# Patient Record
Sex: Female | Born: 1935
Health system: Southern US, Community
[De-identification: ages and names within clinical notes are randomized; demographics above are authoritative.]

## PROBLEM LIST (undated history)

## (undated) DIAGNOSIS — G4700 Insomnia, unspecified: Secondary | ICD-10-CM

## (undated) DIAGNOSIS — E049 Nontoxic goiter, unspecified: Secondary | ICD-10-CM

## (undated) DIAGNOSIS — J302 Other seasonal allergic rhinitis: Secondary | ICD-10-CM

## (undated) DIAGNOSIS — K8020 Calculus of gallbladder without cholecystitis without obstruction: Secondary | ICD-10-CM

## (undated) DIAGNOSIS — F329 Major depressive disorder, single episode, unspecified: Secondary | ICD-10-CM

## (undated) DIAGNOSIS — F32A Depression, unspecified: Secondary | ICD-10-CM

## (undated) DIAGNOSIS — M8580 Other specified disorders of bone density and structure, unspecified site: Secondary | ICD-10-CM

## (undated) DIAGNOSIS — E785 Hyperlipidemia, unspecified: Secondary | ICD-10-CM

## (undated) DIAGNOSIS — K7689 Other specified diseases of liver: Secondary | ICD-10-CM

## (undated) DIAGNOSIS — I10 Essential (primary) hypertension: Secondary | ICD-10-CM

## (undated) DIAGNOSIS — Z872 Personal history of diseases of the skin and subcutaneous tissue: Secondary | ICD-10-CM

## (undated) DIAGNOSIS — D649 Anemia, unspecified: Secondary | ICD-10-CM

## (undated) DIAGNOSIS — E538 Deficiency of other specified B group vitamins: Secondary | ICD-10-CM

## (undated) DIAGNOSIS — G3184 Mild cognitive impairment, so stated: Secondary | ICD-10-CM

## (undated) DIAGNOSIS — E46 Unspecified protein-calorie malnutrition: Secondary | ICD-10-CM

## (undated) DIAGNOSIS — J449 Chronic obstructive pulmonary disease, unspecified: Secondary | ICD-10-CM

## (undated) DIAGNOSIS — E039 Hypothyroidism, unspecified: Secondary | ICD-10-CM

## (undated) DIAGNOSIS — K56609 Unspecified intestinal obstruction, unspecified as to partial versus complete obstruction: Secondary | ICD-10-CM

## (undated) DIAGNOSIS — Z8719 Personal history of other diseases of the digestive system: Secondary | ICD-10-CM

## (undated) DIAGNOSIS — K8000 Calculus of gallbladder with acute cholecystitis without obstruction: Principal | ICD-10-CM

## (undated) HISTORY — DX: Chronic obstructive pulmonary disease, unspecified: J44.9

## (undated) HISTORY — DX: Deficiency of other specified B group vitamins: E53.8

## (undated) HISTORY — DX: Calculus of gallbladder without cholecystitis without obstruction: K80.20

## (undated) HISTORY — PX: DILATION AND CURETTAGE OF UTERUS: SHX78

## (undated) HISTORY — DX: Nontoxic goiter, unspecified: E04.9

## (undated) HISTORY — PX: CATARACT EXTRACTION W/ INTRAOCULAR LENS IMPLANT: SHX1309

## (undated) HISTORY — DX: Anemia, unspecified: D64.9

## (undated) HISTORY — DX: Unspecified protein-calorie malnutrition: E46

## (undated) HISTORY — DX: Unspecified intestinal obstruction, unspecified as to partial versus complete obstruction: K56.609

## (undated) HISTORY — DX: Mild cognitive impairment of uncertain or unknown etiology: G31.84

## (undated) HISTORY — DX: Personal history of diseases of the skin and subcutaneous tissue: Z87.2

## (undated) HISTORY — DX: Insomnia, unspecified: G47.00

## (undated) HISTORY — DX: Other specified disorders of bone density and structure, unspecified site: M85.80

## (undated) HISTORY — DX: Other specified diseases of liver: K76.89

---

## 2011-05-08 ENCOUNTER — Emergency Department (HOSPITAL_COMMUNITY)
Admission: EM | Admit: 2011-05-08 | Discharge: 2011-05-09 | Disposition: A | Discharge status: Home / Self Care | Payer: Self-pay | Attending: Emergency Medicine | Admitting: Emergency Medicine

## 2011-05-08 DIAGNOSIS — Y92009 Unspecified place in unspecified non-institutional (private) residence as the place of occurrence of the external cause: Secondary | ICD-10-CM | POA: Insufficient documentation

## 2011-05-08 DIAGNOSIS — W010XXA Fall on same level from slipping, tripping and stumbling without subsequent striking against object, initial encounter: Secondary | ICD-10-CM | POA: Insufficient documentation

## 2011-05-08 DIAGNOSIS — I10 Essential (primary) hypertension: Secondary | ICD-10-CM | POA: Insufficient documentation

## 2011-05-08 DIAGNOSIS — Z79899 Other long term (current) drug therapy: Secondary | ICD-10-CM | POA: Insufficient documentation

## 2011-05-08 DIAGNOSIS — S51809A Unspecified open wound of unspecified forearm, initial encounter: Secondary | ICD-10-CM | POA: Insufficient documentation

## 2012-12-16 HISTORY — PX: ABCESS DRAINAGE: SHX399

## 2012-12-16 HISTORY — PX: COLON RESECTION: SHX5231

## 2013-09-15 DIAGNOSIS — Z8719 Personal history of other diseases of the digestive system: Secondary | ICD-10-CM

## 2013-09-15 HISTORY — DX: Personal history of other diseases of the digestive system: Z87.19

## 2013-10-01 ENCOUNTER — Encounter: Payer: Self-pay | Admitting: Endocrinology

## 2013-10-01 ENCOUNTER — Inpatient Hospital Stay (HOSPITAL_COMMUNITY)
Admission: AD | Admit: 2013-10-01 | Discharge: 2013-10-12 | DRG: 391 | Disposition: A | Discharge status: Home Health | Payer: BC Managed Care – PPO | Source: Ambulatory Visit | Attending: Endocrinology | Admitting: Endocrinology

## 2013-10-01 ENCOUNTER — Other Ambulatory Visit: Payer: Self-pay | Admitting: Endocrinology

## 2013-10-01 ENCOUNTER — Ambulatory Visit
Admission: RE | Admit: 2013-10-01 | Discharge: 2013-10-01 | Disposition: A | Discharge status: Home / Self Care | Payer: Medicare Other | Source: Ambulatory Visit | Attending: Endocrinology | Admitting: Endocrinology

## 2013-10-01 DIAGNOSIS — I959 Hypotension, unspecified: Secondary | ICD-10-CM | POA: Diagnosis not present

## 2013-10-01 DIAGNOSIS — K8000 Calculus of gallbladder with acute cholecystitis without obstruction: Secondary | ICD-10-CM | POA: Insufficient documentation

## 2013-10-01 DIAGNOSIS — R197 Diarrhea, unspecified: Secondary | ICD-10-CM | POA: Diagnosis not present

## 2013-10-01 DIAGNOSIS — K630 Abscess of intestine: Secondary | ICD-10-CM | POA: Diagnosis present

## 2013-10-01 DIAGNOSIS — F411 Generalized anxiety disorder: Secondary | ICD-10-CM | POA: Diagnosis present

## 2013-10-01 DIAGNOSIS — R634 Abnormal weight loss: Secondary | ICD-10-CM | POA: Insufficient documentation

## 2013-10-01 DIAGNOSIS — R109 Unspecified abdominal pain: Secondary | ICD-10-CM

## 2013-10-01 DIAGNOSIS — K7689 Other specified diseases of liver: Secondary | ICD-10-CM

## 2013-10-01 DIAGNOSIS — E871 Hypo-osmolality and hyponatremia: Secondary | ICD-10-CM | POA: Diagnosis present

## 2013-10-01 DIAGNOSIS — F1010 Alcohol abuse, uncomplicated: Secondary | ICD-10-CM | POA: Diagnosis present

## 2013-10-01 DIAGNOSIS — K5732 Diverticulitis of large intestine without perforation or abscess without bleeding: Secondary | ICD-10-CM

## 2013-10-01 DIAGNOSIS — I10 Essential (primary) hypertension: Secondary | ICD-10-CM | POA: Diagnosis present

## 2013-10-01 DIAGNOSIS — K5740 Diverticulitis of both small and large intestine with perforation and abscess without bleeding: Secondary | ICD-10-CM | POA: Diagnosis present

## 2013-10-01 DIAGNOSIS — E876 Hypokalemia: Secondary | ICD-10-CM | POA: Diagnosis present

## 2013-10-01 DIAGNOSIS — R627 Adult failure to thrive: Secondary | ICD-10-CM | POA: Diagnosis present

## 2013-10-01 DIAGNOSIS — K5720 Diverticulitis of large intestine with perforation and abscess without bleeding: Secondary | ICD-10-CM | POA: Diagnosis present

## 2013-10-01 DIAGNOSIS — E43 Unspecified severe protein-calorie malnutrition: Secondary | ICD-10-CM | POA: Diagnosis present

## 2013-10-01 DIAGNOSIS — D649 Anemia, unspecified: Secondary | ICD-10-CM | POA: Diagnosis present

## 2013-10-01 DIAGNOSIS — E785 Hyperlipidemia, unspecified: Secondary | ICD-10-CM | POA: Diagnosis present

## 2013-10-01 DIAGNOSIS — G4700 Insomnia, unspecified: Secondary | ICD-10-CM | POA: Diagnosis present

## 2013-10-01 DIAGNOSIS — E039 Hypothyroidism, unspecified: Secondary | ICD-10-CM | POA: Diagnosis present

## 2013-10-01 DIAGNOSIS — A498 Other bacterial infections of unspecified site: Secondary | ICD-10-CM | POA: Diagnosis present

## 2013-10-01 DIAGNOSIS — N83209 Unspecified ovarian cyst, unspecified side: Secondary | ICD-10-CM | POA: Diagnosis present

## 2013-10-01 DIAGNOSIS — F17200 Nicotine dependence, unspecified, uncomplicated: Secondary | ICD-10-CM | POA: Diagnosis present

## 2013-10-01 DIAGNOSIS — N730 Acute parametritis and pelvic cellulitis: Secondary | ICD-10-CM

## 2013-10-01 DIAGNOSIS — K8010 Calculus of gallbladder with chronic cholecystitis without obstruction: Secondary | ICD-10-CM | POA: Diagnosis present

## 2013-10-01 DIAGNOSIS — Z681 Body mass index (BMI) 19 or less, adult: Secondary | ICD-10-CM

## 2013-10-01 DIAGNOSIS — E049 Nontoxic goiter, unspecified: Secondary | ICD-10-CM | POA: Insufficient documentation

## 2013-10-01 HISTORY — DX: Hyperlipidemia, unspecified: E78.5

## 2013-10-01 HISTORY — DX: Other seasonal allergic rhinitis: J30.2

## 2013-10-01 HISTORY — DX: Hypothyroidism, unspecified: E03.9

## 2013-10-01 HISTORY — DX: Other specified diseases of liver: K76.89

## 2013-10-01 HISTORY — DX: Calculus of gallbladder with acute cholecystitis without obstruction: K80.00

## 2013-10-01 LAB — COMPREHENSIVE METABOLIC PANEL
ALT: 23 U/L (ref 0–35)
AST: 32 U/L (ref 0–37)
Albumin: 2.1 g/dL — ABNORMAL LOW (ref 3.5–5.2)
Alkaline Phosphatase: 72 U/L (ref 39–117)
BUN: 10 mg/dL (ref 6–23)
CO2: 24 mEq/L (ref 19–32)
Calcium: 8 mg/dL — ABNORMAL LOW (ref 8.4–10.5)
Chloride: 91 mEq/L — ABNORMAL LOW (ref 96–112)
Creatinine, Ser: 0.53 mg/dL (ref 0.50–1.10)
GFR calc Af Amer: >90 mL/min (ref >90)
GFR calc non Af Amer: 90 mL/min — ABNORMAL LOW (ref >90)
Glucose, Bld: 78 mg/dL (ref 70–99)
Potassium: 2.6 mEq/L — CL (ref 3.5–5.1)
Sodium: 131 mEq/L — ABNORMAL LOW (ref 135–145)
Total Bilirubin: 0.6 mg/dL (ref 0.3–1.2)
Total Protein: 5.6 g/dL — ABNORMAL LOW (ref 6.0–8.3)

## 2013-10-01 LAB — CBC
HCT: 32.6 % — ABNORMAL LOW (ref 36.0–46.0)
Hemoglobin: 11.5 g/dL — ABNORMAL LOW (ref 12.0–15.0)
MCH: 34 pg (ref 26.0–34.0)
MCHC: 35.3 g/dL (ref 30.0–36.0)
MCV: 96.4 fL (ref 78.0–100.0)
Platelets: 328 10*3/uL (ref 150–400)
RBC: 3.38 MIL/uL — ABNORMAL LOW (ref 3.87–5.11)
RDW: 12.9 % (ref 11.5–15.5)
WBC: 9.3 10*3/uL (ref 4.0–10.5)

## 2013-10-01 MED ORDER — IOHEXOL 300 MG/ML  SOLN
100.0000 mL | Freq: Once | INTRAMUSCULAR | Status: AC | PRN
  Administered 2013-10-01: 100 mL via INTRAVENOUS

## 2013-10-01 MED ORDER — TEMAZEPAM 7.5 MG PO CAPS
7.5000 mg | ORAL_CAPSULE | Freq: Every evening | ORAL | Status: AC | PRN
  Administered 2013-10-01 – 2013-10-02 (×2): 7.5 mg via ORAL
  Filled 2013-10-01 (×2): qty 1

## 2013-10-01 MED ORDER — BUSPIRONE HCL 10 MG PO TABS
10.0000 mg | ORAL_TABLET | Freq: Two times a day (BID) | ORAL | Status: DC
  Administered 2013-10-01 – 2013-10-11 (×20): 10 mg via ORAL
  Filled 2013-10-01 (×28): qty 1

## 2013-10-01 MED ORDER — POTASSIUM CHLORIDE 10 MEQ/100ML IV SOLN
10.0000 meq | INTRAVENOUS | Status: AC
  Administered 2013-10-01 (×3): 10 meq via INTRAVENOUS
  Filled 2013-10-01 (×3): qty 100

## 2013-10-01 MED ORDER — PIPERACILLIN-TAZOBACTAM 3.375 G IVPB
3.3750 g | Freq: Three times a day (TID) | INTRAVENOUS | Status: DC
  Administered 2013-10-01 – 2013-10-07 (×18): 3.375 g via INTRAVENOUS
  Filled 2013-10-01 (×19): qty 50

## 2013-10-01 MED ORDER — IOHEXOL 300 MG/ML  SOLN
40.0000 mL | Freq: Once | INTRAMUSCULAR | Status: AC | PRN
  Administered 2013-10-01: 40 mL via ORAL

## 2013-10-01 MED ORDER — SODIUM CHLORIDE 0.9 % IV SOLN
INTRAVENOUS | Status: DC
  Administered 2013-10-01 – 2013-10-04 (×6): via INTRAVENOUS
  Administered 2013-10-05: 20 mL/h via INTRAVENOUS
  Administered 2013-10-08 – 2013-10-09 (×5): via INTRAVENOUS

## 2013-10-01 MED ORDER — LEVOTHYROXINE SODIUM 75 MCG PO TABS
75.0000 ug | ORAL_TABLET | Freq: Every day | ORAL | Status: DC
  Administered 2013-10-02 – 2013-10-12 (×11): 75 ug via ORAL
  Filled 2013-10-01 (×13): qty 1

## 2013-10-01 MED ORDER — LOSARTAN POTASSIUM 50 MG PO TABS
50.0000 mg | ORAL_TABLET | Freq: Every day | ORAL | Status: DC
  Administered 2013-10-02 – 2013-10-06 (×5): 50 mg via ORAL
  Filled 2013-10-01 (×7): qty 1

## 2013-10-01 MED ORDER — ZOLPIDEM TARTRATE 5 MG PO TABS: 5.0000 mg | ORAL_TABLET | Freq: Every evening | ORAL | Status: DC | PRN

## 2013-10-01 NOTE — Progress Notes (Signed)
Arrived to room 6n12 by wc with husband. A/Ox4, denies pain/nausea, oriented to room and surroundings.

## 2013-10-01 NOTE — Progress Notes (Signed)
CRITICAL VALUE ALERT  Critical value received:  K+ 2.6  Date of notification:  10/01/2013  Time of notification:  1925  Critical value read back: yes  Nurse who received alert:  Geannie Risen  MD notified (1st page):  Dr. Larina Earthly  Time of first page:  1930  MD notified (2nd page):  Time of second page:  Responding MD:  Dr. Larina Earthly  Time MD responded:  [phone]

## 2013-10-01 NOTE — Consult Note (Signed)
Reason for Consult:pelvic abscess, gallbladder disease Referring Physician: Dr. Rinaldo Cloud  [name] is an 78 y.o. female.  HPI: I have been asked to evaluate this patient after a CAT scan of the abdomen and pelvis demonstrated multiple pelvic abscesses as well as findings and the gallbladder worrisome for gallbladder cancer. The patient has felt poorly over the last 3 weeks. She's had constant mild sharp abdominal pain across her lower abdomen. This came on gradually. She has had some nausea but no emesis. She is passing flatus and moving her bowels. According to her and her husband, she has felt poorly for almost a year. She originally lost a large amount of weight but has been slowly over time able to regain the weight. She has no prior history of diverticulitis. She has never had a colonoscopy per her report. She denies fevers or chills.  No past medical history on file.  No past surgical history on file.No past surgical history  No family history on file.  Social History:  has no tobacco, alcohol, and drug history on file.  Allergies:  Allergies  Allergen Reactions  . Cymbalta [Duloxetine Hcl] Other (See Comments)    "kinda went nuts"  . Halcion [Triazolam] Other (See Comments)    "kinda went nuts"    Medications: I have reviewed the patient's current medications.  Results for orders placed during the hospital encounter of 10/01/13 (from the past 48 hour(s))  CBC     Status: Abnormal   Collection Time    10/01/13  6:28 PM      Result Value Range   WBC 9.3  4.0 - 10.5 K/uL   RBC 3.38 (*) 3.87 - 5.11 MIL/uL   Hemoglobin 11.5 (*) 12.0 - 15.0 g/dL   HCT 16.1 (*) 09.6 - 04.5 %   MCV 96.4  78.0 - 100.0 fL   MCH 34.0  26.0 - 34.0 pg   MCHC 35.3  30.0 - 36.0 g/dL   RDW 40.9  81.1 - 91.4 %   Platelets 328  150 - 400 K/uL  COMPREHENSIVE METABOLIC PANEL     Status: Abnormal   Collection Time    10/01/13  6:28 PM      Result Value Range   Sodium 131 (*) 135 - 145 mEq/L   Potassium 2.6 (*) 3.5 - 5.1 mEq/L   Comment: CRITICAL RESULT CALLED TO, READ BACK BY AND VERIFIED WITH:     N CAGUIOA,RN 1920 10/01/13 D BRADLEY   Chloride 91 (*) 96 - 112 mEq/L   CO2 24  19 - 32 mEq/L   Glucose, Bld 78  70 - 99 mg/dL   BUN 10  6 - 23 mg/dL   Creatinine, Ser 7.82  0.50 - 1.10 mg/dL   Calcium 8.0 (*) 8.4 - 10.5 mg/dL   Total Protein 5.6 (*) 6.0 - 8.3 g/dL   Albumin 2.1 (*) 3.5 - 5.2 g/dL   AST 32  0 - 37 U/L   ALT 23  0 - 35 U/L   Alkaline Phosphatase 72  39 - 117 U/L   Total Bilirubin 0.6  0.3 - 1.2 mg/dL   GFR calc non Af Amer 90 (*) >90 mL/min   GFR calc Af Amer >90  >90 mL/min   Comment: (NOTE)     The eGFR has been calculated using the CKD EPI equation.     This calculation has not been validated in all clinical situations.     eGFR's persistently <90 mL/min signify possible  Chronic Kidney     Disease.    Ct Abdomen Pelvis W Contrast  10/01/2013   CLINICAL DATA:  Mid abdominal pain and 10 lb weight loss.  BUN and creatinine were obtained on site at [facility] at [address]Results: BUN 8.0 mg/dL, Creatinine 0.6 mg/dL.  EXAM: CT ABDOMEN AND PELVIS WITH CONTRAST  TECHNIQUE: Multidetector CT imaging of the abdomen and pelvis was performed using the standard protocol following bolus administration of intravenous contrast.  CONTRAST:  OMNIPAQUE IOHEXOL 300 MG/ML SOLN, 40mL OMNIPAQUE IOHEXOL 300 MG/ML SOLN  COMPARISON:  03/14/2009.  FINDINGS: The lung bases are clear. No pleural effusion or pulmonary nodules. The heart is normal in size. No pericardial effusion.  The gallbladder is grossly abnormal. There are numerous small gallstones surrounded by a thickened gallbladder wall with cystic change. The gallbladder wall adjacent to the liver is somewhat irregular and on the liver windows there appears to be some infiltrative change in the liver. FINDINGS all are worrisome for gallbladder carcinoma with early liver INDICATION. There are multiple benign  appearing hepatic cysts which are stable when compared to a prior CT scan. The common bile duct is normal in caliber. The pancreas is normal. The spleen is normal. There is a small calcified splenic artery aneurysm noted. The adrenal glands are unremarkable. There is a stable left adrenal gland adenoma. The kidneys are normal.  The stomach, duodenum, small bowel and colon are grossly normal. Cannot definitely exclude involvement of the splenic flexure or 2nd portion of the duodenum on this examination. There are small scattered mesenteric and retroperitoneal lymph nodes but no overt adenopathy.  In the pelvis there are multiple pelvic abscesses. The largest abscesses in the deep right pelvis adjacent to the sigmoid colon and measures a maximum of 7 cm. Smaller abscesses are more anteriorly and just above the uterus. This is likely from perforated diverticulitis. The uterus and ovaries are grossly normal. The bladder is normal. No inguinal mass or adenopathy.  The bony structures are unremarkable. Moderate degenerative changes. There are smaller abscesses more superiorly. The uterus and ovaries are grossly normal.  IMPRESSION: 1. CT findings worrisome for gallbladder carcinoma likely E invading the adjacent liver. Could not exclude the possibility of involvement of the 2nd portion of the duodenum and the splenic flexure region of the colon MRI abdomen without and with contrast is recommended for further evaluation of this process. No common bile duct dilatation. 2. Multiple benign appearing and stable hepatic cysts. 3. Multiple pelvic abscesses likely due to perforated diverticulitis.  4. Critical Value/emergent results were called by telephone at the time of interpretation on 10/01/2013 at 3:02 PM to Dr.STEPHEN SOUTH .   Electronically Signed   By: Loralie Champagne M.D.   On: 10/01/2013 15:03    Review of Systems  Constitutional: Positive for weight loss and malaise/fatigue. Negative for fever and chills.  Eyes:  Negative.   Respiratory: Negative.   Cardiovascular: Negative.   Gastrointestinal: Positive for nausea and abdominal pain. Negative for vomiting, diarrhea and blood in stool.  Genitourinary: Negative.   Musculoskeletal: Negative.   Skin: Negative.   Neurological: Positive for weakness.  Endo/Heme/Allergies: Negative.   Psychiatric/Behavioral: Negative.    Blood pressure 119/65, pulse 88, temperature 98.2 F (36.8 C), temperature source Oral, resp. rate 16, height 5' 2.5" (1.588 m), weight 103 lb (46.72 kg), SpO2 98.00%. Physical Exam  Constitutional: She is oriented to person, place, and time.  thin female  HENT:  Head: Normocephalic and atraumatic.  Right Ear: External ear normal.  Left Ear: External ear normal.  Nose: Nose normal.  Mouth/Throat: Oropharynx is clear and moist. No oropharyngeal exudate.  Eyes: Conjunctivae are normal. Pupils are equal, round, and reactive to light. Right eye exhibits no discharge. Left eye exhibits no discharge. No scleral icterus.  Neck: Normal range of motion. Neck supple. No tracheal deviation present.  Cardiovascular: Normal rate, regular rhythm, normal heart sounds and intact distal pulses.   No murmur heard. Respiratory: Effort normal and breath sounds normal. No respiratory distress. She has no wheezes. She has no rales.  GI: Soft. Bowel sounds are normal. She exhibits no distension and no mass. There is no tenderness. There is no rebound and no guarding.  Mildly protuberant abdomen  Musculoskeletal: Normal range of motion. She exhibits no edema and no tenderness.  Lymphadenopathy:    She has no cervical adenopathy.  Neurological: She is alert and oriented to person, place, and time.  Skin: Skin is warm and dry. No rash noted. No erythema.  Psychiatric: Her behavior is normal. Judgment normal.    Assessment/Plan: Pelvic abscesses suspected to be from diverticulitis as well as the possibility of gallbladder carcinoma.  She has been  started on IV antibiotics. Her abdominal exam is fairly benign with no palpable masses and minimal discomfort. I agree with consult interventional radiology for possible percutaneous drainage of the abscesses. Further workup of the gallbladder will probably be necessary via MRI. I will discuss this with Dr. Donell Beers who is our surgical oncologist in the morning.  Zaiah Credeur A 10/01/2013, 7:29 PM

## 2013-10-01 NOTE — Progress Notes (Signed)
Pt now settled into a room. IR unavailabel (6:50PM). Will let [facility] know about arrival Labs pending. Pt non toxic and comfortable at the present  Temp 98.2 F (36.8 C) 98.2 F (36.8 C) Pulse Rate 88 88 Resp 16 16 BP: Systolic 119 mmHg 119 mmHg BP: Diastolic 65 mmHg 65 mmHg SpO2 98 % 98 %  Exam same as yesterday  Assunta Curtis MD [facility]

## 2013-10-01 NOTE — Progress Notes (Signed)
ANTIBIOTIC CONSULT NOTE - INITIAL  Pharmacy Consult for Zosyn Indication: Pelvic abscess  Allergies not on file  Patient Measurements:   Adjusted Body Weight:    Vital Signs: Temp: 98.2 F (36.8 C) (10/17 1738) Temp src: Oral (10/17 1738) BP: 119/65 mmHg (10/17 1738) Pulse Rate: 88 (10/17 1738) Intake/Output from previous day:   Intake/Output from this shift:    Labs: No results found for this basename: WBC, HGB, PLT, LABCREA, CREATININE,  in the last 72 hours CrCl is unknown because no creatinine reading has been taken and the patient has no height on file. No results found for this basename: VANCOTROUGH, VANCOPEAK, VANCORANDOM, GENTTROUGH, GENTPEAK, GENTRANDOM, TOBRATROUGH, TOBRAPEAK, TOBRARND, AMIKACINPEAK, AMIKACINTROU, AMIKACIN,  in the last 72 hours   Microbiology: No results found for this or any previous visit (from the past 720 hour(s)).  Medical History: No past medical history on file.  Medications:  1) Cozaar 50 Mg Tabs (Losartan potassium) .... Take one tablet daily  2) Loratadine 10 Mg Tabs (Loratadine) .... Po qd  3) Flonase 50 Mcg/act Susp (Fluticasone propionate) .... 2 sprays both nostril once per day  4) Nasonex 50 Mcg/act Susp (Mometasone furoate) .... Use once daily as needed  5) Vitamin D 1000 Unit Caps (Cholecalciferol) .Marland Kitchen.. 1 cap po qd  6) Buspirone Hcl 10 Mg Tabs (Buspirone hcl) .... Bid  7) Synthroid 75 Mcg Tabs (Levothyroxine sodium) .... Take one tablet by mouth every day  8) Atenolol-chlorthalidone 50-25 Mg Tabs (Atenolol-chlorthalidone) .... Take one tablet once daily as directed  9) Sonata 10 Mg Caps (Zaleplon) .... Take one capsule at bedtime  10) Vytorin 10-80 Mg Tabs (Ezetimibe-simvastatin) .Marland Kitchen.. 1 tab po qd  11) Klor-con 20 Meq Pack (Potassium chloride) .... Take three times daily then two times daily     Assessment: Pain across abdomen with anorexia. Patient found to have diverticular pelvic acscess, abnormal gallbladder with possible  liver involvement.  Labs: K=2.6. Scr 0.53 with estimated CrCl 44. AST/ALT WNL. WBC=9.3. Hgb slightly low at 11.5   PMH: Hypernatremia, HTN, goiter, HLD, hematuria, osteopenia, +tobacco   Goal of Therapy:  abx for divertuclar abscess  Plan:  Stat labs Zosyn 3.375g IV q8hrs.   [name], PharmD, BCPS Clinical Staff Pharmacist Pager [phone]  [name] 10/01/2013,5:47 PM

## 2013-10-01 NOTE — H&P (Signed)
PCP:   Assunta Curtis   Chief Complaint:  Weakness Pt seen late yesterday, now a call with the CT report listed below Still weak. Will admit with IR and Genl Surg seeing to try to resolve the absesses and look at GB issue MRI rec by radiology WIll hydrate  HPI:  Vital Signs  Entered weight:  103  lbs., Calculated Weight: 103 lbs., ( 46.72 kg) Height: 62.5 in., ( 158.75 cm) Pulse rate: 72 Pulse rhythm: regular Respirations: 14  Blood Pressure #1: 92 / 60 mm Hg    BMI: 18.54 BSA: 1.45 Wt Chg: -8.25 lbs since 08/24/2013  Vitals entered by: Carma Lair  on September 30, 2013 4:16 PM     PHQ-2 Depression Screening   1. During the past month, have you often been bothered by feeling down, depressed, or hopeless?  more than half the days 2. During the past month, have you often been bothered by little interest or pleasure in doing things? more than half the days SCORE:  4  Falls Assessment  During the past 12 months, have you fallen 2 or more times? Yes Have you been injured due to a fall? No   Risk Factors:   Smoked Tobacco Use:  Current every day smoker    Cigarettes:  Yes -- <1 pack(s) per day,      Year started:  college    Counseled :  yes Passive smoke exposure:  no Drug use:  no HIV high-risk behavior:  no Caffeine use:  <1 coffee drinks per day Exercise:  no Seatbelt use:  90 % Sun Exposure:  occasionally  Previous Tobacco Use: Signed On - 08/24/2013 Smoked Tobacco Use:  Current every day smoker    Cigarettes:  Yes -- <1 pack(s) per day,      Year started:  college    Counseled :  yes Passive smoke exposure:  no Drug use:  no HIV high-risk behavior:  no Caffeine use:  <1 coffee drinks per day  Previous Alcohol Use:  Exercise:  no Seatbelt use:  90 % Sun Exposure:  occasionally  Colonoscopy History:    Date of Last Colonoscopy:  08/12/2012  Mammogram History:    Date of Last Mammogram:  08/12/2012  PAP Smear History:    Date of Last PAP Smear:   08/12/2012  History of Present Illness  History from: patient Reason for visit: See chief complaint Chief Complaint: Patient complains of GI issues X 10 days. No fever. Doesn't feel well. No eating or drinking. Lying in bed all day.  History of Present Illness: Got almost back to normal with weight and everything else now 3 weeks.. Pain across mid abdomen. Not all the time in bed for 10 day no help with eating but no worse alternated diarrhea and constipation no blood seen.  Not eating or drinking much. mostly liquids some vomiting.   now hoarse since this AM   Review of Systems  General:       Complains of anorexia, fatigue, malaise.        Denies fevers, chills, headache, sweats, malaise.   Eyes:       Denies blurring, irritation, vision loss, photophobia.   Ears/Nose/Throat:       Complains of sore throat, hoarseness.        Denies dysphagia.   Cardiovascular:       Denies chest pains, palpitations, dyspnea on exertion, peripheral edema.   Respiratory:       Complains of cough.  Denies dyspnea, wheezing.   Gastrointestinal:       Complains of nausea, vomiting, diarrhea, constipation, abdominal pain.        Denies melena, hematochezia, jaundice.   Genitourinary:       Denies hematuria, nocturia, urinary frequency.   Musculoskeletal:       Denies back pain, stiffness, arthritis.   Skin:       Denies rash, itching, dryness.   Neurologic:       Complains of gait instability.        Denies tremors, vertigo, dizziness.   Psychiatric:       Complains of depression.        Denies anxiety.   Endocrine:       Complains of cold intolerance.        Denies heat intolerance.   Heme/Lymphatic:       Denies abnormal bruising, bleeding.    Past History Past Medical History (reviewed - no changes required): Gallstones Hypernutremia HTN Goiter Hyperlipidemia Hematuria Osteopenia  Surgical History (reviewed - no changes required): D + C Childbirth Family History  (reviewed - no changes required): Father - deceased prostate ca Mother deceased at 75 of old age was a single child  Social History (reviewed - no changes required): Married 1960 3 children, 10 GC, no GGC smokes 5-6 cigs a day alcohol, wine, 2 glasses a day  Family History Summary:     Reviewed history Last on 08/24/2013 and no changes required:10/01/2013 Father Baker Pierini.) - Has Family History of Other Cancer - Entered On: 09/30/2013  General Comments - FH: Father - deceased prostate ca Mother deceased at 64 of old age was a single child   Social History:    Reviewed history from 08/12/2012 and no changes required:       Married 1960       3 children, 10 GC, no GGC       smokes 5-6 cigs a day       alcohol, wine, 2 glasses a day   Physical Exam  General appearance: thin and uncomfortable  Eyes  External: aniciteric  Ears, Nose and Throat  External ears: normal, no lesions or deformities External nose: normal, no lesions or deformities Pharynx: sl dry  Neck  Neck: supple, no masses, trachea midline Thyroid: no nodules, masses, tenderness, or enlargement  Respiratory  Respiratory effort: no intercostal retractions or use of accessory muscles Auscultation: no rales, rhonchi, or wheezes  Cardiovascular  Auscultation: regular with sys murmur, a bit fast Carotid arteries: pulses 2+, symmetric, no bruits Pedal pulses: diminished Periph. circulation: no cyanosis, clubbing, edema  Gastrointestinal  Abdomen: scaphoid, sl tender in lower right quadrant, good BS;s. no rebound, cant feel a mass  Lymphatic  Neck: no cervical adenopathy Axillae: no axillary adenopathy  Musculoskeletal  Gait and station: slow and a bit unsteady Digits and nails: no clubbing, cyanosis, petechiae, or nodes  Skin  Inspection: no rashes, lesions, no pigmentary changes Speech: Normal  Mental Status Exam  Judgment, insight: intact Orientation: oriented to time, place, and person Memory:  intact Mood and affect: a bit reserved Mentation: intact   Impression & Recommendations:  Problem # 1:  Weight loss (ICD-783.21) (ICD10-R63.4) down an additional 8 lbs  Complete Medication List: 1)  Cozaar 50 Mg Tabs (Losartan potassium) .... Take one tablet daily 2)  Loratadine 10 Mg Tabs (Loratadine) .... Po qd 3)  Flonase 50 Mcg/act Susp (Fluticasone propionate) .... 2 sprays both nostril once per day 4)  Nasonex 50 Mcg/act  Susp (Mometasone furoate) .... Use once daily as needed 5)  Vitamin D 1000 Unit Caps (Cholecalciferol) .Marland Kitchen.. 1 cap po qd 6)  Buspirone Hcl 10 Mg Tabs (Buspirone hcl) .... Bid 7)  Synthroid 75 Mcg Tabs (Levothyroxine sodium) .... Take one tablet by mouth every day 8)  Atenolol-chlorthalidone 50-25 Mg Tabs (Atenolol-chlorthalidone) .... Take one tablet once daily as directed 9)  Sonata 10 Mg Caps (Zaleplon) .... Take one capsule at bedtime 10)  Vytorin 10-80 Mg Tabs (Ezetimibe-simvastatin) .Marland Kitchen.. 1 tab po qd 11)  Klor-con 20 Meq Pack (Potassium chloride) .... Take three times daily then two times daily  Other Orders: ANNUAL DEPRESSION SCREENING  (CPT-G0444) Screening for clinical depression is documented as being Positive (W4132) CMET (SMA 12) ([id number]) Venipuncture ([id number]) CBC/Platelets (KVQ-25956) LIPASE (lipase) CT Abdomen and Pelvis W/Contrast (CTAPW)  [Disposition-GMA] ] [MU CORE Checklist]      Medications: Prior to Admission medications   Complete Medication List: 1)  Cozaar 50 Mg Tabs (Losartan potassium) .... Take one tablet daily 2)  Loratadine 10 Mg Tabs (Loratadine) .... Po qd 3)  Flonase 50 Mcg/act Susp (Fluticasone propionate) .... 2 sprays both nostril once per day 4)  Nasonex 50 Mcg/act Susp (Mometasone furoate) .... Use once daily as needed 5)  Vitamin D 1000 Unit Caps (Cholecalciferol) .Marland Kitchen.. 1 cap po qd 6)  Buspirone Hcl 10 Mg Tabs (Buspirone hcl) .... Bid 7)  Synthroid 75 Mcg Tabs (Levothyroxine sodium) .... Take one tablet by  mouth every day 8)  Atenolol-chlorthalidone 50-25 Mg Tabs (Atenolol-chlorthalidone) .... Take one tablet once daily as directed 9)  Sonata 10 Mg Caps (Zaleplon) .... Take one capsule at bedtime 10)  Vytorin 10-80 Mg Tabs (Ezetimibe-simvastatin) .Marland Kitchen.. 1 tab po qd 11)  Klor-con 20 Meq Pack (Potassium chloride) .... Take three times daily then two times daily  Allergies:  halcion and cymbalta  Social History: Social History (reviewed - no changes required): Married 1960 3 children, 10 GC, no GGC smokes 5-6 cigs a day alcohol, wine, 2 glasses a day  Family History: Family History (reviewed - no changes required): Father - deceased prostate ca Mother deceased at 63 of old age was a single child      Labs on Admission:    Lipase, Serum             12 U/L                      0-59  Tests: (2) CBC (2000)   WBC                  [H]  14.60 K/uL                  4.10-10.90   LYM                       2.3 K/uL                    0.6-4.1 ! MID                       1.5 K/uL                    0.0-1.8   GRAN                 [H]  10.8 K/uL  2.0-7.8   LYM%                      16.0 %                      10.0-58.5 ! MID%                      10.3 %                      0.1-24.0   GRAN%                     73.7                        37.0-92.0   RBC                  [L]  3.9 M/uL                    4.2-6.3   HGB                       13.7 g/dL                   16.1-09.6   HCT                       41.3 %                      37.0-51.0   MCV                  [H]  105.9 fL                    80.0-97.0   MCH                  [H]  35.1 pg                     26.0-32.0   MCHC                      33.2 g/dL                   04.5-40.9   PLT                  [H]  453 K/uL                    140-440        Radiological Exams on Admission: Ct Abdomen Pelvis W Contrast  10/01/2013   CLINICAL DATA:  Mid abdominal pain and 10 lb weight loss.  BUN and creatinine were obtained  on site at [facility] at [address]Results: BUN 8.0 mg/dL, Creatinine 0.6 mg/dL.  EXAM: CT ABDOMEN AND PELVIS WITH CONTRAST  TECHNIQUE: Multidetector CT imaging of the abdomen and pelvis was performed using the standard protocol following bolus administration of intravenous contrast.  CONTRAST:  OMNIPAQUE IOHEXOL 300 MG/ML SOLN, 40mL OMNIPAQUE IOHEXOL 300 MG/ML SOLN  COMPARISON:  03/14/2009.  FINDINGS: The lung bases are clear. No pleural effusion or pulmonary nodules. The heart is normal in size. No pericardial effusion.  The gallbladder is grossly abnormal. There are numerous small gallstones surrounded by a  thickened gallbladder wall with cystic change. The gallbladder wall adjacent to the liver is somewhat irregular and on the liver windows there appears to be some infiltrative change in the liver. FINDINGS all are worrisome for gallbladder carcinoma with early liver INDICATION. There are multiple benign appearing hepatic cysts which are stable when compared to a prior CT scan. The common bile duct is normal in caliber. The pancreas is normal. The spleen is normal. There is a small calcified splenic artery aneurysm noted. The adrenal glands are unremarkable. There is a stable left adrenal gland adenoma. The kidneys are normal.  The stomach, duodenum, small bowel and colon are grossly normal. Cannot definitely exclude involvement of the splenic flexure or 2nd portion of the duodenum on this examination. There are small scattered mesenteric and retroperitoneal lymph nodes but no overt adenopathy.  In the pelvis there are multiple pelvic abscesses. The largest abscesses in the deep right pelvis adjacent to the sigmoid colon and measures a maximum of 7 cm. Smaller abscesses are more anteriorly and just above the uterus. This is likely from perforated diverticulitis. The uterus and ovaries are grossly normal. The bladder is normal. No inguinal mass or adenopathy.  The bony structures are  unremarkable. Moderate degenerative changes. There are smaller abscesses more superiorly. The uterus and ovaries are grossly normal.  IMPRESSION: 1. CT findings worrisome for gallbladder carcinoma likely E invading the adjacent liver. Could not exclude the possibility of involvement of the 2nd portion of the duodenum and the splenic flexure region of the colon MRI abdomen without and with contrast is recommended for further evaluation of this process. No common bile duct dilatation. 2. Multiple benign appearing and stable hepatic cysts. 3. Multiple pelvic abscesses likely due to perforated diverticulitis.  4. Critical Value/emergent results were called by telephone at the time of interpretation on 10/01/2013 at 3:02 PM to Dr.Tritia Endo .   Electronically Signed   By: Loralie Champagne M.D.   On: 10/01/2013 15:03     Assessment/Plan DIVERTICULAR PELVIC ABSCESSES; a difficult issue. Will get surgery and IR to see. Hope to drain. Rx. Zosyn initially  ABNORMAL GB WITH POSSIBLE LIVER INVOLVMENT: will need MRI of abd HYPERTENSION: go with cozaar alone HYPOTHYROID: continue Rx ANXIETY:continue Rx  [name] ALAN 10/01/2013, 3:52 PM

## 2013-10-02 ENCOUNTER — Encounter (HOSPITAL_COMMUNITY): Payer: Self-pay | Admitting: Radiology

## 2013-10-02 ENCOUNTER — Inpatient Hospital Stay (HOSPITAL_COMMUNITY): Payer: BC Managed Care – PPO

## 2013-10-02 LAB — BASIC METABOLIC PANEL
BUN: 8 mg/dL (ref 6–23)
BUN: 7 mg/dL (ref 6–23)
CO2: 21 mEq/L (ref 19–32)
CO2: 20 mEq/L (ref 19–32)
Calcium: 7.7 mg/dL — ABNORMAL LOW (ref 8.4–10.5)
Calcium: 7.9 mg/dL — ABNORMAL LOW (ref 8.4–10.5)
Chloride: 97 mEq/L (ref 96–112)
Chloride: 102 mEq/L (ref 96–112)
Creatinine, Ser: 0.5 mg/dL (ref 0.50–1.10)
Creatinine, Ser: 0.58 mg/dL (ref 0.50–1.10)
GFR calc Af Amer: >90 mL/min (ref >90)
GFR calc Af Amer: >90 mL/min (ref >90)
GFR calc non Af Amer: >90 mL/min (ref >90)
GFR calc non Af Amer: 87 mL/min — ABNORMAL LOW (ref >90)
Glucose, Bld: 70 mg/dL (ref 70–99)
Glucose, Bld: 65 mg/dL — ABNORMAL LOW (ref 70–99)
Potassium: 3 mEq/L — ABNORMAL LOW (ref 3.5–5.1)
Potassium: 3.1 mEq/L — ABNORMAL LOW (ref 3.5–5.1)
Sodium: 135 mEq/L (ref 135–145)
Sodium: 139 mEq/L (ref 135–145)

## 2013-10-02 LAB — CBC
HCT: 32.8 % — ABNORMAL LOW (ref 36.0–46.0)
Hemoglobin: 11.4 g/dL — ABNORMAL LOW (ref 12.0–15.0)
MCH: 33.8 pg (ref 26.0–34.0)
MCHC: 34.8 g/dL (ref 30.0–36.0)
MCV: 97.3 fL (ref 78.0–100.0)
Platelets: 320 10*3/uL (ref 150–400)
RBC: 3.37 MIL/uL — ABNORMAL LOW (ref 3.87–5.11)
RDW: 12.9 % (ref 11.5–15.5)
WBC: 8.2 10*3/uL (ref 4.0–10.5)

## 2013-10-02 LAB — CEA: CEA: 1.4 ng/mL (ref 0.0–5.0)

## 2013-10-02 LAB — CANCER ANTIGEN 19-9: CA 19-9: 28.8 U/mL — ABNORMAL LOW (ref <35.0)

## 2013-10-02 MED ORDER — MIDAZOLAM HCL 2 MG/2ML IJ SOLN
INTRAMUSCULAR | Status: AC | PRN
  Administered 2013-10-02: 1 mg via INTRAVENOUS
  Administered 2013-10-02: 0.5 mg via INTRAVENOUS

## 2013-10-02 MED ORDER — HYDROMORPHONE HCL PF 1 MG/ML IJ SOLN
1.0000 mg | INTRAMUSCULAR | Status: DC | PRN
  Administered 2013-10-02 – 2013-10-07 (×19): 1 mg via INTRAVENOUS
  Filled 2013-10-02 (×19): qty 1

## 2013-10-02 MED ORDER — INFLUENZA VAC SPLIT QUAD 0.5 ML IM SUSP
0.5000 mL | INTRAMUSCULAR | Status: AC
  Administered 2013-10-03: 0.5 mL via INTRAMUSCULAR
  Filled 2013-10-02: qty 0.5

## 2013-10-02 MED ORDER — PNEUMOCOCCAL VAC POLYVALENT 25 MCG/0.5ML IJ INJ
0.5000 mL | INJECTION | INTRAMUSCULAR | Status: AC
  Administered 2013-10-03: 0.5 mL via INTRAMUSCULAR
  Filled 2013-10-02: qty 0.5

## 2013-10-02 MED ORDER — FENTANYL CITRATE 0.05 MG/ML IJ SOLN
INTRAMUSCULAR | Status: AC | PRN
  Administered 2013-10-02 (×2): 25 ug via INTRAVENOUS

## 2013-10-02 MED ORDER — LIDOCAINE HCL (PF) 1 % IJ SOLN
10.0000 mL | Freq: Once | INTRAMUSCULAR | Status: DC
  Filled 2013-10-02: qty 10

## 2013-10-02 MED ORDER — POTASSIUM CHLORIDE 10 MEQ/100ML IV SOLN
10.0000 meq | INTRAVENOUS | Status: AC
  Administered 2013-10-02 (×3): 10 meq via INTRAVENOUS
  Filled 2013-10-02 (×3): qty 100

## 2013-10-02 NOTE — Progress Notes (Signed)
Subjective: Patient resting comfortably after administration of Dilaudid times one, denies fevers, chills, is trying to reposition herself secondary to percutaneous drainage of a pelvic abscess, from which 80 cc of purulent fluid was removed, sent off for cultures. She continues on Zosyn, no apparent side effects from the same, has requested sleeping agents. Denies nausea, vomiting, abdominal discomfort, shortness of breath, chest pain, palpitations. Acknowledges findings on CT scan including concern over the area around the gallbladder.  Objective: Vital signs in last 24 hours: Temp:  [97.9 F (36.6 C)-98.5 F (36.9 C)] 98.3 F (36.8 C) (10/18 1500) Pulse Rate:  [67-88] 79 (10/18 1500) Resp:  [9-18] 16 (10/18 1500) BP: (94-122)/(45-59) 116/59 mmHg (10/18 1500) SpO2:  [97 %-100 %] 98 % (10/18 1500) Weight change:  Last BM Date: 10/02/13  CBG (last 3)  No results found for this basename: GLUCAP,  in the last 72 hours  Intake/Output from previous day:   Intake/Output this shift: Total I/O In: 2593.3 [P.O.:360; I.V.:2083.3; IV Piggyback:150] Out: 106 [Drains:80]  Frail, elderly female, no apparent distress, answering all questions appropriately,  Sclera anicteric, tongue midline, no oropharyngeal lesions, neck supple Lungs clear to auscultation without any evidence of respiratory distress, accessory muscle usage, rhonchi or wheezing Regular rate and rhythm, with mild murmur Abdomen, soft, nontender, nondistended, no guarding pres Extremity exam reveals no edema, clubbing, cyanosis, pedal pulses intact but somewhat diminished  Lab Results:  Recent Labs  10/01/13 2316 10/02/13 0500  NA 135 139  K 3.0* 3.1*  CL 97 102  CO2 21 20  GLUCOSE 70 65*  BUN 8 7  CREATININE 0.50 0.58  CALCIUM 7.7* 7.9*    Recent Labs  10/01/13 1828  AST 32  ALT 23  ALKPHOS 72  BILITOT 0.6  PROT 5.6*  ALBUMIN 2.1*    Recent Labs  10/01/13 1828 10/02/13 0500  WBC 9.3 8.2  HGB 11.5*  11.4*  HCT 32.6* 32.8*  MCV 96.4 97.3  PLT 328 320   No results found for this basename: CKTOTAL, CKMB, CKMBINDEX, TROPONINI,  in the last 72 hours No results found for this basename: TSH, T4TOTAL, FREET3, T3FREE, THYROIDAB,  in the last 72 hours No results found for this basename: VITAMINB12, FOLATE, FERRITIN, TIBC, IRON, RETICCTPCT,  in the last 72 hours  Studies/Results: Ct Abdomen Pelvis W Contrast  10/01/2013   CLINICAL DATA:  Mid abdominal pain and 10 lb weight loss.  BUN and creatinine were obtained on site at [facility] at [address]Results: BUN 8.0 mg/dL, Creatinine 0.6 mg/dL.  EXAM: CT ABDOMEN AND PELVIS WITH CONTRAST  TECHNIQUE: Multidetector CT imaging of the abdomen and pelvis was performed using the standard protocol following bolus administration of intravenous contrast.  CONTRAST:  OMNIPAQUE IOHEXOL 300 MG/ML SOLN, 40mL OMNIPAQUE IOHEXOL 300 MG/ML SOLN  COMPARISON:  03/14/2009.  FINDINGS: The lung bases are clear. No pleural effusion or pulmonary nodules. The heart is normal in size. No pericardial effusion.  The gallbladder is grossly abnormal. There are numerous small gallstones surrounded by a thickened gallbladder wall with cystic change. The gallbladder wall adjacent to the liver is somewhat irregular and on the liver windows there appears to be some infiltrative change in the liver. FINDINGS all are worrisome for gallbladder carcinoma with early liver INDICATION. There are multiple benign appearing hepatic cysts which are stable when compared to a prior CT scan. The common bile duct is normal in caliber. The pancreas is normal. The spleen is normal. There is a small  calcified splenic artery aneurysm noted. The adrenal glands are unremarkable. There is a stable left adrenal gland adenoma. The kidneys are normal.  The stomach, duodenum, small bowel and colon are grossly normal. Cannot definitely exclude involvement of the splenic flexure or 2nd portion of the  duodenum on this examination. There are small scattered mesenteric and retroperitoneal lymph nodes but no overt adenopathy.  In the pelvis there are multiple pelvic abscesses. The largest abscesses in the deep right pelvis adjacent to the sigmoid colon and measures a maximum of 7 cm. Smaller abscesses are more anteriorly and just above the uterus. This is likely from perforated diverticulitis. The uterus and ovaries are grossly normal. The bladder is normal. No inguinal mass or adenopathy.  The bony structures are unremarkable. Moderate degenerative changes. There are smaller abscesses more superiorly. The uterus and ovaries are grossly normal.  IMPRESSION: 1. CT findings worrisome for gallbladder carcinoma likely E invading the adjacent liver. Could not exclude the possibility of involvement of the 2nd portion of the duodenum and the splenic flexure region of the colon MRI abdomen without and with contrast is recommended for further evaluation of this process. No common bile duct dilatation. 2. Multiple benign appearing and stable hepatic cysts. 3. Multiple pelvic abscesses likely due to perforated diverticulitis.  4. Critical Value/emergent results were called by telephone at the time of interpretation on 10/01/2013 at 3:02 PM to Dr.STEPHEN SOUTH .   Electronically Signed   By: Loralie Champagne M.D.   On: 10/01/2013 15:03   Ct Image Guided Fluid Drain By Catheter  10/02/2013   CLINICAL DATA:  78 year old female with abdominal pain and the pelvic abscess on recent CT scan. She presents for percutaneous drainage.  EXAM: RADIOLOGY EXAMINATION  Date: 10/02/2013  TECHNIQUE: Informed consent was obtained from the patient following explanation of the procedure, risks, benefits and alternatives. The patient understands, agrees and consents for the procedure. All questions were addressed. A time out was performed.  Maximal barrier sterile technique utilized including caps, mask, sterile gowns, sterile gloves,  large sterile drape, hand hygiene, and the Betadine skin prep.  The patient was placed in the prone position. A planning axial CT scan was performed. A suitable skin entry site was selected and marked. Local anesthesia was attained by infiltration with 1% lidocaine. Under CT fluoroscopic guidance, an 18 gauge trocar needle was carefully advanced through the sacral spinous ligament and into the deep pelvic fluid collection. Aspiration through the needle return thick, foul-smelling purulent fluid. Wire was then coiled within the fluid collection and the tract serially dilated to 14 [place]. A Cook 14 [place] all-purpose drain was then advanced over the wire and formed with the locking loop in the fluid collection. A total of 80 mL of thick, foul-smelling purulent material was aspirated. A sample was sent for culture. The tube was secured to the skin with 0 Prolene suture and an adhesive fixation device. A sterile bandage was applied.  The patient tolerated the procedure very well ; there was no immediate complication.  ANESTHESIA/SEDATION: Moderate (conscious) sedation was used. 1.5 mg Versed, 50 mcg Fentanyl were administered intravenously. The patient's vital signs were monitored continuously by radiology nursing throughout the procedure.  Sedation Time: 20 minutes  CONTRAST:  None  FLUOROSCOPY TIME:  None  PROCEDURE: 1. CT-guided drain placement Interventional Radiologist:  Sterling Big, MD  IMPRESSION: Successful placement of a 89 French percutaneous drain into the putative diverticular abscess via a right trans gluteal approach.  80 mL  of thick, foul smelling purulent fluid was aspirated. A sample was sent for culture.  Recommend maintaining tube to JP bulb suction for at least 24-48 hr. Convert to gravity bag drainage prior to discharge to minimize risk of fistula formation. Follow up CT scan of the pelvis and tube injection under fluoroscopy prior 2 drain removal.  Signed,  Sterling Big, MD   [facility]  [facility]   Electronically Signed   By: Malachy Moan M.D.   On: 10/02/2013 12:12     Medications: Scheduled: . busPIRone  10 mg Oral BID  . [START ON 10/03/2013] influenza vac split quadrivalent PF  0.5 mL Intramuscular Tomorrow-1000  . levothyroxine  75 mcg Oral QAC breakfast  . lidocaine (PF)  10 mL Other Once  . losartan  50 mg Oral Daily  . piperacillin-tazobactam (ZOSYN)  IV  3.375 g Intravenous Q8H  . [START ON 10/03/2013] pneumococcal 23 valent vaccine  0.5 mL Intramuscular Tomorrow-1000   Continuous: . sodium chloride 100 mL/hr at 10/02/13 1229    Assessment/Plan: Pelvic abscesses-presumably secondary to diverticulitis, status post successful percutaneous drainage earlier today, 80 cc a. purulent discharge sent off for culture, continues on Zosyn, patient afebrile, white blood cell count normal, question etiology, it is of note that she has never had colonoscopy  Gall bladder abnormality based on CT scan-concerning, appreciate general surgery evaluation and management to include ordering MRI, MRCP, CEA and CA 19-9. To be also discussed with surgical oncologist. We'll recheck liver function test, continues on clear liquid diet  Hypokalemia-will replete Pain management started allotted, no adverse side effects with first dose Hypothyroidism-on replacement Hypertension well controlled on ARB therapy   LOS: 1 day   Kiaraliz Rafuse R 10/02/2013, 6:32 PM

## 2013-10-02 NOTE — Procedures (Signed)
Interventional Radiology Procedure Note  Procedure: Placement of a 6F drain into the pelvic cul-de-sac fluid and gas collection via a right transgluteal approach.  80 mL thick, foul-smelling purulent fluid aspirated.  Cavity lavaged with NS and tube connected to JP bulb suction.  Complications: None Recommendations: - Maintain to bulb suction for at least 24 hours - Recommend change to gravity bag drainage prior to D/C - Flush tube BID - Recommend repeat CT scan and tube injection under fluoro prior to tube removal to exclude residual un drained abscess and fistula.   Signed,  Sterling Big, MD [facility]

## 2013-10-02 NOTE — Progress Notes (Signed)
Subjective: Underwent percutaneous drainage of pelvic abscess this morning. This was successful in removing 80 cc of purulent fluid. Suction drain was placed. Await cultures.  She is stable and alert. She does look chronically deconditioned. Apparently reports a 10 month history of malaise, anorexia and weight loss. 3 week history of abdominal pain and alternating diarrhea and constipation.  CT scan reviewed. Large pelvic abscess. The Reddick leak, diverticulitis is the most common cause for this. In the upper abdomen her gallbladder is enlarged, irregular, with solid and cystic components, and the adjacent liver looks abnormal. It is not clear whether or not the duodenal C-loop is invaded by this process.  Objective: Vital signs in last 24 hours: Temp:  [97.9 F (36.6 C)-98.5 F (36.9 C)] 98.1 F (36.7 C) (10/18 1150) Pulse Rate:  [70-88] 70 (10/18 1150) Resp:  [9-18] 16 (10/18 1150) BP: (94-120)/(45-65) 98/48 mmHg (10/18 1150) SpO2:  [97 %-100 %] 97 % (10/18 1150) Weight:  [103 lb (46.72 kg)] 103 lb (46.72 kg) (10/17 1738) Last BM Date: 09/29/13  Intake/Output from previous day:   Intake/Output this shift: Total I/O In: -  Out: 80 [Drains:80]  General appearance: pleasant. Alert. No acute distress. Mental status normal. Husband in room. GI: abdomen soft. A little bit tender in the suprapubic area and a little bit tender in the right subcostal area. I'm not sure that I can feel a mass in the subcostal area or not. The liver is not enlarged.  Lab Results:   Recent Labs  10/01/13 1828 10/02/13 0500  WBC 9.3 8.2  HGB 11.5* 11.4*  HCT 32.6* 32.8*  PLT 328 320   BMET  Recent Labs  10/01/13 2316 10/02/13 0500  NA 135 139  K 3.0* 3.1*  CL 97 102  CO2 21 20  GLUCOSE 70 65*  BUN 8 7  CREATININE 0.50 0.58  CALCIUM 7.7* 7.9*   PT/INR No results found for this basename: LABPROT, INR,  in the last 72 hours ABG No results found for this basename: PHART, PCO2, PO2,  HCO3,  in the last 72 hours  Studies/Results: Ct Abdomen Pelvis W Contrast  10/01/2013   CLINICAL DATA:  Mid abdominal pain and 10 lb weight loss.  BUN and creatinine were obtained on site at [facility] at [address]Results: BUN 8.0 mg/dL, Creatinine 0.6 mg/dL.  EXAM: CT ABDOMEN AND PELVIS WITH CONTRAST  TECHNIQUE: Multidetector CT imaging of the abdomen and pelvis was performed using the standard protocol following bolus administration of intravenous contrast.  CONTRAST:  OMNIPAQUE IOHEXOL 300 MG/ML SOLN, 40mL OMNIPAQUE IOHEXOL 300 MG/ML SOLN  COMPARISON:  03/14/2009.  FINDINGS: The lung bases are clear. No pleural effusion or pulmonary nodules. The heart is normal in size. No pericardial effusion.  The gallbladder is grossly abnormal. There are numerous small gallstones surrounded by a thickened gallbladder wall with cystic change. The gallbladder wall adjacent to the liver is somewhat irregular and on the liver windows there appears to be some infiltrative change in the liver. FINDINGS all are worrisome for gallbladder carcinoma with early liver INDICATION. There are multiple benign appearing hepatic cysts which are stable when compared to a prior CT scan. The common bile duct is normal in caliber. The pancreas is normal. The spleen is normal. There is a small calcified splenic artery aneurysm noted. The adrenal glands are unremarkable. There is a stable left adrenal gland adenoma. The kidneys are normal.  The stomach, duodenum, small bowel and colon are grossly normal. Cannot  definitely exclude involvement of the splenic flexure or 2nd portion of the duodenum on this examination. There are small scattered mesenteric and retroperitoneal lymph nodes but no overt adenopathy.  In the pelvis there are multiple pelvic abscesses. The largest abscesses in the deep right pelvis adjacent to the sigmoid colon and measures a maximum of 7 cm. Smaller abscesses are more anteriorly and just above  the uterus. This is likely from perforated diverticulitis. The uterus and ovaries are grossly normal. The bladder is normal. No inguinal mass or adenopathy.  The bony structures are unremarkable. Moderate degenerative changes. There are smaller abscesses more superiorly. The uterus and ovaries are grossly normal.  IMPRESSION: 1. CT findings worrisome for gallbladder carcinoma likely E invading the adjacent liver. Could not exclude the possibility of involvement of the 2nd portion of the duodenum and the splenic flexure region of the colon MRI abdomen without and with contrast is recommended for further evaluation of this process. No common bile duct dilatation. 2. Multiple benign appearing and stable hepatic cysts. 3. Multiple pelvic abscesses likely due to perforated diverticulitis.  4. Critical Value/emergent results were called by telephone at the time of interpretation on 10/01/2013 at 3:02 PM to Dr.STEPHEN SOUTH .   Electronically Signed   By: Loralie Champagne M.D.   On: 10/01/2013 15:03    Anti-infectives: Anti-infectives   Start     Dose/Rate Route Frequency Ordered Stop   10/01/13 1900  piperacillin-tazobactam (ZOSYN) IVPB 3.375 g     3.375 g 12.5 mL/hr over 240 Minutes Intravenous Every 8 hours 10/01/13 1755        Assessment/Plan:  Pelvic abscesses.   The largest dominant abscess was successfully drained this morning. Managing this acute infection is the first priority. Suspect diverticulitis.  Continue Zosyn. She has never had a colonoscopy  Possible gallbladder cancer. She is aware that this is a possible diagnosis is well. This may be unresectable, it CT findings are predictive We need better definition of her liver, gallbladder, duodenal and CBD anatomy. I have ordered MR I.     and MRCP CEA and CA 19-9 are requested. We will try to begin to address this problem in the hospital, but once her infection is under control to followup as outpatient. I have reviewed her imaging studies  with Dr. Maryruth Eve, our surgical oncologist.   LOS: 1 day    Angelia Mould. Derrell Lolling, M.D., [facility] General and Minimally invasive Surgery Breast and Colorectal Surgery Office:   [phone] Pager:   [phone]  10/02/2013

## 2013-10-02 NOTE — H&P (Signed)
Referring Physician: Dr. Evlyn Kanner  HPI: [name] is an 78 y.o. female who presented to [facility] as a direct admit from her PCP office. The patient had been c/o lower bilateral abdominal pain anterior region with some posterior pain x 3 weeks in which a CT was performed. CT 10/01/13 revealed findings for worrisome GB cancer and follow-up MRI was recommended and surgery is on the case. CT also revealed multiple pelvic abscesses. She also c/o weight loss over the past year. She admits to nausea associated with abdominal pain, she denies any vomiting, fever or chills. She denies any bleeding, blood in her stool or urine. She does admit to changes in her BM between constipation and diarrhea lately. Today upon interview she denies any significant abdominal pain, fever or chills.  Past Medical History:HTN, HLP, Hypothyroid, anxiety.   Past Surgical History: History reviewed. No pertinent past surgical history. She denies any recent surgery. She denies ever having a colonoscopy.  Family History: No family history on file. She denies any history of colon cancer. She denies any history of ovarian cancer.  Social History:  has no tobacco, alcohol, and drug history on file.  Allergies:  Allergies  Allergen Reactions  . Cymbalta [Duloxetine Hcl] Other (See Comments)    "kinda went nuts"  . Halcion [Triazolam] Other (See Comments)    "kinda went nuts"     Medication List    ASK your doctor about these medications       busPIRone 10 MG tablet  Commonly known as:  BUSPAR  Take 10 mg by mouth 2 (two) times daily.     ezetimibe-simvastatin 10-80 MG per tablet  Commonly known as:  VYTORIN  Take 1 tablet by mouth at bedtime.     folic acid 400 MCG tablet  Commonly known as:  FOLVITE  Take 400 mcg by mouth daily.     levothyroxine 75 MCG tablet  Commonly known as:  SYNTHROID, LEVOTHROID  Take 75 mcg by mouth daily before breakfast.     loratadine 10 MG tablet  Commonly known as:  CLARITIN  Take  10 mg by mouth daily.     losartan 50 MG tablet  Commonly known as:  COZAAR  Take 50 mg by mouth every morning.     potassium chloride 20 MEQ packet  Commonly known as:  KLOR-CON  Take 20 mEq by mouth daily.     zaleplon 10 MG capsule  Commonly known as:  SONATA  Take 10 mg by mouth at bedtime.       Please HPI for pertinent positives, otherwise complete 10 system ROS negative.  Physical Exam: BP 100/45  Pulse 80  Temp(Src) 98.5 F (36.9 C) (Oral)  Resp 18  Ht 5' 2.5" (1.588 m)  Wt 103 lb (46.72 kg)  BMI 18.53 kg/m2  SpO2 97% Body mass index is 18.53 kg/(m^2).   General Appearance:  Alert, cooperative, no distress, sitting in chair  Head:  Normocephalic, without obvious abnormality, atraumatic  ENT: Unremarkable  Neck: Supple, symmetrical, trachea midline  Lungs:   Clear to auscultation bilaterally, no w/r/r, respirations unlabored without use of accessory muscles.  Chest Wall:  No tenderness or deformity  Heart:  Regular rate and rhythm, S1, S2 normal, no murmur, rub or gallop.  Abdomen:   Soft, minimal tenderness to deep palpation in pelvic region bilaterally, slightly pelvic region distended.  Extremities: Extremities normal, atraumatic, no cyanosis or edema  Pulses: 2+ and symmetric  Neurologic: Normal affect, no gross deficits.  Results for orders placed during the hospital encounter of 10/01/13 (from the past 48 hour(s))  CBC     Status: Abnormal   Collection Time    10/01/13  6:28 PM      Result Value Range   WBC 9.3  4.0 - 10.5 K/uL   RBC 3.38 (*) 3.87 - 5.11 MIL/uL   Hemoglobin 11.5 (*) 12.0 - 15.0 g/dL   HCT 45.4 (*) 09.8 - 11.9 %   MCV 96.4  78.0 - 100.0 fL   MCH 34.0  26.0 - 34.0 pg   MCHC 35.3  30.0 - 36.0 g/dL   RDW 14.7  82.9 - 56.2 %   Platelets 328  150 - 400 K/uL  COMPREHENSIVE METABOLIC PANEL     Status: Abnormal   Collection Time    10/01/13  6:28 PM      Result Value Range   Sodium 131 (*) 135 - 145 mEq/L   Potassium 2.6 (*) 3.5 - 5.1  mEq/L   Comment: CRITICAL RESULT CALLED TO, READ BACK BY AND VERIFIED WITH:     N CAGUIOA,RN 1920 10/01/13 D BRADLEY   Chloride 91 (*) 96 - 112 mEq/L   CO2 24  19 - 32 mEq/L   Glucose, Bld 78  70 - 99 mg/dL   BUN 10  6 - 23 mg/dL   Creatinine, Ser 1.30  0.50 - 1.10 mg/dL   Calcium 8.0 (*) 8.4 - 10.5 mg/dL   Total Protein 5.6 (*) 6.0 - 8.3 g/dL   Albumin 2.1 (*) 3.5 - 5.2 g/dL   AST 32  0 - 37 U/L   ALT 23  0 - 35 U/L   Alkaline Phosphatase 72  39 - 117 U/L   Total Bilirubin 0.6  0.3 - 1.2 mg/dL   GFR calc non Af Amer 90 (*) >90 mL/min   GFR calc Af Amer >90  >90 mL/min   Comment: (NOTE)     The eGFR has been calculated using the CKD EPI equation.     This calculation has not been validated in all clinical situations.     eGFR's persistently <90 mL/min signify possible Chronic Kidney     Disease.  BASIC METABOLIC PANEL     Status: Abnormal   Collection Time    10/01/13 11:16 PM      Result Value Range   Sodium 135  135 - 145 mEq/L   Potassium 3.0 (*) 3.5 - 5.1 mEq/L   Chloride 97  96 - 112 mEq/L   CO2 21  19 - 32 mEq/L   Glucose, Bld 70  70 - 99 mg/dL   BUN 8  6 - 23 mg/dL   Creatinine, Ser 8.65  0.50 - 1.10 mg/dL   Calcium 7.7 (*) 8.4 - 10.5 mg/dL   GFR calc non Af Amer >90  >90 mL/min   GFR calc Af Amer >90  >90 mL/min   Comment: (NOTE)     The eGFR has been calculated using the CKD EPI equation.     This calculation has not been validated in all clinical situations.     eGFR's persistently <90 mL/min signify possible Chronic Kidney     Disease.  BASIC METABOLIC PANEL     Status: Abnormal   Collection Time    10/02/13  5:00 AM      Result Value Range   Sodium 139  135 - 145 mEq/L   Potassium 3.1 (*) 3.5 - 5.1 mEq/L   Chloride 102  96 - 112 mEq/L   CO2 20  19 - 32 mEq/L   Glucose, Bld 65 (*) 70 - 99 mg/dL   BUN 7  6 - 23 mg/dL   Creatinine, Ser 1.61  0.50 - 1.10 mg/dL   Calcium 7.9 (*) 8.4 - 10.5 mg/dL   GFR calc non Af Amer 87 (*) >90 mL/min   GFR calc Af Amer  >90  >90 mL/min   Comment: (NOTE)     The eGFR has been calculated using the CKD EPI equation.     This calculation has not been validated in all clinical situations.     eGFR's persistently <90 mL/min signify possible Chronic Kidney     Disease.  CBC     Status: Abnormal   Collection Time    10/02/13  5:00 AM      Result Value Range   WBC 8.2  4.0 - 10.5 K/uL   RBC 3.37 (*) 3.87 - 5.11 MIL/uL   Hemoglobin 11.4 (*) 12.0 - 15.0 g/dL   HCT 09.6 (*) 04.5 - 40.9 %   MCV 97.3  78.0 - 100.0 fL   MCH 33.8  26.0 - 34.0 pg   MCHC 34.8  30.0 - 36.0 g/dL   RDW 81.1  91.4 - 78.2 %   Platelets 320  150 - 400 K/uL   Ct Abdomen Pelvis W Contrast  10/01/2013   CLINICAL DATA:  Mid abdominal pain and 10 lb weight loss.  BUN and creatinine were obtained on site at [facility] at [address]Results: BUN 8.0 mg/dL, Creatinine 0.6 mg/dL.  EXAM: CT ABDOMEN AND PELVIS WITH CONTRAST  TECHNIQUE: Multidetector CT imaging of the abdomen and pelvis was performed using the standard protocol following bolus administration of intravenous contrast.  CONTRAST:  OMNIPAQUE IOHEXOL 300 MG/ML SOLN, 40mL OMNIPAQUE IOHEXOL 300 MG/ML SOLN  COMPARISON:  03/14/2009.  FINDINGS: The lung bases are clear. No pleural effusion or pulmonary nodules. The heart is normal in size. No pericardial effusion.  The gallbladder is grossly abnormal. There are numerous small gallstones surrounded by a thickened gallbladder wall with cystic change. The gallbladder wall adjacent to the liver is somewhat irregular and on the liver windows there appears to be some infiltrative change in the liver. FINDINGS all are worrisome for gallbladder carcinoma with early liver INDICATION. There are multiple benign appearing hepatic cysts which are stable when compared to a prior CT scan. The common bile duct is normal in caliber. The pancreas is normal. The spleen is normal. There is a small calcified splenic artery aneurysm noted. The adrenal  glands are unremarkable. There is a stable left adrenal gland adenoma. The kidneys are normal.  The stomach, duodenum, small bowel and colon are grossly normal. Cannot definitely exclude involvement of the splenic flexure or 2nd portion of the duodenum on this examination. There are small scattered mesenteric and retroperitoneal lymph nodes but no overt adenopathy.  In the pelvis there are multiple pelvic abscesses. The largest abscesses in the deep right pelvis adjacent to the sigmoid colon and measures a maximum of 7 cm. Smaller abscesses are more anteriorly and just above the uterus. This is likely from perforated diverticulitis. The uterus and ovaries are grossly normal. The bladder is normal. No inguinal mass or adenopathy.  The bony structures are unremarkable. Moderate degenerative changes. There are smaller abscesses more superiorly. The uterus and ovaries are grossly normal.  IMPRESSION: 1. CT findings worrisome for gallbladder carcinoma likely E invading the adjacent liver.  Could not exclude the possibility of involvement of the 2nd portion of the duodenum and the splenic flexure region of the colon MRI abdomen without and with contrast is recommended for further evaluation of this process. No common bile duct dilatation. 2. Multiple benign appearing and stable hepatic cysts. 3. Multiple pelvic abscesses likely due to perforated diverticulitis.  4. Critical Value/emergent results were called by telephone at the time of interpretation on 10/01/2013 at 3:02 PM to Dr.STEPHEN SOUTH .   Electronically Signed   By: Loralie Champagne M.D.   On: 10/01/2013 15:03    Assessment/Plan Multiple pelvic abscesses with abdominal pain, on IV antibiotics. Afebrile and WBC wnl. Request for image guided percutaneous abscess aspiration and drain placement. Etiology unknown, would suggest sending for labs (cytology and culture) as well as bilirubin level. Patient has been NPO after midnight and has not received any blood  thinners.  Risks and Benefits discussed with the patient and her family member. All of the patient's questions were answered, patient is agreeable to proceed. Consent signed and in chart.    Pattricia Boss D PA-C 10/02/2013, 8:56 AM

## 2013-10-03 ENCOUNTER — Inpatient Hospital Stay (HOSPITAL_COMMUNITY): Payer: BC Managed Care – PPO

## 2013-10-03 LAB — HEPATIC FUNCTION PANEL
ALT: 15 U/L (ref 0–35)
AST: 15 U/L (ref 0–37)
Albumin: 1.9 g/dL — ABNORMAL LOW (ref 3.5–5.2)
Alkaline Phosphatase: 60 U/L (ref 39–117)
Bilirubin, Direct: 0.1 mg/dL (ref 0.0–0.3)
Indirect Bilirubin: 0.2 mg/dL — ABNORMAL LOW (ref 0.3–0.9)
Total Bilirubin: 0.3 mg/dL (ref 0.3–1.2)
Total Protein: 5 g/dL — ABNORMAL LOW (ref 6.0–8.3)

## 2013-10-03 LAB — CBC
HCT: 31.1 % — ABNORMAL LOW (ref 36.0–46.0)
Hemoglobin: 10.6 g/dL — ABNORMAL LOW (ref 12.0–15.0)
MCH: 33.4 pg (ref 26.0–34.0)
MCHC: 34.1 g/dL (ref 30.0–36.0)
MCV: 98.1 fL (ref 78.0–100.0)
Platelets: 279 10*3/uL (ref 150–400)
RBC: 3.17 MIL/uL — ABNORMAL LOW (ref 3.87–5.11)
RDW: 13 % (ref 11.5–15.5)
WBC: 8 10*3/uL (ref 4.0–10.5)

## 2013-10-03 LAB — BASIC METABOLIC PANEL
BUN: 3 mg/dL — ABNORMAL LOW (ref 6–23)
CO2: 20 mEq/L (ref 19–32)
Calcium: 7.9 mg/dL — ABNORMAL LOW (ref 8.4–10.5)
Chloride: 104 mEq/L (ref 96–112)
Creatinine, Ser: 0.51 mg/dL (ref 0.50–1.10)
GFR calc Af Amer: >90 mL/min (ref >90)
GFR calc non Af Amer: >90 mL/min (ref >90)
Glucose, Bld: 78 mg/dL (ref 70–99)
Potassium: 3.5 mEq/L (ref 3.5–5.1)
Sodium: 138 mEq/L (ref 135–145)

## 2013-10-03 MED ORDER — GADOBENATE DIMEGLUMINE 529 MG/ML IV SOLN
10.0000 mL | Freq: Once | INTRAVENOUS | Status: AC | PRN
  Administered 2013-10-03: 10 mL via INTRAVENOUS

## 2013-10-03 MED ORDER — ADULT MULTIVITAMIN W/MINERALS CH
1.0000 | ORAL_TABLET | Freq: Every day | ORAL | Status: DC
  Administered 2013-10-03 – 2013-10-11 (×8): 1 via ORAL
  Filled 2013-10-03 (×10): qty 1

## 2013-10-03 MED ORDER — TEMAZEPAM 7.5 MG PO CAPS
7.5000 mg | ORAL_CAPSULE | Freq: Every evening | ORAL | Status: DC | PRN
  Administered 2013-10-03 – 2013-10-11 (×9): 7.5 mg via ORAL
  Filled 2013-10-03 (×9): qty 1

## 2013-10-03 NOTE — Evaluation (Signed)
Physical Therapy Evaluation Patient Details Name: [name] MRN: [id number] DOB: Oct 04, 1935 Today's Date: 10/03/2013 Time: 4098-1191 PT Time Calculation (min): 13 min  PT Assessment / Plan / Recommendation History of Present Illness  Patient is a 78 yo female admitted with abdominal pain.  Patient with pelvic abscess and ? gall bladder CA.  Clinical Impression  Patient presents with problems listed below.  Will benefit from acute PT to maximize independence prior to discharge.    PT Assessment  Patient needs continued PT services    Follow Up Recommendations  Home health PT;Supervision/Assistance - 24 hour    Does the patient have the potential to tolerate intense rehabilitation      Barriers to Discharge        Equipment Recommendations  None recommended by PT    Recommendations for Other Services     Frequency Min 3X/week    Precautions / Restrictions Precautions Precautions: Fall Restrictions Weight Bearing Restrictions: No   Pertinent Vitals/Pain       Mobility  Bed Mobility Bed Mobility: Supine to Sit;Sit to Supine Supine to Sit: 4: Min guard;HOB elevated Sit to Supine: 4: Min guard;HOB elevated Details for Bed Mobility Assistance: Assist for safety only. Transfers Transfers: Sit to Stand;Stand to Sit Sit to Stand: 4: Min assist;From bed Stand to Sit: 4: Min guard;To bed Details for Transfer Assistance: Verbal cues to move slowly and stand for several seconds before ambulation.  Assist for balance. Ambulation/Gait Ambulation/Gait Assistance: 4: Min guard Ambulation Distance (Feet): 100 Feet Assistive device: None Ambulation/Gait Assistance Details: Patient with slow, guarded gait.  Balance decreased during gait.  LE's shaky with occasional knee buckling. Gait Pattern: Step-through pattern;Decreased stride length Gait velocity: Slow gait speed    Exercises     PT Diagnosis: Difficulty walking;Abnormality of gait;Generalized weakness  PT Problem  List: Decreased strength;Decreased activity tolerance;Decreased balance;Decreased mobility;Cardiopulmonary status limiting activity;Pain PT Treatment Interventions: Gait training;Stair training;Functional mobility training;Therapeutic exercise;Patient/family education     PT Goals(Current goals can be found in the care plan section) Acute Rehab PT Goals Patient Stated Goal: To get stronger PT Goal Formulation: With patient Time For Goal Achievement: 10/10/13 Potential to Achieve Goals: Good  Visit Information  Last PT Received On: 10/03/13 Assistance Needed: +1 History of Present Illness: Patient is a 78 yo female admitted with abdominal pain.  Patient with pelvic abscess and ? gall bladder CA.       Prior Functioning  Home Living Family/patient expects to be discharged to:: Private residence Living Arrangements: Spouse/significant other Available Help at Discharge: Family;Available 24 hours/day Type of Home: House Home Access: Stairs to enter [employer] of Steps: 6 Entrance Stairs-Rails: Right;Left Home Layout: Two level;Able to live on main level with bedroom/bathroom Home Equipment: None Prior Function Level of Independence: Independent Communication Communication: No difficulties    Cognition  Cognition Arousal/Alertness: Awake/alert Behavior During Therapy: WFL for tasks assessed/performed Overall Cognitive Status: Within Functional Limits for tasks assessed    Extremity/Trunk Assessment Upper Extremity Assessment Upper Extremity Assessment: Generalized weakness Lower Extremity Assessment Lower Extremity Assessment: Generalized weakness   Balance    End of Session PT - End of Session Equipment Utilized During Treatment: Gait belt Activity Tolerance: Patient limited by fatigue Patient left: in bed;with call bell/phone within reach Nurse Communication: Mobility status  GP     Vena Austria 10/03/2013, 5:09 PM Durenda Hurt. Renaldo Fiddler, [facility] Pager [phone]

## 2013-10-03 NOTE — Progress Notes (Addendum)
Subjective: Alert. Spirits a little better. Tolerating clear liquids. No bowel movements just a day. Denies nausea. States pain is somewhat better.  No fever or chills.95 cc out drain in 24 hours.WBC 8000. Hemoglobin 10.6. Albumin 1.9.  CEA and CA 19-9 are normal.  MRI performed this morning reviewed with Dr. Loralie Champagne. His impression is that this is more likely to be severe chronic cholecystitis than gallbladder cancer. There is no liver mass. There is no evidence of invasion of liver or duodenum. CBD is normal. There is no adenopathy. I shared this with the patient and her daughter.  Objective: Vital signs in last 24 hours: Temp:  [97.8 F (36.6 C)-98.4 F (36.9 C)] 97.8 F (36.6 C) (10/19 0520) Pulse Rate:  [67-79] 78 (10/19 0520) Resp:  [9-18] 18 (10/19 0520) BP: (96-122)/(47-59) 114/54 mmHg (10/19 0520) SpO2:  [96 %-100 %] 96 % (10/19 0520) Last BM Date: 10/02/13  Intake/Output from previous day: 10/18 0701 - 10/19 0700 In: 2593.3 [P.O.:360; I.V.:2083.3; IV Piggyback:150] Out: 95 [Drains:95] Intake/Output this shift:    General appearance: alert and cooperative. No distress. Mildly deconditioned. GI: abdomen soft. Minimal tenderness suprapubic area without guarding. No mass.minimal, subjective tenderness right upper quadrant.  Lab Results:   Recent Labs  10/02/13 0500 10/03/13 0612  WBC 8.2 8.0  HGB 11.4* 10.6*  HCT 32.8* 31.1*  PLT 320 279   BMET  Recent Labs  10/02/13 0500 10/03/13 0612  NA 139 138  K 3.1* 3.5  CL 102 104  CO2 20 20  GLUCOSE 65* 78  BUN 7 3*  CREATININE 0.58 0.51  CALCIUM 7.9* 7.9*   PT/INR No results found for this basename: LABPROT, INR,  in the last 72 hours ABG No results found for this basename: PHART, PCO2, PO2, HCO3,  in the last 72 hours  Studies/Results: Ct Abdomen Pelvis W Contrast  10/01/2013   CLINICAL DATA:  Mid abdominal pain and 10 lb weight loss.  BUN and creatinine were obtained on site at [facility] at [address]Results: BUN 8.0 mg/dL, Creatinine 0.6 mg/dL.  EXAM: CT ABDOMEN AND PELVIS WITH CONTRAST  TECHNIQUE: Multidetector CT imaging of the abdomen and pelvis was performed using the standard protocol following bolus administration of intravenous contrast.  CONTRAST:  OMNIPAQUE IOHEXOL 300 MG/ML SOLN, 40mL OMNIPAQUE IOHEXOL 300 MG/ML SOLN  COMPARISON:  03/14/2009.  FINDINGS: The lung bases are clear. No pleural effusion or pulmonary nodules. The heart is normal in size. No pericardial effusion.  The gallbladder is grossly abnormal. There are numerous small gallstones surrounded by a thickened gallbladder wall with cystic change. The gallbladder wall adjacent to the liver is somewhat irregular and on the liver windows there appears to be some infiltrative change in the liver. FINDINGS all are worrisome for gallbladder carcinoma with early liver INDICATION. There are multiple benign appearing hepatic cysts which are stable when compared to a prior CT scan. The common bile duct is normal in caliber. The pancreas is normal. The spleen is normal. There is a small calcified splenic artery aneurysm noted. The adrenal glands are unremarkable. There is a stable left adrenal gland adenoma. The kidneys are normal.  The stomach, duodenum, small bowel and colon are grossly normal. Cannot definitely exclude involvement of the splenic flexure or 2nd portion of the duodenum on this examination. There are small scattered mesenteric and retroperitoneal lymph nodes but no overt adenopathy.  In the pelvis there are multiple pelvic abscesses. The largest abscesses in the deep right  pelvis adjacent to the sigmoid colon and measures a maximum of 7 cm. Smaller abscesses are more anteriorly and just above the uterus. This is likely from perforated diverticulitis. The uterus and ovaries are grossly normal. The bladder is normal. No inguinal mass or adenopathy.  The bony structures are unremarkable. Moderate  degenerative changes. There are smaller abscesses more superiorly. The uterus and ovaries are grossly normal.  IMPRESSION: 1. CT findings worrisome for gallbladder carcinoma likely E invading the adjacent liver. Could not exclude the possibility of involvement of the 2nd portion of the duodenum and the splenic flexure region of the colon MRI abdomen without and with contrast is recommended for further evaluation of this process. No common bile duct dilatation. 2. Multiple benign appearing and stable hepatic cysts. 3. Multiple pelvic abscesses likely due to perforated diverticulitis.  4. Critical Value/emergent results were called by telephone at the time of interpretation on 10/01/2013 at 3:02 PM to Dr.STEPHEN SOUTH .   Electronically Signed   By: Loralie Champagne M.D.   On: 10/01/2013 15:03   Ct Image Guided Fluid Drain By Catheter  10/02/2013   CLINICAL DATA:  78 year old female with abdominal pain and the pelvic abscess on recent CT scan. She presents for percutaneous drainage.  EXAM: RADIOLOGY EXAMINATION  Date: 10/02/2013  TECHNIQUE: Informed consent was obtained from the patient following explanation of the procedure, risks, benefits and alternatives. The patient understands, agrees and consents for the procedure. All questions were addressed. A time out was performed.  Maximal barrier sterile technique utilized including caps, mask, sterile gowns, sterile gloves, large sterile drape, hand hygiene, and the Betadine skin prep.  The patient was placed in the prone position. A planning axial CT scan was performed. A suitable skin entry site was selected and marked. Local anesthesia was attained by infiltration with 1% lidocaine. Under CT fluoroscopic guidance, an 18 gauge trocar needle was carefully advanced through the sacral spinous ligament and into the deep pelvic fluid collection. Aspiration through the needle return thick, foul-smelling purulent fluid. Wire was then coiled within the fluid  collection and the tract serially dilated to 14 [place]. A Cook 14 [place] all-purpose drain was then advanced over the wire and formed with the locking loop in the fluid collection. A total of 80 mL of thick, foul-smelling purulent material was aspirated. A sample was sent for culture. The tube was secured to the skin with 0 Prolene suture and an adhesive fixation device. A sterile bandage was applied.  The patient tolerated the procedure very well ; there was no immediate complication.  ANESTHESIA/SEDATION: Moderate (conscious) sedation was used. 1.5 mg Versed, 50 mcg Fentanyl were administered intravenously. The patient's vital signs were monitored continuously by radiology nursing throughout the procedure.  Sedation Time: 20 minutes  CONTRAST:  None  FLUOROSCOPY TIME:  None  PROCEDURE: 1. CT-guided drain placement Interventional Radiologist:  Sterling Big, MD  IMPRESSION: Successful placement of a 72 French percutaneous drain into the putative diverticular abscess via a right trans gluteal approach.  80 mL of thick, foul smelling purulent fluid was aspirated. A sample was sent for culture.  Recommend maintaining tube to JP bulb suction for at least 24-48 hr. Convert to gravity bag drainage prior to discharge to minimize risk of fistula formation. Follow up CT scan of the pelvis and tube injection under fluoroscopy prior 2 drain removal.  Signed,  Sterling Big, MD  [facility]  [facility]   Electronically Signed  By: Malachy Moan M.D.   On: 10/02/2013 12:12    Anti-infectives: Anti-infectives   Start     Dose/Rate Route Frequency Ordered Stop   10/01/13 1900  piperacillin-tazobactam (ZOSYN) IVPB 3.375 g     3.375 g 12.5 mL/hr over 240 Minutes Intravenous Every 8 hours 10/01/13 1755        Assessment/Plan:  Pelvic abscesses. The largest dominant abscess was successfully drained yesterday.   Managing this acute infection is the first  priority.  Culture shows gram-negative rods. Suspect diverticulitis.  Continue Zosyn.  Follow up CT this week She has never had a colonoscopy   Severe chronic cholecystitis with cholelithiasis versus gallbladder cancer. CEA and CA 19-9 are normal, but does not exclude  Long discussion with the patient and her daughter tonight. Told them that the MRI was reassuring, but could not exclude gallbladder cancer until she has an operation. There does not appear to be any liver mass or adenopathy that can be biopsied by image guided biopsy  I told them that the priority was to treat pelvic infection and addressing her nutrition and stopping smoking. Advance diet to full liquids Add multivitamins Her nutrition would need to be much better before she could tolerate an operation with reasonable complication rates. Consider outpatient colonoscopy to exclude colorectal neoplasia Consider elective cholecystectomy in a few months which are nutrition improved. Sooner if symptoms intervene.   PT/OT     LOS: 2 days    Ophie Burrowes M. Derrell Lolling, M.D., [facility] General and Minimally invasive Surgery Breast and Colorectal Surgery Office:   [phone] Pager:   [phone]  10/03/2013

## 2013-10-03 NOTE — Progress Notes (Signed)
Subjective: Patient present with daughter, as well as Dr. Derrell Lolling, who in an extensive discussion regarding current findings of culture results from percutaneous during with gram-negative rods, current MRI results consistent with chronic cholecystitis, patient's malnourished state, and the need for discontinuation of alcohol and tobacco products. Currently patient resting comfortably, still with some discomfort from the percutaneous drain, denies fevers, chills, tolerating clear liquids, denies nausea, vomiting, shortness of breath, chest pain. Multiple questions answered from the daughter regarding current findings, and need for optimizing current health status and plans to include possible open cholecystectomy with resection of significant amounts of inflammation at a later time after resolution of pelvic abscesses.  Objective: Vital signs in last 24 hours: Temp:  [97.8 F (36.6 C)-98.4 F (36.9 C)] 97.8 F (36.6 C) (10/19 0520) Pulse Rate:  [67-79] 78 (10/19 0520) Resp:  [16-18] 18 (10/19 0520) BP: (100-122)/(48-59) 114/54 mmHg (10/19 0520) SpO2:  [96 %-100 %] 96 % (10/19 0520) Weight change:  Last BM Date: 10/02/13  CBG (last 3)  No results found for this basename: GLUCAP,  in the last 72 hours  Intake/Output from previous day: 10/18 0701 - 10/19 0700 In: 2593.3 [P.O.:360; I.V.:2083.3; IV Piggyback:150] Out: 95 [Drains:95] Intake/Output this shift:    Frail, elderly female, no apparent distress, answering all questions appropriately,  Sclera anicteric, tongue midline, no oropharyngeal lesions, neck supple Lungs clear to auscultation without any evidence of respiratory distress, accessory muscle usage, rhonchi or wheezing Regular rate and rhythm, with mild murmur Abdomen, soft, nontender, nondistended, no guarding present Extremity exam reveals no edema, clubbing, cyanosis, pedal pulses intact but somewhat diminished  Lab Results:  Recent Labs  10/02/13 0500 10/03/13 0612  NA  139 138  K 3.1* 3.5  CL 102 104  CO2 20 20  GLUCOSE 65* 78  BUN 7 3*  CREATININE 0.58 0.51  CALCIUM 7.9* 7.9*    Recent Labs  10/01/13 1828 10/03/13 0612  AST 32 15  ALT 23 15  ALKPHOS 72 60  BILITOT 0.6 0.3  PROT 5.6* 5.0*  ALBUMIN 2.1* 1.9*    Recent Labs  10/02/13 0500 10/03/13 0612  WBC 8.2 8.0  HGB 11.4* 10.6*  HCT 32.8* 31.1*  MCV 97.3 98.1  PLT 320 279   No results found for this basename: CKTOTAL, CKMB, CKMBINDEX, TROPONINI,  in the last 72 hours No results found for this basename: TSH, T4TOTAL, FREET3, T3FREE, THYROIDAB,  in the last 72 hours No results found for this basename: VITAMINB12, FOLATE, FERRITIN, TIBC, IRON, RETICCTPCT,  in the last 72 hours  Studies/Results: Ct Abdomen Pelvis W Contrast  10/01/2013   CLINICAL DATA:  Mid abdominal pain and 10 lb weight loss.  BUN and creatinine were obtained on site at [facility] at [address]Results: BUN 8.0 mg/dL, Creatinine 0.6 mg/dL.  EXAM: CT ABDOMEN AND PELVIS WITH CONTRAST  TECHNIQUE: Multidetector CT imaging of the abdomen and pelvis was performed using the standard protocol following bolus administration of intravenous contrast.  CONTRAST:  OMNIPAQUE IOHEXOL 300 MG/ML SOLN, 40mL OMNIPAQUE IOHEXOL 300 MG/ML SOLN  COMPARISON:  03/14/2009.  FINDINGS: The lung bases are clear. No pleural effusion or pulmonary nodules. The heart is normal in size. No pericardial effusion.  The gallbladder is grossly abnormal. There are numerous small gallstones surrounded by a thickened gallbladder wall with cystic change. The gallbladder wall adjacent to the liver is somewhat irregular and on the liver windows there appears to be some infiltrative change in the liver. FINDINGS all are  worrisome for gallbladder carcinoma with early liver INDICATION. There are multiple benign appearing hepatic cysts which are stable when compared to a prior CT scan. The common bile duct is normal in caliber. The pancreas is normal.  The spleen is normal. There is a small calcified splenic artery aneurysm noted. The adrenal glands are unremarkable. There is a stable left adrenal gland adenoma. The kidneys are normal.  The stomach, duodenum, small bowel and colon are grossly normal. Cannot definitely exclude involvement of the splenic flexure or 2nd portion of the duodenum on this examination. There are small scattered mesenteric and retroperitoneal lymph nodes but no overt adenopathy.  In the pelvis there are multiple pelvic abscesses. The largest abscesses in the deep right pelvis adjacent to the sigmoid colon and measures a maximum of 7 cm. Smaller abscesses are more anteriorly and just above the uterus. This is likely from perforated diverticulitis. The uterus and ovaries are grossly normal. The bladder is normal. No inguinal mass or adenopathy.  The bony structures are unremarkable. Moderate degenerative changes. There are smaller abscesses more superiorly. The uterus and ovaries are grossly normal.  IMPRESSION: 1. CT findings worrisome for gallbladder carcinoma likely E invading the adjacent liver. Could not exclude the possibility of involvement of the 2nd portion of the duodenum and the splenic flexure region of the colon MRI abdomen without and with contrast is recommended for further evaluation of this process. No common bile duct dilatation. 2. Multiple benign appearing and stable hepatic cysts. 3. Multiple pelvic abscesses likely due to perforated diverticulitis.  4. Critical Value/emergent results were called by telephone at the time of interpretation on 10/01/2013 at 3:02 PM to Dr.STEPHEN SOUTH .   Electronically Signed   By: Loralie Champagne M.D.   On: 10/01/2013 15:03   Mr 3d Recon At Scanner  10/03/2013   CLINICAL DATA:  Evaluate abnormal gallbladder demonstrated on recent CT scan.  EXAM: MRI ABDOMEN WITHOUT AND WITH CONTRAST (INCLUDING MRCP)  TECHNIQUE: Multiplanar multisequence MR imaging of the abdomen was performed both  before and after the administration of intravenous contrast. Heavily T2-weighted images of the biliary and pancreatic ducts were obtained, and three-dimensional MRCP images were rendered by post processing.  CONTRAST:  10mL MULTIHANCE GADOBENATE DIMEGLUMINE 529 MG/ML IV SOLN  COMPARISON:  CT scan 03/14/2009 and 10/01/2013  FINDINGS: The gallbladder demonstrates marked wall thickening and enhancement postcontrast. There are multiple areas of cystic change in the gallbladder wall and numerous gallstones. I do not see a discrete enhancing mass to suggest gallbladder carcinoma. I think the findings are much more likely due to chronic cholecystitis. No findings for involvement/ invasion of the liver or duodenum. There are multiple benign appearing stable hepatic cysts. The common bile duct is normal in caliber and has a normal course. The pancreatic duct is normal. The pancreas is unremarkable. The spleen is normal in size. No focal lesions. The adrenal glands are slightly enlarged and nodular suggesting nodular hyperplasia. The kidneys are unremarkable.  Mildly dilated small bowel loops and colon may be to a diffuse ileus.  No significant bony findings.  IMPRESSION: MR findings most consistent with chronic calculus cholecystitis. No definite gallbladder mass or involvement of the liver or duodenum.  Normal pancreaticobiliary tree.   Electronically Signed   By: Loralie Champagne M.D.   On: 10/03/2013 11:12   Mr Abd W/wo Cm/mrcp  10/03/2013   CLINICAL DATA:  Evaluate abnormal gallbladder demonstrated on recent CT scan.  EXAM: MRI ABDOMEN WITHOUT AND WITH CONTRAST (INCLUDING MRCP)  TECHNIQUE: Multiplanar multisequence MR imaging of the abdomen was performed both before and after the administration of intravenous contrast. Heavily T2-weighted images of the biliary and pancreatic ducts were obtained, and three-dimensional MRCP images were rendered by post processing.  CONTRAST:  10mL MULTIHANCE GADOBENATE DIMEGLUMINE 529  MG/ML IV SOLN  COMPARISON:  CT scan 03/14/2009 and 10/01/2013  FINDINGS: The gallbladder demonstrates marked wall thickening and enhancement postcontrast. There are multiple areas of cystic change in the gallbladder wall and numerous gallstones. I do not see a discrete enhancing mass to suggest gallbladder carcinoma. I think the findings are much more likely due to chronic cholecystitis. No findings for involvement/ invasion of the liver or duodenum. There are multiple benign appearing stable hepatic cysts. The common bile duct is normal in caliber and has a normal course. The pancreatic duct is normal. The pancreas is unremarkable. The spleen is normal in size. No focal lesions. The adrenal glands are slightly enlarged and nodular suggesting nodular hyperplasia. The kidneys are unremarkable.  Mildly dilated small bowel loops and colon may be to a diffuse ileus.  No significant bony findings.  IMPRESSION: MR findings most consistent with chronic calculus cholecystitis. No definite gallbladder mass or involvement of the liver or duodenum.  Normal pancreaticobiliary tree.   Electronically Signed   By: Loralie Champagne M.D.   On: 10/03/2013 11:12   Ct Image Guided Fluid Drain By Catheter  10/02/2013   CLINICAL DATA:  78 year old female with abdominal pain and the pelvic abscess on recent CT scan. She presents for percutaneous drainage.  EXAM: RADIOLOGY EXAMINATION  Date: 10/02/2013  TECHNIQUE: Informed consent was obtained from the patient following explanation of the procedure, risks, benefits and alternatives. The patient understands, agrees and consents for the procedure. All questions were addressed. A time out was performed.  Maximal barrier sterile technique utilized including caps, mask, sterile gowns, sterile gloves, large sterile drape, hand hygiene, and the Betadine skin prep.  The patient was placed in the prone position. A planning axial CT scan was performed. A suitable skin entry site was  selected and marked. Local anesthesia was attained by infiltration with 1% lidocaine. Under CT fluoroscopic guidance, an 18 gauge trocar needle was carefully advanced through the sacral spinous ligament and into the deep pelvic fluid collection. Aspiration through the needle return thick, foul-smelling purulent fluid. Wire was then coiled within the fluid collection and the tract serially dilated to 14 [place]. A Cook 14 [place] all-purpose drain was then advanced over the wire and formed with the locking loop in the fluid collection. A total of 80 mL of thick, foul-smelling purulent material was aspirated. A sample was sent for culture. The tube was secured to the skin with 0 Prolene suture and an adhesive fixation device. A sterile bandage was applied.  The patient tolerated the procedure very well ; there was no immediate complication.  ANESTHESIA/SEDATION: Moderate (conscious) sedation was used. 1.5 mg Versed, 50 mcg Fentanyl were administered intravenously. The patient's vital signs were monitored continuously by radiology nursing throughout the procedure.  Sedation Time: 20 minutes  CONTRAST:  None  FLUOROSCOPY TIME:  None  PROCEDURE: 1. CT-guided drain placement Interventional Radiologist:  Sterling Big, MD  IMPRESSION: Successful placement of a 73 French percutaneous drain into the putative diverticular abscess via a right trans gluteal approach.  80 mL of thick, foul smelling purulent fluid was aspirated. A sample was sent for culture.  Recommend maintaining tube to JP bulb suction for at least 24-48 hr.  Convert to gravity bag drainage prior to discharge to minimize risk of fistula formation. Follow up CT scan of the pelvis and tube injection under fluoroscopy prior 2 drain removal.  Signed,  Sterling Big, MD  [facility]  [facility]   Electronically Signed   By: Malachy Moan M.D.   On: 10/02/2013 12:12     Medications: Scheduled: .  busPIRone  10 mg Oral BID  . influenza vac split quadrivalent PF  0.5 mL Intramuscular Tomorrow-1000  . levothyroxine  75 mcg Oral QAC breakfast  . lidocaine (PF)  10 mL Other Once  . losartan  50 mg Oral Daily  . multivitamin with minerals  1 tablet Oral Daily  . piperacillin-tazobactam (ZOSYN)  IV  3.375 g Intravenous Q8H  . pneumococcal 23 valent vaccine  0.5 mL Intramuscular Tomorrow-1000   Continuous: . sodium chloride 100 mL/hr at 10/03/13 4098    Assessment/Plan: Pelvic abscesses-presumably secondary to diverticulitis, status post successful percutaneous drainage yesterday with  purulent discharge sent off for culture, initial results revealed gram-negative rods, consistent with diverticulitis, continues on Zosyn, patient afebrile, white blood cell count normal, need for future colonoscopy discussed. Antibiotics to be modified based on identification and sensitivity, as well as a ration of treatment based on future scanning Gall bladder abnormality based on CT scan with MRI consistent with extensive chronic cholecystitis, CEA and CA 19-9 are normal. Assessment plan from Dr. Derrell Lolling noted and appreciated and certainly appreciate his management. I did discuss and reiterate to the patient and daughter that smoking cessation, cessation of all alcohol, and optimization of nutritional status will be a prime concern and plan for future open cholecystectomy in order to minimize potential complications. PT and OT to be ordered by Dr. Derrell Lolling Tobacco abuse-discussed at length with patient and daughter including the need for complete cessation, and now given that patient will be off of smoking products for several days, now would be the best time, discussed that we can certainly try to facilitate this on an outpatient basis using other products such as nicotine replacement and/or Chantix if needed Alcohol use-considered to be minimal but discussed the need for disposing of any empty calories and  optimizing intake of nutritional food. Hypokalemia-will replete-stable we'll recheck Pain management started allotted, no adverse side effects with first dose Hypothyroidism-on replacement Hypertension well controlled on ARB therapy   LOS: 2 days   Catalia Massett R 10/03/2013, 11:57 AM

## 2013-10-04 LAB — HEPATIC FUNCTION PANEL
ALT: 106 U/L — ABNORMAL HIGH (ref 0–35)
AST: 213 U/L — ABNORMAL HIGH (ref 0–37)
Albumin: 1.9 g/dL — ABNORMAL LOW (ref 3.5–5.2)
Alkaline Phosphatase: 448 U/L — ABNORMAL HIGH (ref 39–117)
Bilirubin, Direct: 0.3 mg/dL (ref 0.0–0.3)
Indirect Bilirubin: 0.3 mg/dL (ref 0.3–0.9)
Total Bilirubin: 0.6 mg/dL (ref 0.3–1.2)
Total Protein: 5.2 g/dL — ABNORMAL LOW (ref 6.0–8.3)

## 2013-10-04 LAB — CULTURE, ROUTINE-ABSCESS

## 2013-10-04 LAB — CBC
HCT: 33.3 % — ABNORMAL LOW (ref 36.0–46.0)
Hemoglobin: 11.3 g/dL — ABNORMAL LOW (ref 12.0–15.0)
MCH: 33.1 pg (ref 26.0–34.0)
MCHC: 33.9 g/dL (ref 30.0–36.0)
MCV: 97.7 fL (ref 78.0–100.0)
Platelets: 262 10*3/uL (ref 150–400)
RBC: 3.41 MIL/uL — ABNORMAL LOW (ref 3.87–5.11)
RDW: 13 % (ref 11.5–15.5)
WBC: 6.6 10*3/uL (ref 4.0–10.5)

## 2013-10-04 LAB — BASIC METABOLIC PANEL
BUN: <3 mg/dL — ABNORMAL LOW (ref 6–23)
CO2: 23 mEq/L (ref 19–32)
Calcium: 8 mg/dL — ABNORMAL LOW (ref 8.4–10.5)
Chloride: 102 mEq/L (ref 96–112)
Creatinine, Ser: 0.58 mg/dL (ref 0.50–1.10)
GFR calc Af Amer: >90 mL/min (ref >90)
GFR calc non Af Amer: 87 mL/min — ABNORMAL LOW (ref >90)
Glucose, Bld: 91 mg/dL (ref 70–99)
Potassium: 3.4 mEq/L — ABNORMAL LOW (ref 3.5–5.1)
Sodium: 137 mEq/L (ref 135–145)

## 2013-10-04 MED ORDER — TRAMADOL HCL 50 MG PO TABS
50.0000 mg | ORAL_TABLET | Freq: Four times a day (QID) | ORAL | Status: DC | PRN
  Administered 2013-10-04 – 2013-10-06 (×5): 50 mg via ORAL
  Filled 2013-10-04 (×7): qty 1

## 2013-10-04 MED ORDER — ENSURE COMPLETE PO LIQD
237.0000 mL | Freq: Two times a day (BID) | ORAL | Status: DC
  Administered 2013-10-04 – 2013-10-11 (×12): 237 mL via ORAL

## 2013-10-04 MED ORDER — POTASSIUM CHLORIDE CRYS ER 20 MEQ PO TBCR
20.0000 meq | EXTENDED_RELEASE_TABLET | Freq: Two times a day (BID) | ORAL | Status: DC
  Administered 2013-10-04 – 2013-10-11 (×14): 20 meq via ORAL
  Filled 2013-10-04 (×19): qty 1

## 2013-10-04 NOTE — Progress Notes (Signed)
ANTIBIOTIC CONSULT NOTE - follow up Pharmacy Consult for Zosyn Indication: Pelvic abscess  Allergies  Allergen Reactions  . Cymbalta [Duloxetine Hcl] Other (See Comments)    "kinda went nuts"  . Halcion [Triazolam] Other (See Comments)    "kinda went nuts"    Patient Measurements: Height: 5' 2.5" (158.8 cm) Weight: 103 lb (46.72 kg) IBW/kg (Calculated) : 51.25  Vital Signs: Temp: 99.1 F (37.3 C) (10/20 0551) Temp src: Oral (10/20 0551) BP: 142/71 mmHg (10/20 0551) Pulse Rate: 80 (10/20 0551) Intake/Output from previous day: 10/19 0701 - 10/20 0700 In: 480 [P.O.:480] Out: 10 [Drains:10] Intake/Output from this shift:    Labs:  Recent Labs  10/02/13 0500 10/03/13 0612 10/04/13 0630  WBC 8.2 8.0 6.6  HGB 11.4* 10.6* 11.3*  PLT 320 279 262  CREATININE 0.58 0.51 0.58   Estimated Creatinine Clearance: 44.1 ml/min (by C-G formula based on Cr of 0.58). No results found for this basename: VANCOTROUGH, Leodis Binet, VANCORANDOM, GENTTROUGH, GENTPEAK, GENTRANDOM, TOBRATROUGH, TOBRAPEAK, TOBRARND, AMIKACINPEAK, AMIKACINTROU, AMIKACIN,  in the last 72 hours   Microbiology: Recent Results (from the past 720 hour(s))  CULTURE, ROUTINE-ABSCESS     Status: None   Collection Time    10/02/13 11:43 AM      Result Value Range Status   Specimen Description ABSCESS   Final   Special Requests PELVIC CUL DE SAC   Final   Gram Stain     Final   Value: FEW WBC PRESENT, PREDOMINANTLY PMN     NO SQUAMOUS EPITHELIAL CELLS SEEN     FEW GRAM NEGATIVE RODS     Performed at [facility]   Culture     Final   Value: MODERATE ESCHERICHIA COLI     Performed at [facility]   Report Status 10/04/2013 FINAL   Final   Organism ID, Bacteria ESCHERICHIA COLI   Final  ANAEROBIC CULTURE     Status: None   Collection Time    10/02/13 11:43 AM      Result Value Range Status   Specimen Description ABSCESS   Final   Special Requests PELVIC CUL DE SAC   Final   Gram Stain PENDING    Incomplete   Culture     Final   Value: NO ANAEROBES ISOLATED; CULTURE IN PROGRESS FOR 5 DAYS     Performed at [facility]   Report Status PENDING   Incomplete    Assessment:  78 yo F on zosyn per pharmacy day # 4 for ? diverticulitis with pelvic abscesses, s/p perc drain. She will eventually need a repeat CT scan to determine when drain can be removed  WBC down to 6.6, creat stable at 0.58, Tmax 99.1,   10/18 pelvic cul de sac abscess cx: E coli: R to amp/unasyn, sensitive to all other abx tested -   Goal of Therapy:  abx for divertuclar abscess  Plan:  Continue Zosyn 3.375g IV q8hrs. Herby Abraham, Pharm.D. [phone] 10/04/2013 10:32 AM

## 2013-10-04 NOTE — Progress Notes (Signed)
Patient interviewed and examined, agree with PA note above. Her abdomen is soft and nontender. She is comfortable. All questions answered. Mariella Saa MD, FACS  10/04/2013 9:54 AM

## 2013-10-04 NOTE — Progress Notes (Signed)
Physical Therapy Treatment Patient Details Name: [name] MRN: [id number] DOB: 04/04/1935 Today's Date: 10/04/2013 Time: 7829-5621 PT Time Calculation (min): 24 min  PT Assessment / Plan / Recommendation  History of Present Illness Pt admitted with abdominal pain due to pelvic abscess due to diverticulitis, chronic cholecystitis.   PT Comments   Patient tired but agreeable to walk. Patient requires increased time and limited by fatigue. Continue with current POC and if able will attempt steps next session  Follow Up Recommendations  Home health PT;Supervision/Assistance - 24 hour     Does the patient have the potential to tolerate intense rehabilitation     Barriers to Discharge        Equipment Recommendations  None recommended by PT    Recommendations for Other Services    Frequency Min 3X/week   Progress towards PT Goals Progress towards PT goals: Progressing toward goals  Plan Current plan remains appropriate    Precautions / Restrictions Precautions Precautions: Fall   Pertinent Vitals/Pain Denied pain    Mobility  Bed Mobility Bed Mobility: Supine to Sit;Sit to Supine Supine to Sit: HOB elevated;5: Supervision Sit to Supine: HOB elevated;5: Supervision Details for Bed Mobility Assistance: increased effort Transfers Sit to Stand: 4: Min assist;From bed Stand to Sit: 4: Min guard;To bed Details for Transfer Assistance: A to steady patient Ambulation/Gait Ambulation/Gait Assistance: 4: Min assist Ambulation Distance (Feet): 120 Feet Assistive device: None Ambulation/Gait Assistance Details: Pushing IV pole. Min A for balance. One seated rest break Gait Pattern: Step-through pattern;Decreased stride length Gait velocity: Slow gait speed    Exercises     PT Diagnosis:    PT Problem List:   PT Treatment Interventions:     PT Goals (current goals can now be found in the care plan section) Acute Rehab PT Goals Patient Stated Goal: To get  stronger  Visit Information  Last PT Received On: 10/04/13 Assistance Needed: +1 History of Present Illness: Pt admitted with abdominal pain due to pelvic abscess due to diverticulitis, chronic cholecystitis.    Subjective Data  Patient Stated Goal: To get stronger   Cognition  Cognition Arousal/Alertness: Awake/alert Behavior During Therapy: WFL for tasks assessed/performed Overall Cognitive Status: Within Functional Limits for tasks assessed    Balance  Balance Balance Assessed: Yes Static Sitting Balance Static Sitting - Balance Support: Feet supported Static Sitting - Level of Assistance: 7: Independent Static Standing Balance Static Standing - Balance Support: No upper extremity supported Static Standing - Level of Assistance: 5: Stand by assistance Static Standing - Comment/# of Minutes: 1  End of Session PT - End of Session Equipment Utilized During Treatment: Gait belt Activity Tolerance: Patient limited by fatigue Patient left: in bed;with call bell/phone within reach Nurse Communication: Mobility status   GP     Fredrich Birks 10/04/2013, 2:15 PM  10/04/2013 Fredrich Birks PTA [phone] pager [phone] office

## 2013-10-04 NOTE — Progress Notes (Signed)
Subjective: Patient is doing well today s/p pelvic abscess drain placed 10/18. She states some tenderness at right TG drain site and would like pain medication. She denies any abdominal pain, N/V. She does c/o loose stool.   Objective: Physical Exam: BP 142/71  Pulse 80  Temp(Src) 99.1 F (37.3 C) (Oral)  Resp 18  Ht 5' 2.5" (1.588 m)  Wt 103 lb (46.72 kg)  BMI 18.53 kg/m2  SpO2 96%  General: A&ox3, NAD, lying in bed Abd: Soft, NT, (+) BS Drain dressing C/D/I, Right TG 26F output serosang 10cc in bulb, tenderness at drain site.  Labs: CBC  Recent Labs  10/03/13 0612 10/04/13 0630  WBC 8.0 6.6  HGB 10.6* 11.3*  HCT 31.1* 33.3*  PLT 279 262   BMET  Recent Labs  10/03/13 0612 10/04/13 0630  NA 138 137  K 3.5 3.4*  CL 104 102  CO2 20 23  GLUCOSE 78 91  BUN 3* <3*  CREATININE 0.51 0.58  CALCIUM 7.9* 8.0*   LFT  Recent Labs  10/04/13 0630  PROT 5.2*  ALBUMIN 1.9*  AST 213*  ALT 106*  ALKPHOS 448*  BILITOT 0.6  BILIDIR 0.3  IBILI 0.3   PT/INR No results found for this basename: LABPROT, INR,  in the last 72 hours   Studies/Results: Mr 3d Recon At Scanner  10/03/2013   CLINICAL DATA:  Evaluate abnormal gallbladder demonstrated on recent CT scan.  EXAM: MRI ABDOMEN WITHOUT AND WITH CONTRAST (INCLUDING MRCP)  TECHNIQUE: Multiplanar multisequence MR imaging of the abdomen was performed both before and after the administration of intravenous contrast. Heavily T2-weighted images of the biliary and pancreatic ducts were obtained, and three-dimensional MRCP images were rendered by post processing.  CONTRAST:  10mL MULTIHANCE GADOBENATE DIMEGLUMINE 529 MG/ML IV SOLN  COMPARISON:  CT scan 03/14/2009 and 10/01/2013  FINDINGS: The gallbladder demonstrates marked wall thickening and enhancement postcontrast. There are multiple areas of cystic change in the gallbladder wall and numerous gallstones. I do not see a discrete enhancing mass to suggest gallbladder carcinoma.  I think the findings are much more likely due to chronic cholecystitis. No findings for involvement/ invasion of the liver or duodenum. There are multiple benign appearing stable hepatic cysts. The common bile duct is normal in caliber and has a normal course. The pancreatic duct is normal. The pancreas is unremarkable. The spleen is normal in size. No focal lesions. The adrenal glands are slightly enlarged and nodular suggesting nodular hyperplasia. The kidneys are unremarkable.  Mildly dilated small bowel loops and colon may be to a diffuse ileus.  No significant bony findings.  IMPRESSION: MR findings most consistent with chronic calculus cholecystitis. No definite gallbladder mass or involvement of the liver or duodenum.  Normal pancreaticobiliary tree.   Electronically Signed   By: Loralie Champagne M.D.   On: 10/03/2013 11:12   Mr Abd W/wo Cm/mrcp  10/03/2013   CLINICAL DATA:  Evaluate abnormal gallbladder demonstrated on recent CT scan.  EXAM: MRI ABDOMEN WITHOUT AND WITH CONTRAST (INCLUDING MRCP)  TECHNIQUE: Multiplanar multisequence MR imaging of the abdomen was performed both before and after the administration of intravenous contrast. Heavily T2-weighted images of the biliary and pancreatic ducts were obtained, and three-dimensional MRCP images were rendered by post processing.  CONTRAST:  10mL MULTIHANCE GADOBENATE DIMEGLUMINE 529 MG/ML IV SOLN  COMPARISON:  CT scan 03/14/2009 and 10/01/2013  FINDINGS: The gallbladder demonstrates marked wall thickening and enhancement postcontrast. There are multiple areas of cystic change in the gallbladder  wall and numerous gallstones. I do not see a discrete enhancing mass to suggest gallbladder carcinoma. I think the findings are much more likely due to chronic cholecystitis. No findings for involvement/ invasion of the liver or duodenum. There are multiple benign appearing stable hepatic cysts. The common bile duct is normal in caliber and has a normal course.  The pancreatic duct is normal. The pancreas is unremarkable. The spleen is normal in size. No focal lesions. The adrenal glands are slightly enlarged and nodular suggesting nodular hyperplasia. The kidneys are unremarkable.  Mildly dilated small bowel loops and colon may be to a diffuse ileus.  No significant bony findings.  IMPRESSION: MR findings most consistent with chronic calculus cholecystitis. No definite gallbladder mass or involvement of the liver or duodenum.  Normal pancreaticobiliary tree.   Electronically Signed   By: Loralie Champagne M.D.   On: 10/03/2013 11:12   Assessment/Plan: Multiple pelvic abscesses s/p drain of largest collection with 48F percutaneous drain placed 10/02/13, wbc wnl 6.6, drain output serosang now, Cx E. Coli on antibiotics. Will repeat CT once output minimal, continue flushing and monitoring output. IR will follow. All other plans per [facility].   LOS: 3 days    Berneta Levins PA-C 10/04/2013 1:22 PM

## 2013-10-04 NOTE — Progress Notes (Signed)
Patient ID: [name], female   DOB: [birth year], 78 y.o.   MRN: [id number]    Subjective: Pt feels ok today.  No pain at all.  Minimal appetite.  Objective: Vital signs in last 24 hours: Temp:  [97.7 F (36.5 C)-99.1 F (37.3 C)] 99.1 F (37.3 C) (10/20 0551) Pulse Rate:  [68-88] 80 (10/20 0551) Resp:  [16-18] 18 (10/20 0551) BP: (104-142)/(53-71) 142/71 mmHg (10/20 0551) SpO2:  [95 %-96 %] 96 % (10/20 0551) Last BM Date: 10/02/13  Intake/Output from previous day: 10/19 0701 - 10/20 0700 In: 480 [P.O.:480] Out: 10 [Drains:10] Intake/Output this shift:    PE: Abd: soft, NT, ND, +BS, JP drain in place in right gluteus with cloudy thin serosang output Heart: regular Lungs: CTAB  Lab Results:   Recent Labs  10/03/13 0612 10/04/13 0630  WBC 8.0 6.6  HGB 10.6* 11.3*  HCT 31.1* 33.3*  PLT 279 262   BMET  Recent Labs  10/03/13 0612 10/04/13 0630  NA 138 137  K 3.5 3.4*  CL 104 102  CO2 20 23  GLUCOSE 78 91  BUN 3* <3*  CREATININE 0.51 0.58  CALCIUM 7.9* 8.0*   PT/INR No results found for this basename: LABPROT, INR,  in the last 72 hours CMP     Component Value Date/Time   NA 137 10/04/2013 0630   K 3.4* 10/04/2013 0630   CL 102 10/04/2013 0630   CO2 23 10/04/2013 0630   GLUCOSE 91 10/04/2013 0630   BUN <3* 10/04/2013 0630   CREATININE 0.58 10/04/2013 0630   CALCIUM 8.0* 10/04/2013 0630   PROT 5.2* 10/04/2013 0630   ALBUMIN 1.9* 10/04/2013 0630   AST 213* 10/04/2013 0630   ALT 106* 10/04/2013 0630   ALKPHOS 448* 10/04/2013 0630   BILITOT 0.6 10/04/2013 0630   GFRNONAA 87* 10/04/2013 0630   GFRAA >90 10/04/2013 0630   Lipase  No results found for this basename: lipase       Studies/Results: Mr 3d Recon At Scanner  10/03/2013   CLINICAL DATA:  Evaluate abnormal gallbladder demonstrated on recent CT scan.  EXAM: MRI ABDOMEN WITHOUT AND WITH CONTRAST (INCLUDING MRCP)  TECHNIQUE: Multiplanar multisequence MR imaging of the abdomen was  performed both before and after the administration of intravenous contrast. Heavily T2-weighted images of the biliary and pancreatic ducts were obtained, and three-dimensional MRCP images were rendered by post processing.  CONTRAST:  10mL MULTIHANCE GADOBENATE DIMEGLUMINE 529 MG/ML IV SOLN  COMPARISON:  CT scan 03/14/2009 and 10/01/2013  FINDINGS: The gallbladder demonstrates marked wall thickening and enhancement postcontrast. There are multiple areas of cystic change in the gallbladder wall and numerous gallstones. I do not see a discrete enhancing mass to suggest gallbladder carcinoma. I think the findings are much more likely due to chronic cholecystitis. No findings for involvement/ invasion of the liver or duodenum. There are multiple benign appearing stable hepatic cysts. The common bile duct is normal in caliber and has a normal course. The pancreatic duct is normal. The pancreas is unremarkable. The spleen is normal in size. No focal lesions. The adrenal glands are slightly enlarged and nodular suggesting nodular hyperplasia. The kidneys are unremarkable.  Mildly dilated small bowel loops and colon may be to a diffuse ileus.  No significant bony findings.  IMPRESSION: MR findings most consistent with chronic calculus cholecystitis. No definite gallbladder mass or involvement of the liver or duodenum.  Normal pancreaticobiliary tree.   Electronically Signed   By: Luan Pulling.D.  On: 10/03/2013 11:12   Mr Abd W/wo Cm/mrcp  10/03/2013   CLINICAL DATA:  Evaluate abnormal gallbladder demonstrated on recent CT scan.  EXAM: MRI ABDOMEN WITHOUT AND WITH CONTRAST (INCLUDING MRCP)  TECHNIQUE: Multiplanar multisequence MR imaging of the abdomen was performed both before and after the administration of intravenous contrast. Heavily T2-weighted images of the biliary and pancreatic ducts were obtained, and three-dimensional MRCP images were rendered by post processing.  CONTRAST:  10mL MULTIHANCE GADOBENATE  DIMEGLUMINE 529 MG/ML IV SOLN  COMPARISON:  CT scan 03/14/2009 and 10/01/2013  FINDINGS: The gallbladder demonstrates marked wall thickening and enhancement postcontrast. There are multiple areas of cystic change in the gallbladder wall and numerous gallstones. I do not see a discrete enhancing mass to suggest gallbladder carcinoma. I think the findings are much more likely due to chronic cholecystitis. No findings for involvement/ invasion of the liver or duodenum. There are multiple benign appearing stable hepatic cysts. The common bile duct is normal in caliber and has a normal course. The pancreatic duct is normal. The pancreas is unremarkable. The spleen is normal in size. No focal lesions. The adrenal glands are slightly enlarged and nodular suggesting nodular hyperplasia. The kidneys are unremarkable.  Mildly dilated small bowel loops and colon may be to a diffuse ileus.  No significant bony findings.  IMPRESSION: MR findings most consistent with chronic calculus cholecystitis. No definite gallbladder mass or involvement of the liver or duodenum.  Normal pancreaticobiliary tree.   Electronically Signed   By: Loralie Champagne M.D.   On: 10/03/2013 11:12   Ct Image Guided Fluid Drain By Catheter  10/02/2013   CLINICAL DATA:  78 year old female with abdominal pain and the pelvic abscess on recent CT scan. She presents for percutaneous drainage.  EXAM: RADIOLOGY EXAMINATION  Date: 10/02/2013  TECHNIQUE: Informed consent was obtained from the patient following explanation of the procedure, risks, benefits and alternatives. The patient understands, agrees and consents for the procedure. All questions were addressed. A time out was performed.  Maximal barrier sterile technique utilized including caps, mask, sterile gowns, sterile gloves, large sterile drape, hand hygiene, and the Betadine skin prep.  The patient was placed in the prone position. A planning axial CT scan was performed. A suitable skin  entry site was selected and marked. Local anesthesia was attained by infiltration with 1% lidocaine. Under CT fluoroscopic guidance, an 18 gauge trocar needle was carefully advanced through the sacral spinous ligament and into the deep pelvic fluid collection. Aspiration through the needle return thick, foul-smelling purulent fluid. Wire was then coiled within the fluid collection and the tract serially dilated to 14 [place]. A Cook 14 [place] all-purpose drain was then advanced over the wire and formed with the locking loop in the fluid collection. A total of 80 mL of thick, foul-smelling purulent material was aspirated. A sample was sent for culture. The tube was secured to the skin with 0 Prolene suture and an adhesive fixation device. A sterile bandage was applied.  The patient tolerated the procedure very well ; there was no immediate complication.  ANESTHESIA/SEDATION: Moderate (conscious) sedation was used. 1.5 mg Versed, 50 mcg Fentanyl were administered intravenously. The patient's vital signs were monitored continuously by radiology nursing throughout the procedure.  Sedation Time: 20 minutes  CONTRAST:  None  FLUOROSCOPY TIME:  None  PROCEDURE: 1. CT-guided drain placement Interventional Radiologist:  Sterling Big, MD  IMPRESSION: Successful placement of a 25 French percutaneous drain into the putative diverticular abscess via  a right trans gluteal approach.  80 mL of thick, foul smelling purulent fluid was aspirated. A sample was sent for culture.  Recommend maintaining tube to JP bulb suction for at least 24-48 hr. Convert to gravity bag drainage prior to discharge to minimize risk of fistula formation. Follow up CT scan of the pelvis and tube injection under fluoroscopy prior 2 drain removal.  Signed,  Sterling Big, MD  [facility]  [facility]   Electronically Signed   By: Malachy Moan M.D.   On: 10/02/2013 12:12     Anti-infectives: Anti-infectives   Start     Dose/Rate Route Frequency Ordered Stop   10/01/13 1900  piperacillin-tazobactam (ZOSYN) IVPB 3.375 g     3.375 g 12.5 mL/hr over 240 Minutes Intravenous Every 8 hours 10/01/13 1755         Assessment/Plan 1. ? Diverticulitis with pelvic abscesses, s/p perc drain 2. Chronic cholecystitis vs gallbladder cancer   Plan: 1. Cont with abx and drain.  She will eventually need a repeat CT scan to determine when drain can be removed. 2. She will need outpatient colonoscopy 3. She will need outpatient follow up as well for her gallbladder 4. Advance to low fiber diet today.  LOS: 3 days    Coolidge Gossard E 10/04/2013, 8:57 AM Pager: [phone]

## 2013-10-04 NOTE — Progress Notes (Signed)
Subjective: No abd pain. Taking liquid diet OK. No breathing issues No craving for nicotine   Objective: Vital signs in last 24 hours: Temp:  [97.7 F (36.5 C)-99.1 F (37.3 C)] 99.1 F (37.3 C) (10/20 0551) Pulse Rate:  [68-88] 80 (10/20 0551) Resp:  [16-18] 18 (10/20 0551) BP: (104-142)/(53-71) 142/71 mmHg (10/20 0551) SpO2:  [95 %-96 %] 96 % (10/20 0551)  Intake/Output from previous day: 10/19 0701 - 10/20 0700 In: 480 [P.O.:480] Out: 10 [Drains:10] Intake/Output this shift:    Non toxic, lying flat in no distress. Oral membranes moist. Lungs clear no wheeze. Ht regular,a bit hyperdynamic. abd non tender, diminished BS's. Extrems, no edema. Awake. Alert in good spirits  Lab Results   Recent Labs  10/03/13 0612 10/04/13 0630  WBC 8.0 6.6  RBC 3.17* 3.41*  HGB 10.6* 11.3*  HCT 31.1* 33.3*  MCV 98.1 97.7  MCH 33.4 33.1  RDW 13.0 13.0  PLT 279 262    Recent Labs  10/02/13 0500 10/03/13 0612  NA 139 138  K 3.1* 3.5  CL 102 104  CO2 20 20  GLUCOSE 65* 78  BUN 7 3*  CREATININE 0.58 0.51  CALCIUM 7.9* 7.9*    Studies/Results: Mr 3d Recon At Scanner  10/03/2013   CLINICAL DATA:  Evaluate abnormal gallbladder demonstrated on recent CT scan.  EXAM: MRI ABDOMEN WITHOUT AND WITH CONTRAST (INCLUDING MRCP)  TECHNIQUE: Multiplanar multisequence MR imaging of the abdomen was performed both before and after the administration of intravenous contrast. Heavily T2-weighted images of the biliary and pancreatic ducts were obtained, and three-dimensional MRCP images were rendered by post processing.  CONTRAST:  10mL MULTIHANCE GADOBENATE DIMEGLUMINE 529 MG/ML IV SOLN  COMPARISON:  CT scan 03/14/2009 and 10/01/2013  FINDINGS: The gallbladder demonstrates marked wall thickening and enhancement postcontrast. There are multiple areas of cystic change in the gallbladder wall and numerous gallstones. I do not see a discrete enhancing mass to suggest gallbladder carcinoma. I think the  findings are much more likely due to chronic cholecystitis. No findings for involvement/ invasion of the liver or duodenum. There are multiple benign appearing stable hepatic cysts. The common bile duct is normal in caliber and has a normal course. The pancreatic duct is normal. The pancreas is unremarkable. The spleen is normal in size. No focal lesions. The adrenal glands are slightly enlarged and nodular suggesting nodular hyperplasia. The kidneys are unremarkable.  Mildly dilated small bowel loops and colon may be to a diffuse ileus.  No significant bony findings.  IMPRESSION: MR findings most consistent with chronic calculus cholecystitis. No definite gallbladder mass or involvement of the liver or duodenum.  Normal pancreaticobiliary tree.   Electronically Signed   By: Loralie Champagne M.D.   On: 10/03/2013 11:12   Mr Abd W/wo Cm/mrcp  10/03/2013   CLINICAL DATA:  Evaluate abnormal gallbladder demonstrated on recent CT scan.  EXAM: MRI ABDOMEN WITHOUT AND WITH CONTRAST (INCLUDING MRCP)  TECHNIQUE: Multiplanar multisequence MR imaging of the abdomen was performed both before and after the administration of intravenous contrast. Heavily T2-weighted images of the biliary and pancreatic ducts were obtained, and three-dimensional MRCP images were rendered by post processing.  CONTRAST:  10mL MULTIHANCE GADOBENATE DIMEGLUMINE 529 MG/ML IV SOLN  COMPARISON:  CT scan 03/14/2009 and 10/01/2013  FINDINGS: The gallbladder demonstrates marked wall thickening and enhancement postcontrast. There are multiple areas of cystic change in the gallbladder wall and numerous gallstones. I do not see a discrete enhancing mass to suggest gallbladder carcinoma.  I think the findings are much more likely due to chronic cholecystitis. No findings for involvement/ invasion of the liver or duodenum. There are multiple benign appearing stable hepatic cysts. The common bile duct is normal in caliber and has a normal course. The pancreatic  duct is normal. The pancreas is unremarkable. The spleen is normal in size. No focal lesions. The adrenal glands are slightly enlarged and nodular suggesting nodular hyperplasia. The kidneys are unremarkable.  Mildly dilated small bowel loops and colon may be to a diffuse ileus.  No significant bony findings.  IMPRESSION: MR findings most consistent with chronic calculus cholecystitis. No definite gallbladder mass or involvement of the liver or duodenum.  Normal pancreaticobiliary tree.   Electronically Signed   By: Loralie Champagne M.D.   On: 10/03/2013 11:12   Ct Image Guided Fluid Drain By Catheter  10/02/2013   CLINICAL DATA:  78 year old female with abdominal pain and the pelvic abscess on recent CT scan. She presents for percutaneous drainage.  EXAM: RADIOLOGY EXAMINATION  Date: 10/02/2013  TECHNIQUE: Informed consent was obtained from the patient following explanation of the procedure, risks, benefits and alternatives. The patient understands, agrees and consents for the procedure. All questions were addressed. A time out was performed.  Maximal barrier sterile technique utilized including caps, mask, sterile gowns, sterile gloves, large sterile drape, hand hygiene, and the Betadine skin prep.  The patient was placed in the prone position. A planning axial CT scan was performed. A suitable skin entry site was selected and marked. Local anesthesia was attained by infiltration with 1% lidocaine. Under CT fluoroscopic guidance, an 18 gauge trocar needle was carefully advanced through the sacral spinous ligament and into the deep pelvic fluid collection. Aspiration through the needle return thick, foul-smelling purulent fluid. Wire was then coiled within the fluid collection and the tract serially dilated to 14 [place]. A Cook 14 [place] all-purpose drain was then advanced over the wire and formed with the locking loop in the fluid collection. A total of 80 mL of thick, foul-smelling purulent material  was aspirated. A sample was sent for culture. The tube was secured to the skin with 0 Prolene suture and an adhesive fixation device. A sterile bandage was applied.  The patient tolerated the procedure very well ; there was no immediate complication.  ANESTHESIA/SEDATION: Moderate (conscious) sedation was used. 1.5 mg Versed, 50 mcg Fentanyl were administered intravenously. The patient's vital signs were monitored continuously by radiology nursing throughout the procedure.  Sedation Time: 20 minutes  CONTRAST:  None  FLUOROSCOPY TIME:  None  PROCEDURE: 1. CT-guided drain placement Interventional Radiologist:  Sterling Big, MD  IMPRESSION: Successful placement of a 73 French percutaneous drain into the putative diverticular abscess via a right trans gluteal approach.  80 mL of thick, foul smelling purulent fluid was aspirated. A sample was sent for culture.  Recommend maintaining tube to JP bulb suction for at least 24-48 hr. Convert to gravity bag drainage prior to discharge to minimize risk of fistula formation. Follow up CT scan of the pelvis and tube injection under fluoroscopy prior 2 drain removal.  Signed,  Sterling Big, MD  [facility]  [facility]   Electronically Signed   By: Malachy Moan M.D.   On: 10/02/2013 12:12    Scheduled Meds: . busPIRone  10 mg Oral BID  . levothyroxine  75 mcg Oral QAC breakfast  . lidocaine (PF)  10 mL Other Once  . losartan  50 mg Oral Daily  . multivitamin with minerals  1 tablet Oral Daily  . piperacillin-tazobactam (ZOSYN)  IV  3.375 g Intravenous Q8H   Continuous Infusions: . sodium chloride 100 mL/hr at 10/04/13 0549   PRN Meds:HYDROmorphone (DILAUDID) injection, temazepam  Assessment/Plan:  Pelvic abscesses-presumably secondary to diverticulitis, status post successful percutaneous drainage yesterday with purulent discharge sent off for culture, initial results revealed gram-negative rods,  consistent with diverticulitis, continues on Zosyn, patient afebrile, white blood cell count normal,  Tmax 99 WBC fine  Gall bladder abnormality based on CT scan with MRI consistent with extensive chronic cholecystitis,: timing of intervention uncertain: "MRI: MR findings most consistent with chronic calculus cholecystitis. No definite gallbladder mass or involvement of the liver or duodenum.  Normal pancreaticobiliary tree.   Tobacco abuse- no withdrawal \\Alcohol  use- No withdrawal  Hypokalemia-will replete-fine at 3.5 Pain management started allotted, no adverse side effects with first dose  Hypothyroidism-on replacement  Hypertension well controlled on ARB therapy Protein Calorie Malnutrition: Alb only 1.9, quite a ways to go    LOS: 3 days   Brylan Dec ALAN 10/04/2013, 8:21 AM

## 2013-10-04 NOTE — Evaluation (Signed)
Occupational Therapy Evaluation Patient Details Name: [name] MRN: [id number] DOB: 1935/06/27 Today's Date: 10/04/2013 Time: 4098-1191 OT Time Calculation (min): 29 min  OT Assessment / Plan / Recommendation History of present illness Pt admitted with abdominal pain due to pelvic abscess due to diverticulitis, chronic cholecystitis.   Clinical Impression   Pt presents with generalized weakness interfering with ability to perform OOB mobility and self care.  Instructed pt in the importance of mobility and OOB activity.  Should progress to be able to return home with her husband.    OT Assessment  Patient needs continued OT Services    Follow Up Recommendations  No OT follow up;Supervision/Assistance - 24 hour    Barriers to Discharge      Equipment Recommendations       Recommendations for Other Services    Frequency  Min 2X/week    Precautions / Restrictions Precautions Precautions: Fall   Pertinent Vitals/Pain VSS, no pain    ADL  Eating/Feeding: Independent Where Assessed - Eating/Feeding: Edge of bed Grooming: Wash/dry hands;Min guard Where Assessed - Grooming: Unsupported standing Upper Body Bathing: Set up Where Assessed - Upper Body Bathing: Unsupported sitting Lower Body Bathing: Min guard Where Assessed - Lower Body Bathing: Unsupported sitting;Supported sit to stand Upper Body Dressing: Set up Where Assessed - Upper Body Dressing: Unsupported sitting Lower Body Dressing: Min guard Where Assessed - Lower Body Dressing: Unsupported sitting;Supported sit to stand Toilet Transfer: Minimal assistance Toilet Transfer Method: Sit to stand Toilet Transfer Equipment: Comfort height toilet;Grab bars Toileting - Clothing Manipulation and Hygiene: Supervision/safety Where Assessed - Engineer, mining and Hygiene: Sit to stand from 3-in-1 or toilet Equipment Used: Gait belt Transfers/Ambulation Related to ADLs: min guard assist holding IV  pole ADL Comments: Pt instructed to bring her foot to her to donn socks.  Educated in importance of OOB activity.    OT Diagnosis: Generalized weakness  OT Problem List: Decreased strength;Decreased activity tolerance;Impaired balance (sitting and/or standing);Decreased knowledge of use of DME or AE OT Treatment Interventions: Self-care/ADL training;DME and/or AE instruction;Patient/family education;Balance training;Energy conservation   OT Goals(Current goals can be found in the care plan section) Acute Rehab OT Goals Patient Stated Goal: To get stronger OT Goal Formulation: With patient Time For Goal Achievement: 10/18/13 Potential to Achieve Goals: Good ADL Goals Pt Will Perform Grooming: with modified independence;standing Pt Will Perform Lower Body Bathing: with modified independence;sit to/from stand Pt Will Perform Lower Body Dressing: with modified independence;sit to/from stand Pt Will Transfer to Toilet: with modified independence;ambulating Pt Will Perform Toileting - Clothing Manipulation and hygiene: with modified independence;sit to/from stand Pt Will Perform Tub/Shower Transfer: Shower transfer;with supervision;ambulating  Visit Information  Last OT Received On: 10/04/13 Assistance Needed: +1 History of Present Illness: Pt admitted with abdominal pain due to pelvic abscess due to diverticulitis, chronic cholecystitis.       Prior Functioning     Home Living Family/patient expects to be discharged to:: Private residence Living Arrangements: Spouse/significant other Available Help at Discharge: Family;Available 24 hours/day Type of Home: House Home Access: Stairs to enter [employer] of Steps: 6 Entrance Stairs-Rails: Right;Left Home Layout: Two level;Able to live on main level with bedroom/bathroom Home Equipment: None Prior Function Level of Independence: Independent Communication Communication: No difficulties Dominant Hand: Left          Vision/Perception Vision - History Baseline Vision: Wears glasses all the time Patient Visual Report: No change from baseline   Cognition  Cognition Arousal/Alertness: Awake/alert Behavior During Therapy:  WFL for tasks assessed/performed Overall Cognitive Status: Within Functional Limits for tasks assessed    Extremity/Trunk Assessment Upper Extremity Assessment Upper Extremity Assessment: Generalized weakness Lower Extremity Assessment Lower Extremity Assessment: Generalized weakness     Mobility Bed Mobility Bed Mobility: Supine to Sit;Sit to Supine Supine to Sit: HOB elevated;5: Supervision Sit to Supine: HOB elevated;5: Supervision Transfers Transfers: Sit to Stand;Stand to Sit Sit to Stand: 4: Min assist;From bed;4: Min guard;From toilet Stand to Sit: 4: Min guard;To bed;To toilet Details for Transfer Assistance: assist for balance     Exercise     Balance Balance Balance Assessed: Yes Static Sitting Balance Static Sitting - Balance Support: Feet supported Static Sitting - Level of Assistance: 7: Independent Static Standing Balance Static Standing - Balance Support: No upper extremity supported Static Standing - Level of Assistance: 5: Stand by assistance Static Standing - Comment/# of Minutes: 1   End of Session OT - End of Session Activity Tolerance: Patient limited by fatigue Patient left: in bed;with call bell/phone within reach  GO     Evern Bio 10/04/2013, 12:11 PM [phone]

## 2013-10-05 LAB — HEPATIC FUNCTION PANEL
ALT: 103 U/L — ABNORMAL HIGH (ref 0–35)
AST: 201 U/L — ABNORMAL HIGH (ref 0–37)
Albumin: 1.9 g/dL — ABNORMAL LOW (ref 3.5–5.2)
Alkaline Phosphatase: 389 U/L — ABNORMAL HIGH (ref 39–117)
Bilirubin, Direct: 0.4 mg/dL — ABNORMAL HIGH (ref 0.0–0.3)
Indirect Bilirubin: 0.4 mg/dL (ref 0.3–0.9)
Total Bilirubin: 0.8 mg/dL (ref 0.3–1.2)
Total Protein: 4.8 g/dL — ABNORMAL LOW (ref 6.0–8.3)

## 2013-10-05 LAB — BASIC METABOLIC PANEL
BUN: <3 mg/dL — ABNORMAL LOW (ref 6–23)
CO2: 24 mEq/L (ref 19–32)
Calcium: 7.9 mg/dL — ABNORMAL LOW (ref 8.4–10.5)
Chloride: 101 mEq/L (ref 96–112)
Creatinine, Ser: 0.54 mg/dL (ref 0.50–1.10)
GFR calc Af Amer: >90 mL/min (ref >90)
GFR calc non Af Amer: 89 mL/min — ABNORMAL LOW (ref >90)
Glucose, Bld: 105 mg/dL — ABNORMAL HIGH (ref 70–99)
Potassium: 3.6 mEq/L (ref 3.5–5.1)
Sodium: 135 mEq/L (ref 135–145)

## 2013-10-05 LAB — CBC
HCT: 33.4 % — ABNORMAL LOW (ref 36.0–46.0)
Hemoglobin: 11.3 g/dL — ABNORMAL LOW (ref 12.0–15.0)
MCH: 33 pg (ref 26.0–34.0)
MCHC: 33.8 g/dL (ref 30.0–36.0)
MCV: 97.7 fL (ref 78.0–100.0)
Platelets: 260 10*3/uL (ref 150–400)
RBC: 3.42 MIL/uL — ABNORMAL LOW (ref 3.87–5.11)
RDW: 13.1 % (ref 11.5–15.5)
WBC: 11.9 10*3/uL — ABNORMAL HIGH (ref 4.0–10.5)

## 2013-10-05 MED ORDER — ONDANSETRON HCL 4 MG/2ML IJ SOLN
4.0000 mg | Freq: Four times a day (QID) | INTRAMUSCULAR | Status: DC | PRN
  Administered 2013-10-05 – 2013-10-08 (×2): 4 mg via INTRAMUSCULAR
  Filled 2013-10-05 (×2): qty 2

## 2013-10-05 MED ORDER — DIPHENHYDRAMINE HCL 25 MG PO CAPS
25.0000 mg | ORAL_CAPSULE | Freq: Three times a day (TID) | ORAL | Status: DC | PRN
  Administered 2013-10-05 – 2013-10-07 (×5): 25 mg via ORAL
  Filled 2013-10-05 (×5): qty 1

## 2013-10-05 NOTE — Progress Notes (Signed)
Patient ID: [name], female   DOB: [birth year], 77 y.o.   MRN: [id number] Patient ID: [name], female   DOB: April 13, 1935, 78 y.o.   MRN: [id number]    Subjective: Pt feels ok today.  No pain at all.  Not much appetite yesterday but looking forward to solid food today. He had a bowel movement yesterday.  Objective: Vital signs in last 24 hours: Temp:  [98.3 F (36.8 C)-98.6 F (37 C)] 98.3 F (36.8 C) (10/21 0515) Pulse Rate:  [76-82] 76 (10/21 0515) Resp:  [18] 18 (10/21 0515) BP: (108-123)/(58-72) 108/72 mmHg (10/21 0515) SpO2:  [95 %-96 %] 95 % (10/21 0515) Last BM Date: 10/05/13  Intake/Output from previous day: 10/20 0701 - 10/21 0700 In: 360 [P.O.:350] Out: 251 [Urine:201; Drains:50] Intake/Output this shift:    PE: Abd: soft, NT, ND, +BS, JP drain in place in right gluteus with cloudy thin serosang output Heart: regular Lungs: CTAB  Lab Results:   Recent Labs  10/04/13 0630 10/05/13 0519  WBC 6.6 11.9*  HGB 11.3* 11.3*  HCT 33.3* 33.4*  PLT 262 260   BMET  Recent Labs  10/04/13 0630 10/05/13 0519  NA 137 135  K 3.4* 3.6  CL 102 101  CO2 23 24  GLUCOSE 91 105*  BUN <3* <3*  CREATININE 0.58 0.54  CALCIUM 8.0* 7.9*   PT/INR No results found for this basename: LABPROT, INR,  in the last 72 hours CMP     Component Value Date/Time   NA 135 10/05/2013 0519   K 3.6 10/05/2013 0519   CL 101 10/05/2013 0519   CO2 24 10/05/2013 0519   GLUCOSE 105* 10/05/2013 0519   BUN <3* 10/05/2013 0519   CREATININE 0.54 10/05/2013 0519   CALCIUM 7.9* 10/05/2013 0519   PROT 4.8* 10/05/2013 0519   ALBUMIN 1.9* 10/05/2013 0519   AST 201* 10/05/2013 0519   ALT 103* 10/05/2013 0519   ALKPHOS 389* 10/05/2013 0519   BILITOT 0.8 10/05/2013 0519   GFRNONAA 89* 10/05/2013 0519   GFRAA >90 10/05/2013 0519   Lipase  No results found for this basename: lipase       Studies/Results: Mr 3d Recon At Scanner  10/03/2013   CLINICAL DATA:  Evaluate  abnormal gallbladder demonstrated on recent CT scan.  EXAM: MRI ABDOMEN WITHOUT AND WITH CONTRAST (INCLUDING MRCP)  TECHNIQUE: Multiplanar multisequence MR imaging of the abdomen was performed both before and after the administration of intravenous contrast. Heavily T2-weighted images of the biliary and pancreatic ducts were obtained, and three-dimensional MRCP images were rendered by post processing.  CONTRAST:  10mL MULTIHANCE GADOBENATE DIMEGLUMINE 529 MG/ML IV SOLN  COMPARISON:  CT scan 03/14/2009 and 10/01/2013  FINDINGS: The gallbladder demonstrates marked wall thickening and enhancement postcontrast. There are multiple areas of cystic change in the gallbladder wall and numerous gallstones. I do not see a discrete enhancing mass to suggest gallbladder carcinoma. I think the findings are much more likely due to chronic cholecystitis. No findings for involvement/ invasion of the liver or duodenum. There are multiple benign appearing stable hepatic cysts. The common bile duct is normal in caliber and has a normal course. The pancreatic duct is normal. The pancreas is unremarkable. The spleen is normal in size. No focal lesions. The adrenal glands are slightly enlarged and nodular suggesting nodular hyperplasia. The kidneys are unremarkable.  Mildly dilated small bowel loops and colon may be to a diffuse ileus.  No significant bony findings.  IMPRESSION: MR findings most  consistent with chronic calculus cholecystitis. No definite gallbladder mass or involvement of the liver or duodenum.  Normal pancreaticobiliary tree.   Electronically Signed   By: Loralie Champagne M.D.   On: 10/03/2013 11:12   Mr Abd W/wo Cm/mrcp  10/03/2013   CLINICAL DATA:  Evaluate abnormal gallbladder demonstrated on recent CT scan.  EXAM: MRI ABDOMEN WITHOUT AND WITH CONTRAST (INCLUDING MRCP)  TECHNIQUE: Multiplanar multisequence MR imaging of the abdomen was performed both before and after the administration of intravenous contrast.  Heavily T2-weighted images of the biliary and pancreatic ducts were obtained, and three-dimensional MRCP images were rendered by post processing.  CONTRAST:  10mL MULTIHANCE GADOBENATE DIMEGLUMINE 529 MG/ML IV SOLN  COMPARISON:  CT scan 03/14/2009 and 10/01/2013  FINDINGS: The gallbladder demonstrates marked wall thickening and enhancement postcontrast. There are multiple areas of cystic change in the gallbladder wall and numerous gallstones. I do not see a discrete enhancing mass to suggest gallbladder carcinoma. I think the findings are much more likely due to chronic cholecystitis. No findings for involvement/ invasion of the liver or duodenum. There are multiple benign appearing stable hepatic cysts. The common bile duct is normal in caliber and has a normal course. The pancreatic duct is normal. The pancreas is unremarkable. The spleen is normal in size. No focal lesions. The adrenal glands are slightly enlarged and nodular suggesting nodular hyperplasia. The kidneys are unremarkable.  Mildly dilated small bowel loops and colon may be to a diffuse ileus.  No significant bony findings.  IMPRESSION: MR findings most consistent with chronic calculus cholecystitis. No definite gallbladder mass or involvement of the liver or duodenum.  Normal pancreaticobiliary tree.   Electronically Signed   By: Loralie Champagne M.D.   On: 10/03/2013 11:12    Anti-infectives: Anti-infectives   Start     Dose/Rate Route Frequency Ordered Stop   10/01/13 1900  piperacillin-tazobactam (ZOSYN) IVPB 3.375 g     3.375 g 12.5 mL/hr over 240 Minutes Intravenous Every 8 hours 10/01/13 1755         Assessment/Plan 1. ? Diverticulitis with pelvic abscesses, s/p perc drain 2. Chronic cholecystitis vs gallbladder cancer   Overall improving but slight elevation in white count today.  Plan: 1. Cont with abx and drain.  She will  need a repeat CT scan to determine when drain can be removed. Plan toward the end of this week 2.  She will need outpatient colonoscopy 3. She will need outpatient follow up as well for her gallbladder 4. Advance to low fiber diet today.  LOS: 4 days    Rayquon Uselman T 10/05/2013, 7:59 AM

## 2013-10-05 NOTE — Progress Notes (Signed)
Subjective: Feeling pretty well. Some abd pain at times. No N/V. No chills or sweats. No fever but WBC now 11.9   Objective: Vital signs in last 24 hours: Temp:  [98.3 F (36.8 C)-98.6 F (37 C)] 98.3 F (36.8 C) (10/21 0515) Pulse Rate:  [76-82] 76 (10/21 0515) Resp:  [18] 18 (10/21 0515) BP: (108-123)/(58-72) 108/72 mmHg (10/21 0515) SpO2:  [95 %-96 %] 95 % (10/21 0515)  Intake/Output from previous day: 10/20 0701 - 10/21 0700 In: 360 [P.O.:350] Out: 251 [Urine:201; Drains:50] Intake/Output this shift:    Non toxic, anicteric. Lungs clear. Ht regular abd min distention, minor tenderness. Better BS's extrem no edema. Awake. Alert, no tremor  Lab Results   Recent Labs  10/04/13 0630 10/05/13 0519  WBC 6.6 11.9*  RBC 3.41* 3.42*  HGB 11.3* 11.3*  HCT 33.3* 33.4*  MCV 97.7 97.7  MCH 33.1 33.0  RDW 13.0 13.1  PLT 262 260    Recent Labs  10/04/13 0630 10/05/13 0519  NA 137 135  K 3.4* 3.6  CL 102 101  CO2 23 24  GLUCOSE 91 105*  BUN <3* <3*  CREATININE 0.58 0.54  CALCIUM 8.0* 7.9*    Studies/Results: Mr 3d Recon At Scanner  10/03/2013   CLINICAL DATA:  Evaluate abnormal gallbladder demonstrated on recent CT scan.  EXAM: MRI ABDOMEN WITHOUT AND WITH CONTRAST (INCLUDING MRCP)  TECHNIQUE: Multiplanar multisequence MR imaging of the abdomen was performed both before and after the administration of intravenous contrast. Heavily T2-weighted images of the biliary and pancreatic ducts were obtained, and three-dimensional MRCP images were rendered by post processing.  CONTRAST:  10mL MULTIHANCE GADOBENATE DIMEGLUMINE 529 MG/ML IV SOLN  COMPARISON:  CT scan 03/14/2009 and 10/01/2013  FINDINGS: The gallbladder demonstrates marked wall thickening and enhancement postcontrast. There are multiple areas of cystic change in the gallbladder wall and numerous gallstones. I do not see a discrete enhancing mass to suggest gallbladder carcinoma. I think the findings are much more  likely due to chronic cholecystitis. No findings for involvement/ invasion of the liver or duodenum. There are multiple benign appearing stable hepatic cysts. The common bile duct is normal in caliber and has a normal course. The pancreatic duct is normal. The pancreas is unremarkable. The spleen is normal in size. No focal lesions. The adrenal glands are slightly enlarged and nodular suggesting nodular hyperplasia. The kidneys are unremarkable.  Mildly dilated small bowel loops and colon may be to a diffuse ileus.  No significant bony findings.  IMPRESSION: MR findings most consistent with chronic calculus cholecystitis. No definite gallbladder mass or involvement of the liver or duodenum.  Normal pancreaticobiliary tree.   Electronically Signed   By: Loralie Champagne M.D.   On: 10/03/2013 11:12   Mr Abd W/wo Cm/mrcp  10/03/2013   CLINICAL DATA:  Evaluate abnormal gallbladder demonstrated on recent CT scan.  EXAM: MRI ABDOMEN WITHOUT AND WITH CONTRAST (INCLUDING MRCP)  TECHNIQUE: Multiplanar multisequence MR imaging of the abdomen was performed both before and after the administration of intravenous contrast. Heavily T2-weighted images of the biliary and pancreatic ducts were obtained, and three-dimensional MRCP images were rendered by post processing.  CONTRAST:  10mL MULTIHANCE GADOBENATE DIMEGLUMINE 529 MG/ML IV SOLN  COMPARISON:  CT scan 03/14/2009 and 10/01/2013  FINDINGS: The gallbladder demonstrates marked wall thickening and enhancement postcontrast. There are multiple areas of cystic change in the gallbladder wall and numerous gallstones. I do not see a discrete enhancing mass to suggest gallbladder carcinoma. I think the  findings are much more likely due to chronic cholecystitis. No findings for involvement/ invasion of the liver or duodenum. There are multiple benign appearing stable hepatic cysts. The common bile duct is normal in caliber and has a normal course. The pancreatic duct is normal. The  pancreas is unremarkable. The spleen is normal in size. No focal lesions. The adrenal glands are slightly enlarged and nodular suggesting nodular hyperplasia. The kidneys are unremarkable.  Mildly dilated small bowel loops and colon may be to a diffuse ileus.  No significant bony findings.  IMPRESSION: MR findings most consistent with chronic calculus cholecystitis. No definite gallbladder mass or involvement of the liver or duodenum.  Normal pancreaticobiliary tree.   Electronically Signed   By: Loralie Champagne M.D.   On: 10/03/2013 11:12    Scheduled Meds: . busPIRone  10 mg Oral BID  . feeding supplement (ENSURE COMPLETE)  237 mL Oral BID BM  . levothyroxine  75 mcg Oral QAC breakfast  . lidocaine (PF)  10 mL Other Once  . losartan  50 mg Oral Daily  . multivitamin with minerals  1 tablet Oral Daily  . piperacillin-tazobactam (ZOSYN)  IV  3.375 g Intravenous Q8H  . potassium chloride  20 mEq Oral BID   Continuous Infusions: . sodium chloride 20 mL/hr (10/05/13 0216)   PRN Meds:HYDROmorphone (DILAUDID) injection, temazepam, traMADol  Assessment/Plan:  Pelvic abscesses-presumably secondary to diverticulitis, status post successful percutaneous drainage  WBC up some. No fever. Repest scan at end of the week Gall bladder abnormality based on CT scan with MRI consistent with extensive chronic cholecystitis,: timing of intervention uncertain: "MRI: MR findings most consistent with chronic calculus cholecystitis. No definite gallbladder mass or involvement of the liver or duodenum. Normal pancreaticobiliary tree.  Tobacco abuse- no withdrawal  \Alcohol use- No withdrawal  Hypokalemia-will replete-fine at 3.5  Pain management started allotted, no adverse side effects with first dose  Hypothyroidism-on replacement  Hypertension well controlled on ARB therapy  Protein Calorie Malnutrition: Alb only 1.9, quite a ways to go    LOS: 4 days   [name] ALAN 10/05/2013, 8:30 AM

## 2013-10-05 NOTE — Progress Notes (Signed)
Subjective: Pt feeling ok. Moving bowels. Somewhat formed. Tol diet advanced diet. Reports mild soreness at drain site.  Objective: Physical Exam: BP 108/72  Pulse 76  Temp(Src) 98.3 F (36.8 C) (Oral)  Resp 18  Ht 5' 2.5" (1.588 m)  Wt 103 lb (46.72 kg)  BMI 18.53 kg/m2  SpO2 95% Rt transgluteal drain intact, site clean. Serosanguinous output, 50cc recorded yesterday.   Labs: CBC  Recent Labs  10/04/13 0630 10/05/13 0519  WBC 6.6 11.9*  HGB 11.3* 11.3*  HCT 33.3* 33.4*  PLT 262 260   BMET  Recent Labs  10/04/13 0630 10/05/13 0519  NA 137 135  K 3.4* 3.6  CL 102 101  CO2 23 24  GLUCOSE 91 105*  BUN <3* <3*  CREATININE 0.58 0.54  CALCIUM 8.0* 7.9*   LFT  Recent Labs  10/05/13 0519  PROT 4.8*  ALBUMIN 1.9*  AST 201*  ALT 103*  ALKPHOS 389*  BILITOT 0.8  BILIDIR 0.4*  IBILI 0.4   PT/INR No results found for this basename: LABPROT, INR,  in the last 72 hours   Studies/Results: No results found.  Assessment/Plan: S/p perc pelvic abscess drain from suspected diverticulitis Cx= E. Coli Recommend repeat CT abdomen/pelvis +/- drain injection in next few days to reassess.     LOS: 4 days    Brayton El PA-C 10/05/2013 9:28 AM

## 2013-10-05 NOTE — Progress Notes (Signed)
Physical Therapy Treatment Patient Details Name: ELONDA GIULIANO MRN: [id number] DOB: [birth year] Today's Date: 10/05/2013 Time: 9147-8295 PT Time Calculation (min): 16 min  PT Assessment / Plan / Recommendation  History of Present Illness Pt admitted with abdominal pain due to pelvic abscess due to diverticulitis, chronic cholecystitis.   PT Comments   Patient feeling more energetic today with ambulation. Needed no assistance. Increased balance. Continue with current POC and attempt steps next session  Follow Up Recommendations  Home health PT;Supervision/Assistance - 24 hour     Does the patient have the potential to tolerate intense rehabilitation     Barriers to Discharge        Equipment Recommendations  None recommended by PT    Recommendations for Other Services    Frequency Min 3X/week   Progress towards PT Goals Progress towards PT goals: Progressing toward goals  Plan Current plan remains appropriate    Precautions / Restrictions Precautions Precautions: Fall   Pertinent Vitals/Pain Requested pain meds. 3/10 abdominal pain    Mobility  Bed Mobility Supine to Sit: 6: Modified independent (Device/Increase time) Sit to Supine: 6: Modified independent (Device/Increase time) Transfers Sit to Stand: 4: Min guard;From bed;From toilet Stand to Sit: 4: Min guard;To bed;To toilet Details for Transfer Assistance: MinGuard for safety Ambulation/Gait Ambulation/Gait Assistance: 4: Min guard Ambulation Distance (Feet): 300 Feet Assistive device: None Ambulation/Gait Assistance Details: Patient walked without IV pole today. Increased balance this session.  Gait Pattern: Step-through pattern;Decreased stride length Gait velocity: Slow gait speed    Exercises     PT Diagnosis:    PT Problem List:   PT Treatment Interventions:     PT Goals (current goals can now be found in the care plan section)    Visit Information  Last PT Received On: 10/05/13 Assistance  Needed: +1 History of Present Illness: Pt admitted with abdominal pain due to pelvic abscess due to diverticulitis, chronic cholecystitis.    Subjective Data      Cognition  Cognition Arousal/Alertness: Awake/alert Behavior During Therapy: WFL for tasks assessed/performed Overall Cognitive Status: Within Functional Limits for tasks assessed    Balance  Static Standing Balance Static Standing - Balance Support: No upper extremity supported Static Standing - Level of Assistance: 5: Stand by assistance  End of Session PT - End of Session Equipment Utilized During Treatment: Gait belt Activity Tolerance: Patient tolerated treatment well Patient left: in bed;with call bell/phone within reach Nurse Communication: Mobility status   GP     Fredrich Birks 10/05/2013, 2:44 PM 10/05/2013 Fredrich Birks PTA [phone] pager [phone] office

## 2013-10-06 DIAGNOSIS — K5720 Diverticulitis of large intestine with perforation and abscess without bleeding: Secondary | ICD-10-CM | POA: Diagnosis present

## 2013-10-06 LAB — CBC
HCT: 34 % — ABNORMAL LOW (ref 36.0–46.0)
Hemoglobin: 11.5 g/dL — ABNORMAL LOW (ref 12.0–15.0)
MCH: 33.2 pg (ref 26.0–34.0)
MCHC: 33.8 g/dL (ref 30.0–36.0)
MCV: 98.3 fL (ref 78.0–100.0)
Platelets: 281 10*3/uL (ref 150–400)
RBC: 3.46 MIL/uL — ABNORMAL LOW (ref 3.87–5.11)
RDW: 13.3 % (ref 11.5–15.5)
WBC: 9.5 10*3/uL (ref 4.0–10.5)

## 2013-10-06 LAB — HEPATIC FUNCTION PANEL
ALT: 155 U/L — ABNORMAL HIGH (ref 0–35)
AST: 121 U/L — ABNORMAL HIGH (ref 0–37)
Albumin: 1.9 g/dL — ABNORMAL LOW (ref 3.5–5.2)
Alkaline Phosphatase: 424 U/L — ABNORMAL HIGH (ref 39–117)
Bilirubin, Direct: 0.2 mg/dL (ref 0.0–0.3)
Indirect Bilirubin: 0.3 mg/dL (ref 0.3–0.9)
Total Bilirubin: 0.5 mg/dL (ref 0.3–1.2)
Total Protein: 5 g/dL — ABNORMAL LOW (ref 6.0–8.3)

## 2013-10-06 LAB — BASIC METABOLIC PANEL
BUN: <3 mg/dL — ABNORMAL LOW (ref 6–23)
CO2: 27 mEq/L (ref 19–32)
Calcium: 8.1 mg/dL — ABNORMAL LOW (ref 8.4–10.5)
Chloride: 99 mEq/L (ref 96–112)
Creatinine, Ser: 0.52 mg/dL (ref 0.50–1.10)
GFR calc Af Amer: >90 mL/min (ref >90)
GFR calc non Af Amer: >90 mL/min (ref >90)
Glucose, Bld: 95 mg/dL (ref 70–99)
Potassium: 3.8 mEq/L (ref 3.5–5.1)
Sodium: 136 mEq/L (ref 135–145)

## 2013-10-06 NOTE — Progress Notes (Signed)
Subjective: Patient looks good sitting up in bed having about 90% of her breakfast. She denies any discomfort. She's feeling starving has been walking.  Objective: Vital signs in last 24 hours: Temp:  [97.8 F (36.6 C)-97.9 F (36.6 C)] 97.9 F (36.6 C) (10/22 0520) Pulse Rate:  [73-77] 73 (10/22 0520) Resp:  [20] 20 (10/22 0520) BP: (110-122)/(56-74) 110/56 mmHg (10/22 0520) SpO2:  [96 %-97 %] 96 % (10/22 0520) Weight change:   CBG (last 3)  No results found for this basename: GLUCAP,  in the last 72 hours  Intake/Output from previous day: 10/21 0701 - 10/22 0700 In: 5916.3 [P.O.:360; I.V.:5101.3; IV Piggyback:450] Out: 33 [Drains:33]  Physical Exam: Bright awake alert no distress smiling. Good facial symmetry. No JVD or bruits. Lungs are clear. Cardiovascular exam is regular no murmur. Abdomen is soft nontender good bowel sounds. Extremities no peripheral edema intact pulses. Neurologically she is nonlateralizing   Lab Results:  Recent Labs  10/05/13 0519 10/06/13 0428  NA 135 136  K 3.6 3.8  CL 101 99  CO2 24 27  GLUCOSE 105* 95  BUN <3* <3*  CREATININE 0.54 0.52  CALCIUM 7.9* 8.1*    Recent Labs  10/05/13 0519 10/06/13 0428  AST 201* 121*  ALT 103* 155*  ALKPHOS 389* 424*  BILITOT 0.8 0.5  PROT 4.8* 5.0*  ALBUMIN 1.9* 1.9*    Recent Labs  10/05/13 0519 10/06/13 0428  WBC 11.9* 9.5  HGB 11.3* 11.5*  HCT 33.4* 34.0*  MCV 97.7 98.3  PLT 260 281   No results found for this basename: INR, PROTIME   No results found for this basename: CKTOTAL, CKMB, CKMBINDEX, TROPONINI,  in the last 72 hours No results found for this basename: TSH, T4TOTAL, FREET3, T3FREE, THYROIDAB,  in the last 72 hours No results found for this basename: VITAMINB12, FOLATE, FERRITIN, TIBC, IRON, RETICCTPCT,  in the last 72 hours  Studies/Results: No results found.   Assessment/Plan: #1 pelvic abscess secondary to diverticulitis status post percutaneous drainage of white  blood cells decreasing IV antibiotics continue #2 gallbladder abnormality etiology unclear possible chronic cholecystitis to be resulted later date #3 essential hypertension excellent control #4 primary hypothyroidism replaced  Plan is to continue IV antibiotics, nutritional support and ambulation   LOS: 5 days   Dominico Rod A 10/06/2013, 8:19 AM

## 2013-10-06 NOTE — Progress Notes (Signed)
Patient ID: [name], female   DOB: [birth year], 78 y.o.   MRN: [id number]  Subjective: Pt denies abdominal pain, n/v.  Tolerating low fat diet.  Ambulating in hallways.  Had a bm yesterday.  Voiding without any difficulties.    Objective:  Vital signs:  Filed Vitals:   10/04/13 2156 10/05/13 0515 10/05/13 2111 10/06/13 0520  BP: 123/62 108/72 122/74 110/56  Pulse: 82 76 77 73  Temp: 98.6 F (37 C) 98.3 F (36.8 C) 97.8 F (36.6 C) 97.9 F (36.6 C)  TempSrc: Oral Oral Oral Oral  Resp: 18 18 20 20   Height:      Weight:      SpO2: 96% 95% 97% 96%    Last BM Date: 10/05/13  Intake/Output   Yesterday:  10/21 0701 - 10/22 0700 In: 5916.3 [P.O.:360; I.V.:5101.3; IV Piggyback:450] Out: 33 [Drains:33] This shift:     Physical Exam:  General: Pt awake/alert/oriented x3 in no acute distress Chest: CTA No chest wall pain w good excursion CV:  Pulses intact.  Regular rhythm MS: Normal AROM mjr joints.  No obvious deformity Abdomen: Soft.  Nondistended.  Nontender.  Drain with serosanguinous output. Ext:  SCDs BLE.  No mjr edema.  No cyanosis Skin: No petechiae / purpura   Problem List:   Active Problems:   Pericolonic abscess due to diverticulitis   Results:   Labs: Results for orders placed during the hospital encounter of 10/01/13 (from the past 48 hour(s))  BASIC METABOLIC PANEL     Status: Abnormal   Collection Time    10/05/13  5:19 AM      Result Value Range   Sodium 135  135 - 145 mEq/L   Potassium 3.6  3.5 - 5.1 mEq/L   Chloride 101  96 - 112 mEq/L   CO2 24  19 - 32 mEq/L   Glucose, Bld 105 (*) 70 - 99 mg/dL   BUN <3 (*) 6 - 23 mg/dL   Creatinine, Ser 7.82  0.50 - 1.10 mg/dL   Calcium 7.9 (*) 8.4 - 10.5 mg/dL   GFR calc non Af Amer 89 (*) >90 mL/min   GFR calc Af Amer >90  >90 mL/min   Comment: (NOTE)     The eGFR has been calculated using the CKD EPI equation.     This calculation has not been validated in all clinical situations.     eGFR's  persistently <90 mL/min signify possible Chronic Kidney     Disease.  CBC     Status: Abnormal   Collection Time    10/05/13  5:19 AM      Result Value Range   WBC 11.9 (*) 4.0 - 10.5 K/uL   RBC 3.42 (*) 3.87 - 5.11 MIL/uL   Hemoglobin 11.3 (*) 12.0 - 15.0 g/dL   HCT 95.6 (*) 21.3 - 08.6 %   MCV 97.7  78.0 - 100.0 fL   MCH 33.0  26.0 - 34.0 pg   MCHC 33.8  30.0 - 36.0 g/dL   RDW 57.8  46.9 - 62.9 %   Platelets 260  150 - 400 K/uL  HEPATIC FUNCTION PANEL     Status: Abnormal   Collection Time    10/05/13  5:19 AM      Result Value Range   Total Protein 4.8 (*) 6.0 - 8.3 g/dL   Albumin 1.9 (*) 3.5 - 5.2 g/dL   AST 528 (*) 0 - 37 U/L   ALT 103 (*) 0 -  35 U/L   Alkaline Phosphatase 389 (*) 39 - 117 U/L   Total Bilirubin 0.8  0.3 - 1.2 mg/dL   Bilirubin, Direct 0.4 (*) 0.0 - 0.3 mg/dL   Indirect Bilirubin 0.4  0.3 - 0.9 mg/dL  BASIC METABOLIC PANEL     Status: Abnormal   Collection Time    10/06/13  4:28 AM      Result Value Range   Sodium 136  135 - 145 mEq/L   Potassium 3.8  3.5 - 5.1 mEq/L   Chloride 99  96 - 112 mEq/L   CO2 27  19 - 32 mEq/L   Glucose, Bld 95  70 - 99 mg/dL   BUN <3 (*) 6 - 23 mg/dL   Creatinine, Ser 0.27  0.50 - 1.10 mg/dL   Calcium 8.1 (*) 8.4 - 10.5 mg/dL   GFR calc non Af Amer >90  >90 mL/min   GFR calc Af Amer >90  >90 mL/min   Comment: (NOTE)     The eGFR has been calculated using the CKD EPI equation.     This calculation has not been validated in all clinical situations.     eGFR's persistently <90 mL/min signify possible Chronic Kidney     Disease.  CBC     Status: Abnormal   Collection Time    10/06/13  4:28 AM      Result Value Range   WBC 9.5  4.0 - 10.5 K/uL   RBC 3.46 (*) 3.87 - 5.11 MIL/uL   Hemoglobin 11.5 (*) 12.0 - 15.0 g/dL   HCT 25.3 (*) 66.4 - 40.3 %   MCV 98.3  78.0 - 100.0 fL   MCH 33.2  26.0 - 34.0 pg   MCHC 33.8  30.0 - 36.0 g/dL   RDW 47.4  25.9 - 56.3 %   Platelets 281  150 - 400 K/uL  HEPATIC FUNCTION PANEL      Status: Abnormal   Collection Time    10/06/13  4:28 AM      Result Value Range   Total Protein 5.0 (*) 6.0 - 8.3 g/dL   Albumin 1.9 (*) 3.5 - 5.2 g/dL   AST 875 (*) 0 - 37 U/L   ALT 155 (*) 0 - 35 U/L   Alkaline Phosphatase 424 (*) 39 - 117 U/L   Total Bilirubin 0.5  0.3 - 1.2 mg/dL   Bilirubin, Direct 0.2  0.0 - 0.3 mg/dL   Indirect Bilirubin 0.3  0.3 - 0.9 mg/dL    Medications / Allergies: per chart  Antibiotics: Anti-infectives   Start     Dose/Rate Route Frequency Ordered Stop   10/01/13 1900  piperacillin-tazobactam (ZOSYN) IVPB 3.375 g     3.375 g 12.5 mL/hr over 240 Minutes Intravenous Every 8 hours 10/01/13 1755        Assessment/Plan Diverticulitis with pelvic abscess s/p perc drain -abdominal pain has resolved, WBC normal, tolerating a low fiber diet.   -74ml/24h output from drain -repeat CT in AM to determine if the drain can be removed -continue with zosyn -will need outpatient colonoscopy in 6 weeks  Chronic cholelithiasis versus gallbladder cancer -outpatient follow up   Ashok Norris, [facility] Pager [phone] Office [phone]  10/06/2013 8:47 AM

## 2013-10-06 NOTE — Progress Notes (Signed)
Patient interviewed and examined, agree with PA note above.  [name] T Latiesha Harada MD, FACS  10/06/2013 5:46 PM  

## 2013-10-06 NOTE — Progress Notes (Signed)
Occupational Therapy Treatment Patient Details Name: [name] MRN: [id number] DOB: 10-Oct-1935 Today's Date: 10/06/2013 Time: 9562-1308 OT Time Calculation (min): 32 min  OT Assessment / Plan / Recommendation  History of present illness Pt admitted with abdominal pain due to pelvic abscess due to diverticulitis, chronic cholecystitis.   OT comments  Pt progressing towards goals. Performed toileting, grooming at sink, and simulated shower transfer as well as LB dressing.    Follow Up Recommendations  No OT follow up;Supervision/Assistance - 24 hour    Barriers to Discharge       Equipment Recommendations  3 in 1 bedside comode    Recommendations for Other Services    Frequency Min 2X/week   Progress towards OT Goals Progress towards OT goals: Progressing toward goals  Plan Discharge plan remains appropriate    Precautions / Restrictions Precautions Precautions: Fall Restrictions Weight Bearing Restrictions: No   Pertinent Vitals/Pain Pain in buttocks area. Nurse notified.     ADL  Grooming: Performed;Wash/dry hands;Wash/dry face;Teeth care;Applying makeup;Brushing hair;Supervision/safety;Set up Where Assessed - Grooming: Supported standing;Unsupported standing Lower Body Dressing: Min guard Where Assessed - Lower Body Dressing: Unsupported sit to stand Toilet Transfer: Min Agricultural engineer Method: Sit to Barista: Comfort height toilet;Grab bars Toileting - Architect and Hygiene: Supervision/safety Where Assessed - Engineer, mining and Hygiene: Sit on 3-in-1 or toilet;Standing (hygiene-sitting and clothing-standing) Tub/Shower Transfer: Simulated;Min guard Tub/Shower Transfer Method: Science writer: Other (comment);Shower seat with back (practiced stepping over) Equipment Used: Gait belt Transfers/Ambulation Related to ADLs: Min guard ADL Comments: Pt practiced  simulated tub transfer at Min guard level and cues for technique. Educated pt to have spouse pick up rugs in house and/or to get non-skid rugs (could use non skid shelf liner and see if that works under rugs, if not, buy non skid rugs). Recommended sitting on shower chair to bathe. OT also spoke with pt about bringing foot up for LB ADLs. Recommended husband be with her for tub transfer.     OT Diagnosis:    OT Problem List:   OT Treatment Interventions:     OT Goals(current goals can now be found in the care plan section) Acute Rehab OT Goals Patient Stated Goal: not stated OT Goal Formulation: With patient Time For Goal Achievement: 10/18/13 Potential to Achieve Goals: Good ADL Goals Pt Will Perform Grooming: with modified independence;standing Pt Will Perform Lower Body Bathing: with modified independence;sit to/from stand Pt Will Perform Lower Body Dressing: with modified independence;sit to/from stand Pt Will Transfer to Toilet: with modified independence;ambulating Pt Will Perform Toileting - Clothing Manipulation and hygiene: with modified independence;sit to/from stand Pt Will Perform Tub/Shower Transfer: Shower transfer;with supervision;ambulating  Visit Information  Last OT Received On: 10/06/13 Assistance Needed: +1 History of Present Illness: Pt admitted with abdominal pain due to pelvic abscess due to diverticulitis, chronic cholecystitis.    Subjective Data      Prior Functioning       Cognition  Cognition Arousal/Alertness: Awake/alert Behavior During Therapy: WFL for tasks assessed/performed Overall Cognitive Status: Within Functional Limits for tasks assessed    Mobility  Bed Mobility Bed Mobility: Supine to Sit;Sit to Supine Supine to Sit: 5: Supervision Sit to Supine: 5: Supervision Details for Bed Mobility Assistance: supervision for safety.  Transfers Transfers: Sit to Stand;Stand to Sit Sit to Stand: 4: Min guard;5: Supervision;From chair/3-in-1;From  toilet;From bed Stand to Sit: 4: Min guard;5: Supervision;To bed;To chair/3-in-1;To toilet Details for Transfer Assistance: Min guard/supervision level  for transfers. Cues for technique.    Exercises      Balance     End of Session OT - End of Session Equipment Utilized During Treatment: Gait belt Activity Tolerance: Patient tolerated treatment well Patient left: in bed;with call bell/phone within reach Nurse Communication: Other (comment) (pt in bed and pain in buttocks area)  GO     Earlie Raveling OTR/L [phone] 10/06/2013, 5:14 PM

## 2013-10-06 NOTE — Progress Notes (Signed)
Subjective: Pt feeling ok. Moving bowels. Tol diet advanced diet. Reports mild soreness at drain site.  Objective: Physical Exam: BP 110/56  Pulse 73  Temp(Src) 97.9 F (36.6 C) (Oral)  Resp 20  Ht 5' 2.5" (1.588 m)  Wt 103 lb (46.72 kg)  BMI 18.53 kg/m2  SpO2 96% Rt transgluteal drain intact, site clean. Serosanguinous output, 30cc recorded yesterday.   Labs: CBC  Recent Labs  10/05/13 0519 10/06/13 0428  WBC 11.9* 9.5  HGB 11.3* 11.5*  HCT 33.4* 34.0*  PLT 260 281   BMET  Recent Labs  10/05/13 0519 10/06/13 0428  NA 135 136  K 3.6 3.8  CL 101 99  CO2 24 27  GLUCOSE 105* 95  BUN <3* <3*  CREATININE 0.54 0.52  CALCIUM 7.9* 8.1*   LFT  Recent Labs  10/06/13 0428  PROT 5.0*  ALBUMIN 1.9*  AST 121*  ALT 155*  ALKPHOS 424*  BILITOT 0.5  BILIDIR 0.2  IBILI 0.3   PT/INR No results found for this basename: LABPROT, INR,  in the last 72 hours   Studies/Results: No results found.  Assessment/Plan: S/p perc pelvic abscess drain from suspected diverticulitis Cx= E. Coli CT ordered for tomorrow, poss drain pull after, d/w CCS     LOS: 5 days    Brayton El PA-C 10/06/2013 9:09 AM

## 2013-10-07 ENCOUNTER — Inpatient Hospital Stay (HOSPITAL_COMMUNITY): Payer: BC Managed Care – PPO

## 2013-10-07 ENCOUNTER — Encounter (HOSPITAL_COMMUNITY): Payer: Self-pay | Admitting: *Deleted

## 2013-10-07 LAB — BASIC METABOLIC PANEL
BUN: <3 mg/dL — ABNORMAL LOW (ref 6–23)
CO2: 25 mEq/L (ref 19–32)
Calcium: 8.2 mg/dL — ABNORMAL LOW (ref 8.4–10.5)
Chloride: 96 mEq/L (ref 96–112)
Creatinine, Ser: 0.45 mg/dL — ABNORMAL LOW (ref 0.50–1.10)
GFR calc Af Amer: >90 mL/min (ref >90)
GFR calc non Af Amer: >90 mL/min (ref >90)
Glucose, Bld: 121 mg/dL — ABNORMAL HIGH (ref 70–99)
Potassium: 4.1 mEq/L (ref 3.5–5.1)
Sodium: 130 mEq/L — ABNORMAL LOW (ref 135–145)

## 2013-10-07 LAB — ANAEROBIC CULTURE

## 2013-10-07 LAB — CBC
HCT: 32.6 % — ABNORMAL LOW (ref 36.0–46.0)
Hemoglobin: 10.9 g/dL — ABNORMAL LOW (ref 12.0–15.0)
MCH: 33.1 pg (ref 26.0–34.0)
MCHC: 33.4 g/dL (ref 30.0–36.0)
MCV: 99.1 fL (ref 78.0–100.0)
Platelets: 323 10*3/uL (ref 150–400)
RBC: 3.29 MIL/uL — ABNORMAL LOW (ref 3.87–5.11)
RDW: 13.6 % (ref 11.5–15.5)
WBC: 12.9 10*3/uL — ABNORMAL HIGH (ref 4.0–10.5)

## 2013-10-07 LAB — HEPATIC FUNCTION PANEL
ALT: 90 U/L — ABNORMAL HIGH (ref 0–35)
AST: 33 U/L (ref 0–37)
Albumin: 1.8 g/dL — ABNORMAL LOW (ref 3.5–5.2)
Alkaline Phosphatase: 301 U/L — ABNORMAL HIGH (ref 39–117)
Bilirubin, Direct: 0.1 mg/dL (ref 0.0–0.3)
Indirect Bilirubin: 0.2 mg/dL — ABNORMAL LOW (ref 0.3–0.9)
Total Bilirubin: 0.3 mg/dL (ref 0.3–1.2)
Total Protein: 5.1 g/dL — ABNORMAL LOW (ref 6.0–8.3)

## 2013-10-07 MED ORDER — IOHEXOL 300 MG/ML  SOLN
25.0000 mL | INTRAMUSCULAR | Status: AC
  Administered 2013-10-07 (×2): 25 mL via ORAL

## 2013-10-07 MED ORDER — IOHEXOL 300 MG/ML  SOLN
50.0000 mL | Freq: Once | INTRAMUSCULAR | Status: AC | PRN
  Administered 2013-10-07: 1 mL

## 2013-10-07 MED ORDER — AMOXICILLIN-POT CLAVULANATE 875-125 MG PO TABS
1.0000 | ORAL_TABLET | Freq: Two times a day (BID) | ORAL | Status: DC
  Administered 2013-10-07 (×2): 1 via ORAL
  Filled 2013-10-07 (×4): qty 1

## 2013-10-07 MED ORDER — OXYCODONE HCL 5 MG PO TABS
5.0000 mg | ORAL_TABLET | ORAL | Status: DC | PRN
  Administered 2013-10-07 – 2013-10-09 (×5): 10 mg via ORAL
  Administered 2013-10-09: 5 mg via ORAL
  Administered 2013-10-10 – 2013-10-11 (×4): 10 mg via ORAL
  Filled 2013-10-07 (×4): qty 2
  Filled 2013-10-07: qty 1
  Filled 2013-10-07 (×5): qty 2

## 2013-10-07 MED ORDER — IOHEXOL 300 MG/ML  SOLN
80.0000 mL | Freq: Once | INTRAMUSCULAR | Status: AC | PRN
  Administered 2013-10-07: 80 mL via INTRAVENOUS

## 2013-10-07 NOTE — Progress Notes (Signed)
Physical Therapy Treatment Patient Details Name: [name] MRN: [id number] DOB: 08-02-1935 Today's Date: 10/07/2013 Time: 4098-1191 PT Time Calculation (min): 12 min  PT Assessment / Plan / Recommendation  History of Present Illness Pt admitted with abdominal pain due to pelvic abscess due to diverticulitis, chronic cholecystitis.   PT Comments   Pt indicates increased fatigue today, but agreeable to mobility.  Mildly unsteady today.  Will continue to follow.    Follow Up Recommendations  Home health PT;Supervision/Assistance - 24 hour     Does the patient have the potential to tolerate intense rehabilitation     Barriers to Discharge        Equipment Recommendations  None recommended by PT    Recommendations for Other Services    Frequency Min 3X/week   Progress towards PT Goals Progress towards PT goals: Progressing toward goals  Plan Current plan remains appropriate    Precautions / Restrictions Precautions Precautions: Fall Restrictions Weight Bearing Restrictions: No   Pertinent Vitals/Pain Denied pain.      Mobility  Bed Mobility Bed Mobility: Supine to Sit;Sitting - Scoot to Edge of Bed Supine to Sit: 5: Supervision Sitting - Scoot to Edge of Bed: 5: Supervision Transfers Transfers: Sit to Stand;Stand to Sit Sit to Stand: 4: Min guard;With upper extremity assist;From bed Stand to Sit: 4: Min guard;With upper extremity assist;To bed Details for Transfer Assistance: pt had mild LOB initally after coming to standing and had to sit back on bed.   Ambulation/Gait Ambulation/Gait Assistance: 4: Min guard Ambulation Distance (Feet): 120 Feet Assistive device: None Ambulation/Gait Assistance Details: pt moves slowly and indicates not feeling as well today.   Gait Pattern: Step-through pattern;Decreased stride length Stairs: Yes Stairs Assistance: 4: Min assist Stairs Assistance Details (indicate cue type and reason): pt moves slowly and mildly unsteady.   cues for safety.   Stair Management Technique: One rail Right;Forwards Number of Stairs: 3 Wheelchair Mobility Wheelchair Mobility: No    Exercises     PT Diagnosis:    PT Problem List:   PT Treatment Interventions:     PT Goals (current goals can now be found in the care plan section) Acute Rehab PT Goals Patient Stated Goal: not stated PT Goal Formulation: With patient Time For Goal Achievement: 10/10/13 Potential to Achieve Goals: Good  Visit Information  Last PT Received On: 10/07/13 Assistance Needed: +1 History of Present Illness: Pt admitted with abdominal pain due to pelvic abscess due to diverticulitis, chronic cholecystitis.    Subjective Data  Patient Stated Goal: not stated   Cognition  Cognition Arousal/Alertness: Awake/alert Behavior During Therapy: WFL for tasks assessed/performed Overall Cognitive Status: Within Functional Limits for tasks assessed    Balance  Balance Balance Assessed: No  End of Session PT - End of Session Equipment Utilized During Treatment: Gait belt Activity Tolerance: Patient limited by fatigue Patient left: in bed;with call bell/phone within reach;with nursing/sitter in room Nurse Communication: Mobility status   GP     [name],  [phone] 10/07/2013, 12:50 PM

## 2013-10-07 NOTE — Progress Notes (Signed)
Patient ID: [name], female   DOB: 1935/02/03, 78 y.o.   MRN: [id number]    Subjective: Pt feels well today.  Doesn't complain of any abdominal pain.  Tolerating a diet, just doesn't like it so she isn't eating much  Objective: Vital signs in last 24 hours: Temp:  [98 F (36.7 C)-98.8 F (37.1 C)] 98.1 F (36.7 C) (10/23 0823) Pulse Rate:  [80-86] 86 (10/23 0823) Resp:  [20] 20 (10/23 0823) BP: (76-113)/(50-60) 76/51 mmHg (10/23 0823) SpO2:  [94 %-96 %] 96 % (10/23 0823) Last BM Date: 10/07/13  Intake/Output from previous day: 10/22 0701 - 10/23 0700 In: 1145 [P.O.:480; I.V.:500; IV Piggyback:150] Out: 725 [Urine:700; Drains:25] Intake/Output this shift:    PE: Abd: soft, NT, ND, +BS, JP with serosang drainage Heart: regular Lungs: CTAB  Lab Results:   Recent Labs  10/06/13 0428 10/07/13 0437  WBC 9.5 12.9*  HGB 11.5* 10.9*  HCT 34.0* 32.6*  PLT 281 323   BMET  Recent Labs  10/06/13 0428 10/07/13 0437  NA 136 130*  K 3.8 4.1  CL 99 96  CO2 27 25  GLUCOSE 95 121*  BUN <3* <3*  CREATININE 0.52 0.45*  CALCIUM 8.1* 8.2*   PT/INR No results found for this basename: LABPROT, INR,  in the last 72 hours CMP     Component Value Date/Time   NA 130* 10/07/2013 0437   K 4.1 10/07/2013 0437   CL 96 10/07/2013 0437   CO2 25 10/07/2013 0437   GLUCOSE 121* 10/07/2013 0437   BUN <3* 10/07/2013 0437   CREATININE 0.45* 10/07/2013 0437   CALCIUM 8.2* 10/07/2013 0437   PROT 5.1* 10/07/2013 0437   ALBUMIN 1.8* 10/07/2013 0437   AST 33 10/07/2013 0437   ALT 90* 10/07/2013 0437   ALKPHOS 301* 10/07/2013 0437   BILITOT 0.3 10/07/2013 0437   GFRNONAA >90 10/07/2013 0437   GFRAA >90 10/07/2013 0437   Lipase  No results found for this basename: lipase       Studies/Results: No results found.  Anti-infectives: Anti-infectives   Start     Dose/Rate Route Frequency Ordered Stop   10/01/13 1900  piperacillin-tazobactam (ZOSYN) IVPB 3.375 g     3.375  g 12.5 mL/hr over 240 Minutes Intravenous Every 8 hours 10/01/13 1755         Assessment/Plan  1.  Presumed diverticulitis with fluid collection, s/p perc drain 2. Chronic cholecystitis  Plan: 1. Repeat CT scan today to evaluate fluid collection 2. BP a little low and not taking in a lot orally, will add NS at 50cc for now 3. Convert to oral pain meds. 4. Encourage po intake 5. Can switch to oral abx therapy if scan is improved  LOS: 6 days    Jeoffrey Eleazer E 10/07/2013, 8:44 AM Pager: [phone]

## 2013-10-07 NOTE — Progress Notes (Signed)
Patient interviewed and examined, agree with PA note above.  Mariella Saa MD, FACS  10/07/2013 10:11 AM

## 2013-10-07 NOTE — Progress Notes (Signed)
ANTIBIOTIC CONSULT NOTE - FOLLOW UP  Pharmacy Consult for Zosyn Indication: Pelvic abscess  Allergies  Allergen Reactions  . Cymbalta [Duloxetine Hcl] Other (See Comments)    "kinda went nuts"  . Halcion [Triazolam] Other (See Comments)    "kinda went nuts"    Patient Measurements: Height: 5' 2.5" (158.8 cm) Weight: 103 lb (46.72 kg) IBW/kg (Calculated) : 51.25 Adjusted Body Weight:    Vital Signs: Temp: 98.8 F (37.1 C) (10/23 0500) Temp src: Oral (10/23 0500) BP: 99/50 mmHg (10/23 0500) Pulse Rate: 80 (10/23 0500) Intake/Output from previous day: 10/22 0701 - 10/23 0700 In: 1145 [P.O.:480; I.V.:500; IV Piggyback:150] Out: 725 [Urine:700; Drains:25] Intake/Output from this shift:    Labs:  Recent Labs  10/05/13 0519 10/06/13 0428 10/07/13 0437  WBC 11.9* 9.5 12.9*  HGB 11.3* 11.5* 10.9*  PLT 260 281 323  CREATININE 0.54 0.52 0.45*   Estimated Creatinine Clearance: 44.1 ml/min (by C-G formula based on Cr of 0.45). No results found for this basename: VANCOTROUGH, Leodis Binet, VANCORANDOM, GENTTROUGH, GENTPEAK, GENTRANDOM, TOBRATROUGH, TOBRAPEAK, TOBRARND, AMIKACINPEAK, AMIKACINTROU, AMIKACIN,  in the last 72 hours   Microbiology: Recent Results (from the past 720 hour(s))  CULTURE, ROUTINE-ABSCESS     Status: None   Collection Time    10/02/13 11:43 AM      Result Value Range Status   Specimen Description ABSCESS   Final   Special Requests PELVIC CUL DE SAC   Final   Gram Stain     Final   Value: FEW WBC PRESENT, PREDOMINANTLY PMN     NO SQUAMOUS EPITHELIAL CELLS SEEN     FEW GRAM NEGATIVE RODS     Performed at [facility]   Culture     Final   Value: MODERATE ESCHERICHIA COLI     Performed at [facility]   Report Status 10/04/2013 FINAL   Final   Organism ID, Bacteria ESCHERICHIA COLI   Final  ANAEROBIC CULTURE     Status: None   Collection Time    10/02/13 11:43 AM      Result Value Range Status   Specimen Description ABSCESS    Final   Special Requests PELVIC CUL DE SAC   Final   Gram Stain PENDING   Incomplete   Culture     Final   Value: NO ANAEROBES ISOLATED; CULTURE IN PROGRESS FOR 5 DAYS     Performed at [facility]   Report Status PENDING   Incomplete    Anti-infectives   Start     Dose/Rate Route Frequency Ordered Stop   10/01/13 1900  piperacillin-tazobactam (ZOSYN) IVPB 3.375 g     3.375 g 12.5 mL/hr over 240 Minutes Intravenous Every 8 hours 10/01/13 1755        Assessment: Pain across abdomen with anorexia. Patient found to have diverticular pelvic acscess, abnormal gallbladder with possible liver involvement.  Anticoagulation - SCD's  Infectious Disease - IV Zosyn s/p perc pelvic abscess drain from suspected diverticulitis, Cx= E. Coli. Afebrile. WBC up to 12.9  10/17 Zosyn >> 10/18: Pelvic abscess: Ecoli S Zosyn   Goal of Therapy:  Treatment of Ecoli in pelvic abscess  Plan:  Zosyn 3.375g IV q8hr. Dose ok down to CrCl 20. Pharmacy will sign off. Please reconsult for further dosing assistance.   Nehemiah Montee S. Merilynn Finland, PharmD, [facility] Clinical Staff Pharmacist Pager [phone]  Misty Stanley Stillinger 10/07/2013,7:43 AM

## 2013-10-07 NOTE — Progress Notes (Signed)
Subjective: Awake aleert comfortable drinking contrast for later. Husband at bedside.  Objective: Vital signs in last 24 hours: Temp:  [98 F (36.7 C)-98.8 F (37.1 C)] 98.1 F (36.7 C) (10/23 0823) Pulse Rate:  [80-86] 86 (10/23 0823) Resp:  [20] 20 (10/23 0823) BP: (76-113)/(50-60) 112/57 mmHg (10/23 0845) SpO2:  [94 %-96 %] 96 % (10/23 0823) Weight change:   CBG (last 3)  No results found for this basename: GLUCAP,  in the last 72 hours  Intake/Output from previous day: 10/22 0701 - 10/23 0700 In: 1145 [P.O.:480; I.V.:500; IV Piggyback:150] Out: 725 [Urine:700; Drains:25]  Physical Exam:  Bright awake alert no distress smiling. Good facial symmetry. No JVD or bruits. Lungs are clear. Cardiovascular exam is regular no murmur. Abdomen is soft nontender good bowel sounds. Extremities no peripheral edema intact pulses. Neurologically she is nonlateralizing      Lab Results:  Recent Labs  10/06/13 0428 10/07/13 0437  NA 136 130*  K 3.8 4.1  CL 99 96  CO2 27 25  GLUCOSE 95 121*  BUN <3* <3*  CREATININE 0.52 0.45*  CALCIUM 8.1* 8.2*    Recent Labs  10/06/13 0428 10/07/13 0437  AST 121* 33  ALT 155* 90*  ALKPHOS 424* 301*  BILITOT 0.5 0.3  PROT 5.0* 5.1*  ALBUMIN 1.9* 1.8*    Recent Labs  10/06/13 0428 10/07/13 0437  WBC 9.5 12.9*  HGB 11.5* 10.9*  HCT 34.0* 32.6*  MCV 98.3 99.1  PLT 281 323   No results found for this basename: INR, PROTIME   No results found for this basename: CKTOTAL, CKMB, CKMBINDEX, TROPONINI,  in the last 72 hours No results found for this basename: TSH, T4TOTAL, FREET3, T3FREE, THYROIDAB,  in the last 72 hours No results found for this basename: VITAMINB12, FOLATE, FERRITIN, TIBC, IRON, RETICCTPCT,  in the last 72 hours  Studies/Results: No results found.   Assessment/Plan: #1 pelvic abscess secondary to diverticulitis status post percutaneous drainage of white blood cells decreasing IV antibiotics continue . Await ct  today--bump in wbc concerning. Surgery guiding #2 gallbladder abnormality etiology unclear possible chronic cholecystitis to be resulted later date  #3 essential hypertension excellent control  #4 primary hypothyroidism replaced    LOS: 6 days   Prestin Munch A 10/07/2013, 10:22 AM

## 2013-10-08 LAB — BASIC METABOLIC PANEL
BUN: <3 mg/dL — ABNORMAL LOW (ref 6–23)
CO2: 24 mEq/L (ref 19–32)
Calcium: 9 mg/dL (ref 8.4–10.5)
Chloride: 99 mEq/L (ref 96–112)
Creatinine, Ser: 0.55 mg/dL (ref 0.50–1.10)
GFR calc Af Amer: >90 mL/min (ref >90)
GFR calc non Af Amer: 89 mL/min — ABNORMAL LOW (ref >90)
Glucose, Bld: 102 mg/dL — ABNORMAL HIGH (ref 70–99)
Potassium: 3.9 mEq/L (ref 3.5–5.1)
Sodium: 136 mEq/L (ref 135–145)

## 2013-10-08 LAB — HEPATIC FUNCTION PANEL
ALT: 70 U/L — ABNORMAL HIGH (ref 0–35)
AST: 21 U/L (ref 0–37)
Albumin: 2.2 g/dL — ABNORMAL LOW (ref 3.5–5.2)
Alkaline Phosphatase: 290 U/L — ABNORMAL HIGH (ref 39–117)
Bilirubin, Direct: 0.2 mg/dL (ref 0.0–0.3)
Indirect Bilirubin: 0.3 mg/dL (ref 0.3–0.9)
Total Bilirubin: 0.5 mg/dL (ref 0.3–1.2)
Total Protein: 6.1 g/dL (ref 6.0–8.3)

## 2013-10-08 LAB — CBC
HCT: 37.2 % (ref 36.0–46.0)
Hemoglobin: 12.3 g/dL (ref 12.0–15.0)
MCH: 32.8 pg (ref 26.0–34.0)
MCHC: 33.1 g/dL (ref 30.0–36.0)
MCV: 99.2 fL (ref 78.0–100.0)
Platelets: 392 10*3/uL (ref 150–400)
RBC: 3.75 MIL/uL — ABNORMAL LOW (ref 3.87–5.11)
RDW: 13.7 % (ref 11.5–15.5)
WBC: 14.2 10*3/uL — ABNORMAL HIGH (ref 4.0–10.5)

## 2013-10-08 LAB — CLOSTRIDIUM DIFFICILE BY PCR: Toxigenic C. Difficile by PCR: NEGATIVE

## 2013-10-08 MED ORDER — PROMETHAZINE HCL 25 MG/ML IJ SOLN: 12.5000 mg | Freq: Four times a day (QID) | INTRAMUSCULAR | Status: DC | PRN

## 2013-10-08 MED ORDER — CIPROFLOXACIN HCL 500 MG PO TABS
500.0000 mg | ORAL_TABLET | Freq: Two times a day (BID) | ORAL | Status: DC
  Administered 2013-10-08 – 2013-10-12 (×9): 500 mg via ORAL
  Filled 2013-10-08 (×13): qty 1

## 2013-10-08 MED ORDER — PROMETHAZINE HCL 25 MG/ML IJ SOLN
12.5000 mg | Freq: Once | INTRAMUSCULAR | Status: AC
  Administered 2013-10-08: 12.5 mg via INTRAVENOUS
  Filled 2013-10-08: qty 1

## 2013-10-08 NOTE — Progress Notes (Signed)
Subjective: nausea vomiting and diarrhea this AM.  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 4 stools recorded yesterday Afebrile, VSS,  WBC is 14.2 CMP ok albumin 2.2 Sinus fistulagram yesterday shows  No fistulous connection between drain and sigmoid colon.  Small residual collapsed cavity, drain removed. CT:1. Near complete resolution of pelvic abscess following percutaneous drainage catheter placement. 2. Ileus  3.chronic calculus cholecystitis , 4. Increased interval bilat pleural effusions.   Intake/Output from previous day: 10/23 0701 - 10/24 0700 In: 1798 [P.O.:282; I.V.:1366; IV Piggyback:150] Out: 720 [Urine:720] Intake/Output this shift:    General appearance: alert, cooperative and no distress GI: soft not really tender now.  Pain she had this AM is better. No distension.  Lab Results:   Recent Labs  10/07/13 0437 10/08/13 0500  WBC 12.9* 14.2*  HGB 10.9* 12.3  HCT 32.6* 37.2  PLT 323 392    BMET  Recent Labs  10/07/13 0437 10/08/13 0500  NA 130* 136  K 4.1 3.9  CL 96 99  CO2 25 24  GLUCOSE 121* 102*  BUN <3* <3*  CREATININE 0.45* 0.55  CALCIUM 8.2* 9.0   PT/INR No results found for this basename: LABPROT, INR,  in the last 72 hours   Recent Labs Lab 10/04/13 0630 10/05/13 0519 10/06/13 0428 10/07/13 0437 10/08/13 0500  AST 213* 201* 121* 33 21  ALT 106* 103* 155* 90* 70*  ALKPHOS 448* 389* 424* 301* 290*  BILITOT 0.6 0.8 0.5 0.3 0.5  PROT 5.2* 4.8* 5.0* 5.1* 6.1  ALBUMIN 1.9* 1.9* 1.9* 1.8* 2.2*     Lipase  No results found for this basename: lipase     Studies/Results: Ct Abdomen Pelvis W  Contrast  10/07/2013   CLINICAL DATA:  Diverticular abscess, followup  EXAM: CT ABDOMEN AND PELVIS WITH CONTRAST  TECHNIQUE: Multidetector CT imaging of the abdomen and pelvis was performed using the standard protocol following bolus administration of intravenous contrast.  CONTRAST:  80mL OMNIPAQUE IOHEXOL 300 MG/ML  SOLN  COMPARISON:  MRI 810 07/17/2013, CT 10/01/2013  FINDINGS: Marked reduction of the pelvic abscess following percutaneous drain placement. A pigtail catheter is in the deep right pelvis with no measurable fluid collection where previously there was a 6.7 by 5.1 cm fluid collection. Small collection more superiorly centrally within the pelvis is also resolved and barely measurable at 5 mm compared to 30 mm on prior. Rounded lesion measuring 14 mm (image 60) is unchanged may represent ovarian cyst.  There is interval increase in bilateral pleural effusions and left basilar atelectasis compared to prior.  Multiple low-density lesions within the liver likely represent benign cysts. Gallbladder is again demonstrated to have extensive bowel wall inflammation with fatty infiltration measuring up to 15 mm. There is a large stone occupying the entire lumen of the gallbladder. This chronic cholecystitis pattern was evaluated on recent MRI. There is mild intrahepatic biliary duct dilatation which is similar prior. The common bile duct is normal caliber. The pancreas, spleen, kidneys are normal. Nodule enlargement of the adrenal glands is stable.  The stomach, small bowel and colon are unchanged. There is mild dilatation of the small bowel similar 2  prior suggesting ileus. Bladder and uterus are normal. No aggressive osseous lesion  IMPRESSION: 1. Near complete resolution of pelvic abscess following percutaneous drainage catheter placement.  2. Mild small bowel ileus.  3.  Chronic calculus cholecystitis again demonstrated.  4. Interval increase in bilateral pleural effusions and basilar atelectasis.    Electronically Signed   By: Genevive Bi M.D.   On: 10/07/2013 12:38   Ir Sinus/fist Tube Chk-non Gi  10/07/2013   CLINICAL DATA:  78 year old female with a history of diverticular abscess status post placement of a CT-guided trans gluteal drain on 10/02/2013. She has subsequently recovered is now feeling well. A CT of the abdomen and pelvis were obtained earlier today demonstrates no residual fluid collection or abscess. She presents to Interventional Radiology for contrast injection under fluoroscopy to exclude a fistulous connection with the adjacent sigmoid colon.  EXAM: SINUS TRACT INJECTION/FISTULOGRAM  TECHNIQUE: Hand injection of contrast through the existing right trans gluteal pigtail drainage catheter with fluoroscopic imaging.  COMPARISON:  CT abdomen/pelvis obtained earlier today; prior CT abdomen/ pelvis 10/01/2013  FLUOROSCOPY TIME:  36 seconds  FINDINGS: A contrast injection demonstrates filling of a minimal collapsed residual abscess collection. There is no evidence of fistulous connection with the colon. The injected material was aspirated. The drain was severed and removed. A sterile bandage was placed. The patient tolerated the procedure well; there was no immediate complication.  IMPRESSION: No evidence of fistulous connection between the tube and of the sigmoid colon. There is a small residual collapsed cavity.  The drain was removed.  Signed,  Sterling Big, MD  [facility]  [facility]   Electronically Signed   By: Malachy Moan M.D.   On: 10/07/2013 15:25    Medications: . busPIRone  10 mg Oral BID  . ciprofloxacin  500 mg Oral BID  . feeding supplement (ENSURE COMPLETE)  237 mL Oral BID BM  . levothyroxine  75 mcg Oral QAC breakfast  . multivitamin with minerals  1 tablet Oral Daily  . potassium chloride  20 mEq Oral BID    Assessment/Plan 1. Presumed diverticulitis with fluid collection, s/p perc drain 10/02/13 in  IR 2. Chronic cholecystitis 3.  Diarrhea C diff pending 4. Hypotension   5.  Anxiety  6.  Anemia 7.  Malnutrition   Dr. 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  C diff, and labs ordered for tomorrow.        LOS: 7 days    [name] 10/08/2013

## 2013-10-08 NOTE — Progress Notes (Signed)
Patient interviewed and examined, agree with PA note above.  Mariella Saa MD, FACS  10/08/2013 6:24 PM

## 2013-10-08 NOTE — Progress Notes (Signed)
Subjective: More nausea this AM. Needed zofran then phenergan. Now feeling a bit better. No abd pain. 2 loose stools yesterday and one today. No fever or chills. Still not eating very well   Objective: Vital signs in last 24 hours: Temp:  [98.1 F (36.7 C)-99.2 F (37.3 C)] 99.2 F (37.3 C) (10/24 0522) Pulse Rate:  [76-86] 86 (10/24 0522) Resp:  [20] 20 (10/24 0522) BP: (76-118)/(51-67) 106/56 mmHg (10/24 0522) SpO2:  [93 %-97 %] 97 % (10/24 0522)  Intake/Output from previous day: 10/23 0701 - 10/24 0700 In: 1798 [P.O.:282; I.V.:1366; IV Piggyback:150] Out: 720 [Urine:720] Intake/Output this shift:    In no distress. Lying flat. Oral membranes moist. Lungs clear. abd no RUQ tenderness. BS's good. mentating well  Lab Results   Recent Labs  10/07/13 0437 10/08/13 0500  WBC 12.9* 14.2*  RBC 3.29* 3.75*  HGB 10.9* 12.3  HCT 32.6* 37.2  MCV 99.1 99.2  MCH 33.1 32.8  RDW 13.6 13.7  PLT 323 392    Recent Labs  10/07/13 0437 10/08/13 0500  NA 130* 136  K 4.1 3.9  CL 96 99  CO2 25 24  GLUCOSE 121* 102*  BUN <3* <3*  CREATININE 0.45* 0.55  CALCIUM 8.2* 9.0    Studies/Results: Ct Abdomen Pelvis W Contrast  10/07/2013   CLINICAL DATA:  Diverticular abscess, followup  EXAM: CT ABDOMEN AND PELVIS WITH CONTRAST  TECHNIQUE: Multidetector CT imaging of the abdomen and pelvis was performed using the standard protocol following bolus administration of intravenous contrast.  CONTRAST:  80mL OMNIPAQUE IOHEXOL 300 MG/ML  SOLN  COMPARISON:  MRI 810 07/17/2013, CT 10/01/2013  FINDINGS: Marked reduction of the pelvic abscess following percutaneous drain placement. A pigtail catheter is in the deep right pelvis with no measurable fluid collection where previously there was a 6.7 by 5.1 cm fluid collection. Small collection more superiorly centrally within the pelvis is also resolved and barely measurable at 5 mm compared to 30 mm on prior. Rounded lesion measuring 14 mm (image 60) is  unchanged may represent ovarian cyst.  There is interval increase in bilateral pleural effusions and left basilar atelectasis compared to prior.  Multiple low-density lesions within the liver likely represent benign cysts. Gallbladder is again demonstrated to have extensive bowel wall inflammation with fatty infiltration measuring up to 15 mm. There is a large stone occupying the entire lumen of the gallbladder. This chronic cholecystitis pattern was evaluated on recent MRI. There is mild intrahepatic biliary duct dilatation which is similar prior. The common bile duct is normal caliber. The pancreas, spleen, kidneys are normal. Nodule enlargement of the adrenal glands is stable.  The stomach, small bowel and colon are unchanged. There is mild dilatation of the small bowel similar 2 prior suggesting ileus. Bladder and uterus are normal. No aggressive osseous lesion  IMPRESSION: 1. Near complete resolution of pelvic abscess following percutaneous drainage catheter placement.  2. Mild small bowel ileus.  3.  Chronic calculus cholecystitis again demonstrated.  4. Interval increase in bilateral pleural effusions and basilar atelectasis.   Electronically Signed   By: Genevive Bi M.D.   On: 10/07/2013 12:38   Ir Sinus/fist Tube Chk-non Gi  10/07/2013   CLINICAL DATA:  78 year old female with a history of diverticular abscess status post placement of a CT-guided trans gluteal drain on 10/02/2013. She has subsequently recovered is now feeling well. A CT of the abdomen and pelvis were obtained earlier today demonstrates no residual fluid collection or abscess. She presents  to Interventional Radiology for contrast injection under fluoroscopy to exclude a fistulous connection with the adjacent sigmoid colon.  EXAM: SINUS TRACT INJECTION/FISTULOGRAM  TECHNIQUE: Hand injection of contrast through the existing right trans gluteal pigtail drainage catheter with fluoroscopic imaging.  COMPARISON:  CT abdomen/pelvis  obtained earlier today; prior CT abdomen/ pelvis 10/01/2013  FLUOROSCOPY TIME:  36 seconds  FINDINGS: A contrast injection demonstrates filling of a minimal collapsed residual abscess collection. There is no evidence of fistulous connection with the colon. The injected material was aspirated. The drain was severed and removed. A sterile bandage was placed. The patient tolerated the procedure well; there was no immediate complication.  IMPRESSION: No evidence of fistulous connection between the tube and of the sigmoid colon. There is a small residual collapsed cavity.  The drain was removed.  Signed,  Sterling Big, MD  [facility]  [facility]   Electronically Signed   By: Malachy Moan M.D.   On: 10/07/2013 15:25    Scheduled Meds: . amoxicillin-clavulanate  1 tablet Oral Q12H  . busPIRone  10 mg Oral BID  . feeding supplement (ENSURE COMPLETE)  237 mL Oral BID BM  . levothyroxine  75 mcg Oral QAC breakfast  . lidocaine (PF)  10 mL Other Once  . multivitamin with minerals  1 tablet Oral Daily  . potassium chloride  20 mEq Oral BID   Continuous Infusions: . sodium chloride 50 mL/hr at 10/08/13 0737   PRN Meds:diphenhydrAMINE, ondansetron, oxyCODONE, promethazine, temazepam, traMADol  Assessment/Plan: PELVIC ABSCESS/DIVERTICULITIS: CT much improved. On augmentin but not tolerating it. Change to Cipro PO. WBC up DIARRHEA: doubt Cdiff but it is possible. Send test. WBC is trending up CHOLECYSTITIS: Still present. ? Timing of surgery HYPOTENSION: hold cozaar, increase fluids HYPOTHYROID: on Rx ANXIETY:On Rx MALNUTRITION: We havent made much progress ANEMIA: improved  LOS: 7 days   [name] ALAN 10/08/2013, 8:12 AM

## 2013-10-09 DIAGNOSIS — E43 Unspecified severe protein-calorie malnutrition: Secondary | ICD-10-CM | POA: Diagnosis present

## 2013-10-09 DIAGNOSIS — F17200 Nicotine dependence, unspecified, uncomplicated: Secondary | ICD-10-CM | POA: Diagnosis present

## 2013-10-09 DIAGNOSIS — R197 Diarrhea, unspecified: Secondary | ICD-10-CM | POA: Diagnosis not present

## 2013-10-09 LAB — HEPATIC FUNCTION PANEL
ALT: 39 U/L — ABNORMAL HIGH (ref 0–35)
AST: 13 U/L (ref 0–37)
Albumin: 1.8 g/dL — ABNORMAL LOW (ref 3.5–5.2)
Alkaline Phosphatase: 197 U/L — ABNORMAL HIGH (ref 39–117)
Bilirubin, Direct: 0.1 mg/dL (ref 0.0–0.3)
Indirect Bilirubin: 0.3 mg/dL (ref 0.3–0.9)
Total Bilirubin: 0.4 mg/dL (ref 0.3–1.2)
Total Protein: 5.1 g/dL — ABNORMAL LOW (ref 6.0–8.3)

## 2013-10-09 LAB — CBC
HCT: 32.4 % — ABNORMAL LOW (ref 36.0–46.0)
Hemoglobin: 10.5 g/dL — ABNORMAL LOW (ref 12.0–15.0)
MCH: 32.6 pg (ref 26.0–34.0)
MCHC: 32.4 g/dL (ref 30.0–36.0)
MCV: 100.6 fL — ABNORMAL HIGH (ref 78.0–100.0)
Platelets: 352 10*3/uL (ref 150–400)
RBC: 3.22 MIL/uL — ABNORMAL LOW (ref 3.87–5.11)
RDW: 13.9 % (ref 11.5–15.5)
WBC: 12.5 10*3/uL — ABNORMAL HIGH (ref 4.0–10.5)

## 2013-10-09 LAB — BASIC METABOLIC PANEL
BUN: <3 mg/dL — ABNORMAL LOW (ref 6–23)
CO2: 20 mEq/L (ref 19–32)
Calcium: 8.2 mg/dL — ABNORMAL LOW (ref 8.4–10.5)
Chloride: 104 mEq/L (ref 96–112)
Creatinine, Ser: 0.52 mg/dL (ref 0.50–1.10)
GFR calc Af Amer: >90 mL/min (ref >90)
GFR calc non Af Amer: >90 mL/min (ref >90)
Glucose, Bld: 96 mg/dL (ref 70–99)
Potassium: 4 mEq/L (ref 3.5–5.1)
Sodium: 136 mEq/L (ref 135–145)

## 2013-10-09 MED ORDER — POTASSIUM CHLORIDE IN NACL 20-0.9 MEQ/L-% IV SOLN
INTRAVENOUS | Status: DC
  Administered 2013-10-09 – 2013-10-12 (×4): via INTRAVENOUS
  Filled 2013-10-09 (×10): qty 1000

## 2013-10-09 MED ORDER — ADULT MULTIVITAMIN W/MINERALS CH: 1.0000 | ORAL_TABLET | Freq: Every day | ORAL | Status: DC

## 2013-10-09 MED ORDER — ENSURE COMPLETE PO LIQD: 237.0000 mL | Freq: Two times a day (BID) | ORAL | Status: DC

## 2013-10-09 MED ORDER — CIPROFLOXACIN HCL 500 MG PO TABS: 500.0000 mg | ORAL_TABLET | Freq: Two times a day (BID) | ORAL | Status: DC

## 2013-10-09 NOTE — Progress Notes (Signed)
General Surgery Note  LOS: 8 days  POD -     Assessment/Plan: 1.  Diverticulitis with fluid collection  Perc drain - 11/02/2013 - removed 11/223/2014  Cultures -  E. Coli, On Cipro  She looks good from our standpoint.  Will need follow up with Dr. Johna Sheriff in 3 ot 4 weeks.  2. Chronic cholecystitis  3. Possible Diarrhea C diff - neg 10/08/2013 4. Hypotension  5. Anxiety  6. Anemia  7. Malnutrition  Subjective:  Doing well.  Tolerating reg diet.  No abdominal pain (though she said that she did not have much pain when this began). Objective:   Filed Vitals:   10/09/13 0545  BP: 114/52  Pulse: 78  Temp: 98 F (36.7 C)  Resp: 18     Intake/Output from previous day:  10/24 0701 - 10/25 0700 In: 3080 [P.O.:360; I.V.:2720] Out: 450 [Urine:450]  Intake/Output this shift:      Physical Exam:   General: Thin older WF who is alert and oriented.    HEENT: Normal. Pupils equal. .   Lungs: Clear   Abdomen: Soft.  BS present.   Lab Results:    Recent Labs  10/08/13 0500 10/09/13 0409  WBC 14.2* 12.5*  HGB 12.3 10.5*  HCT 37.2 32.4*  PLT 392 352    BMET   Recent Labs  10/08/13 0500 10/09/13 0409  NA 136 136  K 3.9 4.0  CL 99 104  CO2 24 20  GLUCOSE 102* 96  BUN <3* <3*  CREATININE 0.55 0.52  CALCIUM 9.0 8.2*    PT/INR  No results found for this basename: LABPROT, INR,  in the last 72 hours  ABG  No results found for this basename: PHART, PCO2, PO2, HCO3,  in the last 72 hours   Studies/Results:  Ct Abdomen Pelvis W Contrast  10/07/2013   CLINICAL DATA:  Diverticular abscess, followup  EXAM: CT ABDOMEN AND PELVIS WITH CONTRAST  TECHNIQUE: Multidetector CT imaging of the abdomen and pelvis was performed using the standard protocol following bolus administration of intravenous contrast.  CONTRAST:  80mL OMNIPAQUE IOHEXOL 300 MG/ML  SOLN  COMPARISON:  MRI 810 07/17/2013, CT 10/01/2013  FINDINGS: Marked reduction of the pelvic abscess following percutaneous  drain placement. A pigtail catheter is in the deep right pelvis with no measurable fluid collection where previously there was a 6.7 by 5.1 cm fluid collection. Small collection more superiorly centrally within the pelvis is also resolved and barely measurable at 5 mm compared to 30 mm on prior. Rounded lesion measuring 14 mm (image 60) is unchanged may represent ovarian cyst.  There is interval increase in bilateral pleural effusions and left basilar atelectasis compared to prior.  Multiple low-density lesions within the liver likely represent benign cysts. Gallbladder is again demonstrated to have extensive bowel wall inflammation with fatty infiltration measuring up to 15 mm. There is a large stone occupying the entire lumen of the gallbladder. This chronic cholecystitis pattern was evaluated on recent MRI. There is mild intrahepatic biliary duct dilatation which is similar prior. The common bile duct is normal caliber. The pancreas, spleen, kidneys are normal. Nodule enlargement of the adrenal glands is stable.  The stomach, small bowel and colon are unchanged. There is mild dilatation of the small bowel similar 2 prior suggesting ileus. Bladder and uterus are normal. No aggressive osseous lesion  IMPRESSION: 1. Near complete resolution of pelvic abscess following percutaneous drainage catheter placement.  2. Mild small bowel ileus.  3.  Chronic  calculus cholecystitis again demonstrated.  4. Interval increase in bilateral pleural effusions and basilar atelectasis.   Electronically Signed   By: Genevive Bi M.D.   On: 10/07/2013 12:38   Ir Sinus/fist Tube Chk-non Gi  10/07/2013   CLINICAL DATA:  78 year old female with a history of diverticular abscess status post placement of a CT-guided trans gluteal drain on 10/02/2013. She has subsequently recovered is now feeling well. A CT of the abdomen and pelvis were obtained earlier today demonstrates no residual fluid collection or abscess. She presents to  Interventional Radiology for contrast injection under fluoroscopy to exclude a fistulous connection with the adjacent sigmoid colon.  EXAM: SINUS TRACT INJECTION/FISTULOGRAM  TECHNIQUE: Hand injection of contrast through the existing right trans gluteal pigtail drainage catheter with fluoroscopic imaging.  COMPARISON:  CT abdomen/pelvis obtained earlier today; prior CT abdomen/ pelvis 10/01/2013  FLUOROSCOPY TIME:  36 seconds  FINDINGS: A contrast injection demonstrates filling of a minimal collapsed residual abscess collection. There is no evidence of fistulous connection with the colon. The injected material was aspirated. The drain was severed and removed. A sterile bandage was placed. The patient tolerated the procedure well; there was no immediate complication.  IMPRESSION: No evidence of fistulous connection between the tube and of the sigmoid colon. There is a small residual collapsed cavity.  The drain was removed.  Signed,  Sterling Big, MD  [facility]  [facility]   Electronically Signed   By: Malachy Moan M.D.   On: 10/07/2013 15:25     Anti-infectives:   Anti-infectives   Start     Dose/Rate Route Frequency Ordered Stop   10/08/13 0830  ciprofloxacin (CIPRO) tablet 500 mg     500 mg Oral 2 times daily 10/08/13 0818     10/07/13 1500  amoxicillin-clavulanate (AUGMENTIN) 875-125 MG per tablet 1 tablet  Status:  Discontinued     1 tablet Oral Every 12 hours 10/07/13 1403 10/08/13 0818   10/01/13 1900  piperacillin-tazobactam (ZOSYN) IVPB 3.375 g  Status:  Discontinued     3.375 g 12.5 mL/hr over 240 Minutes Intravenous Every 8 hours 10/01/13 1755 10/07/13 1403      Ovidio Kin, MD, FACS Pager: [phone] [facility] Office: [phone] 10/09/2013

## 2013-10-09 NOTE — Progress Notes (Signed)
Husband came out of room and stated that his wife was smoking.  We went into the room and could smell smoke and told the pt she could not smoke in the room.  She stated she had one puff and that was it.  Offered to call the MD and get an order for a nicotene patch but pt declined.

## 2013-10-09 NOTE — Progress Notes (Signed)
Discharge instructions explained to pt. And pt. Expressed understanding.  Changed pt.'s right buttocks dressing-no signs of infection or drainage.  Prescriptions given to pt. Discharged to home with husband. Vanice Sarah

## 2013-10-09 NOTE — Progress Notes (Signed)
Pt. Was given discharge instructions, however, new orders were placed to remain on [address] overnight for IV fluids.  I called Dr. Jarold Motto to confirm the change in discharge orders and notified the patient and her husband.  The husband also called Dr. Jarold Motto and agreed to keep his wife here one more night.  Will continue to monitor. Vanice Sarah

## 2013-10-09 NOTE — Discharge Summary (Signed)
Physician Discharge Summary  Patient ID: [name] MRN: [id number] DOB/AGE: 76-Mar-1936 76 y.o.  Admit date: 10/01/2013 Discharge date: 10/09/2013   Discharge Diagnoses:  Principal Problem:   Pericolonic abscess due to diverticulitis Active Problems:   Diverticular disease small and large intestine, perforation, abscess   Calculus of gallbladder with acute cholecystitis, without mention of obstruction   Severe protein-calorie malnutrition   Loss of weight   Diarrhea   Tobacco use disorder   Discharged Condition: fair  Hospital Course:   The patient is a 78 year old Caucasian woman with several medical problems who had had problems with nausea, anorexia, and significant weight loss leading to a 10/01/2013 CT scan of the abdomen and pelvis. The CT scan showed findings or worrisome for possible gallbladder carcinoma as well as multiple pelvic abscesses. She was admitted to the hospital for further evaluation and IV antibiotics. She was seen by consultants from general surgery and from interventional radiology throughout her hospital course. She had an MRCP of the abdomen for evaluation of the gallbladder with findings most suggestive of chronic cholecystitis, rather than cancer. The interventional radiologist placed a CT-guided drainage catheter into the pelvic abscesses and removed about 80 mL of purulent fluid with cultures showing Escherichia coli. Gradually, her condition improved with decreasing abdominal discomfort and nausea. She had a followup CT scan done that showed resolution of the abscess, so the drainage catheter was removed. Subsequently she has done well other than having some loose bowel movements with C. difficile testing negative. Her appetite remains fair and she is running a albumin level less than 2.0. The plan is for her to continue ciprofloxacin by mouth and work hard at improving her nutrition status as an outpatient, with eventual gallbladder surgery when her  nutrition status is improved. On the day of discharge she ate about 50% of breakfast and was not having significant nausea or diarrhea or shortness of breath or fever or chills. She is able to and relate in the room independently. There were no complications from her hospitalization.  Consults: general surgery and Interventional radiology  Procedures during her hospitalization included a 10/03/2013 MRCP, and 10/02/2013 CT-guided percutaneous abscess drainage, a 10/07/2013 CT scan of the abdomen and pelvis, and a 10/07/2013 removal of percutaneous drainage catheter.  Significant Diagnostic Studies:  Ir Sinus/fist Tube Chk-non Gi  10/07/2013   CLINICAL DATA:  78 year old female with a history of diverticular abscess status post placement of a CT-guided trans gluteal drain on 10/02/2013. She has subsequently recovered is now feeling well. A CT of the abdomen and pelvis were obtained earlier today demonstrates no residual fluid collection or abscess. She presents to Interventional Radiology for contrast injection under fluoroscopy to exclude a fistulous connection with the adjacent sigmoid colon.  EXAM: SINUS TRACT INJECTION/FISTULOGRAM  TECHNIQUE: Hand injection of contrast through the existing right trans gluteal pigtail drainage catheter with fluoroscopic imaging.  COMPARISON:  CT abdomen/pelvis obtained earlier today; prior CT abdomen/ pelvis 10/01/2013  FLUOROSCOPY TIME:  36 seconds  FINDINGS: A contrast injection demonstrates filling of a minimal collapsed residual abscess collection. There is no evidence of fistulous connection with the colon. The injected material was aspirated. The drain was severed and removed. A sterile bandage was placed. The patient tolerated the procedure well; there was no immediate complication.  IMPRESSION: No evidence of fistulous connection between the tube and of the sigmoid colon. There is a small residual collapsed cavity.  The drain was removed.  Signed,  Sterling Big, MD  [facility]  [facility]   Electronically Signed   By: Malachy Moan M.D.   On: 10/07/2013 15:25    Labs: Lab Results  Component Value Date   WBC 12.5* 10/09/2013   HGB 10.5* 10/09/2013   HCT 32.4* 10/09/2013   MCV 100.6* 10/09/2013   PLT 352 10/09/2013     Recent Labs Lab 10/09/13 0409  NA 136  K 4.0  CL 104  CO2 20  BUN <3*  CREATININE 0.52  CALCIUM 8.2*  PROT 5.1*  BILITOT 0.4  ALKPHOS 197*  ALT 39*  AST 13  GLUCOSE 96       No results found for this basename: INR, PROTIME     Recent Results (from the past 240 hour(s))  CULTURE, ROUTINE-ABSCESS     Status: None   Collection Time    10/02/13 11:43 AM      Result Value Range Status   Specimen Description ABSCESS   Final   Special Requests PELVIC CUL DE SAC   Final   Gram Stain     Final   Value: FEW WBC PRESENT, PREDOMINANTLY PMN     NO SQUAMOUS EPITHELIAL CELLS SEEN     FEW GRAM NEGATIVE RODS     Performed at [facility]   Culture     Final   Value: MODERATE ESCHERICHIA COLI     Performed at [facility]   Report Status 10/04/2013 FINAL   Final   Organism ID, Bacteria ESCHERICHIA COLI   Final  ANAEROBIC CULTURE     Status: None   Collection Time    10/02/13 11:43 AM      Result Value Range Status   Specimen Description ABSCESS   Final   Special Requests PELVIC CUL DE SAC   Final   Gram Stain     Final   Value: FEW WBC PRESENT, PREDOMINANTLY PMN     NO SQUAMOUS EPITHELIAL CELLS SEEN     FEW GRAM NEGATIVE RODS     Performed at [facility]   Culture     Final   Value: NO ANAEROBES ISOLATED     Performed at [facility]   Report Status 10/07/2013 FINAL   Final  CLOSTRIDIUM DIFFICILE BY PCR     Status: None   Collection Time    10/08/13  1:36 PM      Result Value Range Status   C difficile by pcr NEGATIVE  NEGATIVE Final      Discharge Exam: Blood pressure 114/52, pulse 78, temperature 98 F (36.7  C), temperature source Oral, resp. rate 18, height 5' 2.5" (1.588 m), weight 46.72 kg (103 lb), SpO2 96.00%.  Physical Exam: In general, the patient is a frail elderly white woman who was in no apparent distress while lying partially upright in bed. HEENT exam was within normal limits, neck was supple without jugular venous distention, chest was clear to auscultation, heart had a regular rate and rhythm and was without significant murmur or gallop, abdomen had mild distention, no tenderness, and mild diffuse tympany with normal bowel sounds. Extremities were without cyanosis, clubbing, or edema. She was alert and well oriented showing no focal neurologic deficits.  Disposition: She'll be discharged from the hospital to return home in the company of her husband. She'll be encouraged to take nutrition supplements and eat high quality food to improve her nutrition status. If she has diarrhea she should take over-the-counter Kaopectate for this. If she has severe diarrhea  with fever she should call her primary care physician.  Discharge Orders   Future Orders Complete By Expires   Call MD for:  As directed    Comments:     Call physician for fever, chills, worsened abdominal pain, nausea, vomiting, severe diarrhea or other concerning symptoms   Diet - low sodium heart healthy  As directed    Discharge instructions  As directed    Comments:     Do your best to eat well upon discharge to home. Also do not restart smoking as this delays recovery.   Increase activity slowly  As directed        Medication List         busPIRone 10 MG tablet  Commonly known as:  BUSPAR  Take 10 mg by mouth 2 (two) times daily.     ciprofloxacin 500 MG tablet  Commonly known as:  CIPRO  Take 1 tablet (500 mg total) by mouth 2 (two) times daily.     ezetimibe-simvastatin 10-80 MG per tablet  Commonly known as:  VYTORIN  Take 1 tablet by mouth at bedtime.     feeding supplement (ENSURE COMPLETE) Liqd  Take  237 mLs by mouth 2 (two) times daily between meals.     folic acid 400 MCG tablet  Commonly known as:  FOLVITE  Take 400 mcg by mouth daily.     levothyroxine 75 MCG tablet  Commonly known as:  SYNTHROID, LEVOTHROID  Take 75 mcg by mouth daily before breakfast.     loratadine 10 MG tablet  Commonly known as:  CLARITIN  Take 10 mg by mouth daily.     losartan 50 MG tablet  Commonly known as:  COZAAR  Take 50 mg by mouth every morning.     multivitamin with minerals Tabs tablet  Take 1 tablet by mouth daily.     potassium chloride 20 MEQ packet  Commonly known as:  KLOR-CON  Take 20 mEq by mouth daily.     zaleplon 10 MG capsule  Commonly known as:  SONATA  Take 10 mg by mouth at bedtime.           Follow-up Information   Follow up with Julian Hy, MD. Schedule an appointment as soon as possible for a visit in 1 week.   Specialty:  Endocrinology   Contact information:   [address] [place] [address] [phone]       Follow up with Mariella Saa, MD. Schedule an appointment as soon as possible for a visit in 3 weeks.   Specialty:  General Surgery   Contact information:   [address] [place] [address] [phone]       Signed: Garlan Fillers 10/09/2013, 12:40 PM

## 2013-10-10 LAB — BASIC METABOLIC PANEL
BUN: <3 mg/dL — ABNORMAL LOW (ref 6–23)
CO2: 20 mEq/L (ref 19–32)
Calcium: 8.6 mg/dL (ref 8.4–10.5)
Chloride: 101 mEq/L (ref 96–112)
Creatinine, Ser: 0.55 mg/dL (ref 0.50–1.10)
GFR calc Af Amer: >90 mL/min (ref >90)
GFR calc non Af Amer: 89 mL/min — ABNORMAL LOW (ref >90)
Glucose, Bld: 104 mg/dL — ABNORMAL HIGH (ref 70–99)
Potassium: 4.2 mEq/L (ref 3.5–5.1)
Sodium: 134 mEq/L — ABNORMAL LOW (ref 135–145)

## 2013-10-10 LAB — CBC
HCT: 32.7 % — ABNORMAL LOW (ref 36.0–46.0)
Hemoglobin: 10.7 g/dL — ABNORMAL LOW (ref 12.0–15.0)
MCH: 32.5 pg (ref 26.0–34.0)
MCHC: 32.7 g/dL (ref 30.0–36.0)
MCV: 99.4 fL (ref 78.0–100.0)
Platelets: 385 10*3/uL (ref 150–400)
RBC: 3.29 MIL/uL — ABNORMAL LOW (ref 3.87–5.11)
RDW: 13.9 % (ref 11.5–15.5)
WBC: 13.9 10*3/uL — ABNORMAL HIGH (ref 4.0–10.5)

## 2013-10-10 MED ORDER — VANCOMYCIN 50 MG/ML ORAL SOLUTION
250.0000 mg | Freq: Four times a day (QID) | ORAL | Status: DC
  Administered 2013-10-10 – 2013-10-12 (×8): 250 mg via ORAL
  Filled 2013-10-10 (×12): qty 5

## 2013-10-10 NOTE — Progress Notes (Signed)
Subjective: She continues to eat poorly and is having more diarrhea than she indicates.  Objective: Vital signs in last 24 hours: Temp:  [98.7 F (37.1 C)-99.5 F (37.5 C)] 99.3 F (37.4 C) (10/26 0438) Pulse Rate:  [82-93] 93 (10/26 0438) Resp:  [16-18] 18 (10/26 0438) BP: (108-111)/(47-52) 108/49 mmHg (10/26 0438) SpO2:  [96 %-98 %] 98 % (10/26 0438) Weight change:    Intake/Output from previous day: 10/25 0701 - 10/26 0700 In: 590 [P.O.:360; I.V.:230] Out: -    General appearance: alert, cooperative and no distress Resp: clear to auscultation bilaterally Cardio: regular rate and rhythm GI: soft, non-tender; bowel sounds normal; no masses,  no organomegaly Extremities: extremities normal, atraumatic, no cyanosis or edema  Lab Results:  Recent Labs  10/09/13 0409 10/10/13 0533  WBC 12.5* 13.9*  HGB 10.5* 10.7*  HCT 32.4* 32.7*  PLT 352 385   BMET  Recent Labs  10/09/13 0409 10/10/13 0533  NA 136 134*  K 4.0 4.2  CL 104 101  CO2 20 20  GLUCOSE 96 104*  BUN <3* <3*  CREATININE 0.52 0.55  CALCIUM 8.2* 8.6   CMET CMP     Component Value Date/Time   NA 134* 10/10/2013 0533   K 4.2 10/10/2013 0533   CL 101 10/10/2013 0533   CO2 20 10/10/2013 0533   GLUCOSE 104* 10/10/2013 0533   BUN <3* 10/10/2013 0533   CREATININE 0.55 10/10/2013 0533   CALCIUM 8.6 10/10/2013 0533   PROT 5.1* 10/09/2013 0409   ALBUMIN 1.8* 10/09/2013 0409   AST 13 10/09/2013 0409   ALT 39* 10/09/2013 0409   ALKPHOS 197* 10/09/2013 0409   BILITOT 0.4 10/09/2013 0409   GFRNONAA 89* 10/10/2013 0533   GFRAA >90 10/10/2013 0533    CBG (last 3)  No results found for this basename: GLUCAP,  in the last 72 hours  INR RESULTS:   No results found for this basename: INR, PROTIME     Studies/Results: No results found.  Medications: I have reviewed the patient's current medications.  Assessment/Plan: #1 Fever and Leukocytosis: interval return of fever and increase in WBC count  are worrisome. Most likely causes are development of clostridium difficile colitis versus recurrent pelvic abscess. Will check stool for c. Diff, start empiric vancomycin po, continue IVF, and recheck CBC in the am. Hold on discharge for now. #2 Protein calorie malnutrition: severe and it was stressed to her that she needs to eat even if the food is not ideal or she will essentially die from malnutrition.   LOS: 9 days   Gunnard Dorrance G 10/10/2013, 8:56 AM

## 2013-10-11 LAB — CBC
HCT: 30.2 % — ABNORMAL LOW (ref 36.0–46.0)
Hemoglobin: 10 g/dL — ABNORMAL LOW (ref 12.0–15.0)
MCH: 32.9 pg (ref 26.0–34.0)
MCHC: 33.1 g/dL (ref 30.0–36.0)
MCV: 99.3 fL (ref 78.0–100.0)
Platelets: 363 10*3/uL (ref 150–400)
RBC: 3.04 MIL/uL — ABNORMAL LOW (ref 3.87–5.11)
RDW: 14 % (ref 11.5–15.5)
WBC: 11.7 10*3/uL — ABNORMAL HIGH (ref 4.0–10.5)

## 2013-10-11 LAB — BASIC METABOLIC PANEL
BUN: <3 mg/dL — ABNORMAL LOW (ref 6–23)
CO2: 21 mEq/L (ref 19–32)
Calcium: 8.3 mg/dL — ABNORMAL LOW (ref 8.4–10.5)
Chloride: 101 mEq/L (ref 96–112)
Creatinine, Ser: 0.51 mg/dL (ref 0.50–1.10)
GFR calc Af Amer: >90 mL/min (ref >90)
GFR calc non Af Amer: >90 mL/min (ref >90)
Glucose, Bld: 112 mg/dL — ABNORMAL HIGH (ref 70–99)
Potassium: 4.1 mEq/L (ref 3.5–5.1)
Sodium: 132 mEq/L — ABNORMAL LOW (ref 135–145)

## 2013-10-11 NOTE — Progress Notes (Signed)
Chaplain offered pastoral care and support to the patient. Patient said she has no pastoral needs. Patient said she is just looking forward to getting released today. Chaplain will follow up if needed or requested.   10/11/13 1500  Clinical Encounter Type  Visited With Patient  Visit Type Initial;Social support

## 2013-10-11 NOTE — Progress Notes (Signed)
Subjective: Did well overnight. No loose stools at all yesterday. One this AM. No abd pain. No f/c/s Breathing OK. Eating is fair at best  Objective: Vital signs in last 24 hours: Temp:  [98.8 F (37.1 C)-99 F (37.2 C)] 99 F (37.2 C) (10/27 0523) Pulse Rate:  [82-93] 85 (10/27 0523) Resp:  [18] 18 (10/27 0523) BP: (103-128)/(48-81) 103/48 mmHg (10/27 0523) SpO2:  [95 %-97 %] 95 % (10/27 0523)  Intake/Output from previous day: 10/26 0701 - 10/27 0700 In: 240 [P.O.:240] Out: -  Intake/Output this shift:    Sitting up looking better. Sclera anicteric, oral membranes moist. Neck supple lungs clear no wheeze. Ht regular abd non distended. RUQ is non tender. Good BS's awake, a bit stronger, in good spirits  Lab Results   Recent Labs  10/10/13 0533 10/11/13 0500  WBC 13.9* 11.7*  RBC 3.29* 3.04*  HGB 10.7* 10.0*  HCT 32.7* 30.2*  MCV 99.4 99.3  MCH 32.5 32.9  RDW 13.9 14.0  PLT 385 363    Recent Labs  10/10/13 0533 10/11/13 0500  NA 134* 132*  K 4.2 4.1  CL 101 101  CO2 20 21  GLUCOSE 104* 112*  BUN <3* <3*  CREATININE 0.55 0.51  CALCIUM 8.6 8.3*    Studies/Results: No results found.  Scheduled Meds: . busPIRone  10 mg Oral BID  . ciprofloxacin  500 mg Oral BID  . feeding supplement (ENSURE COMPLETE)  237 mL Oral BID BM  . levothyroxine  75 mcg Oral QAC breakfast  . multivitamin with minerals  1 tablet Oral Daily  . potassium chloride  20 mEq Oral BID  . vancomycin  250 mg Oral Q6H   Continuous Infusions: . 0.9 % NaCl with KCl 20 mEq / L 100 mL/hr at 10/10/13 1956   PRN Meds:diphenhydrAMINE, ondansetron, oxyCODONE, promethazine, temazepam, traMADol  Assessment/Plan:  PELVIC ABSCESS/DIVERTICULITIS: CT much improved. WBC a bit better. No abd pain no fever DIARRHEA: first Cdiff neg, another sent. Not getting worse, could rx as outpt  CHOLECYSTITIS: Still present. ? Timing of surgery  HYPOTENSION: improve HYPOTHYROID: on Rx  ANXIETY:On Rx   MALNUTRITION: We havent made much progress  ANEMIA: improved HYPONATREMIA: mild    LOS: 10 days   [name] ALAN 10/11/2013, 8:05 AM

## 2013-10-11 NOTE — Progress Notes (Signed)
Patient ID: [name], female   DOB: 02-Mar-1935, 78 y.o.   MRN: [id number]    Subjective: Patient doing well today. She wants to go home. Tolerating minimal amounts of her meal, she states her appetite is present but does not care for the food here. She is pain free. One episode of diarrhea this morning. Ambulating well.   Objective: Vital signs in last 24 hours: Temp:  [98.8 F (37.1 C)-99 F (37.2 C)] 99 F (37.2 C) (10/27 0523) Pulse Rate:  [82-93] 85 (10/27 0523) Resp:  [18] 18 (10/27 0523) BP: (103-128)/(48-81) 103/48 mmHg (10/27 0523) SpO2:  [95 %-97 %] 95 % (10/27 0523) Last BM Date: 10/09/13  Intake/Output from previous day: 10/26 0701 - 10/27 0700 In: 240 [P.O.:240] Out: -  Exam: Gen: NAD. Pleasant, working crossword puzzle in bed.  CV: RRR  Chest: CTAB, no wheeze or crackles Abd: Soft. flat. NTND. BS present. No Masses palpated.  Ext: No erythema, edema or tenderness.    Lab Results:   Recent Labs  10/10/13 0533 10/11/13 0500  WBC 13.9* 11.7*  HGB 10.7* 10.0*  HCT 32.7* 30.2*  PLT 385 363    BMET  Recent Labs  10/10/13 0533 10/11/13 0500  NA 134* 132*  K 4.2 4.1  CL 101 101  CO2 20 21  GLUCOSE 104* 112*  BUN <3* <3*  CREATININE 0.55 0.51  CALCIUM 8.6 8.3*   PT/INR No results found for this basename: LABPROT, INR,  in the last 72 hours   Recent Labs Lab 10/05/13 0519 10/06/13 0428 10/07/13 0437 10/08/13 0500 10/09/13 0409  AST 201* 121* 33 21 13  ALT 103* 155* 90* 70* 39*  ALKPHOS 389* 424* 301* 290* 197*  BILITOT 0.8 0.5 0.3 0.5 0.4  PROT 4.8* 5.0* 5.1* 6.1 5.1*  ALBUMIN 1.9* 1.9* 1.8* 2.2* 1.8*     Lipase  No results found for this basename: lipase     Studies/Results: No results found.  Medications: . busPIRone  10 mg Oral BID  . ciprofloxacin  500 mg Oral BID  . feeding supplement (ENSURE COMPLETE)  237 mL Oral BID BM  . levothyroxine  75 mcg Oral QAC breakfast  . multivitamin with minerals  1 tablet Oral Daily   . potassium chloride  20 mEq Oral BID  . vancomycin  250 mg Oral Q6H    Assessment:  1. Presumed diverticulitis with fluid collection, s/p perc drain 10/02/13 in IR, removed 10/07/2013 2. Chronic cholecystitis 3.  Diarrhea: C diff Negative 4.  Hypotension   5.  Anxiety  6.  Anemia 7.  Malnutrition 8. Hyponatremia 9. Leukocytosis  Plan:  Diverticulitis with pelvic abscess s/p drain and removal of drain: CT much improved. Cultures E.coli. Patient without pain.  Leukocytosis: Resolving s/p drain and abx coverage. Patient currently on Cipro Diarrhea: Cdiff neg. 3 stools yesterday. Cholecystitis: chronic calculus cholecystitis, will f/u as outpatient, patient is in understanding of plan.  She looks good from surgical standpoint for discharge. Will need follow up with Dr. Johna Sheriff in 3 weeks.    LOS: 10 days     Felix Pacini, DO 10/11/2013

## 2013-10-11 NOTE — Progress Notes (Signed)
Physical Therapy Treatment Patient Details Name: [name] MRN: [id number] DOB: [birth year] Today's Date: 10/11/2013 Time: 7846-9629 PT Time Calculation (min): 14 min  PT Assessment / Plan / Recommendation  History of Present Illness Pt admitted with abdominal pain due to pelvic abscess due to diverticulitis, chronic cholecystitis.   PT Comments   Pt did well with gait, however was self-limiting and did not want to go further.  Pt declined stairs. Pt planning on d/c'ing today; no further questions at this time.  Follow Up Recommendations  Home health PT;Supervision/Assistance - 24 hour     Does the patient have the potential to tolerate intense rehabilitation     Barriers to Discharge        Equipment Recommendations  None recommended by PT    Recommendations for Other Services    Frequency Min 3X/week   Progress towards PT Goals Progress towards PT goals: Progressing toward goals  Plan Current plan remains appropriate    Precautions / Restrictions Precautions Precautions: Fall Restrictions Weight Bearing Restrictions: No   Pertinent Vitals/Pain No complaints of pain.    Mobility  Bed Mobility Bed Mobility: Supine to Sit;Sitting - Scoot to Edge of Bed Supine to Sit: 6: Modified independent (Device/Increase time) Sitting - Scoot to Edge of Bed: 6: Modified independent (Device/Increase time) Sit to Supine: 6: Modified independent (Device/Increase time) Transfers Transfers: Sit to Stand;Stand to Sit Sit to Stand: 6: Modified independent (Device/Increase time) Stand to Sit: 6: Modified independent (Device/Increase time) Ambulation/Gait Ambulation/Gait Assistance: 5: Supervision Ambulation Distance (Feet): 100 Feet Assistive device: Other (Comment) (IV Pole) Ambulation/Gait Assistance Details: Pt self-limiting; she did not want to go any further. Gait Pattern: Step-through pattern;Decreased stride length Gait velocity: Slow gait speed Stairs: No Wheelchair  Mobility Wheelchair Mobility: No    Exercises General Exercises - Lower Extremity Long Arc Quad: AROM;Both;10 reps Heel Slides: AROM;Both;10 reps Hip ABduction/ADduction: AROM;Both;10 reps;Supine Hip Flexion/Marching: AROM;Both;10 reps   PT Diagnosis:    PT Problem List:   PT Treatment Interventions:     PT Goals (current goals can now be found in the care plan section)    Visit Information  Last PT Received On: 10/11/13 Assistance Needed: +1 History of Present Illness: Pt admitted with abdominal pain due to pelvic abscess due to diverticulitis, chronic cholecystitis.    Subjective Data      Cognition  Cognition Arousal/Alertness: Awake/alert Behavior During Therapy: WFL for tasks assessed/performed Overall Cognitive Status: Within Functional Limits for tasks assessed    Balance     End of Session PT - End of Session Equipment Utilized During Treatment: Gait belt Activity Tolerance: Patient tolerated treatment well;Other (comment) (self-limiting) Patient left: in bed;with call bell/phone within reach   GP     MinnickIrving Burton , SPTA  10/11/2013, 11:14 AM

## 2013-10-11 NOTE — Progress Notes (Signed)
Seen and agreed 10/11/2013 [name] PTA [phone] pager [phone] office    

## 2013-10-11 NOTE — Progress Notes (Signed)
Occupational Therapy Treatment Patient Details Name: [name] MRN: [id number] DOB: [birth year] Today's Date: 10/11/2013 Time: 4098-1191 OT Time Calculation (min): 18 min  OT Assessment / Plan / Recommendation  History of present illness Pt admitted with abdominal pain due to pelvic abscess due to diverticulitis, chronic cholecystitis.   OT comments  Educated on energy conservation and also had pt perform UE exercises. Pt planning to d/c soon.   Follow Up Recommendations  No OT follow up;Supervision/Assistance - 24 hour    Barriers to Discharge       Equipment Recommendations  3 in 1 bedside comode    Recommendations for Other Services    Frequency Min 2X/week   Progress towards OT Goals Progress towards OT goals: Progressing toward goals  Plan Discharge plan remains appropriate    Precautions / Restrictions Precautions Precautions: Fall Restrictions Weight Bearing Restrictions: No   Pertinent Vitals/Pain No pain reported.     ADL  Grooming: Performed;Teeth care;Supervision/safety;Set up Where Assessed - Grooming: Supported standing Toilet Transfer: Modified independent Statistician Method: Sit to Barista: Bedside commode;Comfort height toilet;Grab bars Toileting - Architect and Hygiene: Supervision/safety Where Assessed - Engineer, mining and Hygiene: Standing;Sit on 3-in-1 or toilet Transfers/Ambulation Related to ADLs: Supervision/Min guard for ambulation. Mod I for transfers. ADL Comments: Educated on energy conservation and pt also performed theraband UE exercises. Pt ambulated to bathroom for teeth care and performed toileting tasks. Educated to have husband pick up rugs in house and to sit to bathe and have him with her for tub transfer. Spoke with her about DME for shower.     OT Diagnosis:    OT Problem List:   OT Treatment Interventions:     OT Goals(current goals can now be found in the care plan  section) Acute Rehab OT Goals Patient Stated Goal: go home OT Goal Formulation: With patient Time For Goal Achievement: 10/18/13 Potential to Achieve Goals: Good ADL Goals Pt Will Perform Grooming: with modified independence;standing Pt Will Perform Lower Body Bathing: with modified independence;sit to/from stand Pt Will Perform Lower Body Dressing: with modified independence;sit to/from stand Pt Will Transfer to Toilet: with modified independence;ambulating Pt Will Perform Toileting - Clothing Manipulation and hygiene: with modified independence;sit to/from stand Pt Will Perform Tub/Shower Transfer: Shower transfer;with supervision;ambulating  Visit Information  Last OT Received On: 10/11/13 Assistance Needed: +1 History of Present Illness: Pt admitted with abdominal pain due to pelvic abscess due to diverticulitis, chronic cholecystitis.    Subjective Data      Prior Functioning       Cognition  Cognition Arousal/Alertness: Awake/alert Behavior During Therapy: WFL for tasks assessed/performed Overall Cognitive Status: Within Functional Limits for tasks assessed    Mobility  Bed Mobility Bed Mobility: Supine to Sit;Sit to Supine Supine to Sit: 6: Modified independent (Device/Increase time) Sit to Supine: 6: Modified independent (Device/Increase time) Transfers Transfers: Sit to Stand;Stand to Sit Sit to Stand: 6: Modified independent (Device/Increase time);From bed;From chair/3-in-1;From toilet Stand to Sit: 6: Modified independent (Device/Increase time);To bed;To chair/3-in-1;To toilet    Exercises  Other Exercises Other Exercises: UE theraband exercises   Balance     End of Session OT - End of Session Activity Tolerance: Patient tolerated treatment well Patient left: in bed;with call bell/phone within reach Nurse Communication: Other (comment) (told tech IV beeping)  GO     Earlie Raveling OTR/L [phone] 10/11/2013, 5:29 PM

## 2013-10-11 NOTE — Progress Notes (Signed)
OK for discharge per primary team. F/U in next 2-3 weeks with Dr. Johna Sheriff or Orie Rout. Corliss Skains, MD, [facility]  General/ Trauma Surgery  10/11/2013 1:16 PM

## 2013-10-12 LAB — CBC
HCT: 29.8 % — ABNORMAL LOW (ref 36.0–46.0)
Hemoglobin: 10.1 g/dL — ABNORMAL LOW (ref 12.0–15.0)
MCH: 33 pg (ref 26.0–34.0)
MCHC: 33.9 g/dL (ref 30.0–36.0)
MCV: 97.4 fL (ref 78.0–100.0)
Platelets: 393 10*3/uL (ref 150–400)
RBC: 3.06 MIL/uL — ABNORMAL LOW (ref 3.87–5.11)
RDW: 14.1 % (ref 11.5–15.5)
WBC: 10.8 10*3/uL — ABNORMAL HIGH (ref 4.0–10.5)

## 2013-10-12 LAB — BASIC METABOLIC PANEL
BUN: <3 mg/dL — ABNORMAL LOW (ref 6–23)
CO2: 20 mEq/L (ref 19–32)
Calcium: 8.3 mg/dL — ABNORMAL LOW (ref 8.4–10.5)
Chloride: 100 mEq/L (ref 96–112)
Creatinine, Ser: 0.5 mg/dL (ref 0.50–1.10)
GFR calc Af Amer: >90 mL/min (ref >90)
GFR calc non Af Amer: >90 mL/min (ref >90)
Glucose, Bld: 107 mg/dL — ABNORMAL HIGH (ref 70–99)
Potassium: 3.9 mEq/L (ref 3.5–5.1)
Sodium: 130 mEq/L — ABNORMAL LOW (ref 135–145)

## 2013-10-12 NOTE — Progress Notes (Signed)
Seen and agreed 10/12/2013 Vianey Caniglia Elizabeth PTA [phone] pager [phone] office    

## 2013-10-12 NOTE — Progress Notes (Signed)
Physical Therapy Treatment Patient Details Name: [name] MRN: [id number] DOB: [birth year] Today's Date: 10/12/2013 Time: 5284-1324 PT Time Calculation (min): 10 min  PT Assessment / Plan / Recommendation  History of Present Illness Pt admitted with abdominal pain due to pelvic abscess due to diverticulitis, chronic cholecystitis.   PT Comments   Pt required motivation today from SPTA and husband.  Pt was fatigued after stairs, requiring standing rest break.  Follow Up Recommendations  Home health PT;Supervision/Assistance - 24 hour     Does the patient have the potential to tolerate intense rehabilitation     Barriers to Discharge        Equipment Recommendations  None recommended by PT    Recommendations for Other Services    Frequency Min 3X/week   Progress towards PT Goals Progress towards PT goals: Progressing toward goals  Plan Current plan remains appropriate    Precautions / Restrictions Precautions Precautions: Fall Restrictions Weight Bearing Restrictions: No   Pertinent Vitals/Pain Denied pain    Mobility  Bed Mobility Bed Mobility: Not assessed Transfers Transfers: Sit to Stand;Stand to Sit Sit to Stand: 6: Modified independent (Device/Increase time);With upper extremity assist;Other (comment) (From couch) Stand to Sit: 6: Modified independent (Device/Increase time);With upper extremity assist;Other (comment) (from couch) Ambulation/Gait - 267ft Ambulation/Gait Assistance: 5: Supervision Assistive device: None Ambulation/Gait Assistance Details: Supervision required with gait for safety Gait Pattern: Shuffle;Step-through pattern;Decreased stride length Gait velocity: decreased General Gait Details: Pt had no AD but was reaching for rail and objects for reassurance Stairs: Yes Stairs Assistance: 5: Supervision Stairs Assistance Details (indicate cue type and reason): Cues to slow down for safety Stair Management Technique: One rail Left Number  of Stairs: 7 Wheelchair Mobility Wheelchair Mobility: No    Exercises     PT Diagnosis:    PT Problem List:   PT Treatment Interventions:     PT Goals (current goals can now be found in the care plan section)    Visit Information  Last PT Received On: 10/12/13 Assistance Needed: +1 History of Present Illness: Pt admitted with abdominal pain due to pelvic abscess due to diverticulitis, chronic cholecystitis.    Subjective Data      Cognition  Cognition Arousal/Alertness: Awake/alert Behavior During Therapy: WFL for tasks assessed/performed Overall Cognitive Status: Within Functional Limits for tasks assessed    Balance     End of Session PT - End of Session Activity Tolerance: Patient tolerated treatment well;Other (comment) (Required motivation from Clinical research associate and husband) Patient left: Other (comment);with family/visitor present (On couch) Nurse Communication: Mobility status   GP     Ernestina Columbia, SPTA 10/12/2013, 8:58 AM

## 2013-10-12 NOTE — Discharge Summary (Signed)
DISCHARGE SUMMARY  [name]  MR#: [id number]  DOB:09-20-1935  Date of Admission: 10/01/2013 Date of Discharge: 10/12/2013  Attending Physician:Maziah Smola [name]  Patient's WUJ:[id number] Hessie Diener, MD  Consults:  general surgery, interventional radiology  Discharge Diagnoses: Principal Problem:   Pericolonic abscess due to diverticulitis Active Problems:   Diverticular disease small and large intestine, perforation, abscess   Calculus of gallbladder with acute cholecystitis, without mention of obstruction   Loss of weight   Severe protein-calorie malnutrition   Tobacco use disorder   Diarrhea, clinically improved Hypothyroidism, on therapy Hyperlipidemia, on therapy Anxiety, on therapy Weakness and failure to thrive Hypertension, improved Chronic insomnia Mild hyponatremia Anemia, mild Adrenal nodular hyperplasia Hepatic cysts Ovarian cyst Ileus, resolved   Discharge Medications:   Medication List         busPIRone 10 MG tablet  Commonly known as:  BUSPAR  Take 10 mg by mouth 2 (two) times daily.     ciprofloxacin 500 MG tablet  Commonly known as:  CIPRO  Take 1 tablet (500 mg total) by mouth 2 (two) times daily.     ezetimibe-simvastatin 10-80 MG per tablet  Commonly known as:  VYTORIN  Take 1 tablet by mouth at bedtime.     feeding supplement (ENSURE COMPLETE) Liqd  Take 237 mLs by mouth 2 (two) times daily between meals.     folic acid 400 MCG tablet  Commonly known as:  FOLVITE  Take 400 mcg by mouth daily.     levothyroxine 75 MCG tablet  Commonly known as:  SYNTHROID, LEVOTHROID  Take 75 mcg by mouth daily before breakfast.     loratadine 10 MG tablet  Commonly known as:  CLARITIN  Take 10 mg by mouth daily.     losartan 50 MG tablet  Commonly known as:  COZAAR  Take 50 mg by mouth every morning.     multivitamin with minerals Tabs tablet  Take 1 tablet by mouth daily.     potassium chloride 20 MEQ packet  Commonly known as:   KLOR-CON  Take 20 mEq by mouth daily.     zaleplon 10 MG capsule  Commonly known as:  SONATA  Take 10 mg by mouth at bedtime.        Hospital Procedures: Ct Abdomen Pelvis W Contrast  10/07/2013   CLINICAL DATA:  Diverticular abscess, followup  EXAM: CT ABDOMEN AND PELVIS WITH CONTRAST  TECHNIQUE: Multidetector CT imaging of the abdomen and pelvis was performed using the standard protocol following bolus administration of intravenous contrast.  CONTRAST:  80mL OMNIPAQUE IOHEXOL 300 MG/ML  SOLN  COMPARISON:  MRI 810 07/17/2013, CT 10/01/2013  FINDINGS: Marked reduction of the pelvic abscess following percutaneous drain placement. A pigtail catheter is in the deep right pelvis with no measurable fluid collection where previously there was a 6.7 by 5.1 cm fluid collection. Small collection more superiorly centrally within the pelvis is also resolved and barely measurable at 5 mm compared to 30 mm on prior. Rounded lesion measuring 14 mm (image 60) is unchanged may represent ovarian cyst.  There is interval increase in bilateral pleural effusions and left basilar atelectasis compared to prior.  Multiple low-density lesions within the liver likely represent benign cysts. Gallbladder is again demonstrated to have extensive bowel wall inflammation with fatty infiltration measuring up to 15 mm. There is a large stone occupying the entire lumen of the gallbladder. This chronic cholecystitis pattern was evaluated on recent MRI. There is mild intrahepatic biliary duct dilatation which is  similar prior. The common bile duct is normal caliber. The pancreas, spleen, kidneys are normal. Nodule enlargement of the adrenal glands is stable.  The stomach, small bowel and colon are unchanged. There is mild dilatation of the small bowel similar 2 prior suggesting ileus. Bladder and uterus are normal. No aggressive osseous lesion  IMPRESSION: 1. Near complete resolution of pelvic abscess following percutaneous drainage  catheter placement.  2. Mild small bowel ileus.  3.  Chronic calculus cholecystitis again demonstrated.  4. Interval increase in bilateral pleural effusions and basilar atelectasis.   Electronically Signed   By: Genevive Bi M.D.   On: 10/07/2013 12:38   Ct Abdomen Pelvis W Contrast  10/01/2013   CLINICAL DATA:  Mid abdominal pain and 10 lb weight loss.  BUN and creatinine were obtained on site at [facility] at [address]Results: BUN 8.0 mg/dL, Creatinine 0.6 mg/dL.  EXAM: CT ABDOMEN AND PELVIS WITH CONTRAST  TECHNIQUE: Multidetector CT imaging of the abdomen and pelvis was performed using the standard protocol following bolus administration of intravenous contrast.  CONTRAST:  OMNIPAQUE IOHEXOL 300 MG/ML SOLN, 40mL OMNIPAQUE IOHEXOL 300 MG/ML SOLN  COMPARISON:  03/14/2009.  FINDINGS: The lung bases are clear. No pleural effusion or pulmonary nodules. The heart is normal in size. No pericardial effusion.  The gallbladder is grossly abnormal. There are numerous small gallstones surrounded by a thickened gallbladder wall with cystic change. The gallbladder wall adjacent to the liver is somewhat irregular and on the liver windows there appears to be some infiltrative change in the liver. FINDINGS all are worrisome for gallbladder carcinoma with early liver INDICATION. There are multiple benign appearing hepatic cysts which are stable when compared to a prior CT scan. The common bile duct is normal in caliber. The pancreas is normal. The spleen is normal. There is a small calcified splenic artery aneurysm noted. The adrenal glands are unremarkable. There is a stable left adrenal gland adenoma. The kidneys are normal.  The stomach, duodenum, small bowel and colon are grossly normal. Cannot definitely exclude involvement of the splenic flexure or 2nd portion of the duodenum on this examination. There are small scattered mesenteric and retroperitoneal lymph nodes but no overt adenopathy.  In  the pelvis there are multiple pelvic abscesses. The largest abscesses in the deep right pelvis adjacent to the sigmoid colon and measures a maximum of 7 cm. Smaller abscesses are more anteriorly and just above the uterus. This is likely from perforated diverticulitis. The uterus and ovaries are grossly normal. The bladder is normal. No inguinal mass or adenopathy.  The bony structures are unremarkable. Moderate degenerative changes. There are smaller abscesses more superiorly. The uterus and ovaries are grossly normal.  IMPRESSION: 1. CT findings worrisome for gallbladder carcinoma likely E invading the adjacent liver. Could not exclude the possibility of involvement of the 2nd portion of the duodenum and the splenic flexure region of the colon MRI abdomen without and with contrast is recommended for further evaluation of this process. No common bile duct dilatation. 2. Multiple benign appearing and stable hepatic cysts. 3. Multiple pelvic abscesses likely due to perforated diverticulitis.  4. Critical Value/emergent results were called by telephone at the time of interpretation on 10/01/2013 at 3:02 PM to Dr.Yanni Quiroa .   Electronically Signed   By: Loralie Champagne M.D.   On: 10/01/2013 15:03   Ir Sinus/fist Tube Chk-non Gi  10/07/2013   CLINICAL DATA:  78 year old female with a history of diverticular abscess status  post placement of a CT-guided trans gluteal drain on 10/02/2013. She has subsequently recovered is now feeling well. A CT of the abdomen and pelvis were obtained earlier today demonstrates no residual fluid collection or abscess. She presents to Interventional Radiology for contrast injection under fluoroscopy to exclude a fistulous connection with the adjacent sigmoid colon.  EXAM: SINUS TRACT INJECTION/FISTULOGRAM  TECHNIQUE: Hand injection of contrast through the existing right trans gluteal pigtail drainage catheter with fluoroscopic imaging.  COMPARISON:  CT abdomen/pelvis obtained earlier  today; prior CT abdomen/ pelvis 10/01/2013  FLUOROSCOPY TIME:  36 seconds  FINDINGS: A contrast injection demonstrates filling of a minimal collapsed residual abscess collection. There is no evidence of fistulous connection with the colon. The injected material was aspirated. The drain was severed and removed. A sterile bandage was placed. The patient tolerated the procedure well; there was no immediate complication.  IMPRESSION: No evidence of fistulous connection between the tube and of the sigmoid colon. There is a small residual collapsed cavity.  The drain was removed.  Signed,  Sterling Big, MD  [facility]  [facility]   Electronically Signed   By: Malachy Moan M.D.   On: 10/07/2013 15:25   Mr 3d Recon At Scanner  10/03/2013   CLINICAL DATA:  Evaluate abnormal gallbladder demonstrated on recent CT scan.  EXAM: MRI ABDOMEN WITHOUT AND WITH CONTRAST (INCLUDING MRCP)  TECHNIQUE: Multiplanar multisequence MR imaging of the abdomen was performed both before and after the administration of intravenous contrast. Heavily T2-weighted images of the biliary and pancreatic ducts were obtained, and three-dimensional MRCP images were rendered by post processing.  CONTRAST:  10mL MULTIHANCE GADOBENATE DIMEGLUMINE 529 MG/ML IV SOLN  COMPARISON:  CT scan 03/14/2009 and 10/01/2013  FINDINGS: The gallbladder demonstrates marked wall thickening and enhancement postcontrast. There are multiple areas of cystic change in the gallbladder wall and numerous gallstones. I do not see a discrete enhancing mass to suggest gallbladder carcinoma. I think the findings are much more likely due to chronic cholecystitis. No findings for involvement/ invasion of the liver or duodenum. There are multiple benign appearing stable hepatic cysts. The common bile duct is normal in caliber and has a normal course. The pancreatic duct is normal. The pancreas is unremarkable. The spleen is  normal in size. No focal lesions. The adrenal glands are slightly enlarged and nodular suggesting nodular hyperplasia. The kidneys are unremarkable.  Mildly dilated small bowel loops and colon may be to a diffuse ileus.  No significant bony findings.  IMPRESSION: MR findings most consistent with chronic calculus cholecystitis. No definite gallbladder mass or involvement of the liver or duodenum.  Normal pancreaticobiliary tree.   Electronically Signed   By: Loralie Champagne M.D.   On: 10/03/2013 11:12   Mr Abd W/wo Cm/mrcp  10/03/2013   CLINICAL DATA:  Evaluate abnormal gallbladder demonstrated on recent CT scan.  EXAM: MRI ABDOMEN WITHOUT AND WITH CONTRAST (INCLUDING MRCP)  TECHNIQUE: Multiplanar multisequence MR imaging of the abdomen was performed both before and after the administration of intravenous contrast. Heavily T2-weighted images of the biliary and pancreatic ducts were obtained, and three-dimensional MRCP images were rendered by post processing.  CONTRAST:  10mL MULTIHANCE GADOBENATE DIMEGLUMINE 529 MG/ML IV SOLN  COMPARISON:  CT scan 03/14/2009 and 10/01/2013  FINDINGS: The gallbladder demonstrates marked wall thickening and enhancement postcontrast. There are multiple areas of cystic change in the gallbladder wall and numerous gallstones. I do not see a discrete enhancing mass to suggest gallbladder  carcinoma. I think the findings are much more likely due to chronic cholecystitis. No findings for involvement/ invasion of the liver or duodenum. There are multiple benign appearing stable hepatic cysts. The common bile duct is normal in caliber and has a normal course. The pancreatic duct is normal. The pancreas is unremarkable. The spleen is normal in size. No focal lesions. The adrenal glands are slightly enlarged and nodular suggesting nodular hyperplasia. The kidneys are unremarkable.  Mildly dilated small bowel loops and colon may be to a diffuse ileus.  No significant bony findings.  IMPRESSION:  MR findings most consistent with chronic calculus cholecystitis. No definite gallbladder mass or involvement of the liver or duodenum.  Normal pancreaticobiliary tree.   Electronically Signed   By: Loralie Champagne M.D.   On: 10/03/2013 11:12   Ct Image Guided Fluid Drain By Catheter  10/02/2013   CLINICAL DATA:  78 year old female with abdominal pain and the pelvic abscess on recent CT scan. She presents for percutaneous drainage.  EXAM: RADIOLOGY EXAMINATION  Date: 10/02/2013  TECHNIQUE: Informed consent was obtained from the patient following explanation of the procedure, risks, benefits and alternatives. The patient understands, agrees and consents for the procedure. All questions were addressed. A time out was performed.  Maximal barrier sterile technique utilized including caps, mask, sterile gowns, sterile gloves, large sterile drape, hand hygiene, and the Betadine skin prep.  The patient was placed in the prone position. A planning axial CT scan was performed. A suitable skin entry site was selected and marked. Local anesthesia was attained by infiltration with 1% lidocaine. Under CT fluoroscopic guidance, an 18 gauge trocar needle was carefully advanced through the sacral spinous ligament and into the deep pelvic fluid collection. Aspiration through the needle return thick, foul-smelling purulent fluid. Wire was then coiled within the fluid collection and the tract serially dilated to 14 [place]. A Cook 14 [place] all-purpose drain was then advanced over the wire and formed with the locking loop in the fluid collection. A total of 80 mL of thick, foul-smelling purulent material was aspirated. A sample was sent for culture. The tube was secured to the skin with 0 Prolene suture and an adhesive fixation device. A sterile bandage was applied.  The patient tolerated the procedure very well ; there was no immediate complication.  ANESTHESIA/SEDATION: Moderate (conscious) sedation was used. 1.5 mg  Versed, 50 mcg Fentanyl were administered intravenously. The patient's vital signs were monitored continuously by radiology nursing throughout the procedure.  Sedation Time: 20 minutes  CONTRAST:  None  FLUOROSCOPY TIME:  None  PROCEDURE: 1. CT-guided drain placement Interventional Radiologist:  Sterling Big, MD  IMPRESSION: Successful placement of a 75 French percutaneous drain into the putative diverticular abscess via a right trans gluteal approach.  80 mL of thick, foul smelling purulent fluid was aspirated. A sample was sent for culture.  Recommend maintaining tube to JP bulb suction for at least 24-48 hr. Convert to gravity bag drainage prior to discharge to minimize risk of fistula formation. Follow up CT scan of the pelvis and tube injection under fluoroscopy prior 2 drain removal.  Signed,  Sterling Big, MD  [facility]  [facility]   Electronically Signed   By: Malachy Moan M.D.   On: 10/02/2013 12:12    History of Present Illness: This is a 78 year old white female is on the office on the day prior to admission. A CT was completed on the day of admission  and she was found to have pelvic abscesses an abnormal gallbladder.  Hospital Course: The patient was admitted and treated with broad-spectrum antibiotics. Surgery consult was obtained. Interventional radiology saw her and placed drains in the pelvic abscesses on the second hospital day. The patient tolerated this well and is completely resolved the pelvic abscess problem after several days. Patient still has a very inflamed gallbladder with what appears to be a complicated chronic cholecystitis. Plans are for cholecystectomy probably as an open procedure which is clinically improved. Her clinical status is been complicated by protein calorie malnutrition. Her discharge is been delayed due to some diarrhea. Fortunately C. difficile is negative and her white count is now trended back  down. She did receive vancomycin orally for couple days and is now stopped. She also had poor tolerance to Augmentin and this is been changed to ciprofloxacin. Her by mouth intake is still marginal. She is strong enough to get around with some help now. Her pulmonary status is fragile. She is pain-free at the present time. She has no fevers chills or sweats. Her bowel movements have improved and she had no diarrhea overnight. She's had no cardiac issues during this hospitalization. It is hopeful that we get improved nutrition and stability for a few weeks before needing to do the gallbladder procedure. Her electrolytes are now doing well. Her leukocytosis is trended down. Her blood pressure is been low and now is improved. Although the initial scan suggests a possible malignant process in the gallbladder area, the imaging and tumor markers suggest differently.  Day of Discharge Exam BP 112/48  Pulse 90  Temp(Src) 99.5 F (37.5 C) (Oral)  Resp 18  Ht 5' 2.5" (1.588 m)  Wt 46.72 kg (103 lb)  BMI 18.53 kg/m2  SpO2 94%  Physical Exam: General appearance: Lying quietly in no distress. Eyes: no scleral icterus. Gaze is conjugate no nystagmus is present. Throat: oropharynx moist without erythema Resp: Slightly distant but no wheezes rales or rhonchi. No accessory muscles are in use. Cardio: Regular with systolic murmur. GI: soft, nondistended non-tender; bowel sounds are good ; no masses,  no organomegaly Extremities: Pulses are slightly diminished no edema is present. Neuro: She is awake alert and mentating well. Speech is clear. No tremor is present. She is globally weak.  Discharge Labs:  Recent Labs  10/10/13 0533 10/11/13 0500  NA 134* 132*  K 4.2 4.1  CL 101 101  CO2 20 21  GLUCOSE 104* 112*  BUN <3* <3*  CREATININE 0.55 0.51  CALCIUM 8.6 8.3*     Recent Labs  10/11/13 0500 10/12/13 0605  WBC 11.7* 10.8*  HGB 10.0* 10.1*  HCT 30.2* 29.8*  MCV 99.3 97.4  PLT 363 393    Results for VANNIE, HILGERT (MRN [id number]) as of 10/12/2013 07:39  Ref. Range 10/09/2013 04:09  Albumin Latest Range: 3.5-5.2 g/dL 1.8 (L)  AST Latest Range: 0-37 U/L 13  ALT Latest Range: 0-35 U/L 39 (H)  Total Protein Latest Range: 6.0-8.3 g/dL 5.1 (L)  Bilirubin, Direct Latest Range: 0.0-0.3 mg/dL 0.1  Indirect Bilirubin Latest Range: 0.3-0.9 mg/dL 0.3  Total Bilirubin Latest Range: 0.3-1.2 mg/dL 0.4   Results for AWILDA, COVIN (MRN [id number]) as of 10/12/2013 07:39  Ref. Range 10/08/2013 13:36  C difficile by pcr Latest Range: NEGATIVE  NEGATIVE    Results for ZAYLYNN, RICKETT (MRN [id number]) as of 10/12/2013 07:39  Ref. Range 10/02/2013 11:41  CA 19-9 Latest Range: <35.0 U/mL 28.8 (L)  CEA  Latest Range: 0.0-5.0 ng/mL 1.4    Discharge instructions:     Discharge Orders   Future Orders Complete By Expires   Call MD for:  As directed    Comments:     Call physician for fever, chills, worsened abdominal pain, nausea, vomiting, severe diarrhea or other concerning symptoms   Diet - low sodium heart healthy  As directed    Discharge instructions  As directed    Comments:     Do your best to eat well upon discharge to home. Also do not restart smoking as this delays recovery.   Increase activity slowly  As directed       Disposition: To home  Follow-up Appts: Follow-up with Dr. Evlyn Kanner at [facility] in 1 week  Call for appointment.  Condition on Discharge: Improved  Tests Needing Follow-up: None  Signed: Astria Jordahl [name] 10/12/2013, 7:38 AM

## 2013-10-12 NOTE — Care Management Note (Signed)
  Page 1 of 1   10/12/2013     8:20:05 AM   CARE MANAGEMENT NOTE 10/12/2013  Patient:  [name], [name]   Account Number:  [id number]  Date Initiated:  10/12/2013  Documentation initiated by:  Ronny Flurry  Subjective/Objective Assessment:     Action/Plan:   Anticipated DC Date:  10/12/2013   Anticipated DC Plan:  HOME W HOME HEALTH SERVICES         Choice offered to / List presented to:  C-1 Patient   DME arranged  3-N-1      DME agency  [facility]     HH arranged  HH-2 PT      [facility] agency  [facility]   Status of service:  Completed, signed off Medicare Important Message given?   (If response is "NO", the following Medicare IM given date fields will be blank) Date Medicare IM given:   Date Additional Medicare IM given:    Discharge Disposition:    Per UR Regulation:    If discussed at Long Length of Stay Meetings, dates discussed:    Comments:  10-12-13 Facesheet information confirmed with patient . Ronny Flurry RN BSN [phone]

## 2013-10-24 ENCOUNTER — Encounter (HOSPITAL_COMMUNITY): Payer: Self-pay | Admitting: Radiology

## 2013-10-24 ENCOUNTER — Emergency Department (HOSPITAL_COMMUNITY): Payer: BC Managed Care – PPO

## 2013-10-24 ENCOUNTER — Inpatient Hospital Stay (HOSPITAL_COMMUNITY)
Admission: EM | Admit: 2013-10-24 | Discharge: 2013-11-08 | DRG: 408 | Disposition: A | Discharge status: Skilled Nursing Facility | Payer: BC Managed Care – PPO | Attending: General Surgery | Admitting: General Surgery

## 2013-10-24 DIAGNOSIS — E43 Unspecified severe protein-calorie malnutrition: Secondary | ICD-10-CM | POA: Diagnosis present

## 2013-10-24 DIAGNOSIS — Z79899 Other long term (current) drug therapy: Secondary | ICD-10-CM

## 2013-10-24 DIAGNOSIS — K632 Fistula of intestine: Secondary | ICD-10-CM | POA: Diagnosis present

## 2013-10-24 DIAGNOSIS — F17200 Nicotine dependence, unspecified, uncomplicated: Secondary | ICD-10-CM | POA: Diagnosis present

## 2013-10-24 DIAGNOSIS — L02219 Cutaneous abscess of trunk, unspecified: Secondary | ICD-10-CM | POA: Diagnosis present

## 2013-10-24 DIAGNOSIS — R531 Weakness: Secondary | ICD-10-CM

## 2013-10-24 DIAGNOSIS — Z5331 Laparoscopic surgical procedure converted to open procedure: Secondary | ICD-10-CM

## 2013-10-24 DIAGNOSIS — R5381 Other malaise: Secondary | ICD-10-CM | POA: Diagnosis present

## 2013-10-24 DIAGNOSIS — K929 Disease of digestive system, unspecified: Secondary | ICD-10-CM | POA: Diagnosis not present

## 2013-10-24 DIAGNOSIS — K565 Intestinal adhesions [bands], unspecified as to partial versus complete obstruction: Secondary | ICD-10-CM | POA: Diagnosis present

## 2013-10-24 DIAGNOSIS — F329 Major depressive disorder, single episode, unspecified: Secondary | ICD-10-CM | POA: Diagnosis not present

## 2013-10-24 DIAGNOSIS — R6510 Systemic inflammatory response syndrome (SIRS) of non-infectious origin without acute organ dysfunction: Secondary | ICD-10-CM

## 2013-10-24 DIAGNOSIS — I10 Essential (primary) hypertension: Secondary | ICD-10-CM | POA: Diagnosis present

## 2013-10-24 DIAGNOSIS — E46 Unspecified protein-calorie malnutrition: Secondary | ICD-10-CM

## 2013-10-24 DIAGNOSIS — E049 Nontoxic goiter, unspecified: Secondary | ICD-10-CM | POA: Diagnosis present

## 2013-10-24 DIAGNOSIS — L0331A Cellulitis of flank: Secondary | ICD-10-CM

## 2013-10-24 DIAGNOSIS — F3289 Other specified depressive episodes: Secondary | ICD-10-CM | POA: Diagnosis not present

## 2013-10-24 DIAGNOSIS — L03319 Cellulitis of trunk, unspecified: Secondary | ICD-10-CM

## 2013-10-24 DIAGNOSIS — D72829 Elevated white blood cell count, unspecified: Secondary | ICD-10-CM | POA: Diagnosis present

## 2013-10-24 DIAGNOSIS — K630 Abscess of intestine: Secondary | ICD-10-CM | POA: Diagnosis present

## 2013-10-24 DIAGNOSIS — K8010 Calculus of gallbladder with chronic cholecystitis without obstruction: Secondary | ICD-10-CM | POA: Diagnosis present

## 2013-10-24 DIAGNOSIS — K812 Acute cholecystitis with chronic cholecystitis: Secondary | ICD-10-CM

## 2013-10-24 DIAGNOSIS — E785 Hyperlipidemia, unspecified: Secondary | ICD-10-CM | POA: Diagnosis present

## 2013-10-24 DIAGNOSIS — K560 Paralytic ileus: Secondary | ICD-10-CM | POA: Diagnosis not present

## 2013-10-24 DIAGNOSIS — E039 Hypothyroidism, unspecified: Secondary | ICD-10-CM | POA: Diagnosis present

## 2013-10-24 DIAGNOSIS — Z681 Body mass index (BMI) 19 or less, adult: Secondary | ICD-10-CM

## 2013-10-24 DIAGNOSIS — K7689 Other specified diseases of liver: Secondary | ICD-10-CM | POA: Diagnosis present

## 2013-10-24 DIAGNOSIS — E876 Hypokalemia: Secondary | ICD-10-CM | POA: Diagnosis present

## 2013-10-24 DIAGNOSIS — K8000 Calculus of gallbladder with acute cholecystitis without obstruction: Principal | ICD-10-CM | POA: Diagnosis present

## 2013-10-24 DIAGNOSIS — D62 Acute posthemorrhagic anemia: Secondary | ICD-10-CM | POA: Diagnosis not present

## 2013-10-24 HISTORY — DX: Major depressive disorder, single episode, unspecified: F32.9

## 2013-10-24 HISTORY — DX: Essential (primary) hypertension: I10

## 2013-10-24 HISTORY — DX: Depression, unspecified: F32.A

## 2013-10-24 HISTORY — DX: Personal history of other diseases of the digestive system: Z87.19

## 2013-10-24 HISTORY — DX: Calculus of gallbladder with acute cholecystitis without obstruction: K80.00

## 2013-10-24 LAB — CBC WITH DIFFERENTIAL/PLATELET
Basophils Absolute: 0 10*3/uL (ref 0.0–0.1)
Basophils Relative: 0 % (ref 0–1)
Eosinophils Absolute: 0 10*3/uL (ref 0.0–0.7)
Eosinophils Relative: 0 % (ref 0–5)
HCT: 31.7 % — ABNORMAL LOW (ref 36.0–46.0)
Hemoglobin: 10.9 g/dL — ABNORMAL LOW (ref 12.0–15.0)
Lymphocytes Relative: 11 % — ABNORMAL LOW (ref 12–46)
Lymphs Abs: 1.7 10*3/uL (ref 0.7–4.0)
MCH: 31.9 pg (ref 26.0–34.0)
MCHC: 34.4 g/dL (ref 30.0–36.0)
MCV: 92.7 fL (ref 78.0–100.0)
Monocytes Absolute: 1.7 10*3/uL — ABNORMAL HIGH (ref 0.1–1.0)
Monocytes Relative: 11 % (ref 3–12)
Neutro Abs: 12.3 10*3/uL — ABNORMAL HIGH (ref 1.7–7.7)
Neutrophils Relative %: 78 % — ABNORMAL HIGH (ref 43–77)
Platelets: 368 10*3/uL (ref 150–400)
RBC: 3.42 MIL/uL — ABNORMAL LOW (ref 3.87–5.11)
RDW: 14.5 % (ref 11.5–15.5)
WBC: 15.7 10*3/uL — ABNORMAL HIGH (ref 4.0–10.5)

## 2013-10-24 LAB — COMPREHENSIVE METABOLIC PANEL
ALT: 78 U/L — ABNORMAL HIGH (ref 0–35)
AST: 46 U/L — ABNORMAL HIGH (ref 0–37)
Albumin: 2.7 g/dL — ABNORMAL LOW (ref 3.5–5.2)
Alkaline Phosphatase: 403 U/L — ABNORMAL HIGH (ref 39–117)
BUN: 10 mg/dL (ref 6–23)
CO2: 24 mEq/L (ref 19–32)
Calcium: 8.9 mg/dL (ref 8.4–10.5)
Chloride: 96 mEq/L (ref 96–112)
Creatinine, Ser: 0.57 mg/dL (ref 0.50–1.10)
GFR calc Af Amer: >90 mL/min (ref >90)
GFR calc non Af Amer: 88 mL/min — ABNORMAL LOW (ref >90)
Glucose, Bld: 131 mg/dL — ABNORMAL HIGH (ref 70–99)
Potassium: 3.2 mEq/L — ABNORMAL LOW (ref 3.5–5.1)
Sodium: 133 mEq/L — ABNORMAL LOW (ref 135–145)
Total Bilirubin: 0.7 mg/dL (ref 0.3–1.2)
Total Protein: 6.8 g/dL (ref 6.0–8.3)

## 2013-10-24 LAB — LACTIC ACID, PLASMA: Lactic Acid, Venous: 1.2 mmol/L (ref 0.5–2.2)

## 2013-10-24 MED ORDER — PIPERACILLIN-TAZOBACTAM 3.375 G IVPB 30 MIN
3.3750 g | Freq: Once | INTRAVENOUS | Status: AC
  Administered 2013-10-24: 3.375 g via INTRAVENOUS
  Filled 2013-10-24: qty 50

## 2013-10-24 MED ORDER — SODIUM CHLORIDE 0.9 % IV SOLN
1000.0000 mL | Freq: Once | INTRAVENOUS | Status: AC
  Administered 2013-10-24: 1000 mL via INTRAVENOUS

## 2013-10-24 MED ORDER — ACETAMINOPHEN 325 MG PO TABS
650.0000 mg | ORAL_TABLET | Freq: Once | ORAL | Status: AC
  Administered 2013-10-24: 650 mg via ORAL
  Filled 2013-10-24: qty 2

## 2013-10-24 MED ORDER — VANCOMYCIN HCL IN DEXTROSE 1-5 GM/200ML-% IV SOLN
1000.0000 mg | Freq: Once | INTRAVENOUS | Status: AC
  Administered 2013-10-24: 1000 mg via INTRAVENOUS
  Filled 2013-10-24: qty 200

## 2013-10-24 MED ORDER — VANCOMYCIN HCL IN DEXTROSE 1-5 GM/200ML-% IV SOLN
1000.0000 mg | INTRAVENOUS | Status: DC
  Administered 2013-10-25 – 2013-10-26 (×2): 1000 mg via INTRAVENOUS
  Filled 2013-10-24 (×3): qty 200

## 2013-10-24 MED ORDER — IOHEXOL 300 MG/ML  SOLN
80.0000 mL | Freq: Once | INTRAMUSCULAR | Status: AC | PRN
  Administered 2013-10-24: 80 mL via INTRAVENOUS

## 2013-10-24 MED ORDER — SODIUM CHLORIDE 0.9 % IV SOLN
1000.0000 mL | INTRAVENOUS | Status: DC
  Administered 2013-10-24: 1000 mL via INTRAVENOUS

## 2013-10-24 MED ORDER — PIPERACILLIN-TAZOBACTAM 3.375 G IVPB
3.3750 g | Freq: Three times a day (TID) | INTRAVENOUS | Status: DC
  Administered 2013-10-25 – 2013-11-06 (×34): 3.375 g via INTRAVENOUS
  Filled 2013-10-24 (×42): qty 50

## 2013-10-24 NOTE — Consult Note (Signed)
PHARMACY NOTE  Pharmacy Consult for :  Vancomycin, Zosyn Indication:  Sepsis  Hospital Problems Active Problems:   Sepsis  Past Medical History  Diagnosis Date  . Hypothyroidism   . Hyperlipemia   . Seasonal allergies     Weight: 46.7 kg  Vitals: BP 127/71  Pulse 112  Temp(Src) 102.3 F (39.1 C) (Rectal)  Resp 20  Ht 5' 2.6" (1.59 m)  Wt 102 lb 15.3 oz (46.7 kg)  BMI 18.47 kg/m2  SpO2 96%  Labs:  Recent Labs  10/24/13 2055  WBC 15.7*  HGB 10.9*  PLT 368   Estimated Creatinine Clearance: 44.1 ml/min (by C-G formula based on Cr of 0.5).   Microbiology: Recent Results (from the past 720 hour(s))  CULTURE, ROUTINE-ABSCESS     Status: None   Collection Time    10/02/13 11:43 AM      Result Value Range Status   Specimen Description ABSCESS   Final   Special Requests PELVIC CUL DE SAC   Final   Gram Stain     Final   Value: FEW WBC PRESENT, PREDOMINANTLY PMN     NO SQUAMOUS EPITHELIAL CELLS SEEN     FEW GRAM NEGATIVE RODS     Performed at [facility]   Culture     Final   Value: MODERATE ESCHERICHIA COLI     Performed at [facility]   Report Status 10/04/2013 FINAL   Final   Organism ID, Bacteria ESCHERICHIA COLI   Final  ANAEROBIC CULTURE     Status: None   Collection Time    10/02/13 11:43 AM      Result Value Range Status   Specimen Description ABSCESS   Final   Special Requests PELVIC CUL DE SAC   Final   Gram Stain     Final   Value: FEW WBC PRESENT, PREDOMINANTLY PMN     NO SQUAMOUS EPITHELIAL CELLS SEEN     FEW GRAM NEGATIVE RODS     Performed at [facility]   Culture     Final   Value: NO ANAEROBES ISOLATED     Performed at [facility]   Report Status 10/07/2013 FINAL   Final  CLOSTRIDIUM DIFFICILE BY PCR     Status: None   Collection Time    10/08/13  1:36 PM      Result Value Range Status   C difficile by pcr NEGATIVE  NEGATIVE Final    Anti-infectives Anti-infectives   Start      Dose/Rate Route Frequency Ordered Stop   10/24/13 2200  piperacillin-tazobactam (ZOSYN) IVPB 3.375 g     3.375 g 100 mL/hr over 30 Minutes Intravenous  Once 10/24/13 2156     10/24/13 2200  vancomycin (VANCOCIN) IVPB 1000 mg/200 mL premix     1,000 mg 200 mL/hr over 60 Minutes Intravenous  Once 10/24/13 2156        Assessment:  78 y/o female with PMH of HTN, HLP, Hypothyroid, anxiety and recent hospitalization for multiple pelvic abscesses is admitted with Sepsis.  Patient is to be placed on Vancomycin and Zosyn for sepsis coverage.  CrCl 44 ml/min.  WBC 15.7, Febrile  >  102.3 F (39.1 C) (Rectal)  Goal of Therapy:   Vancomycin trough level 15-20 mcg/ml Antibiotics selected for infection/cultures and adjusted for renal function.   Plan:  Continue Zosyn 3.375 gm IV q 8 hours, each subsequent dose to infuse over 4 hours Continue Vancomycin 1 gm IV  q 24 hours.  Follow up SCr, UOP, cultures, clinical course and adjust as clinically indicated.   Laurena Bering, Pharm.D.  10/24/2013 10:12 PM

## 2013-10-24 NOTE — ED Notes (Signed)
Per EMS: Patient's husband called out due to weakness. Pt states she had cysts removed from her abdomen several months ago. Pt states she went to stand up out of bed, felt weak, and had to ease herself down to the floor. Reports she has had similar weakness for several months. Pt reports she is suppose to have her Gallbladder removed as soon as she is healthy enough to have surgery. Pt denies pain or new injury tonight. Patient Ax4, NAD.

## 2013-10-24 NOTE — ED Provider Notes (Signed)
CSN: [id number]     Arrival date & time 10/24/13  2030 History   First MD Initiated Contact with Patient 10/24/13 2039     Chief Complaint  Patient presents with  . Weakness   (Consider location/radiation/quality/duration/timing/severity/associated sxs/prior Treatment) HPI Onset was today, constant. Pt states she went to stand up out of bed, felt weak, and had to ease herself down to the floor. Reports she has had similar weakness for several months.  Described as a moderately severe problem. Modifying factors: weakness worse when attempting to stand.  Associated symptoms: no cough, no dysuria.  Recent medical care: recently d/c'd from hospital about two weeks ago for abdominal and pelvic abscesses that resolved with antibiotics.   Past Medical History  Diagnosis Date  . Hypothyroidism   . Hyperlipemia   . Seasonal allergies    No past surgical history on file. No family history on file. History  Substance Use Topics  . Smoking status: Current Every Day Smoker -- 0.25 packs/day for 30 years    Types: Cigarettes  . Smokeless tobacco: Not on file     Comment: smokes 6-8 cigarettes/day, does not want to quit, no need for nicotine patch either  . Alcohol Use: 1.2 oz/week    2 Glasses of wine per week     Comment: 2 glasses a wine a night   OB History   Grav Para Term Preterm Abortions TAB SAB Ect Mult Living                 Review of Systems Constitutional: Negative for fever.  Eyes: Negative for vision loss.  ENT: Negative for difficulty swallowing.  Cardiovascular: Negative for chest pain. Respiratory: Negative for respiratory distress.  Gastrointestinal:  Negative for vomiting.  Genitourinary: Negative for inability to void.  Musculoskeletal: Negative for gait problem.  Integumentary: Negative for rash.  Neurological: Negative for new focal weakness.     Allergies  Cymbalta and Halcion  Home Medications   Current Outpatient Rx  Name  Route  Sig  Dispense  Refill   . busPIRone (BUSPAR) 10 MG tablet   Oral   Take 10 mg by mouth 2 (two) times daily.         . ciprofloxacin (CIPRO) 500 MG tablet   Oral   Take 1 tablet (500 mg total) by mouth 2 (two) times daily.   12 tablet   0   . ezetimibe-simvastatin (VYTORIN) 10-80 MG per tablet   Oral   Take 1 tablet by mouth at bedtime.         . feeding supplement, ENSURE COMPLETE, (ENSURE COMPLETE) LIQD   Oral   Take 237 mLs by mouth 2 (two) times daily between meals.   60 Bottle   12   . levothyroxine (SYNTHROID, LEVOTHROID) 75 MCG tablet   Oral   Take 75 mcg by mouth daily before breakfast.         . loratadine (CLARITIN) 10 MG tablet   Oral   Take 10 mg by mouth daily.         Marland Kitchen losartan (COZAAR) 50 MG tablet   Oral   Take 50 mg by mouth daily.          . Multiple Vitamin (MULTIVITAMIN WITH MINERALS) TABS tablet   Oral   Take 1 tablet by mouth daily.   100 tablet   12   . potassium chloride (KLOR-CON) 20 MEQ packet   Oral   Take 20 mEq by mouth daily.         Marland Kitchen  promethazine (PHENERGAN) 25 MG tablet   Oral   Take 25 mg by mouth every 6 (six) hours as needed for nausea or vomiting.         . zaleplon (SONATA) 10 MG capsule   Oral   Take 10 mg by mouth at bedtime.          BP 123/72  Pulse 88  Temp(Src) 102.3 F (39.1 C) (Rectal)  Resp 13  Ht 5' 2.6" (1.59 m)  Wt 102 lb 15.3 oz (46.7 kg)  BMI 18.47 kg/m2  SpO2 97% Physical Exam Nursing note and vitals reviewed.  Constitutional: Pt is alert and appears stated age.  Eyes: No injection, no scleral icterus. HENT: Atraumatic, airway open without erythema or exudate.  Respiratory: No respiratory distress. Equal breathing bilaterally. Cardiovascular: Tachycardic rate. Extremities warm and well perfused.  Abdomen: Soft, non-distended, mild diffuse abdominal tenderness. MSK: Extremities are atraumatic without deformity. Skin: No rash, no wounds.  Warm to touch.  Neuro: No motor nor sensory deficit. GCS 15.       ED Course  Procedures (including critical care time) Labs Review Labs Reviewed  CBC WITH DIFFERENTIAL - Abnormal; Notable for the following:    WBC 15.7 (*)    RBC 3.42 (*)    Hemoglobin 10.9 (*)    HCT 31.7 (*)    Neutrophils Relative % 78 (*)    Neutro Abs 12.3 (*)    Lymphocytes Relative 11 (*)    Monocytes Absolute 1.7 (*)    All other components within normal limits  COMPREHENSIVE METABOLIC PANEL - Abnormal; Notable for the following:    Sodium 133 (*)    Potassium 3.2 (*)    Glucose, Bld 131 (*)    Albumin 2.7 (*)    AST 46 (*)    ALT 78 (*)    Alkaline Phosphatase 403 (*)    GFR calc non Af Amer 88 (*)    All other components within normal limits  URINALYSIS, ROUTINE W REFLEX MICROSCOPIC - Abnormal; Notable for the following:    APPearance CLOUDY (*)    Leukocytes, UA MODERATE (*)    All other components within normal limits  URINE MICROSCOPIC-ADD ON - Abnormal; Notable for the following:    Squamous Epithelial / LPF MANY (*)    All other components within normal limits  CULTURE, BLOOD (ROUTINE X 2)  CULTURE, BLOOD (ROUTINE X 2)  URINE CULTURE  LACTIC ACID, PLASMA  COMPREHENSIVE METABOLIC PANEL  CBC   Imaging Review Dg Chest 2 View  10/24/2013   CLINICAL DATA:  Weakness for 3-4 weeks, smoking history  EXAM: CHEST  2 VIEW  COMPARISON:  None.  FINDINGS: The heart size and mediastinal contours are normal. Vascular pattern is normal. Lungs are clear except for mild scarring or atelectasis in the left lung base. No pleural effusion.  IMPRESSION: No acute abnormalities.   Electronically Signed   By: Esperanza Heir M.D.   On: 10/24/2013 21:46   Ct Abdomen Pelvis W Contrast  10/25/2013   CLINICAL DATA:  Weakness.  EXAM: CT ABDOMEN AND PELVIS WITH CONTRAST  TECHNIQUE: Multidetector CT imaging of the abdomen and pelvis was performed using the standard protocol following bolus administration of intravenous contrast.  CONTRAST:  80mL OMNIPAQUE IOHEXOL 300 MG/ML  SOLN   COMPARISON:  CT of the abdomen and pelvis performed 10/07/2013  FINDINGS: Mild bibasilar atelectasis is noted. There is mild bronchiectasis at the lower lung lobes.  Scattered hepatic cysts are seen, measuring up to  2.6 cm in size. These are grossly unchanged from the recent prior study. The spleen is unremarkable in appearance. There is persistent chronic inflammation of the gallbladder, with stones seen filling the gallbladder. The gallbladder wall currently measures 1.1 cm in diameter, slightly improved from the prior study, though surrounding soft tissue inflammation is noted. The pancreas and adrenal glands are unremarkable in appearance.  The kidneys are unremarkable in appearance. There is no evidence of hydronephrosis. No renal or ureteral stones are seen. No perinephric stranding is appreciated.  No significant free fluid is seen within the abdomen or pelvis. The visualized small bowel is unremarkable in appearance. The stomach is within normal limits. Scattered calcification is noted along the abdominal aorta and its branches. No acute vascular abnormalities are seen.  The appendix is not definitely characterized; there is no evidence for appendicitis. Minimal diverticulosis is noted along the distal descending and sigmoid colon, without evidence of diverticulitis. The colon is otherwise unremarkable in appearance.  The bladder is mildly distended and grossly unremarkable in appearance. The uterus is within normal limits. The previously noted dominant pelvic abscess appears to have resolved; a smaller collection of fluid at the right hemipelvis could simply reflect cysts within the right ovary. Mild chronic soft tissue inflammation is noted within the upper pelvis. No suspicious adnexal masses are seen. No inguinal lymphadenopathy is appreciated.  No acute osseus abnormalities are identified.  IMPRESSION: 1. No acute abnormality seen to explain the patient's symptoms. 2. Persistent chronic inflammation of  the gallbladder, with stones filling the gallbladder and marked gallbladder wall thickening to 1.1 cm. Surrounding soft tissue inflammation noted. Findings compatible with persistent chronic cholecystitis. 3. Dominant pelvic abscess appears to have resolved; smaller collection of fluid at the right hemipelvis could simply reflect cysts within the right ovary. Mild chronic residual soft tissue inflammation noted within the upper pelvis. 4. Scattered hepatic cysts noted. 5. Scattered calcification along the abdominal aorta and its branches. 6. Minimal diverticulosis along the distal descending and sigmoid colon, without evidence of diverticulitis. 7. Mild bibasilar atelectasis noted; mild bronchiectasis at the lower lung lobes.   Electronically Signed   By: Roanna Raider M.D.   On: 10/25/2013 00:00    EKG Interpretation     Ventricular Rate:  117 PR Interval:  144 QRS Duration: 77 QT Interval:  326 QTC Calculation: 455 R Axis:   164 Text Interpretation:  Sinus or ectopic atrial tachycardia Low voltage, precordial leads Right ventricular hypertrophy Abnrm T, consider ischemia, anterolateral lds            MDM   1. SIRS (systemic inflammatory response syndrome)   2. Weakness    78 y.o. female presents w/ weakness. Pt awake, alert, warm to touch. Rectal temp elevated. Chart reviewed including recent d/c summary. Concerned for intra abdominal source. Given IV zosyn, IV vanc, IVF bolus. Ordered CT scan.   Labs with low lactate. CMP with elevated Alk phos, AST/ALT, normal kidney function. CBC without leukocytosis. Pt doing better on re-eval. Pt and family updated. Called surgery and medicine who came to see patient. Reviewed CT scan and report. Pt admitted to medicine.      I independently viewed, interpreted, and used in my medical decision making all ordered lab and imaging tests. Medical Decision Making discussed with ED attending Darlys Gales, MD      Charm Barges, MD 10/25/13  0111

## 2013-10-24 NOTE — ED Notes (Signed)
Patient transported to XR. 

## 2013-10-24 NOTE — Consult Note (Signed)
Reason for Consult: weakness Referring Physician: Dr. Derenda Mis [name] is an 78 y.o. female.  HPI: the patient is a 78 year old female who was recently hospitalized secondary to weakness and diverticular abscess as well as chronic cholecystitis. Patient underwent drain placement which helped resolve her diverticular abscess.   The patient underwent MRI to evaluate her gallbladder as well as cancer and his studies. The conclusion was that she had chronic cholecystitis. The plan at that time was to have her follow up as an outpatient secondary to her poor nutritional status for outpatient cholecystectomy.  Since being discharged from the hospital the patient had minimal output has been on an Ensure diet.  The last 24 hours patient had some weakness as well as some nausea and minimal emesis. There also seems to be some mental status change that has corrected.  Past Medical History  Diagnosis Date  . Hypothyroidism   . Hyperlipemia   . Seasonal allergies     No past surgical history on file.  No family history on file.  Social History:  reports that she has been smoking Cigarettes.  She has a 7.5 pack-year smoking history. She does not have any smokeless tobacco history on file. She reports that she drinks about 1.2 ounces of alcohol per week. She reports that she does not use illicit drugs.  Allergies:  Allergies  Allergen Reactions  . Cymbalta [Duloxetine Hcl] Other (See Comments)    "kinda went nuts"  . Halcion [Triazolam] Other (See Comments)    "kinda went nuts"    Medications: I have reviewed the patient's current medications.  Results for orders placed during the hospital encounter of 10/24/13 (from the past 48 hour(s))  CBC WITH DIFFERENTIAL     Status: Abnormal   Collection Time    10/24/13  8:55 PM      Result Value Range   WBC 15.7 (*) 4.0 - 10.5 K/uL   RBC 3.42 (*) 3.87 - 5.11 MIL/uL   Hemoglobin 10.9 (*) 12.0 - 15.0 g/dL   HCT 16.1 (*) 09.6 - 04.5 %   MCV  92.7  78.0 - 100.0 fL   MCH 31.9  26.0 - 34.0 pg   MCHC 34.4  30.0 - 36.0 g/dL   RDW 40.9  81.1 - 91.4 %   Platelets 368  150 - 400 K/uL   Neutrophils Relative % 78 (*) 43 - 77 %   Neutro Abs 12.3 (*) 1.7 - 7.7 K/uL   Lymphocytes Relative 11 (*) 12 - 46 %   Lymphs Abs 1.7  0.7 - 4.0 K/uL   Monocytes Relative 11  3 - 12 %   Monocytes Absolute 1.7 (*) 0.1 - 1.0 K/uL   Eosinophils Relative 0  0 - 5 %   Eosinophils Absolute 0.0  0.0 - 0.7 K/uL   Basophils Relative 0  0 - 1 %   Basophils Absolute 0.0  0.0 - 0.1 K/uL  COMPREHENSIVE METABOLIC PANEL     Status: Abnormal   Collection Time    10/24/13  8:55 PM      Result Value Range   Sodium 133 (*) 135 - 145 mEq/L   Potassium 3.2 (*) 3.5 - 5.1 mEq/L   Chloride 96  96 - 112 mEq/L   CO2 24  19 - 32 mEq/L   Glucose, Bld 131 (*) 70 - 99 mg/dL   BUN 10  6 - 23 mg/dL   Creatinine, Ser 7.82  0.50 - 1.10 mg/dL   Calcium  8.9  8.4 - 10.5 mg/dL   Total Protein 6.8  6.0 - 8.3 g/dL   Albumin 2.7 (*) 3.5 - 5.2 g/dL   AST 46 (*) 0 - 37 U/L   ALT 78 (*) 0 - 35 U/L   Alkaline Phosphatase 403 (*) 39 - 117 U/L   Total Bilirubin 0.7  0.3 - 1.2 mg/dL   GFR calc non Af Amer 88 (*) >90 mL/min   GFR calc Af Amer >90  >90 mL/min   Comment: (NOTE)     The eGFR has been calculated using the CKD EPI equation.     This calculation has not been validated in all clinical situations.     eGFR's persistently <90 mL/min signify possible Chronic Kidney     Disease.  LACTIC ACID, PLASMA     Status: None   Collection Time    10/24/13  8:55 PM      Result Value Range   Lactic Acid, Venous 1.2  0.5 - 2.2 mmol/L    Dg Chest 2 View  10/24/2013   CLINICAL DATA:  Weakness for 3-4 weeks, smoking history  EXAM: CHEST  2 VIEW  COMPARISON:  None.  FINDINGS: The heart size and mediastinal contours are normal. Vascular pattern is normal. Lungs are clear except for mild scarring or atelectasis in the left lung base. No pleural effusion.  IMPRESSION: No acute abnormalities.    Electronically Signed   By: Esperanza Heir M.D.   On: 10/24/2013 21:46    Review of Systems  Constitutional: Negative.   HENT: Negative.   Respiratory: Negative.   Cardiovascular: Negative.   Gastrointestinal: Positive for nausea and vomiting. Negative for abdominal pain.  Musculoskeletal: Negative.   Skin: Negative.   Neurological: Negative.   All other systems reviewed and are negative.   Blood pressure 115/55, pulse 91, temperature 102.3 F (39.1 C), temperature source Rectal, resp. rate 20, height 5' 2.6" (1.59 m), weight 102 lb 15.3 oz (46.7 kg), SpO2 97.00%. Physical Exam  Vitals reviewed. Constitutional: She is oriented to person, place, and time. She appears well-developed and well-nourished.  HENT:  Head: Normocephalic and atraumatic.  Eyes: Conjunctivae and EOM are normal. Pupils are equal, round, and reactive to light.  Neck: Normal range of motion. Neck supple.  Cardiovascular: Normal rate, regular rhythm and normal heart sounds.   Respiratory: Effort normal and breath sounds normal.  GI: Soft. Bowel sounds are normal. She exhibits no distension and no mass. There is no tenderness. There is no rebound and no guarding.  Musculoskeletal: Normal range of motion.  Neurological: She is alert and oriented to person, place, and time.  Skin: Skin is warm and dry.    Assessment/Plan: 78 year old female with chronic cholecystitis, and malnutrition  1. I agree with continued antibiotics at this time. 2. I will discuss the patient in the morning with Dr. Luisa Hart who is her Surgicalist for the week, in regards to proceeding with a lap versus open cholecystectomy during this hospital stay. 3. Discussed this plan with the patient's husband and the patient in which they agreed.  [name]., [name] 10/24/2013, 11:48 PM

## 2013-10-25 ENCOUNTER — Encounter (HOSPITAL_COMMUNITY): Payer: Self-pay | Admitting: *Deleted

## 2013-10-25 LAB — URINALYSIS, ROUTINE W REFLEX MICROSCOPIC
Bilirubin Urine: NEGATIVE
Glucose, UA: NEGATIVE mg/dL
Hgb urine dipstick: NEGATIVE
Ketones, ur: NEGATIVE mg/dL
Nitrite: NEGATIVE
Protein, ur: NEGATIVE mg/dL
Specific Gravity, Urine: 1.028 (ref 1.005–1.030)
Urobilinogen, UA: 0.2 mg/dL (ref 0.0–1.0)
pH: 7.5 (ref 5.0–8.0)

## 2013-10-25 LAB — COMPREHENSIVE METABOLIC PANEL
ALT: 54 U/L — ABNORMAL HIGH (ref 0–35)
AST: 29 U/L (ref 0–37)
Albumin: 2.2 g/dL — ABNORMAL LOW (ref 3.5–5.2)
Alkaline Phosphatase: 307 U/L — ABNORMAL HIGH (ref 39–117)
BUN: 6 mg/dL (ref 6–23)
CO2: 24 mEq/L (ref 19–32)
Calcium: 8.5 mg/dL (ref 8.4–10.5)
Chloride: 105 mEq/L (ref 96–112)
Creatinine, Ser: 0.6 mg/dL (ref 0.50–1.10)
GFR calc Af Amer: >90 mL/min (ref >90)
GFR calc non Af Amer: 86 mL/min — ABNORMAL LOW (ref >90)
Glucose, Bld: 107 mg/dL — ABNORMAL HIGH (ref 70–99)
Potassium: 3.4 mEq/L — ABNORMAL LOW (ref 3.5–5.1)
Sodium: 138 mEq/L (ref 135–145)
Total Bilirubin: 0.6 mg/dL (ref 0.3–1.2)
Total Protein: 5.8 g/dL — ABNORMAL LOW (ref 6.0–8.3)

## 2013-10-25 LAB — CBC
HCT: 28.2 % — ABNORMAL LOW (ref 36.0–46.0)
Hemoglobin: 9.5 g/dL — ABNORMAL LOW (ref 12.0–15.0)
MCH: 31.6 pg (ref 26.0–34.0)
MCHC: 33.7 g/dL (ref 30.0–36.0)
MCV: 93.7 fL (ref 78.0–100.0)
Platelets: 305 10*3/uL (ref 150–400)
RBC: 3.01 MIL/uL — ABNORMAL LOW (ref 3.87–5.11)
RDW: 14.5 % (ref 11.5–15.5)
WBC: 10.4 10*3/uL (ref 4.0–10.5)

## 2013-10-25 LAB — URINE MICROSCOPIC-ADD ON

## 2013-10-25 MED ORDER — POTASSIUM CHLORIDE CRYS ER 20 MEQ PO TBCR
20.0000 meq | EXTENDED_RELEASE_TABLET | Freq: Every day | ORAL | Status: DC
  Administered 2013-10-25: 20 meq via ORAL
  Filled 2013-10-25 (×4): qty 1

## 2013-10-25 MED ORDER — LOSARTAN POTASSIUM 50 MG PO TABS
50.0000 mg | ORAL_TABLET | Freq: Every day | ORAL | Status: DC
  Administered 2013-10-25: 50 mg via ORAL
  Filled 2013-10-25 (×2): qty 1

## 2013-10-25 MED ORDER — LEVOTHYROXINE SODIUM 75 MCG PO TABS
75.0000 ug | ORAL_TABLET | Freq: Every day | ORAL | Status: DC
  Administered 2013-10-25 – 2013-10-26 (×2): 75 ug via ORAL
  Filled 2013-10-25 (×4): qty 1

## 2013-10-25 MED ORDER — ONDANSETRON HCL 4 MG/2ML IJ SOLN: 4.0000 mg | Freq: Four times a day (QID) | INTRAMUSCULAR | Status: DC | PRN

## 2013-10-25 MED ORDER — POTASSIUM CHLORIDE IN NACL 20-0.9 MEQ/L-% IV SOLN
INTRAVENOUS | Status: AC
  Administered 2013-10-25: 125 mL/h via INTRAVENOUS
  Administered 2013-10-25 – 2013-10-26 (×2): via INTRAVENOUS
  Administered 2013-10-27: 125 mL via INTRAVENOUS
  Administered 2013-10-27: 16:00:00 via INTRAVENOUS
  Filled 2013-10-25 (×10): qty 1000

## 2013-10-25 MED ORDER — DEXTROSE 5 % IV SOLN
3.0000 g | INTRAVENOUS | Status: AC
  Filled 2013-10-25: qty 3000

## 2013-10-25 MED ORDER — ENSURE COMPLETE PO LIQD
237.0000 mL | Freq: Four times a day (QID) | ORAL | Status: DC
  Administered 2013-10-25 (×2): 237 mL via ORAL

## 2013-10-25 MED ORDER — OXYCODONE HCL 5 MG PO TABS: 5.0000 mg | ORAL_TABLET | ORAL | Status: DC | PRN

## 2013-10-25 MED ORDER — EZETIMIBE-SIMVASTATIN 10-80 MG PO TABS
1.0000 | ORAL_TABLET | Freq: Every day | ORAL | Status: DC
  Administered 2013-10-25: 1 via ORAL
  Filled 2013-10-25 (×2): qty 1

## 2013-10-25 MED ORDER — POTASSIUM CHLORIDE 20 MEQ/15ML (10%) PO LIQD
20.0000 meq | Freq: Every day | ORAL | Status: DC
  Administered 2013-10-25: 20 meq via ORAL
  Filled 2013-10-25 (×2): qty 15

## 2013-10-25 MED ORDER — ZOLPIDEM TARTRATE 5 MG PO TABS
5.0000 mg | ORAL_TABLET | Freq: Every evening | ORAL | Status: DC | PRN
  Administered 2013-10-25 – 2013-10-27 (×3): 5 mg via ORAL
  Filled 2013-10-25 (×3): qty 1

## 2013-10-25 MED ORDER — ENSURE COMPLETE PO LIQD: 237.0000 mL | Freq: Two times a day (BID) | ORAL | Status: DC

## 2013-10-25 MED ORDER — BUSPIRONE HCL 10 MG PO TABS
10.0000 mg | ORAL_TABLET | Freq: Two times a day (BID) | ORAL | Status: DC
  Administered 2013-10-25 (×3): 10 mg via ORAL
  Filled 2013-10-25 (×5): qty 1

## 2013-10-25 MED ORDER — POLYETHYLENE GLYCOL 3350 17 G PO PACK
17.0000 g | PACK | Freq: Every day | ORAL | Status: DC | PRN
  Filled 2013-10-25: qty 1

## 2013-10-25 MED ORDER — ONDANSETRON HCL 4 MG PO TABS: 4.0000 mg | ORAL_TABLET | Freq: Four times a day (QID) | ORAL | Status: DC | PRN

## 2013-10-25 MED ORDER — LORATADINE 10 MG PO TABS
10.0000 mg | ORAL_TABLET | Freq: Every day | ORAL | Status: DC
  Administered 2013-10-25: 10 mg via ORAL
  Filled 2013-10-25 (×2): qty 1

## 2013-10-25 MED ORDER — ENOXAPARIN SODIUM 30 MG/0.3ML ~~LOC~~ SOLN
30.0000 mg | SUBCUTANEOUS | Status: DC
  Administered 2013-10-25 – 2013-10-27 (×2): 30 mg via SUBCUTANEOUS
  Filled 2013-10-25 (×4): qty 0.3

## 2013-10-25 MED ORDER — POTASSIUM CHLORIDE 20 MEQ PO PACK: 20.0000 meq | PACK | Freq: Every day | ORAL | Status: DC

## 2013-10-25 NOTE — ED Provider Notes (Signed)
I saw and evaluated the patient, reviewed the resident's note and I agree with the findings and plan.  EKG Interpretation     Ventricular Rate:  117 PR Interval:  144 QRS Duration: 77 QT Interval:  326 QTC Calculation: 455 R Axis:   164 Text Interpretation:  Sinus or ectopic atrial tachycardia Low voltage, precordial leads Right ventricular hypertrophy Abnrm T, consider ischemia, anterolateral lds            Admitted for SIRS.  Febrile.  ABx given.  VSS in ER.  Admit medicine.   Darlys Gales, MD 10/25/13 [phone]

## 2013-10-25 NOTE — Progress Notes (Signed)
Subjective: Had a pretty good night. No more N/V. No abd pain No more fever after presentation  Objective: Vital signs in last 24 hours: Temp:  [97.6 F (36.4 C)-102.3 F (39.1 C)] 97.6 F (36.4 C) (11/10 0544) Pulse Rate:  [76-119] 76 (11/10 0544) Resp:  [13-29] 20 (11/10 0544) BP: (103-128)/(55-72) 103/57 mmHg (11/10 0544) SpO2:  [95 %-97 %] 97 % (11/10 0544) Weight:  [42.3 kg (93 lb 4.1 oz)-46.7 kg (102 lb 15.3 oz)] 42.3 kg (93 lb 4.1 oz) (11/10 0114)  Intake/Output from previous day: 11/09 0701 - 11/10 0700 In: 500 [I.V.:500] Out: -  Intake/Output this shift:    Lying flat in no distress. Face symmetric. Oral membranes moist abd soft no tenderness. Reduced but present Bowel sounds Awake. Doesn't seem very confused to me  Lab Results   Recent Labs  10/24/13 2055 10/25/13 0620  WBC 15.7* 10.4  RBC 3.42* 3.01*  HGB 10.9* 9.5*  HCT 31.7* 28.2*  MCV 92.7 93.7  MCH 31.9 31.6  RDW 14.5 14.5  PLT 368 305    Recent Labs  10/24/13 2055 10/25/13 0620  NA 133* 138  K 3.2* 3.4*  CL 96 105  CO2 24 24  GLUCOSE 131* 107*  BUN 10 6  CREATININE 0.57 0.60  CALCIUM 8.9 8.5    Studies/Results: Dg Chest 2 View  10/24/2013   CLINICAL DATA:  Weakness for 3-4 weeks, smoking history  EXAM: CHEST  2 VIEW  COMPARISON:  None.  FINDINGS: The heart size and mediastinal contours are normal. Vascular pattern is normal. Lungs are clear except for mild scarring or atelectasis in the left lung base. No pleural effusion.  IMPRESSION: No acute abnormalities.   Electronically Signed   By: Esperanza Heir M.D.   On: 10/24/2013 21:46   Ct Abdomen Pelvis W Contrast  10/25/2013   CLINICAL DATA:  Weakness.  EXAM: CT ABDOMEN AND PELVIS WITH CONTRAST  TECHNIQUE: Multidetector CT imaging of the abdomen and pelvis was performed using the standard protocol following bolus administration of intravenous contrast.  CONTRAST:  80mL OMNIPAQUE IOHEXOL 300 MG/ML  SOLN  COMPARISON:  CT of the abdomen and  pelvis performed 10/07/2013  FINDINGS: Mild bibasilar atelectasis is noted. There is mild bronchiectasis at the lower lung lobes.  Scattered hepatic cysts are seen, measuring up to 2.6 cm in size. These are grossly unchanged from the recent prior study. The spleen is unremarkable in appearance. There is persistent chronic inflammation of the gallbladder, with stones seen filling the gallbladder. The gallbladder wall currently measures 1.1 cm in diameter, slightly improved from the prior study, though surrounding soft tissue inflammation is noted. The pancreas and adrenal glands are unremarkable in appearance.  The kidneys are unremarkable in appearance. There is no evidence of hydronephrosis. No renal or ureteral stones are seen. No perinephric stranding is appreciated.  No significant free fluid is seen within the abdomen or pelvis. The visualized small bowel is unremarkable in appearance. The stomach is within normal limits. Scattered calcification is noted along the abdominal aorta and its branches. No acute vascular abnormalities are seen.  The appendix is not definitely characterized; there is no evidence for appendicitis. Minimal diverticulosis is noted along the distal descending and sigmoid colon, without evidence of diverticulitis. The colon is otherwise unremarkable in appearance.  The bladder is mildly distended and grossly unremarkable in appearance. The uterus is within normal limits. The previously noted dominant pelvic abscess appears to have resolved; a smaller collection of fluid at the right  hemipelvis could simply reflect cysts within the right ovary. Mild chronic soft tissue inflammation is noted within the upper pelvis. No suspicious adnexal masses are seen. No inguinal lymphadenopathy is appreciated.  No acute osseus abnormalities are identified.  IMPRESSION: 1. No acute abnormality seen to explain the patient's symptoms. 2. Persistent chronic inflammation of the gallbladder, with stones  filling the gallbladder and marked gallbladder wall thickening to 1.1 cm. Surrounding soft tissue inflammation noted. Findings compatible with persistent chronic cholecystitis. 3. Dominant pelvic abscess appears to have resolved; smaller collection of fluid at the right hemipelvis could simply reflect cysts within the right ovary. Mild chronic residual soft tissue inflammation noted within the upper pelvis. 4. Scattered hepatic cysts noted. 5. Scattered calcification along the abdominal aorta and its branches. 6. Minimal diverticulosis along the distal descending and sigmoid colon, without evidence of diverticulitis. 7. Mild bibasilar atelectasis noted; mild bronchiectasis at the lower lung lobes.   Electronically Signed   By: Roanna Raider M.D.   On: 10/25/2013 00:00    Scheduled Meds: . busPIRone  10 mg Oral BID  . enoxaparin (LOVENOX) injection  30 mg Subcutaneous Q24H  . ezetimibe-simvastatin  1 tablet Oral QHS  . feeding supplement (ENSURE COMPLETE)  237 mL Oral BID BM  . levothyroxine  75 mcg Oral QAC breakfast  . loratadine  10 mg Oral Daily  . losartan  50 mg Oral Daily  . piperacillin-tazobactam  3.375 g Intravenous Q8H  . potassium chloride SA  20 mEq Oral Daily  . vancomycin  1,000 mg Intravenous Q24H   Continuous Infusions: . 0.9 % NaCl with KCl 20 mEq / L 125 mL/hr at 10/25/13 0150   PRN Meds:ondansetron (ZOFRAN) IV, ondansetron, oxyCODONE, polyethylene glycol, zolpidem  Assessment/Plan: CHRONIC CHOLECYSTITIS: for planned surgery tomorrow PELVIC ABSCESS: a bit of reaccumulation seen HYPOTHYROID: doing OK COPD: has still been smoking some. Watch carefully postop MALNUTRITION: we have gotten alb from 1.8 to 2.7    LOS: 1 day   Duffy Dantonio ALAN 10/25/2013, 8:47 AM

## 2013-10-25 NOTE — Progress Notes (Signed)
INITIAL NUTRITION ASSESSMENT  DOCUMENTATION CODES Per approved criteria  -Severe malnutrition in the context of chronic illness -Underweight   INTERVENTION: 1. Ensure Complete QID, 8 oz provides 350 kcal, 13 g protein.  2. Encourage adequate intake of meals.   NUTRITION DIAGNOSIS: Inadequate oral intake related to nausea as evidenced by minimal intake reported.   Goal: Patient will meet >/=90% of estimated nutrition needs  Monitor:  PO intake, weight, labs, I/Os  Reason for Assessment: Malnutrition screening tool  78 y.o. female  Admitting Dx: SIRS  ASSESSMENT: Patient with recent history of abdominal abscess and chronic cholecystitis. She has had worsening of nausea and some vomiting over the last few days with minimal oral intake besides Ensure shakes 4 times daily. She is scheduled for cholecystectomy tomorrow. She reports that she has not been eating much, but plans to eat at least some of lunch that is in room at the time of visit.   Patient with 10% weight loss in the last month and moderate-severe fat and muscle wasting.   Patient meets the criteria for severe MALNUTRITION in the context of chronic illness with 10% weight loss in 1 months, PO intake <75% of estimated needs, and moderate-severe muscle and fat tissue loss.   Nutrition Focused Physical Exam:  Subcutaneous Fat:  Orbital Region: Moderate Upper Arm Region: Severe Thoracic and Lumbar Region: Severe  Muscle:  Temple Region: Moderate Clavicle Bone Region: Moderate Clavicle and Acromion Bone Region: Severe Scapular Bone Region: Severe Dorsal Hand: Moderate Patellar Region: Severe Anterior Thigh Region: Severe Posterior Calf Region: Severe  Edema: WNL  Height: Ht Readings from Last 1 Encounters:  10/25/13 5\' 3"  (1.6 m)    Weight: Wt Readings from Last 1 Encounters:  10/25/13 93 lb 4.1 oz (42.3 kg)    Ideal Body Weight: 115 pounds  % Ideal Body Weight: 81%  Wt Readings from Last 10  Encounters:  10/25/13 93 lb 4.1 oz (42.3 kg)  10/01/13 103 lb (46.72 kg)    Usual Body Weight: 103 pounds  % Usual Body Weight: 90%  BMI:  Body mass index is 16.52 kg/(m^2). Patient is underweight.   Estimated Nutritional Needs: Kcal: 1300-1400 kcal Protein: 55-65 g Fluid: >1.3 L/day  Skin: Incision, buttocks  Diet Order: Dysphagia 3, thin  EDUCATION NEEDS: -No education needs identified at this time   Intake/Output Summary (Last 24 hours) at 10/25/13 1330 Last data filed at 10/24/13 2155  Gross per 24 hour  Intake    500 ml  Output      0 ml  Net    500 ml    Last BM: 11/10   Labs:   Recent Labs Lab 10/24/13 2055 10/25/13 0620  NA 133* 138  K 3.2* 3.4*  CL 96 105  CO2 24 24  BUN 10 6  CREATININE 0.57 0.60  CALCIUM 8.9 8.5  GLUCOSE 131* 107*    CBG (last 3)  No results found for this basename: GLUCAP,  in the last 72 hours  Scheduled Meds: . busPIRone  10 mg Oral BID  . enoxaparin (LOVENOX) injection  30 mg Subcutaneous Q24H  . ezetimibe-simvastatin  1 tablet Oral QHS  . feeding supplement (ENSURE COMPLETE)  237 mL Oral BID BM  . levothyroxine  75 mcg Oral QAC breakfast  . loratadine  10 mg Oral Daily  . losartan  50 mg Oral Daily  . piperacillin-tazobactam  3.375 g Intravenous Q8H  . potassium chloride  20 mEq Oral Daily  . potassium chloride SA  20 mEq Oral Daily  . vancomycin  1,000 mg Intravenous Q24H    Continuous Infusions: . 0.9 % NaCl with KCl 20 mEq / L 125 mL/hr at 10/25/13 0150    Past Medical History  Diagnosis Date  . Hypothyroidism   . Hyperlipemia   . Seasonal allergies     History reviewed. No pertinent past surgical history.  Linnell Fulling, RD, LDN Pager #: [phone] After-Hours Pager #: [phone]

## 2013-10-25 NOTE — H&P (Signed)
Physician Admission History and Physical     PCP:   Julian Hy, MD   Chief Complaint:  Nausea and weakness   HPI: [name] is an 78 y.o. female.  Pt has a recent hx of being treated w/ perc drain and IV abx for abdominal abscess that has now resolved per CT in the ED. She had been evaluated during this prior admission for chronic cholecystitis by the surgery team and a surgery was going to be scheduled in the future pending her clinical improval. She was doing ok at home after discharge, except her diet has hardly progressed beyond ensure shakes for nutrition. Over the past 2 days, her nausea has worsened in severity and she had emesis x 1 today. In addition, her weakness has progressed and she was found on the floor today by her husband and she was unsure how she got there. She was too weak to get back in bed on her own. She denies any fevers or chills. Daughter endorses some abd pain yesterday. ROS is negative other than weakness, nausea, emesis, and some altered mental status.   In the ED, she was found to be febrile w/ leukocytosis and tachycardia. Also has some hypokalemia and malnourishment that appears chronic. CT shows continued presence of chronic cholecystitis but no acute findings,and prior abscess shows resolution.   She had blood and urine cxs drawn, vanc/zosyn were initiated, IVF started and consult was placed to general surgery for consultation and [facility] for admission.   Review of Systems:  Neg except as stated above  Past Medical History: Past Medical History  Diagnosis Date  . Hypothyroidism   . Hyperlipemia   . Seasonal allergies    No past surgical history on file.  Medications: Prior to Admission medications   Medication Sig Start Date End Date Taking? Authorizing Provider  busPIRone (BUSPAR) 10 MG tablet Take 10 mg by mouth 2 (two) times daily.   Yes Historical Provider, MD  ciprofloxacin (CIPRO) 500 MG tablet Take 1 tablet (500 mg  total) by mouth 2 (two) times daily. 10/09/13  Yes Jarome Matin, MD  ezetimibe-simvastatin (VYTORIN) 10-80 MG per tablet Take 1 tablet by mouth at bedtime.   Yes Historical Provider, MD  feeding supplement, ENSURE COMPLETE, (ENSURE COMPLETE) LIQD Take 237 mLs by mouth 2 (two) times daily between meals. 10/09/13  Yes Jarome Matin, MD  levothyroxine (SYNTHROID, LEVOTHROID) 75 MCG tablet Take 75 mcg by mouth daily before breakfast.   Yes Historical Provider, MD  loratadine (CLARITIN) 10 MG tablet Take 10 mg by mouth daily.   Yes Historical Provider, MD  losartan (COZAAR) 50 MG tablet Take 50 mg by mouth daily.    Yes Historical Provider, MD  Multiple Vitamin (MULTIVITAMIN WITH MINERALS) TABS tablet Take 1 tablet by mouth daily. 10/09/13  Yes Jarome Matin, MD  potassium chloride (KLOR-CON) 20 MEQ packet Take 20 mEq by mouth daily.   Yes Historical Provider, MD  promethazine (PHENERGAN) 25 MG tablet Take 25 mg by mouth every 6 (six) hours as needed for nausea or vomiting.   Yes Historical Provider, MD  zaleplon (SONATA) 10 MG capsule Take 10 mg by mouth at bedtime.   Yes Historical Provider, MD    Allergies:   Allergies  Allergen Reactions  . Cymbalta [Duloxetine Hcl] Other (See Comments)    "kinda went nuts"  . Halcion [Triazolam] Other (See Comments)    "kinda went nuts"    Social History:  reports that she has been smoking Cigarettes.  She has a 7.5 pack-year smoking history. She does not have any smokeless tobacco history on file. She reports that she drinks about 1.2 ounces of alcohol per week. She reports that she does not use illicit drugs.  Family History: No family history on file.  Physical Exam: Filed Vitals:   10/24/13 2230 10/24/13 2300 10/24/13 2330 10/24/13 2345  BP: 124/64 123/65 115/55 111/55  Pulse: 98 94 91 90  Temp:      TempSrc:      Resp: 24 23 20 21   Height:      Weight:      SpO2: 97% 97% 97% 97%   Gen: frail elderly WF in NAD  HEENT: dry MM,  trachea midline  Lungs: CTAB, no rales or wheezes  Cardio: sinus tachy w/o MRG  Abd:  Soft, nontender, normal BS, no masses  Skin: warm dry  Ext: 2+ DP pulses. No pitting edema.     Labs on Admission:   Recent Labs  10/24/13 2055  NA 133*  K 3.2*  CL 96  CO2 24  GLUCOSE 131*  BUN 10  CREATININE 0.57  CALCIUM 8.9    Recent Labs  10/24/13 2055  AST 46*  ALT 78*  ALKPHOS 403*  BILITOT 0.7  PROT 6.8  ALBUMIN 2.7*   No results found for this basename: LIPASE, AMYLASE,  in the last 72 hours  Recent Labs  10/24/13 2055  WBC 15.7*  NEUTROABS 12.3*  HGB 10.9*  HCT 31.7*  MCV 92.7  PLT 368   No results found for this basename: CKTOTAL, CKMB, CKMBINDEX, TROPONINI,  in the last 72 hours No results found for this basename: INR, PROTIME   No results found for this basename: TSH, T4TOTAL, FREET3, T3FREE, THYROIDAB,  in the last 72 hours No results found for this basename: VITAMINB12, FOLATE, FERRITIN, TIBC, IRON, RETICCTPCT,  in the last 72 hours  Radiological Exams on Admission: Dg Chest 2 View  10/24/2013   CLINICAL DATA:  Weakness for 3-4 weeks, smoking history  EXAM: CHEST  2 VIEW  COMPARISON:  None.  FINDINGS: The heart size and mediastinal contours are normal. Vascular pattern is normal. Lungs are clear except for mild scarring or atelectasis in the left lung base. No pleural effusion.  IMPRESSION: No acute abnormalities.   Electronically Signed   By: Esperanza Heir M.D.   On: 10/24/2013 21:46   Ct Abdomen Pelvis W Contrast  10/25/2013   CLINICAL DATA:  Weakness.  EXAM: CT ABDOMEN AND PELVIS WITH CONTRAST  TECHNIQUE: Multidetector CT imaging of the abdomen and pelvis was performed using the standard protocol following bolus administration of intravenous contrast.  CONTRAST:  80mL OMNIPAQUE IOHEXOL 300 MG/ML  SOLN  COMPARISON:  CT of the abdomen and pelvis performed 10/07/2013  FINDINGS: Mild bibasilar atelectasis is noted. There is mild bronchiectasis at the lower  lung lobes.  Scattered hepatic cysts are seen, measuring up to 2.6 cm in size. These are grossly unchanged from the recent prior study. The spleen is unremarkable in appearance. There is persistent chronic inflammation of the gallbladder, with stones seen filling the gallbladder. The gallbladder wall currently measures 1.1 cm in diameter, slightly improved from the prior study, though surrounding soft tissue inflammation is noted. The pancreas and adrenal glands are unremarkable in appearance.  The kidneys are unremarkable in appearance. There is no evidence of hydronephrosis. No renal or ureteral stones are seen. No perinephric stranding is appreciated.  No significant free fluid is seen within the abdomen  or pelvis. The visualized small bowel is unremarkable in appearance. The stomach is within normal limits. Scattered calcification is noted along the abdominal aorta and its branches. No acute vascular abnormalities are seen.  The appendix is not definitely characterized; there is no evidence for appendicitis. Minimal diverticulosis is noted along the distal descending and sigmoid colon, without evidence of diverticulitis. The colon is otherwise unremarkable in appearance.  The bladder is mildly distended and grossly unremarkable in appearance. The uterus is within normal limits. The previously noted dominant pelvic abscess appears to have resolved; a smaller collection of fluid at the right hemipelvis could simply reflect cysts within the right ovary. Mild chronic soft tissue inflammation is noted within the upper pelvis. No suspicious adnexal masses are seen. No inguinal lymphadenopathy is appreciated.  No acute osseus abnormalities are identified.  IMPRESSION: 1. No acute abnormality seen to explain the patient's symptoms. 2. Persistent chronic inflammation of the gallbladder, with stones filling the gallbladder and marked gallbladder wall thickening to 1.1 cm. Surrounding soft tissue inflammation noted.  Findings compatible with persistent chronic cholecystitis. 3. Dominant pelvic abscess appears to have resolved; smaller collection of fluid at the right hemipelvis could simply reflect cysts within the right ovary. Mild chronic residual soft tissue inflammation noted within the upper pelvis. 4. Scattered hepatic cysts noted. 5. Scattered calcification along the abdominal aorta and its branches. 6. Minimal diverticulosis along the distal descending and sigmoid colon, without evidence of diverticulitis. 7. Mild bibasilar atelectasis noted; mild bronchiectasis at the lower lung lobes.   Electronically Signed   By: Roanna Raider M.D.   On: 10/25/2013 00:00   Ct Abdomen Pelvis W Contrast  10/07/2013   CLINICAL DATA:  Diverticular abscess, followup  EXAM: CT ABDOMEN AND PELVIS WITH CONTRAST  TECHNIQUE: Multidetector CT imaging of the abdomen and pelvis was performed using the standard protocol following bolus administration of intravenous contrast.  CONTRAST:  80mL OMNIPAQUE IOHEXOL 300 MG/ML  SOLN  COMPARISON:  MRI 810 07/17/2013, CT 10/01/2013  FINDINGS: Marked reduction of the pelvic abscess following percutaneous drain placement. A pigtail catheter is in the deep right pelvis with no measurable fluid collection where previously there was a 6.7 by 5.1 cm fluid collection. Small collection more superiorly centrally within the pelvis is also resolved and barely measurable at 5 mm compared to 30 mm on prior. Rounded lesion measuring 14 mm (image 60) is unchanged may represent ovarian cyst.  There is interval increase in bilateral pleural effusions and left basilar atelectasis compared to prior.  Multiple low-density lesions within the liver likely represent benign cysts. Gallbladder is again demonstrated to have extensive bowel wall inflammation with fatty infiltration measuring up to 15 mm. There is a large stone occupying the entire lumen of the gallbladder. This chronic cholecystitis pattern was evaluated on  recent MRI. There is mild intrahepatic biliary duct dilatation which is similar prior. The common bile duct is normal caliber. The pancreas, spleen, kidneys are normal. Nodule enlargement of the adrenal glands is stable.  The stomach, small bowel and colon are unchanged. There is mild dilatation of the small bowel similar 2 prior suggesting ileus. Bladder and uterus are normal. No aggressive osseous lesion  IMPRESSION: 1. Near complete resolution of pelvic abscess following percutaneous drainage catheter placement.  2. Mild small bowel ileus.  3.  Chronic calculus cholecystitis again demonstrated.  4. Interval increase in bilateral pleural effusions and basilar atelectasis.   Electronically Signed   By: Genevive Bi M.D.   On: 10/07/2013 12:38  Ct Abdomen Pelvis W Contrast  10/01/2013   CLINICAL DATA:  Mid abdominal pain and 10 lb weight loss.  BUN and creatinine were obtained on site at [facility] at [address]Results: BUN 8.0 mg/dL, Creatinine 0.6 mg/dL.  EXAM: CT ABDOMEN AND PELVIS WITH CONTRAST  TECHNIQUE: Multidetector CT imaging of the abdomen and pelvis was performed using the standard protocol following bolus administration of intravenous contrast.  CONTRAST:  OMNIPAQUE IOHEXOL 300 MG/ML SOLN, 40mL OMNIPAQUE IOHEXOL 300 MG/ML SOLN  COMPARISON:  03/14/2009.  FINDINGS: The lung bases are clear. No pleural effusion or pulmonary nodules. The heart is normal in size. No pericardial effusion.  The gallbladder is grossly abnormal. There are numerous small gallstones surrounded by a thickened gallbladder wall with cystic change. The gallbladder wall adjacent to the liver is somewhat irregular and on the liver windows there appears to be some infiltrative change in the liver. FINDINGS all are worrisome for gallbladder carcinoma with early liver INDICATION. There are multiple benign appearing hepatic cysts which are stable when compared to a prior CT scan. The common bile duct is  normal in caliber. The pancreas is normal. The spleen is normal. There is a small calcified splenic artery aneurysm noted. The adrenal glands are unremarkable. There is a stable left adrenal gland adenoma. The kidneys are normal.  The stomach, duodenum, small bowel and colon are grossly normal. Cannot definitely exclude involvement of the splenic flexure or 2nd portion of the duodenum on this examination. There are small scattered mesenteric and retroperitoneal lymph nodes but no overt adenopathy.  In the pelvis there are multiple pelvic abscesses. The largest abscesses in the deep right pelvis adjacent to the sigmoid colon and measures a maximum of 7 cm. Smaller abscesses are more anteriorly and just above the uterus. This is likely from perforated diverticulitis. The uterus and ovaries are grossly normal. The bladder is normal. No inguinal mass or adenopathy.  The bony structures are unremarkable. Moderate degenerative changes. There are smaller abscesses more superiorly. The uterus and ovaries are grossly normal.  IMPRESSION: 1. CT findings worrisome for gallbladder carcinoma likely E invading the adjacent liver. Could not exclude the possibility of involvement of the 2nd portion of the duodenum and the splenic flexure region of the colon MRI abdomen without and with contrast is recommended for further evaluation of this process. No common bile duct dilatation. 2. Multiple benign appearing and stable hepatic cysts. 3. Multiple pelvic abscesses likely due to perforated diverticulitis.  4. Critical Value/emergent results were called by telephone at the time of interpretation on 10/01/2013 at 3:02 PM to Dr.STEPHEN SOUTH .   Electronically Signed   By: Loralie Champagne M.D.   On: 10/01/2013 15:03   Ir Sinus/fist Tube Chk-non Gi  10/07/2013   CLINICAL DATA:  78 year old female with a history of diverticular abscess status post placement of a CT-guided trans gluteal drain on 10/02/2013. She has subsequently  recovered is now feeling well. A CT of the abdomen and pelvis were obtained earlier today demonstrates no residual fluid collection or abscess. She presents to Interventional Radiology for contrast injection under fluoroscopy to exclude a fistulous connection with the adjacent sigmoid colon.  EXAM: SINUS TRACT INJECTION/FISTULOGRAM  TECHNIQUE: Hand injection of contrast through the existing right trans gluteal pigtail drainage catheter with fluoroscopic imaging.  COMPARISON:  CT abdomen/pelvis obtained earlier today; prior CT abdomen/ pelvis 10/01/2013  FLUOROSCOPY TIME:  36 seconds  FINDINGS: A contrast injection demonstrates filling of a minimal collapsed residual abscess  collection. There is no evidence of fistulous connection with the colon. The injected material was aspirated. The drain was severed and removed. A sterile bandage was placed. The patient tolerated the procedure well; there was no immediate complication.  IMPRESSION: No evidence of fistulous connection between the tube and of the sigmoid colon. There is a small residual collapsed cavity.  The drain was removed.  Signed,  Sterling Big, MD  [facility]  [facility]   Electronically Signed   By: Malachy Moan M.D.   On: 10/07/2013 15:25   Mr 3d Recon At Scanner  10/03/2013   CLINICAL DATA:  Evaluate abnormal gallbladder demonstrated on recent CT scan.  EXAM: MRI ABDOMEN WITHOUT AND WITH CONTRAST (INCLUDING MRCP)  TECHNIQUE: Multiplanar multisequence MR imaging of the abdomen was performed both before and after the administration of intravenous contrast. Heavily T2-weighted images of the biliary and pancreatic ducts were obtained, and three-dimensional MRCP images were rendered by post processing.  CONTRAST:  10mL MULTIHANCE GADOBENATE DIMEGLUMINE 529 MG/ML IV SOLN  COMPARISON:  CT scan 03/14/2009 and 10/01/2013  FINDINGS: The gallbladder demonstrates marked wall thickening and enhancement  postcontrast. There are multiple areas of cystic change in the gallbladder wall and numerous gallstones. I do not see a discrete enhancing mass to suggest gallbladder carcinoma. I think the findings are much more likely due to chronic cholecystitis. No findings for involvement/ invasion of the liver or duodenum. There are multiple benign appearing stable hepatic cysts. The common bile duct is normal in caliber and has a normal course. The pancreatic duct is normal. The pancreas is unremarkable. The spleen is normal in size. No focal lesions. The adrenal glands are slightly enlarged and nodular suggesting nodular hyperplasia. The kidneys are unremarkable.  Mildly dilated small bowel loops and colon may be to a diffuse ileus.  No significant bony findings.  IMPRESSION: MR findings most consistent with chronic calculus cholecystitis. No definite gallbladder mass or involvement of the liver or duodenum.  Normal pancreaticobiliary tree.   Electronically Signed   By: Loralie Champagne M.D.   On: 10/03/2013 11:12   Mr Abd W/wo Cm/mrcp  10/03/2013   CLINICAL DATA:  Evaluate abnormal gallbladder demonstrated on recent CT scan.  EXAM: MRI ABDOMEN WITHOUT AND WITH CONTRAST (INCLUDING MRCP)  TECHNIQUE: Multiplanar multisequence MR imaging of the abdomen was performed both before and after the administration of intravenous contrast. Heavily T2-weighted images of the biliary and pancreatic ducts were obtained, and three-dimensional MRCP images were rendered by post processing.  CONTRAST:  10mL MULTIHANCE GADOBENATE DIMEGLUMINE 529 MG/ML IV SOLN  COMPARISON:  CT scan 03/14/2009 and 10/01/2013  FINDINGS: The gallbladder demonstrates marked wall thickening and enhancement postcontrast. There are multiple areas of cystic change in the gallbladder wall and numerous gallstones. I do not see a discrete enhancing mass to suggest gallbladder carcinoma. I think the findings are much more likely due to chronic cholecystitis. No findings  for involvement/ invasion of the liver or duodenum. There are multiple benign appearing stable hepatic cysts. The common bile duct is normal in caliber and has a normal course. The pancreatic duct is normal. The pancreas is unremarkable. The spleen is normal in size. No focal lesions. The adrenal glands are slightly enlarged and nodular suggesting nodular hyperplasia. The kidneys are unremarkable.  Mildly dilated small bowel loops and colon may be to a diffuse ileus.  No significant bony findings.  IMPRESSION: MR findings most consistent with chronic calculus cholecystitis. No definite gallbladder mass or  involvement of the liver or duodenum.  Normal pancreaticobiliary tree.   Electronically Signed   By: Loralie Champagne M.D.   On: 10/03/2013 11:12   Ct Image Guided Fluid Drain By Catheter  10/02/2013   CLINICAL DATA:  78 year old female with abdominal pain and the pelvic abscess on recent CT scan. She presents for percutaneous drainage.  EXAM: RADIOLOGY EXAMINATION  Date: 10/02/2013  TECHNIQUE: Informed consent was obtained from the patient following explanation of the procedure, risks, benefits and alternatives. The patient understands, agrees and consents for the procedure. All questions were addressed. A time out was performed.  Maximal barrier sterile technique utilized including caps, mask, sterile gowns, sterile gloves, large sterile drape, hand hygiene, and the Betadine skin prep.  The patient was placed in the prone position. A planning axial CT scan was performed. A suitable skin entry site was selected and marked. Local anesthesia was attained by infiltration with 1% lidocaine. Under CT fluoroscopic guidance, an 18 gauge trocar needle was carefully advanced through the sacral spinous ligament and into the deep pelvic fluid collection. Aspiration through the needle return thick, foul-smelling purulent fluid. Wire was then coiled within the fluid collection and the tract serially dilated to 14  [place]. A Cook 14 [place] all-purpose drain was then advanced over the wire and formed with the locking loop in the fluid collection. A total of 80 mL of thick, foul-smelling purulent material was aspirated. A sample was sent for culture. The tube was secured to the skin with 0 Prolene suture and an adhesive fixation device. A sterile bandage was applied.  The patient tolerated the procedure very well ; there was no immediate complication.  ANESTHESIA/SEDATION: Moderate (conscious) sedation was used. 1.5 mg Versed, 50 mcg Fentanyl were administered intravenously. The patient's vital signs were monitored continuously by radiology nursing throughout the procedure.  Sedation Time: 20 minutes  CONTRAST:  None  FLUOROSCOPY TIME:  None  PROCEDURE: 1. CT-guided drain placement Interventional Radiologist:  Sterling Big, MD  IMPRESSION: Successful placement of a 80 French percutaneous drain into the putative diverticular abscess via a right trans gluteal approach.  80 mL of thick, foul smelling purulent fluid was aspirated. A sample was sent for culture.  Recommend maintaining tube to JP bulb suction for at least 24-48 hr. Convert to gravity bag drainage prior to discharge to minimize risk of fistula formation. Follow up CT scan of the pelvis and tube injection under fluoroscopy prior 2 drain removal.  Signed,  Sterling Big, MD  [facility]  [facility]   Electronically Signed   By: Malachy Moan M.D.   On: 10/02/2013 12:12   Orders placed during the hospital encounter of 10/24/13  . EKG 12-LEAD  . EKG 12-LEAD    Assessment/Plan SIRS w/ presumed abdominal source - WBC elevated, tachycardic, febrile. CT shows continued presence of chronic cholecystitis but otherwise nothing acute and resolution of prior abd abscess. CXR unremarkable. U/A pending. Broad spec abx per pharmacy given patient developed SIRS despite being on cipro on at home. Continue IVF at  100cc/hr. Cultures pending.   Chronic cholecystitis - surgery on board and considering cholecystectomy. Appreciate their help in this case. Will follow their recs   hypokalemia - replacing orally and in IVF   HTN - continuing home meds given normotensive in ED . Will stop if develops hypotension  Continuing all other home meds PPx - Lovenox  Dispo - likely home vs SNF in 3-5 days pending clinical  improvement    Slayden Mennenga 10/24/2013, 11:09PM

## 2013-10-25 NOTE — Progress Notes (Signed)
Subjective: Pt feels fine today.  Denies abdominal pain or nausea  Objective: Vital signs in last 24 hours: Temp:  [97.6 F (36.4 C)-102.3 F (39.1 C)] 97.6 F (36.4 C) (11/10 0544) Pulse Rate:  [76-119] 76 (11/10 0544) Resp:  [13-29] 20 (11/10 0544) BP: (103-128)/(55-72) 103/57 mmHg (11/10 0544) SpO2:  [95 %-97 %] 97 % (11/10 0544) Weight:  [93 lb 4.1 oz (42.3 kg)-102 lb 15.3 oz (46.7 kg)] 93 lb 4.1 oz (42.3 kg) (11/10 0114) Last BM Date: 10/25/13  Intake/Output from previous day: 11/09 0701 - 11/10 0700 In: 500 [I.V.:500] Out: -  Intake/Output this shift:    GI: neg Murphy sign  large easily palpable gallbladder.  non tender  Lab Results:   Recent Labs  10/24/13 2055 10/25/13 0620  WBC 15.7* 10.4  HGB 10.9* 9.5*  HCT 31.7* 28.2*  PLT 368 305   BMET  Recent Labs  10/24/13 2055 10/25/13 0620  NA 133* 138  K 3.2* 3.4*  CL 96 105  CO2 24 24  GLUCOSE 131* 107*  BUN 10 6  CREATININE 0.57 0.60  CALCIUM 8.9 8.5   PT/INR No results found for this basename: LABPROT, INR,  in the last 72 hours ABG No results found for this basename: PHART, PCO2, PO2, HCO3,  in the last 72 hours  Studies/Results: Dg Chest 2 View  10/24/2013   CLINICAL DATA:  Weakness for 3-4 weeks, smoking history  EXAM: CHEST  2 VIEW  COMPARISON:  None.  FINDINGS: The heart size and mediastinal contours are normal. Vascular pattern is normal. Lungs are clear except for mild scarring or atelectasis in the left lung base. No pleural effusion.  IMPRESSION: No acute abnormalities.   Electronically Signed   By: Esperanza Heir M.D.   On: 10/24/2013 21:46   Ct Abdomen Pelvis W Contrast  10/25/2013   CLINICAL DATA:  Weakness.  EXAM: CT ABDOMEN AND PELVIS WITH CONTRAST  TECHNIQUE: Multidetector CT imaging of the abdomen and pelvis was performed using the standard protocol following bolus administration of intravenous contrast.  CONTRAST:  80mL OMNIPAQUE IOHEXOL 300 MG/ML  SOLN  COMPARISON:  CT of the  abdomen and pelvis performed 10/07/2013  FINDINGS: Mild bibasilar atelectasis is noted. There is mild bronchiectasis at the lower lung lobes.  Scattered hepatic cysts are seen, measuring up to 2.6 cm in size. These are grossly unchanged from the recent prior study. The spleen is unremarkable in appearance. There is persistent chronic inflammation of the gallbladder, with stones seen filling the gallbladder. The gallbladder wall currently measures 1.1 cm in diameter, slightly improved from the prior study, though surrounding soft tissue inflammation is noted. The pancreas and adrenal glands are unremarkable in appearance.  The kidneys are unremarkable in appearance. There is no evidence of hydronephrosis. No renal or ureteral stones are seen. No perinephric stranding is appreciated.  No significant free fluid is seen within the abdomen or pelvis. The visualized small bowel is unremarkable in appearance. The stomach is within normal limits. Scattered calcification is noted along the abdominal aorta and its branches. No acute vascular abnormalities are seen.  The appendix is not definitely characterized; there is no evidence for appendicitis. Minimal diverticulosis is noted along the distal descending and sigmoid colon, without evidence of diverticulitis. The colon is otherwise unremarkable in appearance.  The bladder is mildly distended and grossly unremarkable in appearance. The uterus is within normal limits. The previously noted dominant pelvic abscess appears to have resolved; a smaller collection of fluid at the  right hemipelvis could simply reflect cysts within the right ovary. Mild chronic soft tissue inflammation is noted within the upper pelvis. No suspicious adnexal masses are seen. No inguinal lymphadenopathy is appreciated.  No acute osseus abnormalities are identified.  IMPRESSION: 1. No acute abnormality seen to explain the patient's symptoms. 2. Persistent chronic inflammation of the gallbladder, with  stones filling the gallbladder and marked gallbladder wall thickening to 1.1 cm. Surrounding soft tissue inflammation noted. Findings compatible with persistent chronic cholecystitis. 3. Dominant pelvic abscess appears to have resolved; smaller collection of fluid at the right hemipelvis could simply reflect cysts within the right ovary. Mild chronic residual soft tissue inflammation noted within the upper pelvis. 4. Scattered hepatic cysts noted. 5. Scattered calcification along the abdominal aorta and its branches. 6. Minimal diverticulosis along the distal descending and sigmoid colon, without evidence of diverticulitis. 7. Mild bibasilar atelectasis noted; mild bronchiectasis at the lower lung lobes.   Electronically Signed   By: Roanna Raider M.D.   On: 10/25/2013 00:00    Anti-infectives: Anti-infectives   Start     Dose/Rate Route Frequency Ordered Stop   10/25/13 2200  vancomycin (VANCOCIN) IVPB 1000 mg/200 mL premix     1,000 mg 200 mL/hr over 60 Minutes Intravenous Every 24 hours 10/24/13 2219     10/25/13 0800  piperacillin-tazobactam (ZOSYN) IVPB 3.375 g     3.375 g 12.5 mL/hr over 240 Minutes Intravenous Every 8 hours 10/24/13 2218     10/24/13 2200  piperacillin-tazobactam (ZOSYN) IVPB 3.375 g     3.375 g 100 mL/hr over 30 Minutes Intravenous  Once 10/24/13 2156 10/24/13 2332   10/24/13 2200  vancomycin (VANCOCIN) IVPB 1000 mg/200 mL premix     1,000 mg 200 mL/hr over 60 Minutes Intravenous  Once 10/24/13 2156 10/25/13 0056      Assessment/Plan: I have reviewed the patients chart,  Films,  Labs and examined the patient.  She has chronic cholecystitis,  Severe PC malnutrition and resolved diverticulitis.  She is not improving as an outpatient at this point for delayed cholecystectomy and is having recurrent symptoms. She needs cholecystectomy but is at a high risk of complication due to her comorbid conditions.  Unfortunately she has failed conservative management and I do not  feel percutaneous drainage would benefit her at this time. I discussed complication risks of 30 % with either open or laparoscopic cholecystectomy.  She will more than likely need open cholecystectomy to get this out and improve her symptoms. She is argreable to proceed with laparoscopic/ open cholecystectomy.  Will schedule for Tuesday in am.  High risk of wound complications. The procedure has been discussed with the patient. Operative and non operative treatments have been discussed. Risks of surgery include bleeding, infection,  Common bile duct injury,  Injury to the stomach,liver, colon,small intestine, abdominal wall,  Diaphragm,  Major blood vessels,  And the need for an open procedure.  Other risks include worsening of medical problems, death,  DVT and pulmonary embolism, and cardiovascular events.   Medical options have also been discussed. The patient has been informed of long term expectations of surgery and non surgical options,  The patient agrees to proceed.    LOS: 1 day    Dorothymae Maciver A. 10/25/2013

## 2013-10-26 ENCOUNTER — Inpatient Hospital Stay (HOSPITAL_COMMUNITY): Payer: BC Managed Care – PPO | Admitting: Anesthesiology

## 2013-10-26 ENCOUNTER — Encounter (HOSPITAL_COMMUNITY): Payer: Self-pay | Admitting: Anesthesiology

## 2013-10-26 ENCOUNTER — Encounter (HOSPITAL_COMMUNITY): Payer: Self-pay | Admitting: *Deleted

## 2013-10-26 ENCOUNTER — Encounter (HOSPITAL_COMMUNITY): Admission: EM | Disposition: A | Discharge status: Skilled Nursing Facility | Payer: Self-pay | Source: Home / Self Care

## 2013-10-26 ENCOUNTER — Encounter (HOSPITAL_COMMUNITY): Payer: BC Managed Care – PPO | Admitting: Anesthesiology

## 2013-10-26 ENCOUNTER — Inpatient Hospital Stay (HOSPITAL_COMMUNITY): Payer: BC Managed Care – PPO

## 2013-10-26 DIAGNOSIS — K8010 Calculus of gallbladder with chronic cholecystitis without obstruction: Secondary | ICD-10-CM

## 2013-10-26 HISTORY — PX: CHOLECYSTECTOMY: SHX55

## 2013-10-26 LAB — SURGICAL PCR SCREEN
MRSA, PCR: NEGATIVE
Staphylococcus aureus: NEGATIVE

## 2013-10-26 LAB — ABO/RH: ABO/RH(D): A POS

## 2013-10-26 LAB — POCT I-STAT 4, (NA,K, GLUC, HGB,HCT)
Glucose, Bld: 146 mg/dL — ABNORMAL HIGH (ref 70–99)
HCT: 25 % — ABNORMAL LOW (ref 36.0–46.0)
Hemoglobin: 8.5 g/dL — ABNORMAL LOW (ref 12.0–15.0)
Potassium: 4.2 mEq/L (ref 3.5–5.1)
Sodium: 136 mEq/L (ref 135–145)

## 2013-10-26 LAB — CBC
HCT: 27.6 % — ABNORMAL LOW (ref 36.0–46.0)
Hemoglobin: 9.2 g/dL — ABNORMAL LOW (ref 12.0–15.0)
MCH: 30.4 pg (ref 26.0–34.0)
MCHC: 33.3 g/dL (ref 30.0–36.0)
MCV: 91.1 fL (ref 78.0–100.0)
Platelets: 265 10*3/uL (ref 150–400)
RBC: 3.03 MIL/uL — ABNORMAL LOW (ref 3.87–5.11)
RDW: 17.9 % — ABNORMAL HIGH (ref 11.5–15.5)
WBC: 19 10*3/uL — ABNORMAL HIGH (ref 4.0–10.5)

## 2013-10-26 LAB — URINE CULTURE: Colony Count: >=100000

## 2013-10-26 LAB — PREPARE RBC (CROSSMATCH)

## 2013-10-26 SURGERY — LAPAROSCOPIC CHOLECYSTECTOMY WITH INTRAOPERATIVE CHOLANGIOGRAM
Anesthesia: General | Site: Abdomen | Wound class: Dirty or Infected

## 2013-10-26 MED ORDER — DIPHENHYDRAMINE HCL 50 MG/ML IJ SOLN
12.5000 mg | Freq: Four times a day (QID) | INTRAMUSCULAR | Status: DC | PRN
  Administered 2013-11-04: 12.5 mg via INTRAVENOUS

## 2013-10-26 MED ORDER — NALOXONE HCL 0.4 MG/ML IJ SOLN: 0.4000 mg | INTRAMUSCULAR | Status: DC | PRN

## 2013-10-26 MED ORDER — ONDANSETRON HCL 4 MG/2ML IJ SOLN: 4.0000 mg | Freq: Four times a day (QID) | INTRAMUSCULAR | Status: DC | PRN

## 2013-10-26 MED ORDER — SODIUM CHLORIDE 0.9 % IJ SOLN: 9.0000 mL | INTRAMUSCULAR | Status: DC | PRN

## 2013-10-26 MED ORDER — LACTATED RINGERS IV SOLN
INTRAVENOUS | Status: DC
  Administered 2013-10-26: 10:00:00 via INTRAVENOUS

## 2013-10-26 MED ORDER — FENTANYL CITRATE 0.05 MG/ML IJ SOLN
25.0000 ug | INTRAMUSCULAR | Status: DC | PRN
  Administered 2013-10-26: 25 ug via INTRAVENOUS

## 2013-10-26 MED ORDER — SODIUM CHLORIDE 0.9 % IV SOLN
10.0000 mg | INTRAVENOUS | Status: DC | PRN
  Administered 2013-10-26: 10 ug/min via INTRAVENOUS

## 2013-10-26 MED ORDER — BUPIVACAINE-EPINEPHRINE 0.25% -1:200000 IJ SOLN
INTRAMUSCULAR | Status: DC | PRN
  Administered 2013-10-26: 6 mL

## 2013-10-26 MED ORDER — HYDROMORPHONE HCL PF 1 MG/ML IJ SOLN
INTRAMUSCULAR | Status: DC | PRN
  Administered 2013-10-26: 1 mg via INTRAVENOUS

## 2013-10-26 MED ORDER — FENTANYL CITRATE 0.05 MG/ML IJ SOLN
INTRAMUSCULAR | Status: AC
  Filled 2013-10-26: qty 2

## 2013-10-26 MED ORDER — PROPOFOL 10 MG/ML IV BOLUS
INTRAVENOUS | Status: DC | PRN
  Administered 2013-10-26: 150 mg via INTRAVENOUS

## 2013-10-26 MED ORDER — ARTIFICIAL TEARS OP OINT
TOPICAL_OINTMENT | OPHTHALMIC | Status: DC | PRN
  Administered 2013-10-26: 1 via OPHTHALMIC

## 2013-10-26 MED ORDER — DIPHENHYDRAMINE HCL 12.5 MG/5ML PO ELIX
12.5000 mg | ORAL_SOLUTION | Freq: Four times a day (QID) | ORAL | Status: DC | PRN
  Filled 2013-10-26: qty 5

## 2013-10-26 MED ORDER — PHENYLEPHRINE HCL 10 MG/ML IJ SOLN
INTRAMUSCULAR | Status: DC | PRN
  Administered 2013-10-26 (×3): 80 ug via INTRAVENOUS
  Administered 2013-10-26: 120 ug via INTRAVENOUS

## 2013-10-26 MED ORDER — 0.9 % SODIUM CHLORIDE (POUR BTL) OPTIME
TOPICAL | Status: DC | PRN
  Administered 2013-10-26: 2000 mL

## 2013-10-26 MED ORDER — BUPIVACAINE-EPINEPHRINE PF 0.25-1:200000 % IJ SOLN
INTRAMUSCULAR | Status: AC
  Filled 2013-10-26: qty 30

## 2013-10-26 MED ORDER — OXYCODONE HCL 5 MG/5ML PO SOLN: 5.0000 mg | Freq: Once | ORAL | Status: DC | PRN

## 2013-10-26 MED ORDER — ALBUMIN HUMAN 5 % IV SOLN
INTRAVENOUS | Status: DC | PRN
  Administered 2013-10-26 (×2): via INTRAVENOUS

## 2013-10-26 MED ORDER — OXYCODONE HCL 5 MG PO TABS: 5.0000 mg | ORAL_TABLET | Freq: Once | ORAL | Status: DC | PRN

## 2013-10-26 MED ORDER — HYDROMORPHONE 0.3 MG/ML IV SOLN
INTRAVENOUS | Status: AC
  Filled 2013-10-26: qty 25

## 2013-10-26 MED ORDER — GLYCOPYRROLATE 0.2 MG/ML IJ SOLN
INTRAMUSCULAR | Status: DC | PRN
  Administered 2013-10-26: .6 mg via INTRAVENOUS

## 2013-10-26 MED ORDER — SODIUM CHLORIDE 0.9 % IV SOLN
INTRAVENOUS | Status: DC | PRN
  Administered 2013-10-26: 12:00:00

## 2013-10-26 MED ORDER — ROCURONIUM BROMIDE 100 MG/10ML IV SOLN
INTRAVENOUS | Status: DC | PRN
  Administered 2013-10-26: 50 mg via INTRAVENOUS

## 2013-10-26 MED ORDER — FENTANYL CITRATE 0.05 MG/ML IJ SOLN
INTRAMUSCULAR | Status: DC | PRN
  Administered 2013-10-26 (×4): 50 ug via INTRAVENOUS
  Administered 2013-10-26: 100 ug via INTRAVENOUS
  Administered 2013-10-26 (×2): 50 ug via INTRAVENOUS

## 2013-10-26 MED ORDER — ONDANSETRON HCL 4 MG/2ML IJ SOLN
INTRAMUSCULAR | Status: DC | PRN
  Administered 2013-10-26: 4 mg via INTRAVENOUS

## 2013-10-26 MED ORDER — LACTATED RINGERS IV SOLN
INTRAVENOUS | Status: DC | PRN
  Administered 2013-10-26 (×3): via INTRAVENOUS

## 2013-10-26 MED ORDER — SODIUM CHLORIDE 0.9 % IR SOLN
Status: DC | PRN
  Administered 2013-10-26: 1000 mL
  Administered 2013-10-26: 3000 mL

## 2013-10-26 MED ORDER — LIDOCAINE HCL (CARDIAC) 20 MG/ML IV SOLN
INTRAVENOUS | Status: DC | PRN
  Administered 2013-10-26: 60 mg via INTRAVENOUS

## 2013-10-26 MED ORDER — NEOSTIGMINE METHYLSULFATE 1 MG/ML IJ SOLN
INTRAMUSCULAR | Status: DC | PRN
  Administered 2013-10-26: 3 mg via INTRAVENOUS

## 2013-10-26 MED ORDER — HYDROMORPHONE 0.3 MG/ML IV SOLN
INTRAVENOUS | Status: DC
  Administered 2013-10-26: 14:00:00 via INTRAVENOUS
  Administered 2013-10-26: 0.4 mg via INTRAVENOUS
  Administered 2013-10-27: 20:00:00 via INTRAVENOUS
  Administered 2013-10-27: 1.19 mg via INTRAVENOUS
  Administered 2013-10-27: 0.999 mg via INTRAVENOUS
  Administered 2013-10-27: 1.46 mg via INTRAVENOUS
  Administered 2013-10-27: 1.49 mg via INTRAVENOUS
  Administered 2013-10-28: 1.33 mg via INTRAVENOUS
  Administered 2013-10-28: 0.999 mg via INTRAVENOUS
  Administered 2013-10-28: 0.99 mg via INTRAVENOUS
  Administered 2013-10-28: 1.86 mg via INTRAVENOUS
  Administered 2013-10-28: 0.595 mg via INTRAVENOUS
  Administered 2013-10-29: 1.69 mg via INTRAVENOUS
  Administered 2013-10-29: 0.2 mg via INTRAVENOUS
  Administered 2013-10-29: 09:00:00 via INTRAVENOUS
  Administered 2013-10-29: 1.2 mg via INTRAVENOUS
  Administered 2013-10-29: 0.2 mg via INTRAVENOUS
  Administered 2013-10-29: 1.5 mg via INTRAVENOUS
  Administered 2013-10-29: 0.4 mg via INTRAVENOUS
  Administered 2013-10-30 (×2): 1.5 mg via INTRAVENOUS
  Administered 2013-10-30: 20:00:00 via INTRAVENOUS
  Administered 2013-10-30: 0.6 mg via INTRAVENOUS
  Administered 2013-10-30: 5.94 mg via INTRAVENOUS
  Administered 2013-10-30: 1.95 mg via INTRAVENOUS
  Administered 2013-10-30: 2.4 mg via INTRAVENOUS
  Administered 2013-10-30: 09:00:00 via INTRAVENOUS
  Administered 2013-10-31: 3 mg via INTRAVENOUS
  Administered 2013-10-31: 3.3 mg via INTRAVENOUS
  Administered 2013-10-31: 2.1 mg via INTRAVENOUS
  Administered 2013-10-31: 1.5 mg via INTRAVENOUS
  Administered 2013-10-31: 08:00:00 via INTRAVENOUS
  Filled 2013-10-26 (×5): qty 25

## 2013-10-26 SURGICAL SUPPLY — 74 items
APPLIER CLIP ROT 10 11.4 M/L (STAPLE) ×2
BANDAGE GAUZE ELAST BULKY 4 IN (GAUZE/BANDAGES/DRESSINGS) ×2 IMPLANT
BLADE SURG 10 STRL SS (BLADE) ×2 IMPLANT
BLADE SURG ROTATE 9660 (MISCELLANEOUS) IMPLANT
CANISTER SUCTION 2500CC (MISCELLANEOUS) ×2 IMPLANT
CATH REDDICK CHOLANGI 4FR 50CM (CATHETERS) ×2 IMPLANT
CHLORAPREP W/TINT 26ML (MISCELLANEOUS) ×2 IMPLANT
CLIP APPLIE ROT 10 11.4 M/L (STAPLE) ×1 IMPLANT
COVER MAYO STAND STRL (DRAPES) ×2 IMPLANT
COVER SURGICAL LIGHT HANDLE (MISCELLANEOUS) ×2 IMPLANT
DECANTER SPIKE VIAL GLASS SM (MISCELLANEOUS) IMPLANT
DERMABOND ADVANCED (GAUZE/BANDAGES/DRESSINGS) ×1
DERMABOND ADVANCED .7 DNX12 (GAUZE/BANDAGES/DRESSINGS) ×1 IMPLANT
DISSECTOR BLUNT TIP ENDO 5MM (MISCELLANEOUS) ×2 IMPLANT
DRAIN CHANNEL 19F RND (DRAIN) ×2 IMPLANT
DRAPE C-ARM 42X72 X-RAY (DRAPES) ×2 IMPLANT
DRAPE UTILITY 15X26 W/TAPE STR (DRAPE) ×4 IMPLANT
DRAPE WARM FLUID 44X44 (DRAPE) ×2 IMPLANT
DRSG PAD ABDOMINAL 8X10 ST (GAUZE/BANDAGES/DRESSINGS) ×4 IMPLANT
ELECT CAUTERY BLADE 6.4 (BLADE) ×2 IMPLANT
ELECT REM PT RETURN 9FT ADLT (ELECTROSURGICAL) ×2
ELECTRODE REM PT RTRN 9FT ADLT (ELECTROSURGICAL) ×1 IMPLANT
EVACUATOR SILICONE 100CC (DRAIN) ×2 IMPLANT
GLOVE BIO SURGEON STRL SZ 6.5 (GLOVE) ×4 IMPLANT
GLOVE BIO SURGEON STRL SZ7.5 (GLOVE) ×2 IMPLANT
GLOVE BIO SURGEON STRL SZ8 (GLOVE) ×2 IMPLANT
GLOVE BIOGEL PI IND STRL 7.0 (GLOVE) ×2 IMPLANT
GLOVE BIOGEL PI IND STRL 7.5 (GLOVE) ×1 IMPLANT
GLOVE BIOGEL PI IND STRL 8 (GLOVE) ×1 IMPLANT
GLOVE BIOGEL PI INDICATOR 7.0 (GLOVE) ×2
GLOVE BIOGEL PI INDICATOR 7.5 (GLOVE) ×1
GLOVE BIOGEL PI INDICATOR 8 (GLOVE) ×1
GLOVE ECLIPSE 7.5 STRL STRAW (GLOVE) ×4 IMPLANT
GLOVE SS BIOGEL STRL SZ 6.5 (GLOVE) ×1 IMPLANT
GLOVE SUPERSENSE BIOGEL SZ 6.5 (GLOVE) ×1
GOWN STRL NON-REIN LRG LVL3 (GOWN DISPOSABLE) ×8 IMPLANT
GOWN STRL REIN XL XLG (GOWN DISPOSABLE) ×4 IMPLANT
HEMOSTAT SURGICEL 2X4 FIBR (HEMOSTASIS) ×2 IMPLANT
KIT BASIN OR (CUSTOM PROCEDURE TRAY) ×2 IMPLANT
KIT ROOM TURNOVER OR (KITS) ×2 IMPLANT
NS IRRIG 1000ML POUR BTL (IV SOLUTION) ×12 IMPLANT
PAD ARMBOARD 7.5X6 YLW CONV (MISCELLANEOUS) ×2 IMPLANT
PENCIL BUTTON BLDE SNGL 10FT (ELECTRODE) ×2 IMPLANT
POUCH SPECIMEN RETRIEVAL 10MM (ENDOMECHANICALS) ×2 IMPLANT
SCISSORS LAP 5X35 DISP (ENDOMECHANICALS) IMPLANT
SET CHOLANGIOGRAPH 5 50 .035 (SET/KITS/TRAYS/PACK) ×2 IMPLANT
SET IRRIG TUBING LAPAROSCOPIC (IRRIGATION / IRRIGATOR) ×2 IMPLANT
SLEEVE ENDOPATH XCEL 5M (ENDOMECHANICALS) ×2 IMPLANT
SPECIMEN JAR MEDIUM (MISCELLANEOUS) ×2 IMPLANT
SPECIMEN JAR SMALL (MISCELLANEOUS) IMPLANT
SPONGE GAUZE 4X4 12PLY (GAUZE/BANDAGES/DRESSINGS) ×2 IMPLANT
SPONGE INTESTINAL PEANUT (DISPOSABLE) ×2 IMPLANT
SPONGE LAP 18X18 X RAY DECT (DISPOSABLE) ×12 IMPLANT
STAPLER SKIN PROX WIDE 3.9 (STAPLE) ×2 IMPLANT
SUCTION POOLE TIP (SUCTIONS) ×4 IMPLANT
SUT ETHILON 2 0 FS 18 (SUTURE) ×2 IMPLANT
SUT MNCRL AB 4-0 PS2 18 (SUTURE) ×2 IMPLANT
SUT PDS AB 1 CT  36 (SUTURE) ×4
SUT PDS AB 1 CT 36 (SUTURE) ×4 IMPLANT
SUT VIC AB 2-0 SH 18 (SUTURE) ×8 IMPLANT
SUT VIC AB 3-0 SH 18 (SUTURE) ×4 IMPLANT
SUT VICRYL 0 UR6 27IN ABS (SUTURE) ×4 IMPLANT
SUT VICRYL AB 2 0 TIES (SUTURE) ×2 IMPLANT
TAPE CLOTH SURG 6X10 WHT LF (GAUZE/BANDAGES/DRESSINGS) ×2 IMPLANT
TOWEL OR 17X24 6PK STRL BLUE (TOWEL DISPOSABLE) ×2 IMPLANT
TOWEL OR 17X26 10 PK STRL BLUE (TOWEL DISPOSABLE) ×2 IMPLANT
TOWEL OR NON WOVEN STRL DISP B (DISPOSABLE) ×2 IMPLANT
TRAY FOLEY CATH 14FR (SET/KITS/TRAYS/PACK) ×2 IMPLANT
TRAY LAPAROSCOPIC (CUSTOM PROCEDURE TRAY) ×2 IMPLANT
TROCAR XCEL BLUNT TIP 100MML (ENDOMECHANICALS) ×2 IMPLANT
TROCAR XCEL NON-BLD 11X100MML (ENDOMECHANICALS) ×2 IMPLANT
TROCAR XCEL NON-BLD 5MMX100MML (ENDOMECHANICALS) ×2 IMPLANT
TUBE CONNECTING 12X1/4 (SUCTIONS) ×2 IMPLANT
YANKAUER SUCT BULB TIP NO VENT (SUCTIONS) ×2 IMPLANT

## 2013-10-26 NOTE — Progress Notes (Signed)
ST 110-118; SBP in the 90's. Dr Shelly Flatten notofied. New order for PRBC transfusion.

## 2013-10-26 NOTE — Anesthesia Postprocedure Evaluation (Signed)
Anesthesia Post Note  Patient: [name]  Procedure(s) Performed: Procedure(s) (LRB): ATTEMPTED  LAPAROSCOPIC CONVERTED TO OPEN CHOLECYSTECTOMY WITH INTRAOPERATIVE CHOLANGIOGRAM; REPAIR OF CHOLECOLONIC FISTULA; cholecystoduodenal fistula repair; lysis of adhesions; drainage of pelvic abscess; omental patching of duodenal repair (N/A)  Anesthesia type: General  Patient location: PACU  Post pain: Pain level controlled and Adequate analgesia  Post assessment: Post-op Vital signs reviewed, Patient's Cardiovascular Status Stable, Respiratory Function Stable, Patent Airway and Pain level controlled  Last Vitals:  Filed Vitals:   10/26/13 1530  BP: 80/47  Pulse: 103  Temp:   Resp: 13    Post vital signs: Reviewed and stable  Level of consciousness: awake, alert  and oriented  Complications: No apparent anesthesia complications

## 2013-10-26 NOTE — Progress Notes (Signed)
Dr Luisa Hart 9in surgery) updated by OR RN. Pt will go to ICU

## 2013-10-26 NOTE — Progress Notes (Signed)
Family at bedside, updated re BP issues, aware pt to go to ICU overnight, 2S  03. Questions answered, Dr Jean Rosenthal also here & spoke with them.

## 2013-10-26 NOTE — H&P (View-Only) (Signed)
Subjective: Pt in good spirits.  No complaints.  Objective: Vital signs in last 24 hours: Temp:  [98 F (36.7 C)-101.1 F (38.4 C)] 98 F (36.7 C) (11/11 0981) Pulse Rate:  [84-89] 85 (11/11 0638) Resp:  [18-20] 20 (11/11 0638) BP: (107-132)/(56-63) 111/63 mmHg (11/11 0638) SpO2:  [94 %-99 %] 95 % (11/11 0638) Last BM Date: 10/25/13  Intake/Output from previous day: 11/10 0701 - 11/11 0700 In: 2457.1 [I.V.:2107.1; IV Piggyback:350] Out: -  Intake/Output this shift:    no change to abdominal exam from yesterday  Lab Results:   Recent Labs  10/24/13 2055 10/25/13 0620  WBC 15.7* 10.4  HGB 10.9* 9.5*  HCT 31.7* 28.2*  PLT 368 305   BMET  Recent Labs  10/24/13 2055 10/25/13 0620  NA 133* 138  K 3.2* 3.4*  CL 96 105  CO2 24 24  GLUCOSE 131* 107*  BUN 10 6  CREATININE 0.57 0.60  CALCIUM 8.9 8.5   PT/INR No results found for this basename: LABPROT, INR,  in the last 72 hours ABG No results found for this basename: PHART, PCO2, PO2, HCO3,  in the last 72 hours  Studies/Results: Dg Chest 2 View  10/24/2013   CLINICAL DATA:  Weakness for 3-4 weeks, smoking history  EXAM: CHEST  2 VIEW  COMPARISON:  None.  FINDINGS: The heart size and mediastinal contours are normal. Vascular pattern is normal. Lungs are clear except for mild scarring or atelectasis in the left lung base. No pleural effusion.  IMPRESSION: No acute abnormalities.   Electronically Signed   By: Esperanza Heir M.D.   On: 10/24/2013 21:46   Ct Abdomen Pelvis W Contrast  10/25/2013   CLINICAL DATA:  Weakness.  EXAM: CT ABDOMEN AND PELVIS WITH CONTRAST  TECHNIQUE: Multidetector CT imaging of the abdomen and pelvis was performed using the standard protocol following bolus administration of intravenous contrast.  CONTRAST:  80mL OMNIPAQUE IOHEXOL 300 MG/ML  SOLN  COMPARISON:  CT of the abdomen and pelvis performed 10/07/2013  FINDINGS: Mild bibasilar atelectasis is noted. There is mild bronchiectasis at  the lower lung lobes.  Scattered hepatic cysts are seen, measuring up to 2.6 cm in size. These are grossly unchanged from the recent prior study. The spleen is unremarkable in appearance. There is persistent chronic inflammation of the gallbladder, with stones seen filling the gallbladder. The gallbladder wall currently measures 1.1 cm in diameter, slightly improved from the prior study, though surrounding soft tissue inflammation is noted. The pancreas and adrenal glands are unremarkable in appearance.  The kidneys are unremarkable in appearance. There is no evidence of hydronephrosis. No renal or ureteral stones are seen. No perinephric stranding is appreciated.  No significant free fluid is seen within the abdomen or pelvis. The visualized small bowel is unremarkable in appearance. The stomach is within normal limits. Scattered calcification is noted along the abdominal aorta and its branches. No acute vascular abnormalities are seen.  The appendix is not definitely characterized; there is no evidence for appendicitis. Minimal diverticulosis is noted along the distal descending and sigmoid colon, without evidence of diverticulitis. The colon is otherwise unremarkable in appearance.  The bladder is mildly distended and grossly unremarkable in appearance. The uterus is within normal limits. The previously noted dominant pelvic abscess appears to have resolved; a smaller collection of fluid at the right hemipelvis could simply reflect cysts within the right ovary. Mild chronic soft tissue inflammation is noted within the upper pelvis. No suspicious adnexal masses are seen.  No inguinal lymphadenopathy is appreciated.  No acute osseus abnormalities are identified.  IMPRESSION: 1. No acute abnormality seen to explain the patient's symptoms. 2. Persistent chronic inflammation of the gallbladder, with stones filling the gallbladder and marked gallbladder wall thickening to 1.1 cm. Surrounding soft tissue inflammation  noted. Findings compatible with persistent chronic cholecystitis. 3. Dominant pelvic abscess appears to have resolved; smaller collection of fluid at the right hemipelvis could simply reflect cysts within the right ovary. Mild chronic residual soft tissue inflammation noted within the upper pelvis. 4. Scattered hepatic cysts noted. 5. Scattered calcification along the abdominal aorta and its branches. 6. Minimal diverticulosis along the distal descending and sigmoid colon, without evidence of diverticulitis. 7. Mild bibasilar atelectasis noted; mild bronchiectasis at the lower lung lobes.   Electronically Signed   By: Roanna Raider M.D.   On: 10/25/2013 00:00    Anti-infectives: Anti-infectives   Start     Dose/Rate Route Frequency Ordered Stop   10/26/13 0600  ceFAZolin (ANCEF) 3 g in dextrose 5 % 50 mL IVPB     3 g 160 mL/hr over 30 Minutes Intravenous On call to O.R. 10/25/13 1415 10/27/13 0559   10/25/13 2200  vancomycin (VANCOCIN) IVPB 1000 mg/200 mL premix     1,000 mg 200 mL/hr over 60 Minutes Intravenous Every 24 hours 10/24/13 2219     10/25/13 0800  piperacillin-tazobactam (ZOSYN) IVPB 3.375 g     3.375 g 12.5 mL/hr over 240 Minutes Intravenous Every 8 hours 10/24/13 2218     10/24/13 2200  piperacillin-tazobactam (ZOSYN) IVPB 3.375 g     3.375 g 100 mL/hr over 30 Minutes Intravenous  Once 10/24/13 2156 10/24/13 2332   10/24/13 2200  vancomycin (VANCOCIN) IVPB 1000 mg/200 mL premix     1,000 mg 200 mL/hr over 60 Minutes Intravenous  Once 10/24/13 2156 10/25/13 0056      Assessment/Plan: Patient Active Problem List   Diagnosis Date Noted  . Severe protein-calorie malnutrition 10/09/2013    Class: Chronic  . Tobacco use disorder 10/09/2013    Class: Chronic  . Diarrhea 10/09/2013    Class: Acute  . Pericolonic abscess due to diverticulitis 10/06/2013  . Diverticular disease small and large intestine, perforation, abscess 10/01/2013  . Calculus of gallbladder with acute  cholecystitis, without mention of obstruction 10/01/2013  . Loss of weight 10/01/2013  . Goiter 10/01/2013  OR today for lap / open cholecystectomy possible cholangiogram.  The procedure has been discussed with the patient. Operative and non operative treatments have been discussed. Risks of surgery include bleeding, infection,  Common bile duct injury,  Injury to the stomach,liver, colon,small intestine, abdominal wall,  Diaphragm,  Major blood vessels,  And the need for an open procedure.  Other risks include worsening of medical problems, death,  DVT and pulmonary embolism, and cardiovascular events.   Medical options have also been discussed. The patient has been informed of long term expectations of surgery and non surgical options,  The patient agrees to proceed.    LOS: 2 days    Jaivian Battaglini A. 10/26/2013

## 2013-10-26 NOTE — Interval H&P Note (Signed)
History and Physical Interval Note:  10/26/2013 9:31 AM  [name]  has presented today for surgery, with the diagnosis of gallstones  The various methods of treatment have been discussed with the patient and family. After consideration of risks, benefits and other options for treatment, the patient has consented to  Procedure(s): LAPAROSCOPIC CHOLECYSTECTOMY WITH INTRAOPERATIVE CHOLANGIOGRAM (N/A) as a surgical intervention .  The patient's history has been reviewed, patient examined, no change in status, stable for surgery.  I have reviewed the patient's chart and labs.  Questions were answered to the patient's satisfaction.     Pascuala Klutts A.

## 2013-10-26 NOTE — Anesthesia Preprocedure Evaluation (Signed)
Anesthesia Evaluation  Patient identified by MRN, date of birth, ID band Patient awake    Reviewed: Allergy & Precautions, H&P , NPO status , Patient's Chart, lab work & pertinent test results  Airway Mallampati: II  Neck ROM: full    Dental   Pulmonary Current Smoker,          Cardiovascular hypertension,     Neuro/Psych Depression    GI/Hepatic   Endo/Other  Hypothyroidism   Renal/GU      Musculoskeletal   Abdominal   Peds  Hematology   Anesthesia Other Findings   Reproductive/Obstetrics                           Anesthesia Physical Anesthesia Plan  ASA: II  Anesthesia Plan: General   Post-op Pain Management:    Induction: Intravenous  Airway Management Planned: Oral ETT  Additional Equipment:   Intra-op Plan:   Post-operative Plan: Extubation in OR  Informed Consent: I have reviewed the patients History and Physical, chart, labs and discussed the procedure including the risks, benefits and alternatives for the proposed anesthesia with the patient or authorized representative who has indicated his/her understanding and acceptance.     Plan Discussed with: CRNA, Anesthesiologist and Surgeon  Anesthesia Plan Comments:         Anesthesia Quick Evaluation

## 2013-10-26 NOTE — Anesthesia Procedure Notes (Signed)
Procedure Name: Intubation Date/Time: 10/26/2013 10:04 AM Performed by: Carmela Rima Pre-anesthesia Checklist: Patient identified, Timeout performed, Emergency Drugs available, Suction available and Patient being monitored Patient Re-evaluated:Patient Re-evaluated prior to inductionOxygen Delivery Method: Circle system utilized Preoxygenation: Pre-oxygenation with 100% oxygen Intubation Type: IV induction Ventilation: Mask ventilation without difficulty Laryngoscope Size: Mac and 3 Grade View: Grade I Tube type: Oral Tube size: 7.5 mm Number of attempts: 1 Placement Confirmation: ETT inserted through vocal cords under direct vision,  positive ETCO2 and breath sounds checked- equal and bilateral Secured at: 22 cm Tube secured with: Tape Dental Injury: Teeth and Oropharynx as per pre-operative assessment

## 2013-10-26 NOTE — Transfer of Care (Signed)
Immediate Anesthesia Transfer of Care Note  Patient: [name]  Procedure(s) Performed: Procedure(s): ATTEMPTED  LAPAROSCOPIC CONVERTED TO OPEN CHOLECYSTECTOMY WITH INTRAOPERATIVE CHOLANGIOGRAM; REPAIR OF CHOLECOLONIC FISTULA; cholecystoduodenal fistula repair; lysis of adhesions; drainage of pelvic abscess; omental patching of duodenal repair (N/A)  Patient Location: PACU  Anesthesia Type:General  Level of Consciousness: awake, alert  and oriented  Airway & Oxygen Therapy: Patient Spontanous Breathing and Patient connected to nasal cannula oxygen  Post-op Assessment: Report given to PACU RN, Post -op Vital signs reviewed and stable and Patient moving all extremities X 4  Post vital signs: Reviewed and stable  Complications: No apparent anesthesia complications

## 2013-10-26 NOTE — Preoperative (Signed)
Beta Blockers   Reason not to administer Beta Blockers:Not Applicable 

## 2013-10-26 NOTE — Brief Op Note (Signed)
10/24/2013 - 10/26/2013  1:29 PM  PATIENT:  [name]  78 y.o. female  PRE-OPERATIVE DIAGNOSIS:  Cholelithiasis  POST-OPERATIVE DIAGNOSIS:  Cholelithiasis  PROCEDURE:  Procedure(s): ATTEMPTED  LAPAROSCOPIC CONVERTED TO OPEN CHOLECYSTECTOMY WITH INTRAOPERATIVE CHOLANGIOGRAM; REPAIR OF CHOLECOLONIC FISTULA; cholecystoduodenal fistula repair; lysis of adhesions; drainage of pelvic abscess; omental patching of duodenal repair (N/A)  SURGEON:  Surgeon(s) and Role:    * Yohannes Waibel A. Arsalan Brisbin, MD - Primary    * Ardeth Sportsman, MD - Assisting       ANESTHESIA:   general  EBL:  Total I/O In: 2600 [I.V.:2100; IV Piggyback:500] Out: 250 [Blood:250]  BLOOD ADMINISTERED:none  DRAINS: (19 F) Jackson-Pratt drain(s) with closed bulb suction in the ruq   LOCAL MEDICATIONS USED:  NONE  SPECIMEN:  Source of Specimen:  gallbladder  DISPOSITION OF SPECIMEN:  PATHOLOGY  COUNTS:  YES  TOURNIQUET:  * No tourniquets in log *  DICTATION: .Other Dictation: Dictation Number [id number]  PLAN OF CARE: Admit to inpatient   PATIENT DISPOSITION:  PACU - hemodynamically stable.   Delay start of Pharmacological VTE agent (>24hrs) due to surgical blood loss or risk of bleeding: yes

## 2013-10-26 NOTE — Progress Notes (Signed)
Subjective: Pt in good spirits.  No complaints.  Objective: Vital signs in last 24 hours: Temp:  [98 F (36.7 C)-101.1 F (38.4 C)] 98 F (36.7 C) (11/11 0638) Pulse Rate:  [84-89] 85 (11/11 0638) Resp:  [18-20] 20 (11/11 0638) BP: (107-132)/(56-63) 111/63 mmHg (11/11 0638) SpO2:  [94 %-99 %] 95 % (11/11 0638) Last BM Date: 10/25/13  Intake/Output from previous day: 11/10 0701 - 11/11 0700 In: 2457.1 [I.V.:2107.1; IV Piggyback:350] Out: -  Intake/Output this shift:    no change to abdominal exam from yesterday  Lab Results:   Recent Labs  10/24/13 2055 10/25/13 0620  WBC 15.7* 10.4  HGB 10.9* 9.5*  HCT 31.7* 28.2*  PLT 368 305   BMET  Recent Labs  10/24/13 2055 10/25/13 0620  NA 133* 138  K 3.2* 3.4*  CL 96 105  CO2 24 24  GLUCOSE 131* 107*  BUN 10 6  CREATININE 0.57 0.60  CALCIUM 8.9 8.5   PT/INR No results found for this basename: LABPROT, INR,  in the last 72 hours ABG No results found for this basename: PHART, PCO2, PO2, HCO3,  in the last 72 hours  Studies/Results: Dg Chest 2 View  10/24/2013   CLINICAL DATA:  Weakness for 3-4 weeks, smoking history  EXAM: CHEST  2 VIEW  COMPARISON:  None.  FINDINGS: The heart size and mediastinal contours are normal. Vascular pattern is normal. Lungs are clear except for mild scarring or atelectasis in the left lung base. No pleural effusion.  IMPRESSION: No acute abnormalities.   Electronically Signed   By: Raymond  Rubner M.D.   On: 10/24/2013 21:46   Ct Abdomen Pelvis W Contrast  10/25/2013   CLINICAL DATA:  Weakness.  EXAM: CT ABDOMEN AND PELVIS WITH CONTRAST  TECHNIQUE: Multidetector CT imaging of the abdomen and pelvis was performed using the standard protocol following bolus administration of intravenous contrast.  CONTRAST:  80mL OMNIPAQUE IOHEXOL 300 MG/ML  SOLN  COMPARISON:  CT of the abdomen and pelvis performed 10/07/2013  FINDINGS: Mild bibasilar atelectasis is noted. There is mild bronchiectasis at  the lower lung lobes.  Scattered hepatic cysts are seen, measuring up to 2.6 cm in size. These are grossly unchanged from the recent prior study. The spleen is unremarkable in appearance. There is persistent chronic inflammation of the gallbladder, with stones seen filling the gallbladder. The gallbladder wall currently measures 1.1 cm in diameter, slightly improved from the prior study, though surrounding soft tissue inflammation is noted. The pancreas and adrenal glands are unremarkable in appearance.  The kidneys are unremarkable in appearance. There is no evidence of hydronephrosis. No renal or ureteral stones are seen. No perinephric stranding is appreciated.  No significant free fluid is seen within the abdomen or pelvis. The visualized small bowel is unremarkable in appearance. The stomach is within normal limits. Scattered calcification is noted along the abdominal aorta and its branches. No acute vascular abnormalities are seen.  The appendix is not definitely characterized; there is no evidence for appendicitis. Minimal diverticulosis is noted along the distal descending and sigmoid colon, without evidence of diverticulitis. The colon is otherwise unremarkable in appearance.  The bladder is mildly distended and grossly unremarkable in appearance. The uterus is within normal limits. The previously noted dominant pelvic abscess appears to have resolved; a smaller collection of fluid at the right hemipelvis could simply reflect cysts within the right ovary. Mild chronic soft tissue inflammation is noted within the upper pelvis. No suspicious adnexal masses are seen.   No inguinal lymphadenopathy is appreciated.  No acute osseus abnormalities are identified.  IMPRESSION: 1. No acute abnormality seen to explain the patient's symptoms. 2. Persistent chronic inflammation of the gallbladder, with stones filling the gallbladder and marked gallbladder wall thickening to 1.1 cm. Surrounding soft tissue inflammation  noted. Findings compatible with persistent chronic cholecystitis. 3. Dominant pelvic abscess appears to have resolved; smaller collection of fluid at the right hemipelvis could simply reflect cysts within the right ovary. Mild chronic residual soft tissue inflammation noted within the upper pelvis. 4. Scattered hepatic cysts noted. 5. Scattered calcification along the abdominal aorta and its branches. 6. Minimal diverticulosis along the distal descending and sigmoid colon, without evidence of diverticulitis. 7. Mild bibasilar atelectasis noted; mild bronchiectasis at the lower lung lobes.   Electronically Signed   By: Jeffery  Chang M.D.   On: 10/25/2013 00:00    Anti-infectives: Anti-infectives   Start     Dose/Rate Route Frequency Ordered Stop   10/26/13 0600  ceFAZolin (ANCEF) 3 g in dextrose 5 % 50 mL IVPB     3 g 160 mL/hr over 30 Minutes Intravenous On call to O.R. 10/25/13 1415 10/27/13 0559   10/25/13 2200  vancomycin (VANCOCIN) IVPB 1000 mg/200 mL premix     1,000 mg 200 mL/hr over 60 Minutes Intravenous Every 24 hours 10/24/13 2219     10/25/13 0800  piperacillin-tazobactam (ZOSYN) IVPB 3.375 g     3.375 g 12.5 mL/hr over 240 Minutes Intravenous Every 8 hours 10/24/13 2218     10/24/13 2200  piperacillin-tazobactam (ZOSYN) IVPB 3.375 g     3.375 g 100 mL/hr over 30 Minutes Intravenous  Once 10/24/13 2156 10/24/13 2332   10/24/13 2200  vancomycin (VANCOCIN) IVPB 1000 mg/200 mL premix     1,000 mg 200 mL/hr over 60 Minutes Intravenous  Once 10/24/13 2156 10/25/13 0056      Assessment/Plan: Patient Active Problem List   Diagnosis Date Noted  . Severe protein-calorie malnutrition 10/09/2013    Class: Chronic  . Tobacco use disorder 10/09/2013    Class: Chronic  . Diarrhea 10/09/2013    Class: Acute  . Pericolonic abscess due to diverticulitis 10/06/2013  . Diverticular disease small and large intestine, perforation, abscess 10/01/2013  . Calculus of gallbladder with acute  cholecystitis, without mention of obstruction 10/01/2013  . Loss of weight 10/01/2013  . Goiter 10/01/2013  OR today for lap / open cholecystectomy possible cholangiogram.  The procedure has been discussed with the patient. Operative and non operative treatments have been discussed. Risks of surgery include bleeding, infection,  Common bile duct injury,  Injury to the stomach,liver, colon,small intestine, abdominal wall,  Diaphragm,  Major blood vessels,  And the need for an open procedure.  Other risks include worsening of medical problems, death,  DVT and pulmonary embolism, and cardiovascular events.   Medical options have also been discussed. The patient has been informed of long term expectations of surgery and non surgical options,  The patient agrees to proceed.    LOS: 2 days    Sawyer Kahan A. 10/26/2013  

## 2013-10-27 ENCOUNTER — Encounter (HOSPITAL_COMMUNITY): Payer: Self-pay | Admitting: Surgery

## 2013-10-27 DIAGNOSIS — E43 Unspecified severe protein-calorie malnutrition: Secondary | ICD-10-CM

## 2013-10-27 LAB — COMPREHENSIVE METABOLIC PANEL
ALT: 30 U/L (ref 0–35)
AST: 37 U/L (ref 0–37)
Albumin: 2.2 g/dL — ABNORMAL LOW (ref 3.5–5.2)
Alkaline Phosphatase: 145 U/L — ABNORMAL HIGH (ref 39–117)
BUN: 6 mg/dL (ref 6–23)
CO2: 23 mEq/L (ref 19–32)
Calcium: 8.1 mg/dL — ABNORMAL LOW (ref 8.4–10.5)
Chloride: 104 mEq/L (ref 96–112)
Creatinine, Ser: 0.69 mg/dL (ref 0.50–1.10)
GFR calc Af Amer: >90 mL/min (ref >90)
GFR calc non Af Amer: 82 mL/min — ABNORMAL LOW (ref >90)
Glucose, Bld: 114 mg/dL — ABNORMAL HIGH (ref 70–99)
Potassium: 4.6 mEq/L (ref 3.5–5.1)
Sodium: 136 mEq/L (ref 135–145)
Total Bilirubin: 0.7 mg/dL (ref 0.3–1.2)
Total Protein: 5 g/dL — ABNORMAL LOW (ref 6.0–8.3)

## 2013-10-27 LAB — GLUCOSE, CAPILLARY: Glucose-Capillary: 118 mg/dL — ABNORMAL HIGH (ref 70–99)

## 2013-10-27 LAB — CBC
HCT: 26.4 % — ABNORMAL LOW (ref 36.0–46.0)
Hemoglobin: 8.8 g/dL — ABNORMAL LOW (ref 12.0–15.0)
MCH: 30.1 pg (ref 26.0–34.0)
MCHC: 33.3 g/dL (ref 30.0–36.0)
MCV: 90.4 fL (ref 78.0–100.0)
Platelets: 257 10*3/uL (ref 150–400)
RBC: 2.92 MIL/uL — ABNORMAL LOW (ref 3.87–5.11)
RDW: 18.5 % — ABNORMAL HIGH (ref 11.5–15.5)
WBC: 18.4 10*3/uL — ABNORMAL HIGH (ref 4.0–10.5)

## 2013-10-27 MED ORDER — FAT EMULSION 20 % IV EMUL
250.0000 mL | INTRAVENOUS | Status: AC
  Administered 2013-10-27: 250 mL via INTRAVENOUS
  Filled 2013-10-27: qty 250

## 2013-10-27 MED ORDER — POTASSIUM CHLORIDE IN NACL 20-0.9 MEQ/L-% IV SOLN
INTRAVENOUS | Status: AC
  Administered 2013-10-28 – 2013-10-29 (×2): via INTRAVENOUS
  Filled 2013-10-27 (×5): qty 1000

## 2013-10-27 MED ORDER — CHLORHEXIDINE GLUCONATE 0.12 % MT SOLN
15.0000 mL | Freq: Two times a day (BID) | OROMUCOSAL | Status: DC
  Administered 2013-10-27 – 2013-11-02 (×11): 15 mL via OROMUCOSAL
  Filled 2013-10-27 (×12): qty 15

## 2013-10-27 MED ORDER — SODIUM CHLORIDE 0.9 % IJ SOLN
10.0000 mL | Freq: Two times a day (BID) | INTRAMUSCULAR | Status: DC
  Administered 2013-10-27 – 2013-11-02 (×8): 10 mL

## 2013-10-27 MED ORDER — TRACE MINERALS CR-CU-F-FE-I-MN-MO-SE-ZN IV SOLN
INTRAVENOUS | Status: AC
  Administered 2013-10-27: 19:00:00 via INTRAVENOUS
  Filled 2013-10-27: qty 1000

## 2013-10-27 MED ORDER — SODIUM CHLORIDE 0.9 % IJ SOLN
10.0000 mL | INTRAMUSCULAR | Status: DC | PRN
  Administered 2013-10-28 – 2013-11-07 (×8): 10 mL
  Administered 2013-11-08: 20 mL

## 2013-10-27 MED ORDER — INSULIN ASPART 100 UNIT/ML ~~LOC~~ SOLN
0.0000 [IU] | Freq: Four times a day (QID) | SUBCUTANEOUS | Status: DC
  Administered 2013-10-28 – 2013-10-30 (×5): 1 [IU] via SUBCUTANEOUS

## 2013-10-27 NOTE — Progress Notes (Signed)
NUTRITION CONSULT/FOLLOW UP  DOCUMENTATION CODES Per approved criteria  -Severe malnutrition in the context of chronic illness -Underweight   Intervention:    TPN per pharmacy RD to follow for nutrition care plan  Nutrition Dx:   Inadequate oral intake now related to altered GI function, s/p surgical procedures as evidenced by NPO status, ongoing  Goal:   TPN to meet > 90% of estimated nutrition needs, progressing  Monitor:   TPN prescription, PO diet advancement & intake, weight, labs, I/O's  Assessment:   Patient with recent history of abdominal abscess and chronic cholecystitis; + nausea, vomiting and progressive weakness PTA; in ED found to be febrile with leukocytosis and tachycardia; IVF and ABX initiated; surgery consulted and patient admitted for further management.  Patient s/p procedures 11/11: LYSIS OF ADHESIONS REPAIR OF CHOLECOLONIC FISTULA DRAINAGE OF PELVIC ABSCESS REPAIR OF CHOLECYSTODUODENAL FISTULA  PICC line to be placed today.  Patient with NGT to LIS in place.  RD consulted for new TPN.  Patient to receive TPN with Clinimix E 5/15 @ 25 ml/hr and lipids @ 10 ml/hr. Provides 906 kcal and 30 grams protein per day.  Meets 65% minimum estimated energy needs and 37% minimum estimated protein needs.  Patient at risk for refeeding syndrome with TPN initiation & advancement given malnutrition.  Height: Ht Readings from Last 1 Encounters:  10/25/13 5\' 3"  (1.6 m)    Weight Status:   Wt Readings from Last 1 Encounters:  10/25/13 93 lb 4.1 oz (42.3 kg)    Body mass index is 16.52 kg/(m^2).  Re-estimated needs:  Kcal: 1400-1600 Protein: 80-90 gm Fluid: >/= 1.5 L  Skin: abdominal surgical incision   Diet Order: NPO   Intake/Output Summary (Last 24 hours) at 10/27/13 1110 Last data filed at 10/27/13 1000  Gross per 24 hour  Intake 3494.5 ml  Output   1520 ml  Net 1974.5 ml    Labs:   Recent Labs Lab 10/24/13 2055 10/25/13 0620  10/26/13 1255 10/26/13 2357  NA 133* 138 136 136  K 3.2* 3.4* 4.2 4.6  CL 96 105  --  104  CO2 24 24  --  23  BUN 10 6  --  6  CREATININE 0.57 0.60  --  0.69  CALCIUM 8.9 8.5  --  8.1*  GLUCOSE 131* 107* 146* 114*    Scheduled Meds: . chlorhexidine  15 mL Mouth/Throat BID  . enoxaparin (LOVENOX) injection  30 mg Subcutaneous Q24H  . HYDROmorphone PCA 0.3 mg/mL   Intravenous Q4H  . [START ON 10/28/2013] insulin aspart  0-9 Units Subcutaneous Q6H  . piperacillin-tazobactam  3.375 g Intravenous Q8H    Continuous Infusions: . Marland KitchenTPN (CLINIMIX-E) Adult     And  . fat emulsion    . 0.9 % NaCl with KCl 20 mEq / L 125 mL/hr at 10/27/13 0700  . 0.9 % NaCl with KCl 20 mEq / [place] Dura, RD, LDN Pager #: [phone] After-Hours Pager #: [phone]

## 2013-10-27 NOTE — Progress Notes (Signed)
Subjective: Doing well POD 1. Pain is being controlled. Resp status is better than expected. Mentating well.   Objective: Vital signs in last 24 hours: Temp:  [97.4 F (36.3 C)-98.6 F (37 C)] 97.9 F (36.6 C) (11/12 0753) Pulse Rate:  [83-121] 87 (11/12 0700) Resp:  [8-20] 10 (11/12 0700) BP: (73-136)/(45-73) 107/54 mmHg (11/12 0700) SpO2:  [97 %-100 %] 100 % (11/12 0700)  Intake/Output from previous day: 11/11 0701 - 11/12 0700 In: 4705 [I.V.:4175; NG/GT:30; IV Piggyback:500] Out: 1500 [Urine:1010; Emesis/NG output:20; Drains:220; Blood:250] Intake/Output this shift:    Lying flat. Face symmetric, lungs clear no wheeze. Ht regular, sl fast. Large dssg in place on abd. Distal pulses OK  Lab Results   Recent Labs  10/26/13 1820 10/26/13 2357  WBC 19.0* 18.4*  RBC 3.03* 2.92*  HGB 9.2* 8.8*  HCT 27.6* 26.4*  MCV 91.1 90.4  MCH 30.4 30.1  RDW 17.9* 18.5*  PLT 265 257    Recent Labs  10/25/13 0620 10/26/13 1255 10/26/13 2357  NA 138 136 136  K 3.4* 4.2 4.6  CL 105  --  104  CO2 24  --  23  GLUCOSE 107* 146* 114*  BUN 6  --  6  CREATININE 0.60  --  0.69  CALCIUM 8.5  --  8.1*    Studies/Results: Dg Cholangiogram Operative  10/26/2013   CLINICAL DATA:  Cholecystectomy  EXAM: INTRAOPERATIVE CHOLANGIOGRAM  TECHNIQUE: Cholangiographic images from the C-arm fluoroscopic device were submitted for interpretation post-operatively. Please see the procedural report for the amount of contrast and the fluoroscopy time utilized.  COMPARISON:  None.  FINDINGS: No persistent filling defects in the common duct. Intrahepatic ducts are incompletely visualized, appearing decompressed centrally. Contrast passes into the duodenum.  : Negative for retained common duct stone.   Electronically Signed   By: Oley Balm M.D.   On: 10/26/2013 13:00    Scheduled Meds: . enoxaparin (LOVENOX) injection  30 mg Subcutaneous Q24H  . HYDROmorphone PCA 0.3 mg/mL   Intravenous Q4H  .  piperacillin-tazobactam  3.375 g Intravenous Q8H  . vancomycin  1,000 mg Intravenous Q24H   Continuous Infusions: . 0.9 % NaCl with KCl 20 mEq / L 125 mL/hr at 10/27/13 0700   PRN Meds:diphenhydrAMINE, diphenhydrAMINE, naloxone, ondansetron (ZOFRAN) IV, ondansetron (ZOFRAN) IV, ondansetron, sodium chloride, zolpidem  Assessment/Plan: S/P CHOLECYSTECTOMY: doing well. Pain is controlled HYPERTENSION: BP a bit soft HYPOTHYROID: should do OK for a couple of days MALNUTRITION: planned TNA  HYPOKALEMIA: Doing OK ANEMIA: got blood in PACU. Relatively stable  LOS: 3 days   [name] 10/27/2013, 8:31 AM

## 2013-10-27 NOTE — Progress Notes (Signed)
Looks great.  UGI Friday.  To floor thursday

## 2013-10-27 NOTE — Progress Notes (Signed)
PARENTERAL NUTRITION CONSULT NOTE - INITIAL  Pharmacy Consult for TPN Indication: "poor nutrition, recent surgery, delayed po intake"  Allergies  Allergen Reactions  . Cymbalta [Duloxetine Hcl] Other (See Comments)    "kinda went nuts"  . Halcion [Triazolam] Other (See Comments)    "kinda went nuts"    Patient Measurements: Height: 5\' 3"  (160 cm) Weight: 93 lb 4.1 oz (42.3 kg) IBW/kg (Calculated) : 52.4  Vital Signs: Temp: 97.9 F (36.6 C) (11/12 0753) Temp src: Oral (11/12 0753) BP: 113/49 mmHg (11/12 0900) Pulse Rate: 88 (11/12 0900) Intake/Output from previous day: 11/11 0701 - 11/12 0700 In: 4705 [I.V.:4175; NG/GT:30; IV Piggyback:500] Out: 1500 [Urine:1010; Emesis/NG output:20; Drains:220; Blood:250] Intake/Output from this shift:    Labs:  Recent Labs  10/25/13 0620 10/26/13 1255 10/26/13 1820 10/26/13 2357  WBC 10.4  --  19.0* 18.4*  HGB 9.5* 8.5* 9.2* 8.8*  HCT 28.2* 25.0* 27.6* 26.4*  PLT 305  --  265 257     Recent Labs  10/24/13 2055 10/25/13 0620 10/26/13 1255 10/26/13 2357  NA 133* 138 136 136  K 3.2* 3.4* 4.2 4.6  CL 96 105  --  104  CO2 24 24  --  23  GLUCOSE 131* 107* 146* 114*  BUN 10 6  --  6  CREATININE 0.57 0.60  --  0.69  CALCIUM 8.9 8.5  --  8.1*  PROT 6.8 5.8*  --  5.0*  ALBUMIN 2.7* 2.2*  --  2.2*  AST 46* 29  --  37  ALT 78* 54*  --  30  ALKPHOS 403* 307*  --  145*  BILITOT 0.7 0.6  --  0.7   Estimated Creatinine Clearance: 40 ml/min (by C-G formula based on Cr of 0.69).   No results found for this basename: GLUCAP,  in the last 72 hours  Medical History: Past Medical History  Diagnosis Date  . Hypothyroidism   . Hyperlipemia   . Hypertension     "was taking RX; took me off it 09/2013" (10/25/2013)  . Seasonal allergies   . History of diverticular abscess 09/2013  . Depression     "sold beach house 01/2012" (10/25/2013)    Medications:  Prescriptions prior to admission  Medication Sig Dispense Refill  .  busPIRone (BUSPAR) 10 MG tablet Take 10 mg by mouth 2 (two) times daily.      . ciprofloxacin (CIPRO) 500 MG tablet Take 1 tablet (500 mg total) by mouth 2 (two) times daily.  12 tablet  0  . ezetimibe-simvastatin (VYTORIN) 10-80 MG per tablet Take 1 tablet by mouth at bedtime.      . feeding supplement, ENSURE COMPLETE, (ENSURE COMPLETE) LIQD Take 237 mLs by mouth 2 (two) times daily between meals.  60 Bottle  12  . levothyroxine (SYNTHROID, LEVOTHROID) 75 MCG tablet Take 75 mcg by mouth daily before breakfast.      . loratadine (CLARITIN) 10 MG tablet Take 10 mg by mouth daily.      Marland Kitchen losartan (COZAAR) 50 MG tablet Take 50 mg by mouth daily.       . Multiple Vitamin (MULTIVITAMIN WITH MINERALS) TABS tablet Take 1 tablet by mouth daily.  100 tablet  12  . potassium chloride (KLOR-CON) 20 MEQ packet Take 20 mEq by mouth daily.      . promethazine (PHENERGAN) 25 MG tablet Take 25 mg by mouth every 6 (six) hours as needed for nausea or vomiting.      . zaleplon (  SONATA) 10 MG capsule Take 10 mg by mouth at bedtime.        Insulin Requirements in the past 24 hours:  none  Current Nutrition:  None, strict NPO  Assessment: [name] is a pleasant 75 wf POD # 1 s/p laparoscopic converted to open cholecystectomy with repair of choloduodenal fistula with repair of duodenotomy to start TPN per pharmacy consult today for nutrition support.  Nutritional Goals:  f/u with unit RD  GI: POD # 1, no BS, NGT w/ minimal bilious output, CCS plans to look at UGI on POD #3 to determine no leak and take out NG then; for now strict NPO Glucose: no hx DM,  Lytes: K 4.6,  Renal: creat 0.69, UOP 0.9 ml/kg/hr  Plan:  1. Start Clinimix E 5/15 at 25 ml/hr plus lipids at 10 ml/hr to provide 30 gm protein and 906 kcals and assess tolerance 2. Goal rate is Clinimix E 5/15 at 50 ml/hr plus lipids 3 days a week, MWF at 10 ml/hr to provide a weekly daily average of 60 gm protein and 1058 kcals. 3. Empiric SSI to  cover dextrose load of TPN, will DC if not needed 4. Am CMET, mag, phos, trig, prealbumin,  5. F/u with unit RD for goals 6. Decrease NS with 20 K from 125 ml/hr to 90 ml/hr tonight when TPN started Herby Abraham, Pharm.D. [phone] 10/27/2013 10:12 AM

## 2013-10-27 NOTE — Progress Notes (Signed)
Chaplain offered spiritual and emotional support to pt and family member. Pt had surgery to remove gall bladder. She said she does not need chaplain support at this time.   Maurene Capes, [place] [phone]

## 2013-10-27 NOTE — Plan of Care (Signed)
Problem: Phase I Progression Outcomes Goal: Initial discharge plan identified Outcome: Completed/Met Date Met:  10/27/13 Home with husband

## 2013-10-27 NOTE — Progress Notes (Signed)
Peripherally Inserted Central Catheter/Midline Placement  The IV Nurse has discussed with the patient and/or persons authorized to consent for the patient, the purpose of this procedure and the potential benefits and risks involved with this procedure.  The benefits include less needle sticks, lab draws from the catheter and patient may be discharged home with the catheter.  Risks include, but not limited to, infection, bleeding, blood clot (thrombus formation), and puncture of an artery; nerve damage and irregular heat beat.  Alternatives to this procedure were also discussed.  PICC/Midline Placement Documentation  PICC / Midline Double Lumen 10/27/13 PICC Left Brachial 39 cm 1 cm (Active)  Indication for Insertion or Continuance of Line Administration of hyperosmolar/irritating solutions (i.e. TPN, Vancomycin, etc.) 10/27/2013  6:00 PM  Exposed Catheter (cm) 1 cm 10/27/2013  6:00 PM  Dressing Change Due 11/03/13 10/27/2013  6:00 PM       Stacie Glaze Horton 10/27/2013, 6:07 PM

## 2013-10-27 NOTE — Progress Notes (Signed)
Patient ID: [name], female   DOB: [birth year], 78 y.o.   MRN: [id number] 1 Day Post-Op  Subjective: Pt feels ok.  Having some pain, but well controlled for now.    Objective: Vital signs in last 24 hours: Temp:  [97.4 F (36.3 C)-98.2 F (36.8 C)] 97.9 F (36.6 C) (11/12 0753) Pulse Rate:  [83-121] 87 (11/12 0700) Resp:  [8-20] 10 (11/12 0700) BP: (73-136)/(45-73) 107/54 mmHg (11/12 0700) SpO2:  [97 %-100 %] 100 % (11/12 0700) Last BM Date: 10/25/13  Intake/Output from previous day: 11/11 0701 - 11/12 0700 In: 4705 [I.V.:4175; NG/GT:30; IV Piggyback:500] Out: 1500 [Urine:1010; Emesis/NG output:20; Drains:220; Blood:250] Intake/Output this shift:    PE: Abd: soft, huge abdominal dressing present that is clean with no evidence of drainage.  JP with serosang output.  No bile seen.  No BS.  NGT in place with minimal bilious output Heart: regular  Lab Results:   Recent Labs  10/26/13 1820 10/26/13 2357  WBC 19.0* 18.4*  HGB 9.2* 8.8*  HCT 27.6* 26.4*  PLT 265 257   BMET  Recent Labs  10/25/13 0620 10/26/13 1255 10/26/13 2357  NA 138 136 136  K 3.4* 4.2 4.6  CL 105  --  104  CO2 24  --  23  GLUCOSE 107* 146* 114*  BUN 6  --  6  CREATININE 0.60  --  0.69  CALCIUM 8.5  --  8.1*   PT/INR No results found for this basename: LABPROT, INR,  in the last 72 hours CMP     Component Value Date/Time   NA 136 10/26/2013 2357   K 4.6 10/26/2013 2357   CL 104 10/26/2013 2357   CO2 23 10/26/2013 2357   GLUCOSE 114* 10/26/2013 2357   BUN 6 10/26/2013 2357   CREATININE 0.69 10/26/2013 2357   CALCIUM 8.1* 10/26/2013 2357   PROT 5.0* 10/26/2013 2357   ALBUMIN 2.2* 10/26/2013 2357   AST 37 10/26/2013 2357   ALT 30 10/26/2013 2357   ALKPHOS 145* 10/26/2013 2357   BILITOT 0.7 10/26/2013 2357   GFRNONAA 82* 10/26/2013 2357   GFRAA >90 10/26/2013 2357   Lipase  No results found for this basename: lipase       Studies/Results: Dg Cholangiogram  Operative  10/26/2013   CLINICAL DATA:  Cholecystectomy  EXAM: INTRAOPERATIVE CHOLANGIOGRAM  TECHNIQUE: Cholangiographic images from the C-arm fluoroscopic device were submitted for interpretation post-operatively. Please see the procedural report for the amount of contrast and the fluoroscopy time utilized.  COMPARISON:  None.  FINDINGS: No persistent filling defects in the common duct. Intrahepatic ducts are incompletely visualized, appearing decompressed centrally. Contrast passes into the duodenum.  : Negative for retained common duct stone.   Electronically Signed   By: Oley Balm M.D.   On: 10/26/2013 13:00    Anti-infectives: Anti-infectives   Start     Dose/Rate Route Frequency Ordered Stop   10/26/13 0600  ceFAZolin (ANCEF) 3 g in dextrose 5 % 50 mL IVPB     3 g 160 mL/hr over 30 Minutes Intravenous On call to O.R. 10/25/13 1415 10/26/13 0830   10/25/13 2200  vancomycin (VANCOCIN) IVPB 1000 mg/200 mL premix     1,000 mg 200 mL/hr over 60 Minutes Intravenous Every 24 hours 10/24/13 2219     10/25/13 0800  piperacillin-tazobactam (ZOSYN) IVPB 3.375 g     3.375 g 12.5 mL/hr over 240 Minutes Intravenous Every 8 hours 10/24/13 2218     10/24/13 2200  piperacillin-tazobactam (ZOSYN) IVPB 3.375 g     3.375 g 100 mL/hr over 30 Minutes Intravenous  Once 10/24/13 2156 10/24/13 2332   10/24/13 2200  vancomycin (VANCOCIN) IVPB 1000 mg/200 mL premix     1,000 mg 200 mL/hr over 60 Minutes Intravenous  Once 10/24/13 2156 10/25/13 0056       Assessment/Plan  1. POD1, laparoscopic converted to open cholecystectomy with repair of choloduodenal fistula with repair of duodenotomy 2. PCM/TNA 3. Hypothyroidism 4. Mild hypotension  Plan: 1. Will work on [employer] and getting OOB to chair today 2. Cont NGT. Will look at UGI on POD3 to determine no leak and see if we can get her NGT out then.  For now strict NPO. 3. Follow labs 4. JP to remain in place 5. Will remove dressing  tomorrow 6. Appreciate Dr. Rinaldo Cloud assistance with this patient.  We will take over patient's care. 7. He is ok with holding off on her thyroid meds for a couple of days until she can take PO. 8. Will insert a PICC today and start TNA due to prolonged poor PO intake and delayed oral intake after surgery 9. Cont abx therapy. Stop Vanc.  Zosyn Day 3/10, will give a total of 7 days after surgery.  First day is today. 8. Due to some hypotension, we will monitor in the ICU for another day.    LOS: 3 days    [name] E 10/27/2013, 9:13 AM Pager: [phone]

## 2013-10-28 LAB — CBC
HCT: 19.9 % — ABNORMAL LOW (ref 36.0–46.0)
HCT: 28.3 % — ABNORMAL LOW (ref 36.0–46.0)
Hemoglobin: 6.8 g/dL — CL (ref 12.0–15.0)
Hemoglobin: 9.6 g/dL — ABNORMAL LOW (ref 12.0–15.0)
MCH: 30.4 pg (ref 26.0–34.0)
MCH: 30.9 pg (ref 26.0–34.0)
MCHC: 34.2 g/dL (ref 30.0–36.0)
MCHC: 33.9 g/dL (ref 30.0–36.0)
MCV: 88.8 fL (ref 78.0–100.0)
MCV: 91 fL (ref 78.0–100.0)
Platelets: 252 10*3/uL (ref 150–400)
Platelets: 255 10*3/uL (ref 150–400)
RBC: 2.24 MIL/uL — ABNORMAL LOW (ref 3.87–5.11)
RBC: 3.11 MIL/uL — ABNORMAL LOW (ref 3.87–5.11)
RDW: 18.1 % — ABNORMAL HIGH (ref 11.5–15.5)
RDW: 16.2 % — ABNORMAL HIGH (ref 11.5–15.5)
WBC: 14.3 10*3/uL — ABNORMAL HIGH (ref 4.0–10.5)
WBC: 16.6 10*3/uL — ABNORMAL HIGH (ref 4.0–10.5)

## 2013-10-28 LAB — COMPREHENSIVE METABOLIC PANEL
ALT: 21 U/L (ref 0–35)
AST: 25 U/L (ref 0–37)
Albumin: 1.9 g/dL — ABNORMAL LOW (ref 3.5–5.2)
Alkaline Phosphatase: 117 U/L (ref 39–117)
BUN: 6 mg/dL (ref 6–23)
CO2: 26 mEq/L (ref 19–32)
Calcium: 8 mg/dL — ABNORMAL LOW (ref 8.4–10.5)
Chloride: 102 mEq/L (ref 96–112)
Creatinine, Ser: 0.49 mg/dL — ABNORMAL LOW (ref 0.50–1.10)
GFR calc Af Amer: >90 mL/min (ref >90)
GFR calc non Af Amer: >90 mL/min (ref >90)
Glucose, Bld: 143 mg/dL — ABNORMAL HIGH (ref 70–99)
Potassium: 2.8 mEq/L — ABNORMAL LOW (ref 3.5–5.1)
Sodium: 136 mEq/L (ref 135–145)
Total Bilirubin: 0.4 mg/dL (ref 0.3–1.2)
Total Protein: 4.9 g/dL — ABNORMAL LOW (ref 6.0–8.3)

## 2013-10-28 LAB — GLUCOSE, CAPILLARY
Glucose-Capillary: 144 mg/dL — ABNORMAL HIGH (ref 70–99)
Glucose-Capillary: 106 mg/dL — ABNORMAL HIGH (ref 70–99)

## 2013-10-28 LAB — BASIC METABOLIC PANEL
BUN: 5 mg/dL — ABNORMAL LOW (ref 6–23)
CO2: 26 mEq/L (ref 19–32)
Calcium: 8.4 mg/dL (ref 8.4–10.5)
Chloride: 102 mEq/L (ref 96–112)
Creatinine, Ser: 0.46 mg/dL — ABNORMAL LOW (ref 0.50–1.10)
GFR calc Af Amer: >90 mL/min (ref >90)
GFR calc non Af Amer: >90 mL/min (ref >90)
Glucose, Bld: 118 mg/dL — ABNORMAL HIGH (ref 70–99)
Potassium: 3.8 mEq/L (ref 3.5–5.1)
Sodium: 135 mEq/L (ref 135–145)

## 2013-10-28 LAB — MAGNESIUM: Magnesium: 1.4 mg/dL — ABNORMAL LOW (ref 1.5–2.5)

## 2013-10-28 LAB — PREPARE RBC (CROSSMATCH)

## 2013-10-28 LAB — PHOSPHORUS: Phosphorus: 2.5 mg/dL (ref 2.3–4.6)

## 2013-10-28 LAB — PREALBUMIN: Prealbumin: 4.9 mg/dL — ABNORMAL LOW (ref 17.0–34.0)

## 2013-10-28 LAB — TRIGLYCERIDES: Triglycerides: 200 mg/dL — ABNORMAL HIGH (ref <150)

## 2013-10-28 MED ORDER — FAT EMULSION 20 % IV EMUL
250.0000 mL | INTRAVENOUS | Status: AC
  Administered 2013-10-28: 250 mL via INTRAVENOUS
  Filled 2013-10-28: qty 250

## 2013-10-28 MED ORDER — MAGNESIUM SULFATE 40 MG/ML IJ SOLN
2.0000 g | Freq: Once | INTRAMUSCULAR | Status: AC
  Administered 2013-10-28: 2 g via INTRAVENOUS
  Filled 2013-10-28: qty 50

## 2013-10-28 MED ORDER — PANTOPRAZOLE SODIUM 40 MG IV SOLR
40.0000 mg | INTRAVENOUS | Status: DC
  Administered 2013-10-28 – 2013-11-05 (×9): 40 mg via INTRAVENOUS
  Filled 2013-10-28 (×11): qty 40

## 2013-10-28 MED ORDER — POTASSIUM CHLORIDE 10 MEQ/50ML IV SOLN
10.0000 meq | INTRAVENOUS | Status: AC
  Administered 2013-10-28 (×2): 10 meq via INTRAVENOUS
  Filled 2013-10-28: qty 50

## 2013-10-28 MED ORDER — TRACE MINERALS CR-CU-F-FE-I-MN-MO-SE-ZN IV SOLN
INTRAVENOUS | Status: AC
  Administered 2013-10-28: 18:00:00 via INTRAVENOUS
  Filled 2013-10-28: qty 1000

## 2013-10-28 MED ORDER — POTASSIUM CHLORIDE 10 MEQ/50ML IV SOLN
10.0000 meq | INTRAVENOUS | Status: DC
  Filled 2013-10-28: qty 50

## 2013-10-28 MED ORDER — POTASSIUM CHLORIDE 10 MEQ/100ML IV SOLN
10.0000 meq | INTRAVENOUS | Status: DC
  Administered 2013-10-28 (×2): 10 meq via INTRAVENOUS
  Filled 2013-10-28 (×2): qty 100

## 2013-10-28 NOTE — Progress Notes (Signed)
Patient woke up confused and accidentally pulled out NG tube.  Dr. Luisa Hart notified of incident and order received to leave out at this time.  Patient's husband called and notified of tube coming out as well.

## 2013-10-28 NOTE — Progress Notes (Signed)
eLink Physician-Brief Progress Note Patient Name: [name] DOB: Dec 27, 1934 MRN: [id number]  Date of Service  10/28/2013   HPI/Events of Note     eICU Interventions  Hypokalemia -repleted hypomag-repleted   Intervention Category Intermediate Interventions: Electrolyte abnormality - evaluation and management  Hamid Brookens V. 10/28/2013, 5:45 AM

## 2013-10-28 NOTE — Progress Notes (Addendum)
PARENTERAL NUTRITION CONSULT NOTE - Follow-up  Pharmacy Consult for TPN Indication: prolonged poor po intake  Allergies  Allergen Reactions  . Cymbalta [Duloxetine Hcl] Other (See Comments)    "kinda went nuts"  . Halcion [Triazolam] Other (See Comments)    "kinda went nuts"    Patient Measurements: Height: 5\' 3"  (160 cm) Weight: 93 lb 4.1 oz (42.3 kg) IBW/kg (Calculated) : 52.4  Vital Signs: Temp: 97.9 F (36.6 C) (11/13 0459) Temp src: Oral (11/13 0459) BP: 106/47 mmHg (11/13 0600) Pulse Rate: 79 (11/13 0600) Intake/Output from previous day: 11/12 0701 - 11/13 0700 In: 1512.2 [I.V.:813.8; IV Piggyback:300; TPN:398.4] Out: 1945 [Urine:875; Emesis/NG output:1000; Drains:70] Intake/Output from this shift:    Labs:  Recent Labs  10/26/13 1820 10/26/13 2357 10/28/13 0415  WBC 19.0* 18.4* 14.3*  HGB 9.2* 8.8* 6.8*  HCT 27.6* 26.4* 19.9*  PLT 265 257 252     Recent Labs  10/26/13 1255 10/26/13 2357 10/28/13 0415  NA 136 136 136  K 4.2 4.6 2.8*  CL  --  104 102  CO2  --  23 26  GLUCOSE 146* 114* 143*  BUN  --  6 6  CREATININE  --  0.69 0.49*  CALCIUM  --  8.1* 8.0*  MG  --   --  1.4*  PHOS  --   --  2.5  PROT  --  5.0* 4.9*  ALBUMIN  --  2.2* 1.9*  AST  --  37 25  ALT  --  30 21  ALKPHOS  --  145* 117  BILITOT  --  0.7 0.4  TRIG  --   --  200*   Estimated Creatinine Clearance: 40 ml/min (by C-G formula based on Cr of 0.49).    Recent Labs  10/27/13 2342 10/28/13 0538  GLUCAP 118* 144*    Insulin Requirements in the past 12 hours:  1 unit SSI  Current Nutrition:  Clinimix at 25 ml/hr and lipids at 53ml/hr Goal: Clinimix 5/15 at 70 ml/hr and lipids at 8 ml/hr will provide 84 gm protein and 1577 kcal  Nutritional Goals:  1400-1600 kcal; 80-90 gm protein; fluid >/= 1.5 L  Assessment: [name] is a pleasant 29 wf POD # 2 s/p laparoscopic converted to open cholecystectomy with repair of choloduodenal fistula with repair of duodenotomy.  Pt started on TPN due to prolonged poor po intake.  GI: POD # 2, no BS, NGT w/ minimal bilious output, CCS plans to look at UGI on POD #3 to determine no leak and take out NG then; for now strict NPO. NGT 1000 ml output past 24 hours.  Endo: No hx DM. CBGs well controlled thus far.  Lytes: K 2.8 - MD replaced with IV; pt also with K in MIVF, Mg 1.4 - MD replaced with 2gm IV, Phos 2.5. Appears pt with some amount of refeeding syndrome. Pt at risk for refeeding syndrome due to malnutrition, poor po intake PTA.  Renal: SCr stable. UOP 0.9 ml/kg/hr. Neg 0.4L yesterday. Pt continues on NS with KCl at 90 ml/hr.  Pulm: 1L Jane  Cards: BP soft. HR ok.  Hepatobil: LFTs wnl. TG 200 at baseline.  Neuro: A&O  ID:  Zosyn D#4/10 for diverticulitis. Afeb. Wbc elevated but trending down.  Best Practices: Enoxaparin  TPN Access: PICC  TPN day#:1   Plan:  1. Continue Clinimix E 5/15 at 25 ml/hr plus lipids at 8 ml/hr. Will not advance to goal until lytes stabilize. 2. F/u  BMET, Mg, Phos in a.m. 3. Continue CBGs/SSI at least until pt at goal rate  Christoper Fabian, PharmD, BCPS Clinical pharmacist, pager [phone] 10/28/2013 7:29 AM

## 2013-10-28 NOTE — Progress Notes (Signed)
CRITICAL VALUE ALERT  Critical value received:  Hgb 6.8  Date of notification:  10/28/2013  Time of notification:  0545  Critical value read back:yes  Nurse who received alert:  Anthoney Harada, RN  MD notified (1st page):  CCS  Time of first page:  [phone]  MD notified (2nd page):  Time of second page:  Responding MD:  Dr. Dwain Sarna  Time MD responded:  [phone]

## 2013-10-28 NOTE — Progress Notes (Addendum)
2 Days Post-Op  Subjective: DOING OK.  HGB feel to 6.8 but drawn from PICC. Pain controlled.   Objective: Vital signs in last 24 hours: Temp:  [97.7 F (36.5 C)-98.5 F (36.9 C)] 98.5 F (36.9 C) (11/13 0742) Pulse Rate:  [79-99] 86 (11/13 0745) Resp:  [9-24] 15 (11/13 0745) BP: (92-133)/(25-96) 118/58 mmHg (11/13 0742) SpO2:  [53 %-100 %] 100 % (11/13 0745) Last BM Date: 10/25/13  Intake/Output from previous day: 11/12 0701 - 11/13 0700 In: 1512.2 [I.V.:813.8; IV Piggyback:300; TPN:398.4] Out: 1945 [Urine:875; Emesis/NG output:1000; Drains:70] Intake/Output this shift: Total I/O In: 13 [Blood:13] Out: -   Incision/Wound:open and clean.  No distension.  Drain clear / serosanguinous  Lab Results:   Recent Labs  10/26/13 2357 10/28/13 0415  WBC 18.4* 14.3*  HGB 8.8* 6.8*  HCT 26.4* 19.9*  PLT 257 252   BMET  Recent Labs  10/26/13 2357 10/28/13 0415  NA 136 136  K 4.6 2.8*  CL 104 102  CO2 23 26  GLUCOSE 114* 143*  BUN 6 6  CREATININE 0.69 0.49*  CALCIUM 8.1* 8.0*   PT/INR No results found for this basename: LABPROT, INR,  in the last 72 hours ABG No results found for this basename: PHART, PCO2, PO2, HCO3,  in the last 72 hours  Studies/Results: Dg Cholangiogram Operative  10/26/2013   CLINICAL DATA:  Cholecystectomy  EXAM: INTRAOPERATIVE CHOLANGIOGRAM  TECHNIQUE: Cholangiographic images from the C-arm fluoroscopic device were submitted for interpretation post-operatively. Please see the procedural report for the amount of contrast and the fluoroscopy time utilized.  COMPARISON:  None.  FINDINGS: No persistent filling defects in the common duct. Intrahepatic ducts are incompletely visualized, appearing decompressed centrally. Contrast passes into the duodenum.  : Negative for retained common duct stone.   Electronically Signed   By: Oley Balm M.D.   On: 10/26/2013 13:00    Anti-infectives: Anti-infectives   Start     Dose/Rate Route Frequency  Ordered Stop   10/26/13 0600  ceFAZolin (ANCEF) 3 g in dextrose 5 % 50 mL IVPB     3 g 160 mL/hr over 30 Minutes Intravenous On call to O.R. 10/25/13 1415 10/26/13 0830   10/25/13 2200  vancomycin (VANCOCIN) IVPB 1000 mg/200 mL premix  Status:  Discontinued     1,000 mg 200 mL/hr over 60 Minutes Intravenous Every 24 hours 10/24/13 2219 10/27/13 0922   10/25/13 0800  piperacillin-tazobactam (ZOSYN) IVPB 3.375 g     3.375 g 12.5 mL/hr over 240 Minutes Intravenous Every 8 hours 10/24/13 2218     10/24/13 2200  piperacillin-tazobactam (ZOSYN) IVPB 3.375 g     3.375 g 100 mL/hr over 30 Minutes Intravenous  Once 10/24/13 2156 10/24/13 2332   10/24/13 2200  vancomycin (VANCOCIN) IVPB 1000 mg/200 mL premix     1,000 mg 200 mL/hr over 60 Minutes Intravenous  Once 10/24/13 2156 10/25/13 0056      Assessment/Plan: s/p Procedure(s): ATTEMPTED  LAPAROSCOPIC CONVERTED TO OPEN CHOLECYSTECTOMY WITH INTRAOPERATIVE CHOLANGIOGRAM; REPAIR OF CHOLECOLONIC FISTULA; cholecystoduodenal fistula repair; lysis of adhesions; drainage of pelvic abscess; omental patching of duodenal repair (N/A) Transfuse 1 u Replace K KEEP IN UNIT FOR TODAY HOLD LOVANOX DUE TO BLEEDING RISK Cont ABX FOR 8 more days SEVERE PROTEIN CALORIE MALNUTRITION  ON TNA UNTIL GI FUNCTION RETURNS  LOS: 4 days    [name] A. 10/28/2013

## 2013-10-29 ENCOUNTER — Inpatient Hospital Stay (HOSPITAL_COMMUNITY): Payer: BC Managed Care – PPO

## 2013-10-29 DIAGNOSIS — R5381 Other malaise: Secondary | ICD-10-CM

## 2013-10-29 LAB — TYPE AND SCREEN
ABO/RH(D): A POS
Antibody Screen: NEGATIVE
Unit division: 0
Unit division: 0

## 2013-10-29 LAB — PHOSPHORUS: Phosphorus: 2.3 mg/dL (ref 2.3–4.6)

## 2013-10-29 LAB — COMPREHENSIVE METABOLIC PANEL
ALT: 18 U/L (ref 0–35)
AST: 16 U/L (ref 0–37)
Albumin: 1.9 g/dL — ABNORMAL LOW (ref 3.5–5.2)
Alkaline Phosphatase: 128 U/L — ABNORMAL HIGH (ref 39–117)
BUN: 3 mg/dL — ABNORMAL LOW (ref 6–23)
CO2: 27 mEq/L (ref 19–32)
Calcium: 8.1 mg/dL — ABNORMAL LOW (ref 8.4–10.5)
Chloride: 104 mEq/L (ref 96–112)
Creatinine, Ser: 0.46 mg/dL — ABNORMAL LOW (ref 0.50–1.10)
GFR calc Af Amer: >90 mL/min (ref >90)
GFR calc non Af Amer: >90 mL/min (ref >90)
Glucose, Bld: 132 mg/dL — ABNORMAL HIGH (ref 70–99)
Potassium: 3.3 mEq/L — ABNORMAL LOW (ref 3.5–5.1)
Sodium: 139 mEq/L (ref 135–145)
Total Bilirubin: 0.5 mg/dL (ref 0.3–1.2)
Total Protein: 5 g/dL — ABNORMAL LOW (ref 6.0–8.3)

## 2013-10-29 LAB — GLUCOSE, CAPILLARY
Glucose-Capillary: 105 mg/dL — ABNORMAL HIGH (ref 70–99)
Glucose-Capillary: 119 mg/dL — ABNORMAL HIGH (ref 70–99)
Glucose-Capillary: 124 mg/dL — ABNORMAL HIGH (ref 70–99)

## 2013-10-29 LAB — CBC
HCT: 27.8 % — ABNORMAL LOW (ref 36.0–46.0)
Hemoglobin: 9.7 g/dL — ABNORMAL LOW (ref 12.0–15.0)
MCH: 31 pg (ref 26.0–34.0)
MCHC: 34.9 g/dL (ref 30.0–36.0)
MCV: 88.8 fL (ref 78.0–100.0)
Platelets: 258 10*3/uL (ref 150–400)
RBC: 3.13 MIL/uL — ABNORMAL LOW (ref 3.87–5.11)
RDW: 15.9 % — ABNORMAL HIGH (ref 11.5–15.5)
WBC: 14.7 10*3/uL — ABNORMAL HIGH (ref 4.0–10.5)

## 2013-10-29 LAB — MAGNESIUM: Magnesium: 1.6 mg/dL (ref 1.5–2.5)

## 2013-10-29 MED ORDER — IOHEXOL 300 MG/ML  SOLN
150.0000 mL | Freq: Once | INTRAMUSCULAR | Status: AC | PRN
  Administered 2013-10-29: 140 mL via ORAL

## 2013-10-29 MED ORDER — FAT EMULSION 20 % IV EMUL
250.0000 mL | INTRAVENOUS | Status: AC
  Administered 2013-10-29: 250 mL via INTRAVENOUS
  Filled 2013-10-29: qty 250

## 2013-10-29 MED ORDER — TRACE MINERALS CR-CU-F-FE-I-MN-MO-SE-ZN IV SOLN
INTRAVENOUS | Status: AC
  Administered 2013-10-29: 17:00:00 via INTRAVENOUS
  Filled 2013-10-29: qty 1000

## 2013-10-29 MED ORDER — POTASSIUM CHLORIDE IN NACL 20-0.9 MEQ/L-% IV SOLN
INTRAVENOUS | Status: AC
  Administered 2013-10-30: 15:00:00 via INTRAVENOUS
  Filled 2013-10-29 (×3): qty 1000

## 2013-10-29 MED ORDER — MAGNESIUM SULFATE 40 MG/ML IJ SOLN
2.0000 g | Freq: Once | INTRAMUSCULAR | Status: AC
  Administered 2013-10-29: 2 g via INTRAVENOUS
  Filled 2013-10-29: qty 50

## 2013-10-29 MED ORDER — DIPHENHYDRAMINE HCL 12.5 MG/5ML PO ELIX
12.5000 mg | ORAL_SOLUTION | Freq: Every evening | ORAL | Status: DC | PRN
Start: 2013-10-29 — End: 2013-11-08
  Administered 2013-10-29 – 2013-11-06 (×2): 12.5 mg via ORAL
  Filled 2013-10-29: qty 10
  Filled 2013-10-29: qty 5

## 2013-10-29 MED ORDER — POTASSIUM CHLORIDE 10 MEQ/50ML IV SOLN
10.0000 meq | INTRAVENOUS | Status: AC
  Administered 2013-10-29 (×4): 10 meq via INTRAVENOUS
  Filled 2013-10-29 (×4): qty 50

## 2013-10-29 NOTE — Evaluation (Signed)
Physical Therapy Evaluation Patient Details Name: [name] MRN: [id number] DOB: [birth year] Today's Date: 10/29/2013 Time: 8295-6213 PT Time Calculation (min): 25 min  PT Assessment / Plan / Recommendation History of Present Illness  Patient is a 78 y/o female admitted with SIRS s/p percutaneous drain for pelvic abcess and chronic cholecystitis.  She underwent open chole with repair of fistual and drainage of abcess on 10/26/13.  Clinical Impression  Patient presents with decreased mobility due to the deficits listed below and will benefit from skilled PT in the acute setting to maximize independence and allow return home with spouse assist and HHPT.    PT Assessment  Patient needs continued PT services    Follow Up Recommendations  Home health PT    Does the patient have the potential to tolerate intense rehabilitation    N/A  Barriers to Discharge  None      Equipment Recommendations  None recommended by PT (pt to check to see if has walker at home)    Recommendations for Other Services   None  Frequency Min 3X/week    Precautions / Restrictions Precautions Precautions: Fall   Pertinent Vitals/Pain C/o moderate pain in abdomen after bed mobility      Mobility  Bed Mobility Supine to Sit: 4: Min assist;HOB elevated Sit to Supine: 3: Mod assist;HOB elevated Details for Bed Mobility Assistance: Patient reports most painful task is in/out or bed; using PCA Transfers Sit to Stand: 4: Min guard;From bed Stand to Sit: 4: Min guard;To bed Ambulation/Gait Ambulation/Gait Assistance: 4: Min guard Ambulation Distance (Feet): 130 Feet Assistive device: Rolling walker Ambulation/Gait Assistance Details: slow pace, increased BOS and c/o pain with walking Gait Pattern: Step-to pattern Stairs Assistance: 4: Min assist        PT Diagnosis: Difficulty walking;Generalized weakness;Acute pain  PT Problem List: Decreased strength;Decreased activity tolerance;Decreased  mobility;Decreased balance;Decreased knowledge of use of DME;Decreased safety awareness PT Treatment Interventions: DME instruction;Balance training;Gait training;Stair training;Functional mobility training;Patient/family education;Therapeutic activities;Therapeutic exercise     PT Goals(Current goals can be found in the care plan section) Acute Rehab PT Goals Patient Stated Goal: go home PT Goal Formulation: With patient Time For Goal Achievement: 11/12/13 Potential to Achieve Goals: Good  Visit Information  Last PT Received On: 10/29/13 Assistance Needed: +1 History of Present Illness: Patient is a 78 y/o female admitted with SIRS s/p percutaneous drain for pelvic abcess and chronic cholecystitis.  She underwent open chole with repair of fistual and drainage of abcess on 10/26/13.       Prior Functioning  Home Living Family/patient expects to be discharged to:: Private residence Living Arrangements: Spouse/significant other Available Help at Discharge: Family;Available 24 hours/day Type of Home: House Home Access: Stairs to enter [employer] of Steps: 6 Entrance Stairs-Rails: Left Home Layout: Two level;Able to live on main level with bedroom/bathroom Alternate Level Stairs-Number of Steps: 10 Alternate Level Stairs-Rails: Right Home Equipment: Bedside commode Additional Comments: Not sure if has walker Prior Function Level of Independence: Independent Communication Communication: No difficulties    Cognition  Cognition Arousal/Alertness: Awake/alert Behavior During Therapy: WFL for tasks assessed/performed Overall Cognitive Status: Within Functional Limits for tasks assessed    Extremity/Trunk Assessment Lower Extremity Assessment Lower Extremity Assessment: RLE deficits/detail;LLE deficits/detail RLE Deficits / Details: AROM WFL, deferred strength testing on hip due to abdominal pain; knee extension and ankle DF 4+/5 LLE Deficits / Details: AROM WFL,  deferred strength testing on hip due to abdominal pain; knee extension and ankle DF 4+/5  Balance Static Sitting Balance Static Sitting - Balance Support: Bilateral upper extremity supported Static Sitting - Level of Assistance: 5: Stand by assistance  End of Session PT - End of Session Activity Tolerance: Patient limited by pain Patient left: in bed;with call bell/phone within reach  GP     [facility] 10/29/2013, 1:50 PM [place], PT [phone] 10/29/2013

## 2013-10-29 NOTE — Progress Notes (Signed)
PARENTERAL NUTRITION CONSULT NOTE - Follow-up  Pharmacy Consult for TPN Indication: prolonged poor po intake  Allergies  Allergen Reactions  . Cymbalta [Duloxetine Hcl] Other (See Comments)    "kinda went nuts"  . Halcion [Triazolam] Other (See Comments)    "kinda went nuts"    Patient Measurements: Height: 5\' 3"  (160 cm) Weight: 93 lb 4.1 oz (42.3 kg) IBW/kg (Calculated) : 52.4  Vital Signs: Temp: 98.1 F (36.7 C) (11/14 0743) Temp src: Oral (11/14 0743) BP: 153/66 mmHg (11/14 0700) Pulse Rate: 72 (11/14 0700) Intake/Output from previous day: 11/13 0701 - 11/14 0700 In: 2652.1 [I.V.:1260; Blood:340.5; IV Piggyback:237.5; TPN:814.1] Out: 2300 [Urine:1950; Emesis/NG output:300; Drains:50] Intake/Output from this shift: Total I/O In: 139.6 [I.V.:106.6; TPN:33] Out: 465 [Urine:450; Drains:15]  Labs:  Recent Labs  10/28/13 0415 10/28/13 1153 10/29/13 0546  WBC 14.3* 16.6* 14.7*  HGB 6.8* 9.6* 9.7*  HCT 19.9* 28.3* 27.8*  PLT 252 255 258     Recent Labs  10/26/13 2357 10/28/13 0415 10/28/13 1153 10/29/13 0546  NA 136 136 135 139  K 4.6 2.8* 3.8 3.3*  CL 104 102 102 104  CO2 23 26 26 27   GLUCOSE 114* 143* 118* 132*  BUN 6 6 5* 3*  CREATININE 0.69 0.49* 0.46* 0.46*  CALCIUM 8.1* 8.0* 8.4 8.1*  MG  --  1.4*  --  1.6  PHOS  --  2.5  --  2.3  PROT 5.0* 4.9*  --  5.0*  ALBUMIN 2.2* 1.9*  --  1.9*  AST 37 25  --  16  ALT 30 21  --  18  ALKPHOS 145* 117  --  128*  BILITOT 0.7 0.4  --  0.5  PREALBUMIN  --  4.9*  --   --   TRIG  --  200*  --   --    Estimated Creatinine Clearance: 40 ml/min (by C-G formula based on Cr of 0.46).    Recent Labs  10/28/13 0538 10/28/13 1706 10/29/13 0005  GLUCAP 144* 106* 124*    Insulin Requirements in the past 12 hours:  2 unit SSI,   Current Nutrition:  Clinimix at 25 ml/hr and lipids at 8 ml/hr Goal: Clinimix 5/15 at 70 ml/hr and lipids at 8 ml/hr will provide 84 gm protein and 1577 kcal  Nutritional Goals:   1400-1600 kcal; 80-90 gm protein; fluid >/= 1.5 L  Baseline prealbumin 4.9 on 11/13.  Low due to post op state but pt does have protein calorie malnutrition.  Per RD 11/12 " Severe malnutrition in the context of chronic illness and underweight".   Assessment: [name] is a pleasant 71 wf POD # 3 s/p laparoscopic converted to open cholecystectomy with repair of choloduodenal fistula with repair of duodenotomy. Pt started on TPN due to prolonged poor po intake.  GI: POD # 3, faint BS, NGT accidentally pulled out by pt yesterday afternoon. CCS has ordered UGI today to determine if no leak/duodenal closure.  If not leak will start clears.  For now strict NPO.   Endo: No hx DM. CBGs well controlled thus far.  Lytes: K 3.3 from 2.8 - after 4 runs yesterday; pt also with K in MIVF, Mg 1.6 from 1.4 after MD replaced with 2gm IV, Phos 2.3. Appears pt with some amount of refeeding syndrome. Pt at risk for refeeding syndrome due to malnutrition, poor po intake PTA.  Renal: SCr stable. UOP 1.9 ml/kg/hr.  Pt continues on NS with KCl at 90 ml/hr.  Per CCS keep total IVF at 90 ml/hr.   Hepatobil: LFTs wnl. TG 200 at baseline.  ID:  Zosyn D#5/10 for diverticulitis. Afeb. Wbc elevated but trending down.  TPN Access: PICC  TPN day#:3   Plan:  1. Magnesium 2 gm followed by 4 K runs for repletion 2. Increase Clinimix E 5/15 to 40 ml/hr plus lipids at 8 ml/hr.  This will provide 48 gm protein and 1066 kcals.  Goal rate: Clinimix 5/15 at 70 ml/hr and lipids at 8 ml/hr will provide 84 gm protein and 1577 kcal. 3. Adjust NS 20 K to keep total IVF 90 ml/hr as per CCS request 4. F/u BMET, Mg, Phos in a.m. 5. Continue CBGs/SSI at least until pt at goal rate Herby Abraham, Pharm.D. [phone] 10/29/2013 8:35 AM

## 2013-10-29 NOTE — Progress Notes (Signed)
Subjective: Doing fairly well some RUQ tenderness Breathing OK. Ate some jello   Objective: Vital signs in last 24 hours: Temp:  [97.9 F (36.6 C)-98.3 F (36.8 C)] 97.9 F (36.6 C) (11/14 1150) Pulse Rate:  [68-93] 76 (11/14 1200) Resp:  [9-21] 15 (11/14 1200) BP: (101-153)/(56-98) 133/70 mmHg (11/14 1200) SpO2:  [94 %-100 %] 97 % (11/14 1200)  Intake/Output from previous day: 11/13 0701 - 11/14 0700 In: 2652.1 [I.V.:1260; Blood:340.5; IV Piggyback:237.5; TPN:814.1] Out: 2300 [Urine:1950; Emesis/NG output:300; Drains:50] Intake/Output this shift: Total I/O In: 887.2 [I.V.:472.2; IV Piggyback:250; TPN:165] Out: 990 [Urine:975; Drains:15]  Sitting up reading the paper. In no distress. Speech clear and fluent. Mentation seems OK  Lab Results   Recent Labs  10/28/13 1153 10/29/13 0546  WBC 16.6* 14.7*  RBC 3.11* 3.13*  HGB 9.6* 9.7*  HCT 28.3* 27.8*  MCV 91.0 88.8  MCH 30.9 31.0  RDW 16.2* 15.9*  PLT 255 258    Recent Labs  10/28/13 1153 10/29/13 0546  NA 135 139  K 3.8 3.3*  CL 102 104  CO2 26 27  GLUCOSE 118* 132*  BUN 5* 3*  CREATININE 0.46* 0.46*  CALCIUM 8.4 8.1*    Studies/Results: Dg Ugi W/water Sol Cm  10/29/2013   CLINICAL DATA:  Recent cholecystectomy with cholecolonic fistula repair and duodenum repair. Rule out leak.  EXAM: WATER SOLUBLE UPPER GI SERIES  TECHNIQUE: Single-column upper GI series was performed using water soluble contrast.  CONTRAST:  OMNIPAQUE IOHEXOL 300 MG/ML  SOLN  COMPARISON:  CT 10/24/2013  FLUOROSCOPY TIME:  3 min 26 seconds  FINDINGS: Surgical drain in the right abdomen. Mild small bowel dilatation may be due to small-bowel obstruction or ileus.  Decrease esophageal motility. No esophageal stricture. No hiatal hernia or reflux. The stomach filled with contrast. With the patient in the right lateral decubitus position, stomach emptied into the duodenum.  There is edema in the 2nd portion duodenum compatible the recent  surgery. No leak is identified. No obstruction of the duodenum. Contrast is seen entering the proximal jejunum which is not dilated.  IMPRESSION: Small bowel dilatation may represent small bowel obstruction or ileus.  Duodenal edema. No evidence of leak identified. No duodenal obstruction.   Electronically Signed   By: Marlan Palau M.D.   On: 10/29/2013 12:07    Scheduled Meds: . chlorhexidine  15 mL Mouth/Throat BID  . HYDROmorphone PCA 0.3 mg/mL   Intravenous Q4H  . insulin aspart  0-9 Units Subcutaneous Q6H  . pantoprazole (PROTONIX) IV  40 mg Intravenous Q24H  . piperacillin-tazobactam  3.375 g Intravenous Q8H  . potassium chloride  10 mEq Intravenous Q1 Hr x 4  . sodium chloride  10-40 mL Intracatheter Q12H   Continuous Infusions: . Marland KitchenTPN (CLINIMIX-E) Adult 25 mL/hr at 10/28/13 1801   And  . fat emulsion 250 mL (10/28/13 1802)  . Marland KitchenTPN (CLINIMIX-E) Adult     And  . fat emulsion    . 0.9 % NaCl with KCl 20 mEq / L 90 mL/hr at 10/29/13 0917  . 0.9 % NaCl with KCl 20 mEq / L     PRN Meds:diphenhydrAMINE, diphenhydrAMINE, naloxone, ondansetron (ZOFRAN) IV, ondansetron (ZOFRAN) IV, ondansetron, sodium chloride, sodium chloride, zolpidem  Assessment/Plan: S/P CHOLECYSTECTOMY RECENT PELVIC ABSCESSES HYPOTHYROID: will need restart. ? PO, leave to surgery HYPERTENSION: doing OK PROTEIN CALORIE MALNUTRITION: alb down to 1.9 again ? SBO per UGI: no leak noted. Surgery to manage diet advancement ANEMIA: got blood, doing better  Dr Jacky Kindle is on over the weekend. Call if we can help   LOS: 5 days   [name] ALAN 10/29/2013, 12:49 PM

## 2013-10-29 NOTE — Progress Notes (Signed)
Doing ok.  UGI today.  hgb stable.

## 2013-10-29 NOTE — Op Note (Signed)
NAMEMarland Kitchen  [name], [name] NO.:  [id number]  MEDICAL RECORD NO.:  [id number]  LOCATION:  2S07C                        FACILITY:  [facility]  PHYSICIAN:  Maisie Fus A. Vardaan Depascale, M.D.DATE OF BIRTH:  10/23/1935  DATE OF PROCEDURE:  10/26/2013 DATE OF DISCHARGE:                              OPERATIVE REPORT   PREOPERATIVE DIAGNOSIS:  Acute-on-chronic cholecystitis.  POSTOPERATIVE DIAGNOSES: 1. Acute-on-chronic cholecystitis with intra-gallbladder abscess and     gangrene of the gallbladder. 2. Cholecystoduodenal fistula. 3. Cholecystocolonic fistula. 4. Pelvic abscess with small bowel obstruction. 5. Right lobe hepatic cyst.  PROCEDURE PERFORMED: 1. Diagnostic laparoscopy with attempted laparoscopic cholecystectomy. 2. Open cholecystectomy with intraoperative cholangiogram. 3. Repair of cholecystocolonic fistula. 4. Repair of cholecystoduodenal fistula. 5. Omental patching of both colonic and duodenal repairs. 6. Lysis of adhesions. 7. Drainage of pelvic abscess.  SURGEON:  Maisie Fus A. Sianni Cloninger, M.D.  ASSISTANT:  Ardeth Sportsman, MD  ANESTHESIA:  General endotracheal anesthesia.  EBL:  250 mL.  CRYSTALLOID:  2600 mL of crystalloid.  DRAIN:  19 round drain to gallbladder fossa.  BLOOD PRODUCTS GIVEN:  None.  INDICATIONS FOR PROCEDURE:  The patient is a 78 year old female, who was then managed for chronic cholecystitis for a number of weeks.  She became now malnourished, and the hope was to improve her nourishment and then bring her at a later time for elective cholecystectomy.  It has been gone on for at least 2-3 weeks, and she has lost significant weight in this process.  She is unable to eat or drink anything and has intermittently continuous nausea and vomiting.  CT scan showed a thickened gallbladder wall.  She also had an MRI, which showed chronic cholecystitis.  She was readmitted to hospital over the weekend since she was having failure to thrive and was not  going to make it to an outpatient elective cholecystectomy.  I met with the patient's family yesterday and discussed the risk in this circumstance given her poor nutrition with an albumin of 2.2, which is severe.  At this point in time, she continues to be nauseated, did not do well.  It will take a number of weeks to use TNA to get her better, and I felt, it was best to proceed with surgery at this point in time.  I understand there was increased risk of complications of bleeding, infection, poor wound healing, intra-abdominal abscess, the need for other operations, the need for hernia repair down the road, especially if open surgery is done.  I did not feel waiting and TNA was of any benefit to her since it would take a number of more weeks, I did not think, it would improve her condition enough.  I felt getting her gallbladder out would be the quickest way to get her back eating and hopefully improve her quality of life.  I discussed all the significant complications of at least 30% in this circumstance given her poor nutrition and complexity of her problem.  Risks discussed with the patient and the family at the bedside.  Risk of bleeding, infection, poor wound healing, intra- abdominal abscess, the need to do other procedures, bile leak, common bile duct injury, death, DVT, __________ sepsis, poor  wound healing, exacerbation of underlying medical problems.  They understood the complexity of situation, risks associated, and agreed to proceed.  DESCRIPTION OF PROCEDURE:  The patient was met in the holding area. Questions were answered.  She was then taken back to the operating room where she was placed supine on the OR table.  After induction of general anesthesia, the abdomen was prepped and draped in sterile fashion.  A time-out was done, and she was already on Zosyn.  Infraumbilical incision was made.  Dissection was carried through her fascia and fascia was entered in the midline  without difficulty.  Of note, she had significantly dilated small bowel.  Pursestring suture was placed, and a 12 mm Hasson cannula was placed under direct vision.  Pneumoperitoneum was created to 15 mmHg of CO2, and a laparoscope was placed.  She was placed in reversed Trendelenburg.  She had dense omental adhesions, and it looked like her transverse colon was densely adherent to the dome of the gallbladder.  I placed an 11 mm subxiphoid port and two 5 mm ports in the right upper quadrant.  We then began to carefully dissect the omentum off the gallbladder.  The duodenum was also stuck on, this was very dense.  We spent about 45 minutes trying to take all this down.  It was so adherent.  My concern was that she had a cholecystocolonic fistula.  But also she appeared to have a fistula to her duodenum as well.  In light of this, I did not feel I could continue laparoscopic and converted to an open procedure.  The instruments were removed.  The CO2 was allowed to escape.  Right subcostal incision was made, and we were able to enter the abdominal cavity quite easily.  Retractors were placed.  I was able to find the gallbladder.  It was full of stones and pus and was gangrenous.  I went ahead and suctioned out all of this material as best as we could.  We then examined the duodenum, and there was what appeared to be a duodenal fistula to the gallbladder.  We took this down sharply, and then this defect in the duodenum was closed using a single layer of 2-0 Vicryl suture.  The colon was then stuck inferiorly, and we were able to take this down.  This was also a fistulized colon.  Once I was able to take the colon off, I then was able to debride the gallbladder down to the cystic duct, which I could easily see.  I then placed a __________ catheter into this and shot an intraoperative cholangiogram using half-strength Hypaque dye, which showed a very long tortuous cystic duct and the common duct,  which appeared open without any obstruction.  There was free flow of contrast down the common bile duct and right and left hepatic ducts.  I removed the catheter.  I was able to over-sew the cystic duct.  The tissue was quite necrotic, but it seemed to hold with 2-0 Vicryl closing this opening.  The gallbladder because it was left behind was cauterized.  I then examined the colon.  The fistula was identified.  This was then actually opened up from the colon to make sure the mucosal defect was fully identified, which was about a centimeter.  I then closed with a single layer of 2-0 Vicryl.  I then examined the remainder of transverse colon.  I did not see any other injuries.  The duodenum itself was well __________ during this  part of the procedure trying to mobilize the gallbladder, and it was examined 1st, 2nd, 3rd portions and saw no evidence of other injury.  Stomach was normal.  She had significantly dilated small bowel.  I placed my hand in the pelvis, and  small bowel was adherent down to her sigmoid colon just at the level of the bladder. She did have an obstruction.  It looked like it was well, and I felt that I needed to lyse this since this was not something that I was going to resolve __________, extended my right subcostal incision across the midline, was able to __________.  I was able to put my hand and break up the loculations since she had an old pelvic abscess.  It looked like, but there was no pus left in it.  She had a small bowel obstruction from where the small bowel stuck down onto the sigmoid colon, I was able to lyse this.  There was an old abscess cavity in the pelvis, and we were able to look this up.  There was no pus.  There was some oozing from this.  We irrigated this out and saw no evidence of any ongoing infection per se, but it was quite inflamed.  Surgicel was placed in this cavity.  We then irrigated out the abdominal cavity with copious amounts of  saline given the necrotic pus-filled gallbladder and the fact that she had adhesions.  Small bowel was run from the ligament Treitz all way to the ileocecal valve without any evidence of injury to the small bowel.  The transverse descending colon and sigmoid colon were examined, and I saw no evidence of injury to that.  Both right and left ovaries were intact, and these appeared appropriate in size for her age. Bladder was distended.  After irrigation of all these, it was suctioned out.  Hemostasis was achieved.  Through the right upper quadrant stab incision, I placed a drain into her gallbladder fossa space.  We placed an omental patch over the repair to the colon as well as over to the duodenum using interrupted 3-0 Vicryl in the omentum to buttress these repairs.  NG tube was placed in good position.  All sponges were removed, counted, and found to be correct.  We then closed the abdominal wall in 2 layers using #1 PDS.  We left the skin open and was packed. Remaining port sites were stapled closed.  Dry dressings were applied. All final counts of sponge, needle, and instruments were found to be correct at this portion of the case.  The patient was awoke, extubated, and taken to recovery in satisfactory condition.     [name] A. Annalyn Blecher, M.D.     TAC/MEDQ  D:  10/26/2013  T:  10/27/2013  Job:  [id number]

## 2013-10-29 NOTE — Progress Notes (Signed)
Patient ID: [name], female   DOB: 02-01-1935, 78 y.o.   MRN: [id number] 3 Days Post-Op  Subjective: Pt c/o pain at her incision.  NGT came out.  No nausea.  Objective: Vital signs in last 24 hours: Temp:  [97.5 F (36.4 C)-98.3 F (36.8 C)] 98.1 F (36.7 C) (11/14 0743) Pulse Rate:  [68-93] 72 (11/14 0700) Resp:  [9-21] 14 (11/14 0839) BP: (101-153)/(51-98) 153/66 mmHg (11/14 0700) SpO2:  [94 %-100 %] 96 % (11/14 0839) Last BM Date: 10/25/13  Intake/Output from previous day: 11/13 0701 - 11/14 0700 In: 2652.1 [I.V.:1260; Blood:340.5; IV Piggyback:237.5; TPN:814.1] Out: 2300 [Urine:1950; Emesis/NG output:300; Drains:50] Intake/Output this shift: Total I/O In: 139.6 [I.V.:106.6; TPN:33] Out: 465 [Urine:450; Drains:15]  PE: Abd: soft, appropriately tender, wound is clean and dressing changed. +BS, ND, JP drain with just serosang output Heart: regular Lungs: CTAB GU: clear yellow urine  Lab Results:   Recent Labs  10/28/13 1153 10/29/13 0546  WBC 16.6* 14.7*  HGB 9.6* 9.7*  HCT 28.3* 27.8*  PLT 255 258   BMET  Recent Labs  10/28/13 1153 10/29/13 0546  NA 135 139  K 3.8 3.3*  CL 102 104  CO2 26 27  GLUCOSE 118* 132*  BUN 5* 3*  CREATININE 0.46* 0.46*  CALCIUM 8.4 8.1*   PT/INR No results found for this basename: LABPROT, INR,  in the last 72 hours CMP     Component Value Date/Time   NA 139 10/29/2013 0546   K 3.3* 10/29/2013 0546   CL 104 10/29/2013 0546   CO2 27 10/29/2013 0546   GLUCOSE 132* 10/29/2013 0546   BUN 3* 10/29/2013 0546   CREATININE 0.46* 10/29/2013 0546   CALCIUM 8.1* 10/29/2013 0546   PROT 5.0* 10/29/2013 0546   ALBUMIN 1.9* 10/29/2013 0546   AST 16 10/29/2013 0546   ALT 18 10/29/2013 0546   ALKPHOS 128* 10/29/2013 0546   BILITOT 0.5 10/29/2013 0546   GFRNONAA >90 10/29/2013 0546   GFRAA >90 10/29/2013 0546   Lipase  No results found for this basename: lipase       Studies/Results: No results  found.  Anti-infectives: Anti-infectives   Start     Dose/Rate Route Frequency Ordered Stop   10/26/13 0600  ceFAZolin (ANCEF) 3 g in dextrose 5 % 50 mL IVPB     3 g 160 mL/hr over 30 Minutes Intravenous On call to O.R. 10/25/13 1415 10/26/13 0830   10/25/13 2200  vancomycin (VANCOCIN) IVPB 1000 mg/200 mL premix  Status:  Discontinued     1,000 mg 200 mL/hr over 60 Minutes Intravenous Every 24 hours 10/24/13 2219 10/27/13 0922   10/25/13 0800  piperacillin-tazobactam (ZOSYN) IVPB 3.375 g     3.375 g 12.5 mL/hr over 240 Minutes Intravenous Every 8 hours 10/24/13 2218     10/24/13 2200  piperacillin-tazobactam (ZOSYN) IVPB 3.375 g     3.375 g 100 mL/hr over 30 Minutes Intravenous  Once 10/24/13 2156 10/24/13 2332   10/24/13 2200  vancomycin (VANCOCIN) IVPB 1000 mg/200 mL premix     1,000 mg 200 mL/hr over 60 Minutes Intravenous  Once 10/24/13 2156 10/25/13 0056       Assessment/Plan  1. POD 3, s/p lap converted to open cholecystectomy with repair of choloduodenal fistula and repair of duodenotomy. 2. Hypothyroidism 3. PCM/TNA 4. Deconditioning 5. ABL anemia  Plan: 1. Will dc foley today 2. pulm toilet 3. UGI today to evaluate duodenal repair, if ok will give clear liquids 4. PT  consult for mobilization 5. Cont TNA until she can take more orally 6. If she passes her UGI today will restart her synthroid. 7. hgb stable, will follow labs 8. Transfer to SDU today  LOS: 5 days    Damisha Wolff E 10/29/2013, 8:43 AM Pager: [phone]

## 2013-10-30 LAB — BASIC METABOLIC PANEL
BUN: 5 mg/dL — ABNORMAL LOW (ref 6–23)
CO2: 27 mEq/L (ref 19–32)
Calcium: 8.5 mg/dL (ref 8.4–10.5)
Chloride: 99 mEq/L (ref 96–112)
Creatinine, Ser: 0.43 mg/dL — ABNORMAL LOW (ref 0.50–1.10)
GFR calc Af Amer: >90 mL/min (ref >90)
GFR calc non Af Amer: >90 mL/min (ref >90)
Glucose, Bld: 134 mg/dL — ABNORMAL HIGH (ref 70–99)
Potassium: 3.3 mEq/L — ABNORMAL LOW (ref 3.5–5.1)
Sodium: 135 mEq/L (ref 135–145)

## 2013-10-30 LAB — CBC
HCT: 29.8 % — ABNORMAL LOW (ref 36.0–46.0)
Hemoglobin: 10.3 g/dL — ABNORMAL LOW (ref 12.0–15.0)
MCH: 30.9 pg (ref 26.0–34.0)
MCHC: 34.6 g/dL (ref 30.0–36.0)
MCV: 89.5 fL (ref 78.0–100.0)
Platelets: 292 10*3/uL (ref 150–400)
RBC: 3.33 MIL/uL — ABNORMAL LOW (ref 3.87–5.11)
RDW: 15.6 % — ABNORMAL HIGH (ref 11.5–15.5)
WBC: 14.8 10*3/uL — ABNORMAL HIGH (ref 4.0–10.5)

## 2013-10-30 LAB — GLUCOSE, CAPILLARY
Glucose-Capillary: 124 mg/dL — ABNORMAL HIGH (ref 70–99)
Glucose-Capillary: 127 mg/dL — ABNORMAL HIGH (ref 70–99)
Glucose-Capillary: 133 mg/dL — ABNORMAL HIGH (ref 70–99)

## 2013-10-30 LAB — MAGNESIUM: Magnesium: 1.7 mg/dL (ref 1.5–2.5)

## 2013-10-30 LAB — PHOSPHORUS: Phosphorus: 3.2 mg/dL (ref 2.3–4.6)

## 2013-10-30 MED ORDER — LEVOTHYROXINE SODIUM 75 MCG PO TABS
75.0000 ug | ORAL_TABLET | Freq: Every day | ORAL | Status: DC
  Administered 2013-10-30 – 2013-11-08 (×10): 75 ug via ORAL
  Filled 2013-10-30 (×15): qty 1

## 2013-10-30 MED ORDER — SODIUM CHLORIDE 0.9 % IV SOLN
INTRAVENOUS | Status: DC
  Administered 2013-10-31: 20 mL/h via INTRAVENOUS

## 2013-10-30 MED ORDER — POTASSIUM CHLORIDE 10 MEQ/50ML IV SOLN
10.0000 meq | INTRAVENOUS | Status: AC
  Administered 2013-10-30 (×5): 10 meq via INTRAVENOUS
  Filled 2013-10-30 (×5): qty 50

## 2013-10-30 MED ORDER — FAT EMULSION 20 % IV EMUL
250.0000 mL | INTRAVENOUS | Status: AC
  Administered 2013-10-30: 250 mL via INTRAVENOUS
  Filled 2013-10-30: qty 250

## 2013-10-30 MED ORDER — TRACE MINERALS CR-CU-F-FE-I-MN-MO-SE-ZN IV SOLN
INTRAVENOUS | Status: AC
  Administered 2013-10-30: 19:00:00 via INTRAVENOUS
  Filled 2013-10-30: qty 2000

## 2013-10-30 MED ORDER — MAGNESIUM SULFATE 40 MG/ML IJ SOLN
2.0000 g | Freq: Once | INTRAMUSCULAR | Status: AC
  Administered 2013-10-30: 2 g via INTRAVENOUS
  Filled 2013-10-30: qty 50

## 2013-10-30 NOTE — Progress Notes (Signed)
To floor keep on clears and TNA.  CONT ABX AND WATCH wbc.

## 2013-10-30 NOTE — Progress Notes (Signed)
Patient ID: [name], female   DOB: 01/09/1935, 78 y.o.   MRN: [id number] 4 Days Post-Op  Subjective: Pt feels better today, no flatus still, but no nausea.  Tolerating sips of clears. Pain is better today.  Mobilized yesterday  Objective: Vital signs in last 24 hours: Temp:  [97.5 F (36.4 C)-98.4 F (36.9 C)] 98.4 F (36.9 C) (11/15 0738) Pulse Rate:  [74-95] 78 (11/15 0700) Resp:  [10-24] 14 (11/15 0800) BP: (128-176)/(60-96) 138/64 mmHg (11/15 0700) SpO2:  [94 %-98 %] 96 % (11/15 0800) Last BM Date: 10/25/13  Intake/Output from previous day: 11/14 0701 - 11/15 0700 In: 2931.7 [I.V.:1501.2; IV Piggyback:350; TPN:1000.5] Out: 2340 [Urine:2300; Drains:40] Intake/Output this shift: Total I/O In: 152.5 [I.V.:42; IV Piggyback:62.5; TPN:48] Out: 200 [Urine:200]  PE: Abd: soft, appropriately tender, wound is clean and packed, JP with just serous output.  +BS, ND Heart: regular Lungs: CTAB  Lab Results:   Recent Labs  10/29/13 0546 10/30/13 0335  WBC 14.7* 14.8*  HGB 9.7* 10.3*  HCT 27.8* 29.8*  PLT 258 292   BMET  Recent Labs  10/29/13 0546 10/30/13 0335  NA 139 135  K 3.3* 3.3*  CL 104 99  CO2 27 27  GLUCOSE 132* 134*  BUN 3* 5*  CREATININE 0.46* 0.43*  CALCIUM 8.1* 8.5   PT/INR No results found for this basename: LABPROT, INR,  in the last 72 hours CMP     Component Value Date/Time   NA 135 10/30/2013 0335   K 3.3* 10/30/2013 0335   CL 99 10/30/2013 0335   CO2 27 10/30/2013 0335   GLUCOSE 134* 10/30/2013 0335   BUN 5* 10/30/2013 0335   CREATININE 0.43* 10/30/2013 0335   CALCIUM 8.5 10/30/2013 0335   PROT 5.0* 10/29/2013 0546   ALBUMIN 1.9* 10/29/2013 0546   AST 16 10/29/2013 0546   ALT 18 10/29/2013 0546   ALKPHOS 128* 10/29/2013 0546   BILITOT 0.5 10/29/2013 0546   GFRNONAA >90 10/30/2013 0335   GFRAA >90 10/30/2013 0335   Lipase  No results found for this basename: lipase       Studies/Results: Dg Ugi W/water Sol  Cm  10/29/2013   CLINICAL DATA:  Recent cholecystectomy with cholecolonic fistula repair and duodenum repair. Rule out leak.  EXAM: WATER SOLUBLE UPPER GI SERIES  TECHNIQUE: Single-column upper GI series was performed using water soluble contrast.  CONTRAST:  OMNIPAQUE IOHEXOL 300 MG/ML  SOLN  COMPARISON:  CT 10/24/2013  FLUOROSCOPY TIME:  3 min 26 seconds  FINDINGS: Surgical drain in the right abdomen. Mild small bowel dilatation may be due to small-bowel obstruction or ileus.  Decrease esophageal motility. No esophageal stricture. No hiatal hernia or reflux. The stomach filled with contrast. With the patient in the right lateral decubitus position, stomach emptied into the duodenum.  There is edema in the 2nd portion duodenum compatible the recent surgery. No leak is identified. No obstruction of the duodenum. Contrast is seen entering the proximal jejunum which is not dilated.  IMPRESSION: Small bowel dilatation may represent small bowel obstruction or ileus.  Duodenal edema. No evidence of leak identified. No duodenal obstruction.   Electronically Signed   By: Marlan Palau M.D.   On: 10/29/2013 12:07    Anti-infectives: Anti-infectives   Start     Dose/Rate Route Frequency Ordered Stop   10/26/13 0600  ceFAZolin (ANCEF) 3 g in dextrose 5 % 50 mL IVPB     3 g 160 mL/hr over 30 Minutes Intravenous  On call to O.R. 10/25/13 1415 10/26/13 0830   10/25/13 2200  vancomycin (VANCOCIN) IVPB 1000 mg/200 mL premix  Status:  Discontinued     1,000 mg 200 mL/hr over 60 Minutes Intravenous Every 24 hours 10/24/13 2219 10/27/13 0922   10/25/13 0800  piperacillin-tazobactam (ZOSYN) IVPB 3.375 g     3.375 g 12.5 mL/hr over 240 Minutes Intravenous Every 8 hours 10/24/13 2218     10/24/13 2200  piperacillin-tazobactam (ZOSYN) IVPB 3.375 g     3.375 g 100 mL/hr over 30 Minutes Intravenous  Once 10/24/13 2156 10/24/13 2332   10/24/13 2200  vancomycin (VANCOCIN) IVPB 1000 mg/200 mL premix     1,000  mg 200 mL/hr over 60 Minutes Intravenous  Once 10/24/13 2156 10/25/13 0056       Assessment/Plan  1. POD 4, s/p lap converted to open cholecystectomy with repair or choloduodenal fistula and repair of cholocolonic fistula, with abscess drainage and LOA 2. Hypothyroidism 3. PCM/TNA 4. ABL anemia  Plan: 1. Will transfer to the floor today when a bed in available 2. Continues on clears.  She has only had a couple of sips so would like for her to try more and tolerate before advancing her diet 3. Cont dressing changes 4. Cont TNA until she is taking more PO 5. Cont to mobilize and pulm toilet. 6. K being replaced by pharmacy    LOS: 6 days    [name] 10/30/2013, 8:38 AM Pager: [phone]

## 2013-10-30 NOTE — Op Note (Signed)
NAMEMarland Kitchen  [name], [name] NO.:  [id number]  MEDICAL RECORD NO.:  [id number]  LOCATION:  2S07C                        FACILITY:  [facility]  PHYSICIAN:  [name], Dawn MontgomeryDATE OF BIRTH:  02-07-1935  DATE OF PROCEDURE:  10/26/2013 DATE OF DISCHARGE:                              OPERATIVE REPORT   PREOPERATIVE DIAGNOSIS:  Acute on chronic cholecystitis.  POSTOPERATIVE DIAGNOSES: 1. Acute on chronic cholecystitis with gangrenous gallbladder and     abscess. 2. 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 3. Fistula to transverse colon from gallbladder. 4. Small-bowel obstruction from pelvic adhesion secondary to previous     diverticular abscess resulting from diverticulitis.  PROCEDURE: 1. Laparoscopic converted to open cholecystectomy. 2. Repair of cholecystoduodenal fistula. 3. Repair of cholecystocolonic fistula. 4. Drainage of residual pelvic abscess. 5. Omental patching of duodenum and colonic repair. 6. Intraoperative cholangiogram.  SURGEON:  [name], M.D.  ANESTHESIA:  General endotracheal anesthesia.  EBL:  250 mL.  DRAINS:  A 19 round drain to the gallbladder fossa and overlying transverse colon.  IV FLUIDS:  2600 mL of crystalloid.  BLOOD PRODUCTS GIVEN:  None.  SPECIMEN:  Gallbladder to pathology.  INDICATIONS FOR PROCEDURE:  The patient is a 78 year old female with chronic cholecystitis, she has been managed initially by medical means due to her poor nutrition.  She has had multiple admissions to the hospital for abdominal pain, nausea, vomiting, and these have not resolved with conservative action.  Upon evaluation over the weekend, she was readmitted with exacerbation of her symptoms.  MRI was done to exclude malignancy and it showed signs of chronic cholecystitis.  We met on Monday, discussed the options of surgery with her, the pros and cons, especially her increased risk of complications due to her severe protein- calorie  malnutrition.  She was not doing well though on medical management and I did not feel we would have time to get to a point where she would be optimal for surgery.  I discussed this at great length with her family as well as increased risk of complications of bleeding, infection, organ injury, leakage from any sort of anastomotic repair, procedure done as well as bile duct injury, and cardiovascular events. Discussed exacerbation of underlying medical problems as well.  After discussion of the above with her family and her relative risk complications at least being 30%, they still wished to proceed since she was doing so poorly.  DESCRIPTION OF PROCEDURE:  The patient was met in the holding area. Questions were answered.  She was taken back to the operating room where she was placed supine on the operating room table.  After induction of general anesthesia, time-out was done and her abdomen was prepped and draped in sterile fashion.  A 1-cm infraumbilical incision was made after time-out.  Dissection was carried down and we entered her abdominal cavity quite easily and placed a 12-mm trocar under direct vision.  Pneumoperitoneum was created 15 mmHg of CO2 and laparoscope was placed.  She was placed in reverse Trendelenburg.  We placed a second 11- mm port in the subxiphoid position and two 5-mm ports in the right upper quadrant.  The gallbladder was densely scarred in place.  The colon was stuck to it as well as duodenum.  We carefully began to dissect these organs away from the gallbladder.  After about 45 minutes, these were not going to come off the gallbladder and I was concerned for potential fistula to this area.  I tried to decompress the gallbladder, this did not help and I felt that conversion to an open procedure necessary.  Laparoscopic instruments removed and passed off the field.  Right subcostal incision was made.  Dissection was carried through the abdominal wall layers  until we entered the abdominal cavity.  Retractor was placed.  Gallbladder was identified.  It was densely adherent to the colon and has fistulized to the colon and duodenum.  I took these off the gallbladder itself and repaired these with 2-0 and 3-0 Vicryl.  The fistula to the transverse colon was about a 1.5 cm in size and we oversewed this using interrupted 2-0 and 3-0 Vicryl.  The fistula to the duodenum in the same fashion was repaired which was about 5 mm fistula with interrupted 2-0 and 3-0 Vicryl.  The gallbladder was debrided since it was gangrenous, necrotic, and full of pus and stones.  This was all suctioned and irrigated.  I got down to the opening of the cystic duct, was able to cannulate it with a Reddick catheter and perform  a cholangiogram intraoperatively.  The common duct was normal.  There was good drainage of it.  Common hepatic duct and right and left hepatic ducts were normal.  I removed the catheter and over sewed the opening at the base of the gallbladder with 2-0 and 3-0 Vicryl.  I left a little of the gallbladder behind since it was stuck to porta hepatis.  At this point, she had significant small bowel dilation which was noted initially with laparoscopy.  I placed my hand in the pelvis and felt significant adhesion to her distal ileum.  I was able to break up some of the scar tissue and run the small intestine.  I made the incision a little bit larger.  [name] scrubbed in at this point to give me assistance for this part of the case.  We then ran the small intestine and found that she had a small bowel obstruction and decompressed this from a previous pelvic inflammatory process or abscess from her previous diverticulitis.  There is a small abscess cavity that was for the most part scarred in and we opened up and irrigated.  There was some oozing and Surgicel was placed in this.  We then ran the remainder of the small bowel and she was obstructed.   Ascending and transverse colon and descending colon were otherwise without injury except for that stated above.  Sigmoid colon was normal and I did not see any pelvic abscess, was able to get behind her uterus as well and irrigated this.  At this point in time, we replaced all of the viscera and closed the fascia in 2 layers using #1 PDS.  Skin left open.  Of note, drain was placed over the duodenal closure and colonic closure after placing omentum over top of these and securing with 2-0 Vicryl.  This drain was brought out through one of the port sites.  This was placed to bulb suction.  Secured with 2-0 nylon. After packing the wound and closing the port site with staples, all final counts were found to be correct.  The patient was awoke, extubated, and taken to recovery in satisfactory condition.  [name], M.D.     TAC/MEDQ  D:  10/29/2013  T:  10/29/2013  Job:  [id number]

## 2013-10-30 NOTE — Progress Notes (Signed)
Pt to transfer to [address]29 via wheelchair, IVFs infusing, no O2. VS stable at time of transfer. Belongings and family at bedside. Meds in chart.  Aristidis Talerico L

## 2013-10-30 NOTE — Progress Notes (Signed)
Report called to Caren Hazy RN.

## 2013-10-30 NOTE — Progress Notes (Signed)
PARENTERAL NUTRITION CONSULT NOTE - Follow-up  Pharmacy Consult for TPN Indication: prolonged poor po intake  Allergies  Allergen Reactions  . Cymbalta [Duloxetine Hcl] Other (See Comments)    "kinda went nuts"  . Halcion [Triazolam] Other (See Comments)    "kinda went nuts"    Patient Measurements: Height: 5\' 3"  (160 cm) Weight: 93 lb 4.1 oz (42.3 kg) IBW/kg (Calculated) : 52.4  Vital Signs: Temp: 97.8 F (36.6 C) (11/15 0000) Temp src: Oral (11/15 0000) BP: 138/64 mmHg (11/15 0700) Pulse Rate: 78 (11/15 0700) Intake/Output from previous day: 11/14 0701 - 11/15 0700 In: 2931.7 [I.V.:1501.2; IV Piggyback:350; TPN:1000.5] Out: 2340 [Urine:2300; Drains:40] Intake/Output from this shift:    Labs:  Recent Labs  10/28/13 1153 10/29/13 0546 10/30/13 0335  WBC 16.6* 14.7* 14.8*  HGB 9.6* 9.7* 10.3*  HCT 28.3* 27.8* 29.8*  PLT 255 258 292     Recent Labs  10/28/13 0415 10/28/13 1153 10/29/13 0546 10/30/13 0335  NA 136 135 139 135  K 2.8* 3.8 3.3* 3.3*  CL 102 102 104 99  CO2 26 26 27 27   GLUCOSE 143* 118* 132* 134*  BUN 6 5* 3* 5*  CREATININE 0.49* 0.46* 0.46* 0.43*  CALCIUM 8.0* 8.4 8.1* 8.5  MG 1.4*  --  1.6 1.7  PHOS 2.5  --  2.3 3.2  PROT 4.9*  --  5.0*  --   ALBUMIN 1.9*  --  1.9*  --   AST 25  --  16  --   ALT 21  --  18  --   ALKPHOS 117  --  128*  --   BILITOT 0.4  --  0.5  --   PREALBUMIN 4.9*  --   --   --   TRIG 200*  --   --   --    Estimated Creatinine Clearance: 40 ml/min (by C-G formula based on Cr of 0.43).    Recent Labs  10/29/13 1748 10/29/13 2350 10/30/13 0358  GLUCAP 105* 133* 127*    Insulin Requirements in the past 12 hours:  2 unit SSI,   Current Nutrition:  Clinimix at 40 ml/hr and lipids at 8 ml/hr + clear liq diet Goal: Clinimix 5/15 at 70 ml/hr and lipids at 8 ml/hr will provide 84 gm protein and 1577 kcal  Nutritional Goals:  1400-1600 kcal; 80-90 gm protein; fluid >/= 1.5 L  Baseline prealbumin 4.9 on  11/13.  Low due to post op state but pt does have protein calorie malnutrition.  Per RD 11/12 " Severe malnutrition in the context of chronic illness and underweight".   Assessment: [name] is a pleasant 35 wf POD # 4 s/p laparoscopic converted to open cholecystectomy with repair of choloduodenal fistula with repair of duodenotomy. Pt started on TPN due to prolonged poor po intake.  GI: POD # 4, NGT accidentally pulled out by pt 11/13.  UGI 11/14 showed no leak.  Started on clears 11/14. On IV PPI   Endo: No hx DM. CBGs well controlled thus far.  Lytes: K 3.3 - after 4 runs yesterday; pt also with K in MIVF at 40 ml/hr, Mg 1.7 from 1.6  after eplaced with 2gm IV, Phos 3.2  Renal: SCr stable. UOP 2.3 ml/kg/hr.   on NS with KCl at 40 ml/hr.  Per [facility] keep total IVF at 90 ml/hr.   Hepatobil: LFTs wnl. TG 200 at baseline.  ID:  Zosyn D#6/10 for diverticulitis. Afeb. Wbc elevated but trending down/stable.  TPN Access: PICC  TPN day#:4   Plan:  1. Repeat Magnesium 2 gm followed by 5 K runs for repletion 2. Increase Clinimix E 5/15 to goal rate of 70 ml/hr plus lipids at 8 ml/hr.  Clinimix 5/15 at 70 ml/hr and lipids at 8 ml/hr will provide 84 gm protein and 1577 kcal. 3. At 1800 change NS 20 K to plain NS at [facility] 4. F/u BMET, Mg in a.m. 5. DC CBGs/SSI   Herby Abraham, Pharm.D. [phone] 10/30/2013 7:36 AM

## 2013-10-31 ENCOUNTER — Encounter (HOSPITAL_COMMUNITY): Payer: Self-pay | Admitting: Surgery

## 2013-10-31 LAB — BASIC METABOLIC PANEL
BUN: 10 mg/dL (ref 6–23)
CO2: 27 mEq/L (ref 19–32)
Calcium: 8.3 mg/dL — ABNORMAL LOW (ref 8.4–10.5)
Chloride: 96 mEq/L (ref 96–112)
Creatinine, Ser: 0.43 mg/dL — ABNORMAL LOW (ref 0.50–1.10)
GFR calc Af Amer: >90 mL/min (ref >90)
GFR calc non Af Amer: >90 mL/min (ref >90)
Glucose, Bld: 127 mg/dL — ABNORMAL HIGH (ref 70–99)
Potassium: 3.6 mEq/L (ref 3.5–5.1)
Sodium: 132 mEq/L — ABNORMAL LOW (ref 135–145)

## 2013-10-31 LAB — CULTURE, BLOOD (ROUTINE X 2)
Culture: NO GROWTH
Culture: NO GROWTH

## 2013-10-31 LAB — MAGNESIUM: Magnesium: 1.6 mg/dL (ref 1.5–2.5)

## 2013-10-31 MED ORDER — OXYCODONE HCL 5 MG PO TABS
5.0000 mg | ORAL_TABLET | ORAL | Status: DC | PRN
  Administered 2013-11-01 – 2013-11-05 (×15): 10 mg via ORAL
  Filled 2013-10-31 (×15): qty 2

## 2013-10-31 MED ORDER — TRACE MINERALS CR-CU-F-FE-I-MN-MO-SE-ZN IV SOLN
INTRAVENOUS | Status: AC
  Administered 2013-10-31: 18:00:00 via INTRAVENOUS
  Filled 2013-10-31: qty 2000

## 2013-10-31 MED ORDER — MAGNESIUM SULFATE 40 MG/ML IJ SOLN
2.0000 g | Freq: Once | INTRAMUSCULAR | Status: AC
  Administered 2013-10-31: 2 g via INTRAVENOUS
  Filled 2013-10-31 (×2): qty 50

## 2013-10-31 MED ORDER — MORPHINE SULFATE 2 MG/ML IJ SOLN
2.0000 mg | INTRAMUSCULAR | Status: DC | PRN
  Administered 2013-11-01 (×5): 2 mg via INTRAVENOUS
  Filled 2013-10-31 (×5): qty 1

## 2013-10-31 MED ORDER — FAT EMULSION 20 % IV EMUL
250.0000 mL | INTRAVENOUS | Status: AC
  Administered 2013-10-31: 250 mL via INTRAVENOUS
  Filled 2013-10-31: qty 250

## 2013-10-31 MED ORDER — POTASSIUM CHLORIDE 10 MEQ/50ML IV SOLN
10.0000 meq | INTRAVENOUS | Status: AC
  Administered 2013-10-31 (×2): 10 meq via INTRAVENOUS
  Filled 2013-10-31 (×3): qty 50

## 2013-10-31 NOTE — Progress Notes (Signed)
Pt has been up in the chair and to the bedside commode multiple times today, moving well.  Pt having some confusion this late afternoon, husband called to be with pt.  PCA and IV tubings make it difficult for pt to move, perhaps with diet advancing tomorrow, pt can have PCA d/c'd and that will help her mobility.

## 2013-10-31 NOTE — Progress Notes (Signed)
PARENTERAL NUTRITION CONSULT NOTE - Follow-up  Pharmacy Consult for TPN Indication: prolonged poor po intake  Allergies  Allergen Reactions  . Cymbalta [Duloxetine Hcl] Other (See Comments)    "kinda went nuts"  . Halcion [Triazolam] Other (See Comments)    "kinda went nuts"    Patient Measurements: Height: 5\' 3"  (160 cm) Weight: 105 lb 6.4 oz (47.809 kg) IBW/kg (Calculated) : 52.4  Vital Signs: Temp: 98.2 F (36.8 C) (11/16 0508) Temp src: Oral (11/16 0508) BP: 148/65 mmHg (11/16 0508) Pulse Rate: 87 (11/16 0508) Intake/Output from previous day: 11/15 0701 - 11/16 0700 In: 739 [I.V.:188.5; IV Piggyback:262.5; TPN:288] Out: 2640 [Urine:2625; Drains:15] Intake/Output from this shift:    Labs:  Recent Labs  10/28/13 1153 10/29/13 0546 10/30/13 0335  WBC 16.6* 14.7* 14.8*  HGB 9.6* 9.7* 10.3*  HCT 28.3* 27.8* 29.8*  PLT 255 258 292     Recent Labs  10/29/13 0546 10/30/13 0335 10/31/13 0518  NA 139 135 132*  K 3.3* 3.3* 3.6  CL 104 99 96  CO2 27 27 27   GLUCOSE 132* 134* 127*  BUN 3* 5* 10  CREATININE 0.46* 0.43* 0.43*  CALCIUM 8.1* 8.5 8.3*  MG 1.6 1.7 1.6  PHOS 2.3 3.2  --   PROT 5.0*  --   --   ALBUMIN 1.9*  --   --   AST 16  --   --   ALT 18  --   --   ALKPHOS 128*  --   --   BILITOT 0.5  --   --    Estimated Creatinine Clearance: 45.1 ml/min (by C-G formula based on Cr of 0.43).    Recent Labs  10/29/13 2350 10/30/13 0358 10/30/13 0618  GLUCAP 133* 127* 124*    Insulin Requirements in the past 12 hours: CBGs/SSI dc'd 11/15  Current Nutrition:  Clinimix at 70 ml/hr and lipids at 8 ml/hr + clear liq diet Goal: Clinimix 5/15 at 70 ml/hr and lipids at 8 ml/hr will provide 84 gm protein and 1577 kcal  Nutritional Goals:  1400-1600 kcal; 80-90 gm protein; fluid >/= 1.5 L  Baseline prealbumin 4.9 on 11/13.  Low due to post op state but pt does have protein calorie malnutrition.  Per RD 11/12 " Severe malnutrition in the context of  chronic illness and underweight".   Assessment: [name] is a pleasant 38 wf POD # 5 s/p laparoscopic converted to open cholecystectomy with repair of choloduodenal fistula with repair of duodenotomy. Pt started on TPN due to prolonged poor po intake.  GI: POD # 5.  NGT accidentally pulled out by pt 11/13.  UGI 11/14 showed no leak.  Started on clears 11/14. On IV PPI   Endo: No hx DM. CBGs dc'd 11/15.    Lytes: K 3.6 - after 5 runs yesterday; Mg down to 1.6 from 1.7 after 2 gm magnesium IV given daily x 3 days  Renal: SCr stable.    Hepatobil: LFTs wnl. TG 200 at baseline.  ID:  Zosyn D#7/10 for diverticulitis. Afeb. Wbc elevated but trending down/stable.  TPN Access: PICC  TPN day#:5   Plan:  1. Repeat Magnesium 2 gm followed by 2 K runs for repletion 2. continue Clinimix E 5/15 at goal rate of 70 ml/hr plus lipids at 8 ml/hr.  Clinimix 5/15 at 70 ml/hr and lipids at 8 ml/hr will provide 84 gm protein and 1577 kcal. 3. TPN labs in am  Herby Abraham, Pharm.D. [phone] 10/31/2013 7:45 AM

## 2013-10-31 NOTE — Progress Notes (Signed)
5 Days Post-Op   Assessment: s/p Procedure(s): ATTEMPTED  LAPAROSCOPIC CONVERTED TO OPEN CHOLECYSTECTOMY WITH INTRAOPERATIVE CHOLANGIOGRAM; REPAIR OF CHOLECOLONIC FISTULA; cholecystoduodenal fistula repair; lysis of adhesions; drainage of pelvic abscess; omental patching of duodenal repair Patient Active Problem List   Diagnosis Date Noted  . Severe protein-calorie malnutrition 10/09/2013    Class: Chronic  . Tobacco use disorder 10/09/2013    Class: Chronic  . Diarrhea 10/09/2013    Class: Acute  . Pericolonic abscess due to diverticulitis 10/06/2013  . Diverticular disease small and large intestine, perforation, abscess 10/01/2013  . Calculus of gallbladder with acute cholecystitis, without mention of obstruction 10/01/2013  . Loss of weight 10/01/2013  . Goiter 10/01/2013    Appears to be slowly imroving  Plan: No changes today, unless she develops more bowel function. Should be able to advance diet tomorrow. encouraged ambulation  Subjective: No c/o hasn't passed gas, not much pain  Objective: Vital signs in last 24 hours: Temp:  [97.6 F (36.4 C)-98.3 F (36.8 C)] 98.2 F (36.8 C) (11/16 0508) Pulse Rate:  [64-88] 87 (11/16 0508) Resp:  [9-18] 14 (11/16 0736) BP: (106-154)/(48-88) 148/65 mmHg (11/16 0508) SpO2:  [94 %-100 %] 96 % (11/16 0736) Weight:  [105 lb 6.4 oz (47.809 kg)] 105 lb 6.4 oz (47.809 kg) (11/15 1322)   Intake/Output from previous day: 11/15 0701 - 11/16 0700 In: 739 [I.V.:188.5; IV Piggyback:262.5; TPN:288] Out: 2640 [Urine:2625; Drains:15]  General appearance: alert, cooperative and no distress Resp: clear to auscultation bilaterally GI: Soft, slightly distended, mild incisional tenderness, BS+  Incision: healing well, Open but clean so far.  Lab Results:   Recent Labs  10/29/13 0546 10/30/13 0335  WBC 14.7* 14.8*  HGB 9.7* 10.3*  HCT 27.8* 29.8*  PLT 258 292   BMET  Recent Labs  10/30/13 0335 10/31/13 0518  NA 135 132*  K  3.3* 3.6  CL 99 96  CO2 27 27  GLUCOSE 134* 127*  BUN 5* 10  CREATININE 0.43* 0.43*  CALCIUM 8.5 8.3*    MEDS, Scheduled . chlorhexidine  15 mL Mouth/Throat BID  . HYDROmorphone PCA 0.3 mg/mL   Intravenous Q4H  . levothyroxine  75 mcg Oral QAC breakfast  . magnesium sulfate 1 - 4 g bolus IVPB  2 g Intravenous Once  . pantoprazole (PROTONIX) IV  40 mg Intravenous Q24H  . piperacillin-tazobactam  3.375 g Intravenous Q8H  . potassium chloride  10 mEq Intravenous Q1 Hr x 2  . sodium chloride  10-40 mL Intracatheter Q12H    Studies/Results: Dg Ugi W/water Sol Cm  10/29/2013   CLINICAL DATA:  Recent cholecystectomy with cholecolonic fistula repair and duodenum repair. Rule out leak.  EXAM: WATER SOLUBLE UPPER GI SERIES  TECHNIQUE: Single-column upper GI series was performed using water soluble contrast.  CONTRAST:  OMNIPAQUE IOHEXOL 300 MG/ML  SOLN  COMPARISON:  CT 10/24/2013  FLUOROSCOPY TIME:  3 min 26 seconds  FINDINGS: Surgical drain in the right abdomen. Mild small bowel dilatation may be due to small-bowel obstruction or ileus.  Decrease esophageal motility. No esophageal stricture. No hiatal hernia or reflux. The stomach filled with contrast. With the patient in the right lateral decubitus position, stomach emptied into the duodenum.  There is edema in the 2nd portion duodenum compatible the recent surgery. No leak is identified. No obstruction of the duodenum. Contrast is seen entering the proximal jejunum which is not dilated.  IMPRESSION: Small bowel dilatation may represent small bowel obstruction or ileus.  Duodenal  edema. No evidence of leak identified. No duodenal obstruction.   Electronically Signed   By: Marlan Palau M.D.   On: 10/29/2013 12:07      LOS: 7 days     Currie Paris, MD, [facility], [place] [phone]   10/31/2013 8:18 AM

## 2013-11-01 LAB — COMPREHENSIVE METABOLIC PANEL
ALT: 11 U/L (ref 0–35)
AST: 15 U/L (ref 0–37)
Albumin: 2.2 g/dL — ABNORMAL LOW (ref 3.5–5.2)
Alkaline Phosphatase: 145 U/L — ABNORMAL HIGH (ref 39–117)
BUN: 12 mg/dL (ref 6–23)
CO2: 26 mEq/L (ref 19–32)
Calcium: 9.1 mg/dL (ref 8.4–10.5)
Chloride: 96 mEq/L (ref 96–112)
Creatinine, Ser: 0.42 mg/dL — ABNORMAL LOW (ref 0.50–1.10)
GFR calc Af Amer: >90 mL/min (ref >90)
GFR calc non Af Amer: >90 mL/min (ref >90)
Glucose, Bld: 124 mg/dL — ABNORMAL HIGH (ref 70–99)
Potassium: 3.5 mEq/L (ref 3.5–5.1)
Sodium: 134 mEq/L — ABNORMAL LOW (ref 135–145)
Total Bilirubin: 0.5 mg/dL (ref 0.3–1.2)
Total Protein: 6.2 g/dL (ref 6.0–8.3)

## 2013-11-01 LAB — TRIGLYCERIDES: Triglycerides: 132 mg/dL (ref <150)

## 2013-11-01 LAB — CBC
HCT: 27.7 % — ABNORMAL LOW (ref 36.0–46.0)
Hemoglobin: 9.2 g/dL — ABNORMAL LOW (ref 12.0–15.0)
MCH: 30.2 pg (ref 26.0–34.0)
MCHC: 33.2 g/dL (ref 30.0–36.0)
MCV: 90.8 fL (ref 78.0–100.0)
Platelets: 324 10*3/uL (ref 150–400)
RBC: 3.05 MIL/uL — ABNORMAL LOW (ref 3.87–5.11)
RDW: 15.7 % — ABNORMAL HIGH (ref 11.5–15.5)
WBC: 14.8 10*3/uL — ABNORMAL HIGH (ref 4.0–10.5)

## 2013-11-01 LAB — MAGNESIUM: Magnesium: 1.7 mg/dL (ref 1.5–2.5)

## 2013-11-01 LAB — PHOSPHORUS: Phosphorus: 4.1 mg/dL (ref 2.3–4.6)

## 2013-11-01 LAB — PREALBUMIN: Prealbumin: 6.9 mg/dL — ABNORMAL LOW (ref 17.0–34.0)

## 2013-11-01 MED ORDER — ENSURE COMPLETE PO LIQD
237.0000 mL | Freq: Three times a day (TID) | ORAL | Status: DC
  Administered 2013-11-01 – 2013-11-05 (×11): 237 mL via ORAL

## 2013-11-01 MED ORDER — FAT EMULSION 20 % IV EMUL
250.0000 mL | INTRAVENOUS | Status: AC
  Administered 2013-11-01: 250 mL via INTRAVENOUS
  Filled 2013-11-01: qty 250

## 2013-11-01 MED ORDER — MAGNESIUM SULFATE 40 MG/ML IJ SOLN
4.0000 g | Freq: Once | INTRAMUSCULAR | Status: AC
  Administered 2013-11-01: 4 g via INTRAVENOUS
  Filled 2013-11-01: qty 100

## 2013-11-01 MED ORDER — POTASSIUM CHLORIDE 10 MEQ/50ML IV SOLN
10.0000 meq | INTRAVENOUS | Status: AC
  Administered 2013-11-01 (×4): 10 meq via INTRAVENOUS
  Filled 2013-11-01 (×4): qty 50

## 2013-11-01 MED ORDER — TRACE MINERALS CR-CU-F-FE-I-MN-MO-SE-ZN IV SOLN
INTRAVENOUS | Status: AC
  Administered 2013-11-01: 18:00:00 via INTRAVENOUS
  Filled 2013-11-01: qty 2000

## 2013-11-01 MED ORDER — VANCOMYCIN HCL IN DEXTROSE 750-5 MG/150ML-% IV SOLN
750.0000 mg | Freq: Two times a day (BID) | INTRAVENOUS | Status: DC
  Administered 2013-11-01 – 2013-11-05 (×10): 750 mg via INTRAVENOUS
  Filled 2013-11-01 (×12): qty 150

## 2013-11-01 NOTE — Progress Notes (Signed)
ANTIBIOTIC CONSULT NOTE  Pharmacy Consult for vancomycin Indication: R flank cellulitis   Allergies  Allergen Reactions  . Cymbalta [Duloxetine Hcl] Other (See Comments)    "kinda went nuts"  . Halcion [Triazolam] Other (See Comments)    "kinda went nuts"    Patient Measurements: Height: 5\' 3"  (160 cm) Weight: 105 lb 6.4 oz (47.809 kg) IBW/kg (Calculated) : 52.4   Vital Signs: Temp: 98.3 F (36.8 C) (11/17 0500) BP: 133/65 mmHg (11/17 0500) Pulse Rate: 85 (11/17 0500) Intake/Output from previous day: 11/16 0701 - 11/17 0700 In: 3826.3 [P.O.:760; I.V.:362.8; IV Piggyback:200; TPN:2503.5] Out: 3920 [Urine:3900; Drains:20] Intake/Output from this shift:    Labs:  Recent Labs  10/30/13 0335 10/31/13 0518 11/01/13 0447  WBC 14.8*  --   --   HGB 10.3*  --   --   PLT 292  --   --   CREATININE 0.43* 0.43* 0.42*   Estimated Creatinine Clearance: 45.1 ml/min (by C-G formula based on Cr of 0.42). No results found for this basename: VANCOTROUGH, VANCOPEAK, VANCORANDOM, GENTTROUGH, GENTPEAK, GENTRANDOM, TOBRATROUGH, TOBRAPEAK, TOBRARND, AMIKACINPEAK, AMIKACINTROU, AMIKACIN,  in the last 72 hours   Microbiology: Recent Results (from the past 720 hour(s))  CULTURE, ROUTINE-ABSCESS     Status: None   Collection Time    10/02/13 11:43 AM      Result Value Range Status   Specimen Description ABSCESS   Final   Special Requests PELVIC CUL DE SAC   Final   Gram Stain     Final   Value: FEW WBC PRESENT, PREDOMINANTLY PMN     NO SQUAMOUS EPITHELIAL CELLS SEEN     FEW GRAM NEGATIVE RODS     Performed at [facility]   Culture     Final   Value: MODERATE ESCHERICHIA COLI     Performed at [facility]   Report Status 10/04/2013 FINAL   Final   Organism ID, Bacteria ESCHERICHIA COLI   Final  ANAEROBIC CULTURE     Status: None   Collection Time    10/02/13 11:43 AM      Result Value Range Status   Specimen Description ABSCESS   Final   Special Requests PELVIC  CUL DE SAC   Final   Gram Stain     Final   Value: FEW WBC PRESENT, PREDOMINANTLY PMN     NO SQUAMOUS EPITHELIAL CELLS SEEN     FEW GRAM NEGATIVE RODS     Performed at [facility]   Culture     Final   Value: NO ANAEROBES ISOLATED     Performed at [facility]   Report Status 10/07/2013 FINAL   Final  CLOSTRIDIUM DIFFICILE BY PCR     Status: None   Collection Time    10/08/13  1:36 PM      Result Value Range Status   C difficile by pcr NEGATIVE  NEGATIVE Final  CULTURE, BLOOD (ROUTINE X 2)     Status: None   Collection Time    10/24/13 10:00 PM      Result Value Range Status   Specimen Description BLOOD RIGHT ARM   Final   Special Requests BOTTLES DRAWN AEROBIC AND ANAEROBIC 10CC EACH   Final   Culture  Setup Time     Final   Value: 10/25/2013 08:54     Performed at [facility]   Culture     Final   Value: NO GROWTH 5 DAYS  Performed at [facility]   Report Status 10/31/2013 FINAL   Final  CULTURE, BLOOD (ROUTINE X 2)     Status: None   Collection Time    10/24/13 10:10 PM      Result Value Range Status   Specimen Description BLOOD RIGHT HAND   Final   Special Requests BOTTLES DRAWN AEROBIC ONLY 10CC   Final   Culture  Setup Time     Final   Value: 10/25/2013 08:54     Performed at [facility]   Culture     Final   Value: NO GROWTH 5 DAYS     Performed at [facility]   Report Status 10/31/2013 FINAL   Final  URINE CULTURE     Status: None   Collection Time    10/25/13 12:03 AM      Result Value Range Status   Specimen Description URINE, CLEAN CATCH   Final   Special Requests NONE   Final   Culture  Setup Time     Final   Value: 10/25/2013 00:56     Performed at [facility] Count     Final   Value: >=100,000 COLONIES/ML     Performed at [facility]   Culture     Final   Value: Multiple bacterial morphotypes present, none predominant. Suggest appropriate recollection if clinically  indicated.     Performed at [facility]   Report Status 10/26/2013 FINAL   Final  SURGICAL PCR SCREEN     Status: None   Collection Time    10/26/13  4:01 AM      Result Value Range Status   MRSA, PCR NEGATIVE  NEGATIVE Final   Staphylococcus aureus NEGATIVE  NEGATIVE Final   Comment:            The Xpert SA Assay (FDA     approved for NASAL specimens     in patients over 21 years of age),     is one component of     a comprehensive surveillance     program.  Test performance has     been validated by [employer] for patients greater     than or equal to 29 year old.     It is not intended     to diagnose infection nor to     guide or monitor treatment.     Assessment: [name] is known to pharmacy from TPN consult. She is a 78 wf POD # 6 s/p laparoscopic converted to open cholecystectomy with repair of choloduodenal fistula with repair of duodenotomy.  Pharmacy has been consulted to start vancomycin in addition to day # 8 of zosyn to cover cellulitis of R flank.  Her upper incision has significant erythema and induration c/w cellulitis involving the lateral aspect of her wound extending into her right flank.  Her WBC was 14.8 on 11/15.  She has been afebrile.  Creat 0.42 with creat cl  85 ml/min.  WT 47.8 kg.  Goal of Therapy:  Vancomycin trough 10-15 mcg/ml  Plan:  1. Vancomycin 750 mg IV q12h 2. She continues on day # 8 of zosyn 3.375 gm IV q8h, infuse each dose over 4 hrs 3. F/u renal function, wbc, temp, clinical course 4. Check steady-state trough if > 7 days of tx planned Herby Abraham, Pharm.D. [phone] 11/01/2013 9:14 AM

## 2013-11-01 NOTE — Progress Notes (Signed)
Physical Therapy Treatment Patient Details Name: [name] MRN: [id number] DOB: Jan 18, 1935 Today's Date: 11/01/2013 Time: 2841-3244 PT Time Calculation (min): 14 min  PT Assessment / Plan / Recommendation  History of Present Illness Patient is a 78 y/o female admitted with SIRS s/p percutaneous drain for pelvic abcess and chronic cholecystitis.  She underwent open chole with repair of fistual and drainage of abcess on 10/26/13.   PT Comments   Pt refused mobility tasks this morning and this afternoonx2 today due to pain.  Pt agreeable to LE exercises to increase blood flow and for strengthening.  Pt educated on importance of increasing mobility and ambulation for health.  Husband present and supportive/motivating throughout.   Follow Up Recommendations  Home health PT     Does the patient have the potential to tolerate intense rehabilitation     Barriers to Discharge        Equipment Recommendations       Recommendations for Other Services    Frequency Min 3X/week   Progress towards PT Goals Progress towards PT goals: Progressing toward goals  Plan Current plan remains appropriate    Precautions / Restrictions Precautions Precautions: Fall Restrictions Weight Bearing Restrictions: No   Pertinent Vitals/Pain 7/10 stomach pain; pt declined request to talk with nursing    Mobility  Bed Mobility Bed Mobility: Not assessed Details for Bed Mobility Assistance: Pt refused bed mobility and any further mobility tasks due to pain    Exercises General Exercises - Lower Extremity Ankle Circles/Pumps: AROM;Both;10 reps Quad Sets: AROM;Both;10 reps Gluteal Sets: AROM;Both;10 reps Heel Slides: AROM;Both;10 reps Hip ABduction/ADduction: AROM;Both;10 reps Straight Leg Raises: AROM;Both;10 reps   PT Diagnosis:    PT Problem List:   PT Treatment Interventions:     PT Goals (current goals can now be found in the care plan section)    Visit Information  Last PT Received On:  11/01/13 Assistance Needed: +1 History of Present Illness: Patient is a 78 y/o female admitted with SIRS s/p percutaneous drain for pelvic abcess and chronic cholecystitis.  She underwent open chole with repair of fistual and drainage of abcess on 10/26/13.    Subjective Data      Cognition  Cognition Arousal/Alertness: Awake/alert Behavior During Therapy: WFL for tasks assessed/performed Overall Cognitive Status: Within Functional Limits for tasks assessed    Balance     End of Session PT - End of Session Equipment Utilized During Treatment: Gait belt Activity Tolerance: Patient limited by pain Patient left: in bed;with call bell/phone within reach;with family/visitor present Nurse Communication: Mobility status   GP     Ernestina Columbia, SPTA 11/01/2013, 1:52 PM

## 2013-11-01 NOTE — Progress Notes (Signed)
Pt's daughter expressed concerns regarding PCA pain medication. Pt appeared restless,anxious, and confused regarding the current situation and plan of care.  MD contacted.

## 2013-11-01 NOTE — Progress Notes (Signed)
Agree with above, need to monitor cellulitis closely.

## 2013-11-01 NOTE — Progress Notes (Signed)
Seen and agreed 11/01/2013 Marisa Hufstetler Elizabeth PTA [phone] pager [phone] office    

## 2013-11-01 NOTE — Progress Notes (Signed)
Patient ID: [name], female   DOB: [birth year], 78 y.o.   MRN: [id number] 6 Days Post-Op  Subjective: Pt has little appetite.  Had a BM last night.  No nausea.  Tolerating clears, but not eating much.  Urinating a lot.  C/o redness on right flank.  Objective: Vital signs in last 24 hours: Temp:  [97.9 F (36.6 C)-98.9 F (37.2 C)] 98.3 F (36.8 C) (11/17 0500) Pulse Rate:  [85-94] 85 (11/17 0500) Resp:  [13-18] 16 (11/17 0500) BP: (133-150)/(65-80) 133/65 mmHg (11/17 0500) SpO2:  [95 %-99 %] 96 % (11/17 0500) Last BM Date: 10/31/13  Intake/Output from previous day: 11/16 0701 - 11/17 0700 In: 3826.3 [P.O.:760; I.V.:362.8; IV Piggyback:200; TPN:2503.5] Out: 3920 [Urine:3900; Drains:20] Intake/Output this shift:    PE: Abd: soft, +BS, mild distention in lower abdomen. Upper incision is clean and packed, but has significant erythema and induration c/w cellulitis involving the lateral aspect of her wound extending into her right flank.  JP is intact with serous output  Lab Results:   Recent Labs  10/30/13 0335  WBC 14.8*  HGB 10.3*  HCT 29.8*  PLT 292   BMET  Recent Labs  10/31/13 0518 11/01/13 0447  NA 132* 134*  K 3.6 3.5  CL 96 96  CO2 27 26  GLUCOSE 127* 124*  BUN 10 12  CREATININE 0.43* 0.42*  CALCIUM 8.3* 9.1   PT/INR No results found for this basename: LABPROT, INR,  in the last 72 hours CMP     Component Value Date/Time   NA 134* 11/01/2013 0447   K 3.5 11/01/2013 0447   CL 96 11/01/2013 0447   CO2 26 11/01/2013 0447   GLUCOSE 124* 11/01/2013 0447   BUN 12 11/01/2013 0447   CREATININE 0.42* 11/01/2013 0447   CALCIUM 9.1 11/01/2013 0447   PROT 6.2 11/01/2013 0447   ALBUMIN 2.2* 11/01/2013 0447   AST 15 11/01/2013 0447   ALT 11 11/01/2013 0447   ALKPHOS 145* 11/01/2013 0447   BILITOT 0.5 11/01/2013 0447   GFRNONAA >90 11/01/2013 0447   GFRAA >90 11/01/2013 0447   Lipase  No results found for this basename: lipase        Studies/Results: No results found.  Anti-infectives: Anti-infectives   Start     Dose/Rate Route Frequency Ordered Stop   10/26/13 0600  ceFAZolin (ANCEF) 3 g in dextrose 5 % 50 mL IVPB     3 g 160 mL/hr over 30 Minutes Intravenous On call to O.R. 10/25/13 1415 10/26/13 0830   10/25/13 2200  vancomycin (VANCOCIN) IVPB 1000 mg/200 mL premix  Status:  Discontinued     1,000 mg 200 mL/hr over 60 Minutes Intravenous Every 24 hours 10/24/13 2219 10/27/13 0922   10/25/13 0800  piperacillin-tazobactam (ZOSYN) IVPB 3.375 g     3.375 g 12.5 mL/hr over 240 Minutes Intravenous Every 8 hours 10/24/13 2218     10/24/13 2200  piperacillin-tazobactam (ZOSYN) IVPB 3.375 g     3.375 g 100 mL/hr over 30 Minutes Intravenous  Once 10/24/13 2156 10/24/13 2332   10/24/13 2200  vancomycin (VANCOCIN) IVPB 1000 mg/200 mL premix     1,000 mg 200 mL/hr over 60 Minutes Intravenous  Once 10/24/13 2156 10/25/13 0056       Assessment/Plan  1.  POD 6, s/p lap converted to open cholecystectomy with repair or choloduodenal fistula and repair of cholocolonic fistula, with abscess drainage and LOA 2. Right flank cellulitis 3. Hypothyroidism 4. PCM/TNA 5. ABL anemia,  resolved  Plan: 1. Will add Vanc to abx regimen given new cellulitis.  Cont zosyn Vanc: 1/? Zosyn 8/?  Unsure of duration given new infection 2. Cont dressing changes 3. Cont with clears per pt request.  Will add TID ensure to increase oral intake 4. Cont TNA for now as she still needs full support 5. Stop IVFs as she is urinating about once an hour which interferes with sleep.  TNA is running at 75cc/hr and she is taking in some oral liquids. 6. Check cbc today and in am.  Follow BMET  LOS: 8 days    [name] 11/01/2013, 9:00 AM Pager: [phone]

## 2013-11-01 NOTE — Progress Notes (Signed)
PARENTERAL NUTRITION CONSULT NOTE - Follow-up  Pharmacy Consult for TPN Indication: prolonged poor po intake  Allergies  Allergen Reactions  . Cymbalta [Duloxetine Hcl] Other (See Comments)    "kinda went nuts"  . Halcion [Triazolam] Other (See Comments)    "kinda went nuts"    Patient Measurements: Height: 5\' 3"  (160 cm) Weight: 105 lb 6.4 oz (47.809 kg) IBW/kg (Calculated) : 52.4  Vital Signs: Temp: 98.3 F (36.8 C) (11/17 0500) Temp src: Oral (11/16 2110) BP: 133/65 mmHg (11/17 0500) Pulse Rate: 85 (11/17 0500) Intake/Output from previous day: 11/16 0701 - 11/17 0700 In: 3826.3 [P.O.:760; I.V.:362.8; IV Piggyback:200; TPN:2503.5] Out: 3920 [Urine:3900; Drains:20] Intake/Output from this shift:    Labs:  Recent Labs  10/30/13 0335  WBC 14.8*  HGB 10.3*  HCT 29.8*  PLT 292     Recent Labs  10/30/13 0335 10/31/13 0518 11/01/13 0447  NA 135 132* 134*  K 3.3* 3.6 3.5  CL 99 96 96  CO2 27 27 26   GLUCOSE 134* 127* 124*  BUN 5* 10 12  CREATININE 0.43* 0.43* 0.42*  CALCIUM 8.5 8.3* 9.1  MG 1.7 1.6 1.7  PHOS 3.2  --  4.1  PROT  --   --  6.2  ALBUMIN  --   --  2.2*  AST  --   --  15  ALT  --   --  11  ALKPHOS  --   --  145*  BILITOT  --   --  0.5  TRIG  --   --  132   Estimated Creatinine Clearance: 45.1 ml/min (by C-G formula based on Cr of 0.42).    Recent Labs  10/29/13 2350 10/30/13 0358 10/30/13 0618  GLUCAP 133* 127* 124*    Insulin Requirements in the past 12 hours: CBGs/SSI dc'd 11/15  Current Nutrition:  Clinimix at 70 ml/hr and lipids at 8 ml/hr + clear liq diet Goal: Clinimix 5/15 at 70 ml/hr and lipids at 8 ml/hr will provide 84 gm protein and 1577 kcal  Nutritional Goals:  1400-1600 kcal; 80-90 gm protein; fluid >/= 1.5 L  Baseline prealbumin 4.9 on 11/13.  Low due to post op state but pt does have protein calorie malnutrition.  Per RD 11/12 " Severe malnutrition in the context of chronic illness and  underweight".   Assessment: [name] is a pleasant 89 wf POD # 6 s/p laparoscopic converted to open cholecystectomy with repair of choloduodenal fistula with repair of duodenotomy. Pt started on TPN due to prolonged poor po intake.   GI: POD # 6.  NGT accidentally pulled out by pt 11/13.  UGI 11/14 showed no leak.  Started on clears 11/14. On IV PPI. Expect diet will be advanced today   Endo: No hx DM. CBGs dc'd 11/15.    Lytes: K 3.5 - after 2 runs yesterday; Mg 1.7 after 2 gm magnesium IV given daily x 4 days  Renal: SCr stable.    Hepatobil: LFTs wnl. TG 200 at baseline.  ID:  Zosyn D#8/10 for diverticulitis. Afeb. Wbc elevated but trending down/stable.  TPN Access: PICC  TPN day#:6   Plan:  1. Magnesium 4 gm followed by 4 K runs for repletion 2. continue Clinimix E 5/15 at goal rate of 70 ml/hr plus lipids at 8 ml/hr.  Clinimix 5/15 at 70 ml/hr and lipids at 8 ml/hr will provide 84 gm protein and 1577 kcal. 3. Bmet and magnesium in am 4. F/u diet orders/tolerance  Herby Abraham,  Pharm.D. [phone] 11/01/2013 7:39 AM

## 2013-11-02 LAB — BASIC METABOLIC PANEL
BUN: 16 mg/dL (ref 6–23)
CO2: 26 mEq/L (ref 19–32)
Calcium: 8.6 mg/dL (ref 8.4–10.5)
Chloride: 97 mEq/L (ref 96–112)
Creatinine, Ser: 0.5 mg/dL (ref 0.50–1.10)
GFR calc Af Amer: >90 mL/min (ref >90)
GFR calc non Af Amer: >90 mL/min (ref >90)
Glucose, Bld: 133 mg/dL — ABNORMAL HIGH (ref 70–99)
Potassium: 3.8 mEq/L (ref 3.5–5.1)
Sodium: 133 mEq/L — ABNORMAL LOW (ref 135–145)

## 2013-11-02 LAB — CBC
HCT: 27 % — ABNORMAL LOW (ref 36.0–46.0)
Hemoglobin: 9.2 g/dL — ABNORMAL LOW (ref 12.0–15.0)
MCH: 30.8 pg (ref 26.0–34.0)
MCHC: 34.1 g/dL (ref 30.0–36.0)
MCV: 90.3 fL (ref 78.0–100.0)
Platelets: 351 10*3/uL (ref 150–400)
RBC: 2.99 MIL/uL — ABNORMAL LOW (ref 3.87–5.11)
RDW: 16.1 % — ABNORMAL HIGH (ref 11.5–15.5)
WBC: 14.6 10*3/uL — ABNORMAL HIGH (ref 4.0–10.5)

## 2013-11-02 LAB — MAGNESIUM: Magnesium: 1.9 mg/dL (ref 1.5–2.5)

## 2013-11-02 MED ORDER — FAT EMULSION 20 % IV EMUL
250.0000 mL | INTRAVENOUS | Status: AC
  Administered 2013-11-02: 250 mL via INTRAVENOUS
  Filled 2013-11-02: qty 250

## 2013-11-02 MED ORDER — MORPHINE SULFATE 2 MG/ML IJ SOLN
1.0000 mg | INTRAMUSCULAR | Status: DC | PRN
  Administered 2013-11-02 – 2013-11-04 (×10): 2 mg via INTRAVENOUS
  Filled 2013-11-02 (×10): qty 1

## 2013-11-02 MED ORDER — TRACE MINERALS CR-CU-F-FE-I-MN-MO-SE-ZN IV SOLN
INTRAVENOUS | Status: AC
  Administered 2013-11-02: 19:00:00 via INTRAVENOUS
  Filled 2013-11-02: qty 2000

## 2013-11-02 NOTE — Progress Notes (Signed)
Patient medicated for pain 5 times during 7 am-7pm shift.  Continues to complain of pain and calls frequently when just received pain medication.  I asked patient if her pain is different or worse and if so we need to notify surgeon.  She states no but she is having pain only in one spot on the right side where the incision is open.  Attempted to reposition patient to see if this would assist with the pain as patient had pain medication 40 minutes ago.

## 2013-11-02 NOTE — Progress Notes (Signed)
Patient ID: [name], female   DOB: 07-Sep-1935, 78 y.o.   MRN: [id number] 7 Days Post-Op  Subjective: Pt had some of her worst pain since surgery yesterday, but feels better today.  Tolerated some of the liquids.  Drank all 3 ensures yesterday.  Objective: Vital signs in last 24 hours: Temp:  [98.3 F (36.8 C)-98.7 F (37.1 C)] 98.3 F (36.8 C) (11/18 0529) Pulse Rate:  [70-94] 92 (11/18 0529) Resp:  [18-20] 18 (11/18 0529) BP: (106-111)/(46-52) 106/49 mmHg (11/18 0529) SpO2:  [96 %-97 %] 97 % (11/18 0529) Last BM Date: 11/01/13  Intake/Output from previous day: 11/17 0701 - 11/18 0700 In: 1140 [I.V.:10; IV Piggyback:500; TPN:630] Out: 950 [Urine:950] Intake/Output this shift:    PE: Abd: soft, less tender, +BS, ND, right flank with much less erythema and induration today than yesterday.  Wound is clean, but inferior edge is protruding over superior edge.  Lab Results:   Recent Labs  11/01/13 1045 11/02/13 0500  WBC 14.8* 14.6*  HGB 9.2* 9.2*  HCT 27.7* 27.0*  PLT 324 351   BMET  Recent Labs  11/01/13 0447 11/02/13 0500  NA 134* 133*  K 3.5 3.8  CL 96 97  CO2 26 26  GLUCOSE 124* 133*  BUN 12 16  CREATININE 0.42* 0.50  CALCIUM 9.1 8.6   PT/INR No results found for this basename: LABPROT, INR,  in the last 72 hours CMP     Component Value Date/Time   NA 133* 11/02/2013 0500   K 3.8 11/02/2013 0500   CL 97 11/02/2013 0500   CO2 26 11/02/2013 0500   GLUCOSE 133* 11/02/2013 0500   BUN 16 11/02/2013 0500   CREATININE 0.50 11/02/2013 0500   CALCIUM 8.6 11/02/2013 0500   PROT 6.2 11/01/2013 0447   ALBUMIN 2.2* 11/01/2013 0447   AST 15 11/01/2013 0447   ALT 11 11/01/2013 0447   ALKPHOS 145* 11/01/2013 0447   BILITOT 0.5 11/01/2013 0447   GFRNONAA >90 11/02/2013 0500   GFRAA >90 11/02/2013 0500   Lipase  No results found for this basename: lipase       Studies/Results: No results found.  Anti-infectives: Anti-infectives   Start      Dose/Rate Route Frequency Ordered Stop   11/01/13 1000  vancomycin (VANCOCIN) IVPB 750 mg/150 ml premix     750 mg 150 mL/hr over 60 Minutes Intravenous Every 12 hours 11/01/13 0910     10/26/13 0600  ceFAZolin (ANCEF) 3 g in dextrose 5 % 50 mL IVPB     3 g 160 mL/hr over 30 Minutes Intravenous On call to O.R. 10/25/13 1415 10/26/13 0830   10/25/13 2200  vancomycin (VANCOCIN) IVPB 1000 mg/200 mL premix  Status:  Discontinued     1,000 mg 200 mL/hr over 60 Minutes Intravenous Every 24 hours 10/24/13 2219 10/27/13 0922   10/25/13 0800  piperacillin-tazobactam (ZOSYN) IVPB 3.375 g     3.375 g 12.5 mL/hr over 240 Minutes Intravenous Every 8 hours 10/24/13 2218     10/24/13 2200  piperacillin-tazobactam (ZOSYN) IVPB 3.375 g     3.375 g 100 mL/hr over 30 Minutes Intravenous  Once 10/24/13 2156 10/24/13 2332   10/24/13 2200  vancomycin (VANCOCIN) IVPB 1000 mg/200 mL premix     1,000 mg 200 mL/hr over 60 Minutes Intravenous  Once 10/24/13 2156 10/25/13 0056       Assessment/Plan   1. POD 7, s/p lap converted to open cholecystectomy with repair or choloduodenal fistula and repair  of cholocolonic fistula, with abscess drainage and LOA  2. Right flank cellulitis  3. Hypothyroidism  4. PCM/TNA  5. ABL anemia, resolved   Plan:  1. Cont vanc for right flank cellulitit. Cont zosyn  Vanc: 2/?  Zosyn 9/? Unsure of duration given new infection  2. Cont dressing changes.  Not much to do for protruding inferior edge at this point 3. Advance to full liquids and cont ensure 4. Cont TNA for now as she still needs full support.  Will likely need a calorie count at some point. 5. WBC remains stable at 14K.   LOS: 9 days    Patrice Matthew E 11/02/2013, 7:55 AM Pager: [phone]

## 2013-11-02 NOTE — Progress Notes (Signed)
Physical Therapy Treatment Patient Details Name: [name] MRN: [id number] DOB: [birth year] Today's Date: 11/02/2013 Time: 4098-1191 PT Time Calculation (min): 25 min  PT Assessment / Plan / Recommendation  History of Present Illness Patient is a 78 y/o female admitted with SIRS s/p percutaneous drain for pelvic abcess and chronic cholecystitis.  She underwent open chole with repair of fistual and drainage of abcess on 10/26/13.   PT Comments   Pt progressing today with ambulation despite pain.  Pt requires max motivation to participate in PT and to continue with tx.  Recommended that pt attempt stair training prior to d/c home.  Pt will benefit from [facility] PT to increase functional independence and strength at home and decrease care burden of family.  Follow Up Recommendations  Home health PT     Does the patient have the potential to tolerate intense rehabilitation     Barriers to Discharge        Equipment Recommendations       Recommendations for Other Services    Frequency Min 3X/week   Progress towards PT Goals Progress towards PT goals: Progressing toward goals  Plan Current plan remains appropriate    Precautions / Restrictions Precautions Precautions: Fall Restrictions Weight Bearing Restrictions: No   Pertinent Vitals/Pain 7/10 stomach pain; nursing aware/administered medication    Mobility  Bed Mobility Bed Mobility: Rolling Left;Left Sidelying to Sit;Sitting - Scoot to Edge of Bed;Sit to Supine Rolling Left: 5: Supervision Left Sidelying to Sit: 5: Supervision Sitting - Scoot to Edge of Bed: 5: Supervision Sit to Supine: 5: Supervision Transfers Transfers: Sit to Stand;Stand to Sit Sit to Stand: From bed;From chair/3-in-1;5: Supervision Stand to Sit: To bed;To chair/3-in-1;5: Supervision Details for Transfer Assistance: Pt somewhat impulsive requiring cues for safety; Sit<>stands x7 Ambulation/Gait Ambulation/Gait Assistance: 4: Min guard Ambulation  Distance (Feet): 250 Feet Assistive device:  (IV Pole) Ambulation/Gait Assistance Details: Min guard due to mild unsteadiness and weakness, no LOB Gait Pattern: Step-through pattern;Decreased stride length Gait velocity: decreased Stairs: No    Exercises General Exercises - Lower Extremity Ankle Circles/Pumps: AROM;Both;10 reps Straight Leg Raises: AROM;Both;10 reps Sit<>stands x7   PT Diagnosis:    PT Problem List:   PT Treatment Interventions:     PT Goals (current goals can now be found in the care plan section)    Visit Information  Last PT Received On: 11/02/13 Assistance Needed: +1 History of Present Illness: Patient is a 78 y/o female admitted with SIRS s/p percutaneous drain for pelvic abcess and chronic cholecystitis.  She underwent open chole with repair of fistual and drainage of abcess on 10/26/13.    Subjective Data      Cognition  Cognition Arousal/Alertness: Awake/alert Behavior During Therapy: WFL for tasks assessed/performed Overall Cognitive Status: Within Functional Limits for tasks assessed    Balance     End of Session PT - End of Session Equipment Utilized During Treatment: Gait belt Activity Tolerance: Patient limited by pain Patient left: in bed;with call bell/phone within reach Nurse Communication: Mobility status;Patient requests pain meds   GP     Ernestina Columbia, SPTA 11/02/2013, 9:53 AM

## 2013-11-02 NOTE — Progress Notes (Signed)
PARENTERAL NUTRITION CONSULT NOTE - Follow-up  Pharmacy Consult for TPN Indication: prolonged poor po intake  Allergies  Allergen Reactions  . Cymbalta [Duloxetine Hcl] Other (See Comments)    "kinda went nuts"  . Halcion [Triazolam] Other (See Comments)    "kinda went nuts"    Patient Measurements: Height: 5\' 3"  (160 cm) Weight: 105 lb 6.4 oz (47.809 kg) IBW/kg (Calculated) : 52.4  Vital Signs: Temp: 98.3 F (36.8 C) (11/18 0529) Temp src: Oral (11/18 0529) BP: 106/49 mmHg (11/18 0529) Pulse Rate: 92 (11/18 0529) Intake/Output from previous day: 11/17 0701 - 11/18 0700 In: 1140 [I.V.:10; IV Piggyback:500; TPN:630] Out: 950 [Urine:950] Intake/Output from this shift:    Labs:  Recent Labs  11/01/13 1045 11/02/13 0500  WBC 14.8* 14.6*  HGB 9.2* 9.2*  HCT 27.7* 27.0*  PLT 324 351     Recent Labs  10/31/13 0518 11/01/13 0447 11/02/13 0500  NA 132* 134* 133*  K 3.6 3.5 3.8  CL 96 96 97  CO2 27 26 26   GLUCOSE 127* 124* 133*  BUN 10 12 16   CREATININE 0.43* 0.42* 0.50  CALCIUM 8.3* 9.1 8.6  MG 1.6 1.7 1.9  PHOS  --  4.1  --   PROT  --  6.2  --   ALBUMIN  --  2.2*  --   AST  --  15  --   ALT  --  11  --   ALKPHOS  --  145*  --   BILITOT  --  0.5  --   PREALBUMIN  --  6.9*  --   TRIG  --  132  --    Estimated Creatinine Clearance: 45.1 ml/min (by C-G formula based on Cr of 0.5).   No results found for this basename: GLUCAP,  in the last 72 hours  Insulin Requirements in the past 12 hours: CBGs/SSI dc'd 11/15  Current Nutrition:  Clinimix at 70 ml/hr and lipids at 8 ml/hr + full liquid diet Goal: Clinimix 5/15 at 70 ml/hr and lipids at 8 ml/hr will provide 84 gm protein and 1577 kcal  Nutritional Goals:  1400-1600 kcal; 80-90 gm protein; fluid >/= 1.5 L  Baseline prealbumin 4.9 on 11/13.  Low due to post op state but pt does have protein calorie malnutrition.  Per RD 11/12 " Severe malnutrition in the context of chronic illness and  underweight".  Assessment: [name] is a pleasant 89 wf POD # 7 s/p laparoscopic converted to open cholecystectomy with repair of choloduodenal fistula with repair of duodenotomy. Pt started on TPN due to prolonged poor po intake.   GI: UGI 11/14 showed no leak. Diet advanced to full liquids. Pt drinking Ensure x 3 yesterday. Noted plans for probable kcal count. Prealbumin 6.9 - still very low but trending up on TPN.  Endo: No hx DM. CBGs dc'd 11/15.    Lytes: Lytes ok.  Renal: SCr stable. UOP 0.8 ml/kg/hr.   Hepatobil: LFTs wnl. TG down to 132 (200 at baseline).  ID:  Zosyn D#9/10 for diverticulitis. Vanc D#2 for R flank cellulitis. Per PA note, much less erythema/induration than yesterday. Afeb. Wbc elevated but stable.  TPN Access: PICC  TPN day#:7   Plan:  1. Continue Clinimix E 5/15 at goal rate of 70 ml/hr plus lipids at 8 ml/hr.  Clinimix 5/15 at 70 ml/hr and lipids at 8 ml/hr will provide 84 gm protein and 1577 kcal. 2. F/u tolerance of full liquid diet - hopefully can begin to wean  TPN tomorrow. 3. F/u TPN labs on Thursday  Christoper Fabian, PharmD, BCPS Clinical pharmacist, pager [phone] 11/02/2013 8:24 AM

## 2013-11-02 NOTE — Progress Notes (Signed)
NUTRITION CONSULT/FOLLOW UP  DOCUMENTATION CODES Per approved criteria  -Severe malnutrition in the context of chronic illness -Underweight   Intervention:    1. Start 48 calorie count per MD order  2. Continue Ensure Complete po TID, each supplement provides 350 kcal and 13 grams of protein.  3. Encouraged intake of other high calorie/high protein beverages.    Nutrition Dx:   Inadequate oral intake now related to decreased appetite and early satiety as evidenced by poor intake, ongoing  Goal:   TPN to meet > 90% of estimated nutrition needs, progressing  Monitor:   TPN prescription, PO diet advancement & intake, weight, labs, I/O's, supplement accepance  Assessment:   Patient with recent history of abdominal abscess and chronic cholecystitis; + nausea, vomiting and progressive weakness PTA; in ED found to be febrile with leukocytosis and tachycardia; IVF and ABX initiated; surgery consulted and patient admitted for further management.  Patient s/p procedures 11/11: LYSIS OF ADHESIONS REPAIR OF CHOLECOLONIC FISTULA DRAINAGE OF PELVIC ABSCESS REPAIR OF CHOLECYSTODUODENAL FISTULA  TPN started 11/12 and continues secondary to poor intake.   Patient to receive TPN with Clinimix E 5/15 @ 70 ml/hr and lipids @ 8 ml/hr. Provides 1577 kcal and 84 grams protein per day.  Meets 100% of estimated needs.   Diet advanced to Full Liquids this am. I had a discussion about how much nutrition she needs and how to maximize her intake using high calorie foods/liquids. Pt seemed frustrated by husband chiming in about how she needs to eat more to go home. Acknowledged pt's lack of appetite and early satiety as barriers to getting adequate nutrition and ways to work around this. Pt is willing to drink ensure complete.  11/18 Breakfast: 410 kcal, 13 grams protein (OJ and ensure complete)  Height: Ht Readings from Last 1 Encounters:  10/25/13 5\' 3"  (1.6 m)    Weight Status:   Wt Readings  from Last 1 Encounters:  10/30/13 105 lb 6.4 oz (47.809 kg)  Admission weight 102 lb (46.7 kg)  Body mass index is 18.68 kg/(m^2).  Re-estimated needs:  Kcal: 1400-1600 Protein: 80-90 gm Fluid: >/= 1.5 L  Skin: abdominal surgical incision   Diet Order: NPO   Intake/Output Summary (Last 24 hours) at 11/02/13 1325 Last data filed at 11/02/13 0530  Gross per 24 hour  Intake    890 ml  Output    200 ml  Net    690 ml    Labs:   Recent Labs Lab 10/29/13 0546 10/30/13 0335 10/31/13 0518 11/01/13 0447 11/02/13 0500  NA 139 135 132* 134* 133*  K 3.3* 3.3* 3.6 3.5 3.8  CL 104 99 96 96 97  CO2 27 27 27 26 26   BUN 3* 5* 10 12 16   CREATININE 0.46* 0.43* 0.43* 0.42* 0.50  CALCIUM 8.1* 8.5 8.3* 9.1 8.6  MG 1.6 1.7 1.6 1.7 1.9  PHOS 2.3 3.2  --  4.1  --   GLUCOSE 132* 134* 127* 124* 133*    Scheduled Meds: . chlorhexidine  15 mL Mouth/Throat BID  . feeding supplement (ENSURE COMPLETE)  237 mL Oral TID BM  . levothyroxine  75 mcg Oral QAC breakfast  . pantoprazole (PROTONIX) IV  40 mg Intravenous Q24H  . piperacillin-tazobactam  3.375 g Intravenous Q8H  . sodium chloride  10-40 mL Intracatheter Q12H  . vancomycin  750 mg Intravenous Q12H    Continuous Infusions: . Marland KitchenTPN (CLINIMIX-E) Adult 70 mL/hr at 11/01/13 1747   And  .  fat emulsion 250 mL (11/01/13 1747)  . Marland KitchenTPN (CLINIMIX-E) Adult     And  . fat emulsion      Kendell Bane RD, LDN, CNSC [phone] Pager [phone] After Hours Pager

## 2013-11-02 NOTE — Progress Notes (Signed)
Seen and agreed 11/02/2013 Laraine Samet Elizabeth PTA [phone] pager [phone] office    

## 2013-11-03 ENCOUNTER — Ambulatory Visit (INDEPENDENT_AMBULATORY_CARE_PROVIDER_SITE_OTHER): Payer: BC Managed Care – PPO | Admitting: General Surgery

## 2013-11-03 ENCOUNTER — Inpatient Hospital Stay (HOSPITAL_COMMUNITY): Payer: BC Managed Care – PPO

## 2013-11-03 MED ORDER — DOCUSATE SODIUM 100 MG PO CAPS
100.0000 mg | ORAL_CAPSULE | Freq: Two times a day (BID) | ORAL | Status: DC | PRN
  Administered 2013-11-03 – 2013-11-04 (×2): 100 mg via ORAL
  Filled 2013-11-03 (×3): qty 1

## 2013-11-03 MED ORDER — ACETAMINOPHEN 650 MG RE SUPP
650.0000 mg | Freq: Four times a day (QID) | RECTAL | Status: DC
  Administered 2013-11-03 – 2013-11-06 (×13): 650 mg via RECTAL
  Filled 2013-11-03 (×21): qty 1

## 2013-11-03 MED ORDER — BISACODYL 10 MG RE SUPP: 10.0000 mg | Freq: Every day | RECTAL | Status: DC | PRN

## 2013-11-03 MED ORDER — FAT EMULSION 20 % IV EMUL
250.0000 mL | INTRAVENOUS | Status: AC
  Administered 2013-11-03: 250 mL via INTRAVENOUS
  Filled 2013-11-03: qty 250

## 2013-11-03 MED ORDER — ENOXAPARIN SODIUM 40 MG/0.4ML ~~LOC~~ SOLN
40.0000 mg | SUBCUTANEOUS | Status: DC
  Administered 2013-11-03 – 2013-11-08 (×5): 40 mg via SUBCUTANEOUS
  Filled 2013-11-03 (×7): qty 0.4

## 2013-11-03 MED ORDER — TRACE MINERALS CR-CU-F-FE-I-MN-MO-SE-ZN IV SOLN
INTRAVENOUS | Status: AC
  Administered 2013-11-03: 17:00:00 via INTRAVENOUS
  Filled 2013-11-03: qty 2000

## 2013-11-03 NOTE — Progress Notes (Signed)
Patient ID: [name], female   DOB: 08/02/1935, 78 y.o.   MRN: [id number] Agree with pa dorts note from today, check films, I think she has ileus, wbc still up but don't think needs further eval right now, cellulitis improving still cont abx

## 2013-11-03 NOTE — Progress Notes (Signed)
LOS: 10 days   Subjective: POD #8 Patient is having pain around lateral aspect of abdominal wound that is moderately controlled with medication. Husband is at bedside. Patient does not have an appetite but has been able to consume one Ensure a day. She is unsure of flatus. Her last bowel movement was 2 days ago. Denies fever, chills, nausea, chest pain and shortness of breath.   Spoke with daughter on phone about ordering an abdominal scan.   Objective: Vital signs in last 24 hours: Temp:  [97.6 F (36.4 C)-98.7 F (37.1 C)] 98.7 F (37.1 C) (11/19 0601) Pulse Rate:  [85-87] 87 (11/19 0601) Resp:  [16-18] 17 (11/19 0601) BP: (107-124)/(43-63) 124/56 mmHg (11/19 0601) SpO2:  [97 %-98 %] 97 % (11/19 0601) Last BM Date: 11/01/13   Laboratory  CBC  Recent Labs  11/01/13 1045 11/02/13 0500  WBC 14.8* 14.6*  HGB 9.2* 9.2*  HCT 27.7* 27.0*  PLT 324 351   BMET  Recent Labs  11/01/13 0447 11/02/13 0500  NA 134* 133*  K 3.5 3.8  CL 96 97  CO2 26 26  GLUCOSE 124* 133*  BUN 12 16  CREATININE 0.42* 0.50  CALCIUM 9.1 8.6     Physical Exam General appearance: alert and no distress Resp: clear to auscultation bilaterally Cardio: regular rate and rhythm GI: +BS, soft, moderately distended. mild tenderness around upper right incision. Wound is clean and dressed with erythema present. JP drain intact with serous output.  Extremities: extremities normal, atraumatic, no cyanosis or edema   Assessment/Plan: POD 8 S/P lap converted to open cholecystectomy with repair of choloduodenal and cholocolonic fistulas with abscess drainage and LOA Right flank cellulitis: resolving,  WBC count still elevated but stable at 14K, afebrile; Continue on vanc/zosyn  Ileus: Patient continues to have right sided abdominal pain and distension with elevated WBC despite resolution of cellulitis. Consider abdominal CT scan. Continue to mobilize.  Hypothyroidism ABL anemia: resolved FEN: continue  on fulls; continue TNA due to low intake and calorie count   Maris Berger, PA-S General Surgery 11/03/2013   Physician Assistant Preceptor Note: 1.  Agree with above PA-student note 2.  Recheck labs tomorrow, ordered KUB today, ?timing of repeat CT scan if she does not open up, maybe tomorrow 3.  She is less distended, urinating well, unsure of flatus, and no BM since 11/01/13 4.  Cont full liquid diet for now until she opens up more, bowel regimen 5.  Repeat labs tomorrow 6.  DVT proph: ambulation, SCD's and restart lovenox now that anemia is stabilized   Elmon Else, [facility] 11/03/2013

## 2013-11-03 NOTE — Progress Notes (Signed)
PARENTERAL NUTRITION CONSULT NOTE - Follow-up  Pharmacy Consult for TPN Indication: prolonged poor po intake  Allergies  Allergen Reactions  . Cymbalta [Duloxetine Hcl] Other (See Comments)    "kinda went nuts"  . Halcion [Triazolam] Other (See Comments)    "kinda went nuts"    Patient Measurements: Height: 5\' 3"  (160 cm) Weight: 105 lb 6.4 oz (47.809 kg) IBW/kg (Calculated) : 52.4  Vital Signs: Temp: 98.7 F (37.1 C) (11/19 0601) Temp src: Oral (11/19 0601) BP: 124/56 mmHg (11/19 0601) Pulse Rate: 87 (11/19 0601) Intake/Output from previous day: 11/18 0701 - 11/19 0700 In: 970 [I.V.:10; IV Piggyback:300; TPN:660] Out: 5 [Drains:5]  Labs:  Recent Labs  11/01/13 1045 11/02/13 0500  WBC 14.8* 14.6*  HGB 9.2* 9.2*  HCT 27.7* 27.0*  PLT 324 351    Recent Labs  11/01/13 0447 11/02/13 0500  NA 134* 133*  K 3.5 3.8  CL 96 97  CO2 26 26  GLUCOSE 124* 133*  BUN 12 16  CREATININE 0.42* 0.50  CALCIUM 9.1 8.6  MG 1.7 1.9  PHOS 4.1  --   PROT 6.2  --   ALBUMIN 2.2*  --   AST 15  --   ALT 11  --   ALKPHOS 145*  --   BILITOT 0.5  --   PREALBUMIN 6.9*  --   TRIG 132  --    Estimated Creatinine Clearance: 45.1 ml/min (by C-G formula based on Cr of 0.5).   Insulin Requirements in the past 12 hours: CBGs/SSI dc'd 11/15  Current Nutrition:  Clinimix at 70 ml/hr and lipids at 8 ml/hr + full liquid diet Goal: Clinimix 5/15 at 70 ml/hr and lipids at 8 ml/hr will provide 84 gm protein and 1577 kcal  Nutritional Goals:  1400-1600 kcal; 80-90 gm protein; fluid >/= 1.5 L  Baseline prealbumin 4.9 on 11/13.  Low due to post op state but pt does have protein calorie malnutrition.  Per RD 11/12 " Severe malnutrition in the context of chronic illness and underweight".  Assessment: [name] is a pleasant 73 wf POD # 8 s/p laparoscopic converted to open cholecystectomy with repair of choloduodenal fistula with repair of duodenotomy. Pt started on TPN due to prolonged  poor po intake.   GI: UGI 11/14 showed no leak. Diet advanced to full liquids. Pt drinking Ensure x 3 yesterday. She has ongoing kcal count with RD following. Prealbumin 6.9 - still very low but trending up on TPN.  Endo: No hx DM. Serum glucose good.    Lytes: Lytes ok.  Renal: SCr stable. UOP 0.8 ml/kg/hr.   Hepatobil: LFTs wnl. TG down to 132 (200 at baseline).  ID:  Zosyn D#10/10 for diverticulitis. Vanc D#3 for R flank cellulitis. Per PA note, much less erythema/induration than yesterday. Afeb. Wbc elevated but stable.  TPN Access: PICC  TPN day#:8   Plan:  1. Continue Clinimix E 5/15 at goal rate of 70 ml/hr plus lipids at 8 ml/hr.  Clinimix 5/15 at 70 ml/hr and lipids at 8 ml/hr will provide 84 gm protein and 1577 kcal. 2. F/u tolerance of full liquid diet - hopefully can begin to wean TPN. 3. F/u TPN labs on Thursday  Nadara Mustard, PharmD., MS Clinical Pharmacist Pager:  [phone] Thank you for allowing pharmacy to be part of this patients care team. 11/03/2013 8:12 AM

## 2013-11-03 NOTE — Progress Notes (Signed)
Calorie Count Note  48 hour calorie count ordered.  Diet: Full Liquids Supplements: Ensure Complete TID  Breakfast: 50 kcal, 0g protein Lunch: none reported Dinner: none reported Supplements: 120 kcal, 7g protein  Total intake: 170  kcal 7 protein  Nutrition Dx: Inadequate oral intake, ongoing  Intervention:  1.  Continue parenteral nutrition as pt's intake has not improved 2.  Continue to address pain management strategies as pain likely contributing to poor appetite and intake.   3.  Continue calorie count and encouragement of intake.  Discussed needs and barriers with pt and family.  Encouraged small frequent meals.  4.  Consider small liberalization in diet to contain oatmeal, bananas, and mashed potatoes.  When asked about solid food, pt immediately states "I can't handle that."  Loyce Dys, MS RD LDN Clinical Inpatient Dietitian Pager: [phone] Weekend/After hours pager: [phone]

## 2013-11-04 LAB — COMPREHENSIVE METABOLIC PANEL
ALT: 10 U/L (ref 0–35)
AST: 16 U/L (ref 0–37)
Albumin: 1.9 g/dL — ABNORMAL LOW (ref 3.5–5.2)
Alkaline Phosphatase: 148 U/L — ABNORMAL HIGH (ref 39–117)
BUN: 16 mg/dL (ref 6–23)
CO2: 28 mEq/L (ref 19–32)
Calcium: 8.8 mg/dL (ref 8.4–10.5)
Chloride: 99 mEq/L (ref 96–112)
Creatinine, Ser: 0.52 mg/dL (ref 0.50–1.10)
GFR calc Af Amer: >90 mL/min (ref >90)
GFR calc non Af Amer: >90 mL/min (ref >90)
Glucose, Bld: 118 mg/dL — ABNORMAL HIGH (ref 70–99)
Potassium: 3.7 mEq/L (ref 3.5–5.1)
Sodium: 137 mEq/L (ref 135–145)
Total Bilirubin: 0.2 mg/dL — ABNORMAL LOW (ref 0.3–1.2)
Total Protein: 5.4 g/dL — ABNORMAL LOW (ref 6.0–8.3)

## 2013-11-04 LAB — CBC
HCT: 25.4 % — ABNORMAL LOW (ref 36.0–46.0)
Hemoglobin: 8.4 g/dL — ABNORMAL LOW (ref 12.0–15.0)
MCH: 30.3 pg (ref 26.0–34.0)
MCHC: 33.1 g/dL (ref 30.0–36.0)
MCV: 91.7 fL (ref 78.0–100.0)
Platelets: 349 10*3/uL (ref 150–400)
RBC: 2.77 MIL/uL — ABNORMAL LOW (ref 3.87–5.11)
RDW: 15.8 % — ABNORMAL HIGH (ref 11.5–15.5)
WBC: 10.8 10*3/uL — ABNORMAL HIGH (ref 4.0–10.5)

## 2013-11-04 LAB — MAGNESIUM: Magnesium: 1.7 mg/dL (ref 1.5–2.5)

## 2013-11-04 LAB — TRIGLYCERIDES: Triglycerides: 142 mg/dL (ref <150)

## 2013-11-04 LAB — PHOSPHORUS: Phosphorus: 4.1 mg/dL (ref 2.3–4.6)

## 2013-11-04 MED ORDER — FAT EMULSION 20 % IV EMUL
250.0000 mL | INTRAVENOUS | Status: AC
  Administered 2013-11-04: 250 mL via INTRAVENOUS
  Filled 2013-11-04: qty 250

## 2013-11-04 MED ORDER — MORPHINE SULFATE 2 MG/ML IJ SOLN: 1.0000 mg | Freq: Four times a day (QID) | INTRAMUSCULAR | Status: DC

## 2013-11-04 MED ORDER — MORPHINE SULFATE 2 MG/ML IJ SOLN
1.0000 mg | Freq: Four times a day (QID) | INTRAMUSCULAR | Status: DC | PRN
  Administered 2013-11-04: 2 mg via INTRAVENOUS
  Administered 2013-11-04 – 2013-11-05 (×3): 4 mg via INTRAVENOUS
  Filled 2013-11-04 (×3): qty 2
  Filled 2013-11-04: qty 1

## 2013-11-04 MED ORDER — METHOCARBAMOL 100 MG/ML IJ SOLN
500.0000 mg | Freq: Three times a day (TID) | INTRAVENOUS | Status: DC | PRN
  Administered 2013-11-04 – 2013-11-06 (×5): 1000 mg via INTRAVENOUS
  Filled 2013-11-04 (×6): qty 10

## 2013-11-04 MED ORDER — TRACE MINERALS CR-CU-F-FE-I-MN-MO-SE-ZN IV SOLN
INTRAVENOUS | Status: AC
  Administered 2013-11-04: 17:00:00 via INTRAVENOUS
  Filled 2013-11-04: qty 2000

## 2013-11-04 NOTE — Progress Notes (Signed)
No need for repeat ct, will cont abx for another couple days for resolving cellulitis, discussed possible snf with family and they will discuss with patient today.  She is still on tna and does not have appetite.  Not taking in nearly enough to come off

## 2013-11-04 NOTE — Progress Notes (Signed)
PARENTERAL NUTRITION CONSULT NOTE - Follow-up  Pharmacy Consult for TPN Indication: prolonged poor po intake  Allergies  Allergen Reactions  . Cymbalta [Duloxetine Hcl] Other (See Comments)    "kinda went nuts"  . Halcion [Triazolam] Other (See Comments)    "kinda went nuts"   Patient Measurements: Height: 5\' 3"  (160 cm) Weight: 105 lb 6.4 oz (47.809 kg) IBW/kg (Calculated) : 52.4  Vital Signs: Temp: 98.2 F (36.8 C) (11/20 0523) Temp src: Oral (11/20 0523) BP: 107/45 mmHg (11/20 0523) Pulse Rate: 81 (11/20 0523) Intake/Output from previous day: 11/19 0701 - 11/20 0700 In: 1473 [P.O.:510; TPN:963] Out: 1837 [Urine:1825; Drains:12]  Labs:  Recent Labs  11/02/13 0500 11/04/13 0506  WBC 14.6* 10.8*  HGB 9.2* 8.4*  HCT 27.0* 25.4*  PLT 351 349    Recent Labs  11/02/13 0500 11/04/13 0506  NA 133* 137  K 3.8 3.7  CL 97 99  CO2 26 28  GLUCOSE 133* 118*  BUN 16 16  CREATININE 0.50 0.52  CALCIUM 8.6 8.8  MG 1.9 1.7  PHOS  --  4.1  PROT  --  5.4*  ALBUMIN  --  1.9*  AST  --  16  ALT  --  10  ALKPHOS  --  148*  BILITOT  --  0.2*  TRIG  --  142   Estimated Creatinine Clearance: 45.1 ml/min (by C-G formula based on Cr of 0.52).   Insulin Requirements in the past 12 hours: CBGs/SSI dc'd 11/15  Current Nutrition:  Clinimix at 70 ml/hr and lipids at 8 ml/hr + full liquid diet Goal: Clinimix 5/15 at 70 ml/hr and lipids at 8 ml/hr will provide 84 gm protein and 1577 kcal.  Calorie count reveals ineffective oral intake - patient states "can't tolerate solids yet".  Nutritional Goals:  1400-1600 kcal; 80-90 gm protein; fluid >/= 1.5 L  Baseline prealbumin 4.9 on 11/13.  Low due to post op state but pt does have protein calorie malnutrition.  Per RD 11/12 " Severe malnutrition in the context of chronic illness and underweight".  Assessment: [name] is a pleasant 78 wf POD # 8 s/p laparoscopic converted to open cholecystectomy with repair of choloduodenal  fistula with repair of duodenotomy. Pt started on TPN due to prolonged poor po intake.   GI: UGI 11/14 showed no leak. Diet advanced to full liquids. Pt drinking Ensure x 3 yesterday. She has ongoing kcal count with RD following. Prealbumin 6.9 - still very low but trending up on TPN.  Endo: No hx DM. Serum glucose good.    LytesJudieth Keens ok  Renal: SCr stable. UOP 0.8 ml/kg/hr.   Hepatobil: LFTs wnl. TG down to 132 (200 at baseline).  ID:  Zosyn/Vanc- diverticulitis,  R flank cellulitis. Per PA note, much less erythema/induration than yesterday. Afeb. Wbc elevated but stable.  TPN Access: PICC  TPN day #:9  Plan:  1. Continue Clinimix E 5/15 at goal rate of 70 ml/hr plus lipids at 8 ml/hr.  Clinimix 5/15 at 70 ml/hr and lipids at 8 ml/hr will provide 84 gm protein and 1577 kcal. 2. F/u tolerance of full liquid diet - hopefully can begin to wean TPN. 3. F/u TPN labs on Thursday  Nadara Mustard, PharmD., MS Clinical Pharmacist Pager:  [phone] Thank you for allowing pharmacy to be part of this patients care team. 11/04/2013 10:54 AM

## 2013-11-04 NOTE — Progress Notes (Signed)
9 Days Post-Op  Subjective: Pt's pain was better yesterday afternoon after ambulating and having a BM and passing flatus.  Overnight her husband said she slept well, but still c/o of pain.  Tylenol is helping some.    Objective: Vital signs in last 24 hours: Temp:  [97.8 F (36.6 C)-98.5 F (36.9 C)] 98.2 F (36.8 C) (11/20 0523) Pulse Rate:  [70-89] 81 (11/20 0523) Resp:  [15-20] 18 (11/20 0523) BP: (106-115)/(45-52) 107/45 mmHg (11/20 0523) SpO2:  [99 %] 99 % (11/20 0523) Last BM Date: 11/17/13 (per pt)  Intake/Output from previous day: 11-18-23 0701 - 11/20 0700 In: 1473 [P.O.:510; TPN:963] Out: 1837 [Urine:1825; Drains:12] Intake/Output this shift:    PE: Gen:  Alert, NAD, pleasant Abd: Soft, ND, moderate tenderness when removing tape to examen the abdominal wound, +BS, no HSM, wound C/D, drain with minimal sero sanguinous drainage (8ml/24hours), right flank has some lacy erythema without induration, drain site without signs of infection   Lab Results:   Recent Labs  11/02/13 0500 11/04/13 0506  WBC 14.6* 10.8*  HGB 9.2* 8.4*  HCT 27.0* 25.4*  PLT 351 349   BMET  Recent Labs  11/02/13 0500 11/04/13 0506  NA 133* 137  K 3.8 3.7  CL 97 99  CO2 26 28  GLUCOSE 133* 118*  BUN 16 16  CREATININE 0.50 0.52  CALCIUM 8.6 8.8   PT/INR No results found for this basename: LABPROT, INR,  in the last 72 hours CMP     Component Value Date/Time   NA 137 11/04/2013 0506   K 3.7 11/04/2013 0506   CL 99 11/04/2013 0506   CO2 28 11/04/2013 0506   GLUCOSE 118* 11/04/2013 0506   BUN 16 11/04/2013 0506   CREATININE 0.52 11/04/2013 0506   CALCIUM 8.8 11/04/2013 0506   PROT 5.4* 11/04/2013 0506   ALBUMIN 1.9* 11/04/2013 0506   AST 16 11/04/2013 0506   ALT 10 11/04/2013 0506   ALKPHOS 148* 11/04/2013 0506   BILITOT 0.2* 11/04/2013 0506   GFRNONAA >90 11/04/2013 0506   GFRAA >90 11/04/2013 0506   Lipase  No results found for this basename: lipase        Studies/Results: Dg Abd 2 Views  11-17-13   CLINICAL DATA:  Abdominal pain.  EXAM: ABDOMEN - 2 VIEW  COMPARISON:  Correlated with a CT scan dated 10/24/2013, and border soluble upper GI evaluation  FINDINGS: The bowel gas pattern is normal. There is no evidence of free air. No radio-opaque calculi or other significant radiographic abnormality is seen. Drainage catheter is appreciated within the right lower quadrant. Surgical clip is appreciated within the right paraspinous region at the level of the L3 disc space  IMPRESSION: Nonobstructive bowel gas pattern.   Electronically Signed   By: Salome Holmes M.D.   On: Nov 17, 2013 14:48    Anti-infectives: Anti-infectives   Start     Dose/Rate Route Frequency Ordered Stop   11/01/13 1000  vancomycin (VANCOCIN) IVPB 750 mg/150 ml premix     750 mg 150 mL/hr over 60 Minutes Intravenous Every 12 hours 11/01/13 0910     10/26/13 0600  ceFAZolin (ANCEF) 3 g in dextrose 5 % 50 mL IVPB     3 g 160 mL/hr over 30 Minutes Intravenous On call to O.R. 10/25/13 1415 10/26/13 0830   10/25/13 2200  vancomycin (VANCOCIN) IVPB 1000 mg/200 mL premix  Status:  Discontinued     1,000 mg 200 mL/hr over 60 Minutes Intravenous Every 24 hours  10/24/13 2219 10/27/13 0922   10/25/13 0800  piperacillin-tazobactam (ZOSYN) IVPB 3.375 g     3.375 g 12.5 mL/hr over 240 Minutes Intravenous Every 8 hours 10/24/13 2218     10/24/13 2200  piperacillin-tazobactam (ZOSYN) IVPB 3.375 g     3.375 g 100 mL/hr over 30 Minutes Intravenous  Once 10/24/13 2156 10/24/13 2332   10/24/13 2200  vancomycin (VANCOCIN) IVPB 1000 mg/200 mL premix     1,000 mg 200 mL/hr over 60 Minutes Intravenous  Once 10/24/13 2156 10/25/13 0056       Assessment/Plan POD #9 s/p lap converted to open cholecystectomy with repair of choloduodenal and cholocolonic fistulas with abscess drainage and LOA Right flank cellulitis - resolving, wbc normalizing Ileus - improving per  KUB Hypothyroidism ABL anemia - Hgb down a bit today after restarting Lovenox, continue to monitor, dilutional since no signs of bleeding  Plan: 1. Had a BM yesterday and lots of flatus 2. KUB showed non-obstructive bowel gas pattern, ?timing of repeat CT scan if she does not open up 3. Starting to open up, cont full liquid diet for now until she opens up more, bowel regimen  4. Repeat labs tomorrow  5. DVT proph: ambulation, SCD's and lovenox now that anemia is stabilized      LOS: 11 days    DORT, Finnean Cerami 11/04/2013, 8:18 AM Pager: [phone]

## 2013-11-04 NOTE — Progress Notes (Signed)
NUTRITION CONSULT/FOLLOW UP  DOCUMENTATION CODES Per approved criteria  -Severe malnutrition in the context of chronic illness -Underweight   Intervention:   1. Parenteral nutrition; continue as needed per PharmD/MD.  Pt's intake has remained the same- no improvement, averaging 3 cans of Ensure Complete daily. Consider decreasing TPN to 50% of estimated needs as pt is meeting ~50% of her estimated protein needs through Ensure consumption.  2. Continue to address pain management strategies as pain likely contributing to poor appetite and intake. Note pt with non-obstructing gas pattern on KUB. 3. Calorie count; complete.  RD to discontinue.   4. Modify diet; Consider small liberalization in diet to contain oatmeal, bananas, and mashed potatoes. When asked about solid food, pt immediately states "I can't handle that." 5.  Enteral nutrition; pt is tolerating a full liquid diet without abdominal pain. Question of ileus, however is having BMs.  Pt is appropriate for enteral nutrition if warranted by MD.     Nutrition Dx:   Inadequate oral intake now related to decreased appetite and early satiety as evidenced by poor intake, ongoing  Goal:   TPN to meet > 90% of estimated nutrition needs, progressing  Monitor:   TPN prescription, PO diet advancement & intake, weight, labs, I/O's, supplement accepance  Assessment:   Patient with recent history of abdominal abscess and chronic cholecystitis; + nausea, vomiting and progressive weakness PTA; in ED found to be febrile with leukocytosis and tachycardia; IVF and ABX initiated; surgery consulted and patient admitted for further management.  Patient s/p procedures 11/11: LYSIS OF ADHESIONS REPAIR OF CHOLECOLONIC FISTULA DRAINAGE OF PELVIC ABSCESS REPAIR OF CHOLECYSTODUODENAL FISTULA  TPN started 11/12 and continues secondary to poor intake.   Patient to receive TPN with Clinimix E 5/15 @ 70 ml/hr and lipids @ 8 ml/hr. Provides 1577 kcal and 84  grams protein per day.  Meets 100% of estimated needs.   Breakfast: 50 kcal, 0g protein Lunch: 350 kcal, 13g protein Dinner: 350 kcal, 13g protein Supplements: 350 kcal, 13g protein  Total intake: 1100 kcal (78% of minimum estimated needs)  39 protein (48% of minimum estimated needs)  Discussed results with PA.  Also discussed nutrition-related care plan.    Height: Ht Readings from Last 1 Encounters:  10/25/13 5\' 3"  (1.6 m)    Weight Status:   Wt Readings from Last 1 Encounters:  10/30/13 105 lb 6.4 oz (47.809 kg)  Admission weight 102 lb (46.7 kg)  Body mass index is 18.68 kg/(m^2).  Re-estimated needs:  Kcal: 1400-1600 Protein: 80-90 gm Fluid: >/= 1.5 L  Skin: abdominal surgical incision   Diet Order: Full liquids   Intake/Output Summary (Last 24 hours) at 11/04/13 1405 Last data filed at 11/04/13 0600  Gross per 24 hour  Intake   1473 ml  Output   1837 ml  Net   -364 ml    Labs:   Recent Labs Lab 10/30/13 0335  11/01/13 0447 11/02/13 0500 11/04/13 0506  NA 135  < > 134* 133* 137  K 3.3*  < > 3.5 3.8 3.7  CL 99  < > 96 97 99  CO2 27  < > 26 26 28   BUN 5*  < > 12 16 16   CREATININE 0.43*  < > 0.42* 0.50 0.52  CALCIUM 8.5  < > 9.1 8.6 8.8  MG 1.7  < > 1.7 1.9 1.7  PHOS 3.2  --  4.1  --  4.1  GLUCOSE 134*  < > 124* 133* 118*  < > =  values in this interval not displayed.  Scheduled Meds: . acetaminophen  650 mg Rectal Q6H  . enoxaparin (LOVENOX) injection  40 mg Subcutaneous Q24H  . feeding supplement (ENSURE COMPLETE)  237 mL Oral TID BM  . levothyroxine  75 mcg Oral QAC breakfast  . pantoprazole (PROTONIX) IV  40 mg Intravenous Q24H  . piperacillin-tazobactam  3.375 g Intravenous Q8H  . sodium chloride  10-40 mL Intracatheter Q12H  . vancomycin  750 mg Intravenous Q12H    Continuous Infusions: . Marland KitchenTPN (CLINIMIX-E) Adult 70 mL/hr at 11/03/13 1713   And  . fat emulsion 250 mL (11/03/13 1714)  . Marland KitchenTPN (CLINIMIX-E) Adult     And  . fat  emulsion      Loyce Dys, MS RD LDN Clinical Inpatient Dietitian Pager: [phone] Weekend/After hours pager: [phone]

## 2013-11-04 NOTE — Progress Notes (Signed)
Physical Therapy Treatment Patient Details Name: [name] MRN: [id number] DOB: 1935-07-02 Today's Date: 11/04/2013 Time: 5621-3086 PT Time Calculation (min): 23 min  PT Assessment / Plan / Recommendation  History of Present Illness Patient is a 78 y/o female admitted with SIRS s/p percutaneous drain for pelvic abcess and chronic cholecystitis.  She underwent open chole with repair of fistual and drainage of abcess on 10/26/13.   PT Comments   Patient needed encouragement and planned session later than initial attempt to allow participation.  Encouraged fluids or meal tray and pt continued to refuse even with spouse encouragement.  Needs to practice stairs for home entry prior to d/c home.  Follow Up Recommendations  Home health PT     Does the patient have the potential to tolerate intense rehabilitation   N/A  Barriers to Discharge  None      Equipment Recommendations  None recommended by PT    Recommendations for Other Services  None  Frequency Min 3X/week   Progress towards PT Goals Progress towards PT goals: Progressing toward goals  Plan Current plan remains appropriate    Precautions / Restrictions Precautions Precautions: Fall   Pertinent Vitals/Pain 10/10 right side of abdomen under ribs    Mobility  Bed Mobility Rolling Left: 6: Modified independent (Device/Increase time);With rail Left Sidelying to Sit: 5: Supervision;With rails;HOB elevated Sit to Supine: 5: Supervision;HOB elevated Details for Bed Mobility Assistance: cues to scoot over in bed and to aim to head of bed Transfers Sit to Stand: 5: Supervision;From bed;From chair/3-in-1 Stand to Sit: 5: Supervision;To bed;To chair/3-in-1 Details for Transfer Assistance: cues for positioning Ambulation/Gait Ambulation/Gait Assistance: 4: Min guard Ambulation Distance (Feet): 250 Feet Assistive device: None;Other (Comment) (occsionally holding IV pole or hand on nurses desk) Ambulation/Gait Assistance  Details: min guard for safety with pt holding right side of abdomen due to c/o severe pain Gait Pattern: Step-through pattern;Decreased stride length      PT Goals (current goals can now be found in the care plan section)    Visit Information  Last PT Received On: 11/04/13 Assistance Needed: +1 History of Present Illness: Patient is a 78 y/o female admitted with SIRS s/p percutaneous drain for pelvic abcess and chronic cholecystitis.  She underwent open chole with repair of fistual and drainage of abcess on 10/26/13.    Subjective Data   This is the worst pain I have had.   Cognition  Cognition Arousal/Alertness: Awake/alert Behavior During Therapy: WFL for tasks assessed/performed Overall Cognitive Status: Within Functional Limits for tasks assessed    Balance  Static Standing Balance Static Standing - Balance Support: No upper extremity supported Static Standing - Level of Assistance: 5: Stand by assistance  End of Session PT - End of Session Activity Tolerance: Patient limited by pain Patient left: with call bell/phone within reach;in bed;with family/visitor present   GP     [facility] 11/04/2013, 1:36 PM Sheran Lawless, PT [phone] 11/04/2013

## 2013-11-05 ENCOUNTER — Inpatient Hospital Stay (HOSPITAL_COMMUNITY): Payer: BC Managed Care – PPO

## 2013-11-05 LAB — CBC
HCT: 25.6 % — ABNORMAL LOW (ref 36.0–46.0)
Hemoglobin: 8.4 g/dL — ABNORMAL LOW (ref 12.0–15.0)
MCH: 30.3 pg (ref 26.0–34.0)
MCHC: 32.8 g/dL (ref 30.0–36.0)
MCV: 92.4 fL (ref 78.0–100.0)
Platelets: 354 10*3/uL (ref 150–400)
RBC: 2.77 MIL/uL — ABNORMAL LOW (ref 3.87–5.11)
RDW: 16 % — ABNORMAL HIGH (ref 11.5–15.5)
WBC: 10.9 10*3/uL — ABNORMAL HIGH (ref 4.0–10.5)

## 2013-11-05 LAB — BASIC METABOLIC PANEL
BUN: 14 mg/dL (ref 6–23)
CO2: 27 mEq/L (ref 19–32)
Calcium: 8.8 mg/dL (ref 8.4–10.5)
Chloride: 99 mEq/L (ref 96–112)
Creatinine, Ser: 0.46 mg/dL — ABNORMAL LOW (ref 0.50–1.10)
GFR calc Af Amer: >90 mL/min (ref >90)
GFR calc non Af Amer: >90 mL/min (ref >90)
Glucose, Bld: 115 mg/dL — ABNORMAL HIGH (ref 70–99)
Potassium: 3.6 mEq/L (ref 3.5–5.1)
Sodium: 135 mEq/L (ref 135–145)

## 2013-11-05 MED ORDER — TRACE MINERALS CR-CU-F-FE-I-MN-MO-SE-ZN IV SOLN
INTRAVENOUS | Status: DC
  Administered 2013-11-05: 17:00:00 via INTRAVENOUS
  Filled 2013-11-05: qty 1000

## 2013-11-05 MED ORDER — FAT EMULSION 20 % IV EMUL
250.0000 mL | INTRAVENOUS | Status: DC
  Administered 2013-11-05: 250 mL via INTRAVENOUS
  Filled 2013-11-05: qty 250

## 2013-11-05 MED ORDER — LORATADINE 10 MG PO TABS
10.0000 mg | ORAL_TABLET | Freq: Every day | ORAL | Status: DC
  Administered 2013-11-05 – 2013-11-08 (×4): 10 mg via ORAL
  Filled 2013-11-05 (×6): qty 1

## 2013-11-05 MED ORDER — BUSPIRONE HCL 10 MG PO TABS
10.0000 mg | ORAL_TABLET | Freq: Two times a day (BID) | ORAL | Status: DC
  Administered 2013-11-05 – 2013-11-08 (×8): 10 mg via ORAL
  Filled 2013-11-05 (×11): qty 1

## 2013-11-05 MED ORDER — OXYCODONE HCL 5 MG PO TABS
5.0000 mg | ORAL_TABLET | Freq: Four times a day (QID) | ORAL | Status: DC | PRN
  Administered 2013-11-05 (×2): 10 mg via ORAL
  Administered 2013-11-06: 5 mg via ORAL
  Administered 2013-11-06 – 2013-11-08 (×8): 10 mg via ORAL
  Filled 2013-11-05 (×11): qty 2

## 2013-11-05 MED ORDER — TRAMADOL HCL 50 MG PO TABS: 50.0000 mg | ORAL_TABLET | Freq: Four times a day (QID) | ORAL | Status: DC | PRN

## 2013-11-05 MED ORDER — TRAMADOL HCL 50 MG PO TABS
50.0000 mg | ORAL_TABLET | Freq: Four times a day (QID) | ORAL | Status: DC
  Administered 2013-11-05 – 2013-11-08 (×13): 50 mg via ORAL
  Filled 2013-11-05 (×13): qty 1

## 2013-11-05 MED ORDER — ENSURE COMPLETE PO LIQD
237.0000 mL | Freq: Three times a day (TID) | ORAL | Status: DC
  Administered 2013-11-05 – 2013-11-08 (×6): 237 mL via ORAL

## 2013-11-05 MED ORDER — EZETIMIBE-SIMVASTATIN 10-80 MG PO TABS
1.0000 | ORAL_TABLET | Freq: Every day | ORAL | Status: DC
  Administered 2013-11-05 – 2013-11-07 (×3): 1 via ORAL
  Filled 2013-11-05 (×4): qty 1

## 2013-11-05 MED ORDER — LOSARTAN POTASSIUM 50 MG PO TABS
50.0000 mg | ORAL_TABLET | Freq: Every day | ORAL | Status: DC
  Administered 2013-11-05 – 2013-11-08 (×4): 50 mg via ORAL
  Filled 2013-11-05 (×6): qty 1

## 2013-11-05 NOTE — Clinical Social Work Psychosocial (Signed)
Clinical Social Work Department BRIEF PSYCHOSOCIAL ASSESSMENT 11/05/2013  Patient:  [name], [name]     Account Number:  [id number]     Admit date:  10/24/2013  Clinical Social Worker:  Sherre Lain  Date/Time:  11/05/2013 11:27 AM  Referred by:  Care Management  Date Referred:  11/05/2013 Referred for  SNF Placement   Other Referral:   none.   Interview type:  Family Other interview type:   CSW spoke to pt's [place] Natalija Mavis [phone]), regarding pt's plan of care.    PSYCHOSOCIAL DATA Living Status:  FAMILY Admitted from facility:   Level of care:   Primary support name:  Perrin Eddleman Primary support relationship to patient:  SPOUSE Degree of support available:   Strong support system to pt.    CURRENT CONCERNS Current Concerns  Post-Acute Placement   Other Concerns:   none.    SOCIAL WORK ASSESSMENT / PLAN CSW received consult for possible SNF placement for pt. CSW was informed to speak with pt's daughter/HCPOA, Elvina Bosch [phone]), regarding discharge planning. CSW spoke to pt's daughter via phone. Pt's daughter stated that she would prefer for pt to be transferred to a SNF once discharged. CSW explained SNF placement process to pt's daughter. CSW went over with pt's daughter how the pt may be discharged over the weekend if  MD deemed pt medically stable. CSW explained to pt's daughter,iIf pt is discharged over the weekend, pt's family would need to make a SNF choice today 11/05/2013 in order for MD and CSW to complete needed paperwork for weekend admission to a SNF. Pt's daughter stated that she would speak to MD about discharging pt on Monday 11/08/2013. Pt's daughter requested CSW read [facility] list to her. Pt's daughter was not agreeable to SNF placement options in [facility], and requested CSW contact [facility] in [place] to inquire about a SNF bed. CSW explained to pt's daughter that [facility] may not be able to  provide SNF, but CSW would contact them to check. Pt's daughter was agreeable to CSW attempting for [facility] and for CSW to fax clinicals out to [facility]. CSW notified MD of conversation between CSW and pt's daughter. CSW to continue to follow and assist with discharge planning needs.   Assessment/plan status:  Psychosocial Support/Ongoing Assessment of Needs Other assessment/ plan:   none.   Information/referral to community resources:   [facility] placement options.    PATIENTS/FAMILYS RESPONSE TO PLAN OF CARE: Pt's daughter was understanding and agreeable to CSW plan of care.     Darlyn Chamber, LCSWA Clinical Social Worker [phone]

## 2013-11-05 NOTE — Progress Notes (Signed)
NUTRITION FOLLOW UP  DOCUMENTATION CODES Per approved criteria  -Severe malnutrition in the context of chronic illness -Underweight   Intervention:   1. Parenteral nutrition; continue as needed per PharmD/MD.  Pt's TPN has been decreased by 50%.  2. Continue to address pain management strategies as pain likely contributing to poor appetite and intake. Note pt with non-obstructing gas pattern on KUB.  Also note additional factors which may be contributing to pain med needs.  3. Calorie count; complete.  RD to discontinue.   4.  Enteral nutrition; pt is tolerating a full liquid diet without abdominal pain. Question of ileus, however is having BMs.  Pt is appropriate for enteral nutrition if warranted by MD.     Nutrition Dx:   Inadequate oral intake now related to decreased appetite and early satiety as evidenced by poor intake, ongoing  Goal:   TPN to meet > 90% of estimated nutrition needs, progressing  Monitor:   TPN prescription, PO diet advancement & intake, weight, labs, I/O's, supplement accepance  Assessment:   Patient with recent history of abdominal abscess and chronic cholecystitis; + nausea, vomiting and progressive weakness PTA; in ED found to be febrile with leukocytosis and tachycardia; IVF and ABX initiated; surgery consulted and patient admitted for further management.  Patient s/p procedures 11/11: LYSIS OF ADHESIONS REPAIR OF CHOLECOLONIC FISTULA DRAINAGE OF PELVIC ABSCESS REPAIR OF CHOLECYSTODUODENAL FISTULA  TPN started 11/12 and continues secondary to poor intake.   Patient to receive TPN with Clinimix E 5/15 @ 35 ml/hr and lipids @ 8 ml/hr. Provides 788 kcal and 42 grams protein per day.  Meets 50% of estimated needs.   Discussed with pt who is asking for pain medication.  Encouraged pt to increase Ensure to 4 times daily and to try solid foods allowed on diet order today.  Pt distracted by reported pain.    Height: Ht Readings from Last 1 Encounters:   10/25/13 5\' 3"  (1.6 m)    Weight Status:   Wt Readings from Last 1 Encounters:  10/30/13 105 lb 6.4 oz (47.809 kg)  Admission weight 102 lb (46.7 kg)  Body mass index is 18.68 kg/(m^2).  Re-estimated needs:  Kcal: 1400-1600 Protein: 80-90 gm Fluid: >/= 1.5 L  Skin: abdominal surgical incision   Diet Order: Dysphagia 3   Intake/Output Summary (Last 24 hours) at 11/05/13 1232 Last data filed at 11/05/13 0743  Gross per 24 hour  Intake 1758.43 ml  Output    504 ml  Net 1254.43 ml    Labs:   Recent Labs Lab 10/30/13 0335  11/01/13 0447 11/02/13 0500 11/04/13 0506 11/05/13 0452  NA 135  < > 134* 133* 137 135  K 3.3*  < > 3.5 3.8 3.7 3.6  CL 99  < > 96 97 99 99  CO2 27  < > 26 26 28 27   BUN 5*  < > 12 16 16 14   CREATININE 0.43*  < > 0.42* 0.50 0.52 0.46*  CALCIUM 8.5  < > 9.1 8.6 8.8 8.8  MG 1.7  < > 1.7 1.9 1.7  --   PHOS 3.2  --  4.1  --  4.1  --   GLUCOSE 134*  < > 124* 133* 118* 115*  < > = values in this interval not displayed.  Scheduled Meds: . acetaminophen  650 mg Rectal Q6H  . busPIRone  10 mg Oral BID  . enoxaparin (LOVENOX) injection  40 mg Subcutaneous Q24H  . ezetimibe-simvastatin  1  tablet Oral QHS  . feeding supplement (ENSURE COMPLETE)  237 mL Oral TID BM  . levothyroxine  75 mcg Oral QAC breakfast  . loratadine  10 mg Oral Daily  . losartan  50 mg Oral Daily  . pantoprazole (PROTONIX) IV  40 mg Intravenous Q24H  . piperacillin-tazobactam  3.375 g Intravenous Q8H  . sodium chloride  10-40 mL Intracatheter Q12H  . traMADol  50 mg Oral Q6H  . vancomycin  750 mg Intravenous Q12H    Continuous Infusions: . Marland KitchenTPN (CLINIMIX-E) Adult 70 mL/hr at 11/04/13 1701   And  . fat emulsion 250 mL (11/04/13 1701)  . Marland KitchenTPN (CLINIMIX-E) Adult     And  . fat emulsion      Loyce Dys, MS RD LDN Clinical Inpatient Dietitian Pager: [phone] Weekend/After hours pager: [phone]

## 2013-11-05 NOTE — Progress Notes (Signed)
Agree with above 

## 2013-11-05 NOTE — Clinical Social Work Note (Addendum)
CSW has contacted Admissions Coordinator, Minette Brine [phone], fax: [phone]), with [facility] regarding possible SNF placement for pt. CSW faxed clinical information to Maralyn Sago. Maralyn Sago currently reviewing clinical information and will contact CSW once decision has been made. CSW to continue to follow in search of other SNF placement options in [facility].   12:38 pm CSW received phone call from Minette Brine regarding clinical decision. Per Maralyn Sago, pt does not meet SNF placement level of care, but could benefit from other levels of care that are provided at [facility]. Maralyn Sago is calling pt's daughter to discuss bed options. Sarah expected to call CSW prior to COB (4:30pm) regarding Maralyn Sago and pt's daughters discussion and to inform CSW of what is needed from [facility] for transfer to [facility] once pt is medically stable. CSW to continue to follow and to leave appropriate hand off to weekend CSW once information is received.  3:49pm CSW received phone call from Laurier Nancy regarding discharge disposition. Dorena informed CSW that she was interested in her mother being place at [facility] and [facility]. CSW informed Ji that CSW had faxed clinicals out to [facility] and is awaiting bed offers, and that weekend CSW would be following up with family with bed offers. CSW received a call from Minette Brine stating that she had attempted to contact Laurier Nancy in order to discuss bed offers at [facility], however, Maralyn Sago has not received a call back from [facility]. Maralyn Sago stated that she will continue to contact [facility]. CSW continuing to follow and leaving appropriate hand off to weekend CSW.  Darlyn Chamber, LCSWA Clinical Social Worker [phone]

## 2013-11-05 NOTE — Progress Notes (Addendum)
PARENTERAL NUTRITION CONSULT NOTE - Follow-up  Pharmacy Consult:  TPN + Vancomycin Indication:  Prolonged poor PO intake + Cellulitis  Allergies  Allergen Reactions  . Cymbalta [Duloxetine Hcl] Other (See Comments)    "kinda went nuts"  . Halcion [Triazolam] Other (See Comments)    "kinda went nuts"   Patient Measurements: Height: 5\' 3"  (160 cm) Weight: 105 lb 6.4 oz (47.809 kg) IBW/kg (Calculated) : 52.4  Vital Signs: Temp: 98.3 F (36.8 C) (11/21 0546) Temp src: Oral (11/21 0546) BP: 112/44 mmHg (11/21 0546) Pulse Rate: 89 (11/21 0546) Intake/Output from previous day: 11/20 0701 - 11/21 0700 In: 1758.4 [P.O.:300; TPN:1458.4] Out: 304 [Urine:300; Drains:3; Stool:1]  Labs:  Recent Labs  11/04/13 0506 11/05/13 0452  WBC 10.8* 10.9*  HGB 8.4* 8.4*  HCT 25.4* 25.6*  PLT 349 354    Recent Labs  11/04/13 0506 11/05/13 0452  NA 137 135  K 3.7 3.6  CL 99 99  CO2 28 27  GLUCOSE 118* 115*  BUN 16 14  CREATININE 0.52 0.46*  CALCIUM 8.8 8.8  MG 1.7  --   PHOS 4.1  --   PROT 5.4*  --   ALBUMIN 1.9*  --   AST 16  --   ALT 10  --   ALKPHOS 148*  --   BILITOT 0.2*  --   TRIG 142  --    Estimated Creatinine Clearance: 45.1 ml/min (by C-G formula based on Cr of 0.46).     Insulin Requirements in the past 12 hours:  None - SSI/CBGs d/c'ed  Assessment: [name] is a pleasant 89 YOF POD #9 of laparoscopic converted to open cholecystectomy with repair of choloduodenal fistula and duodenotomy.  Patient was started on TPN for prolonged NPO status.  Her PO intake is meeting ~50% of her needs and TPN to be weaned today.  Patient continues on vancomycin and Zosyn for cellulitis.  Patient's renal function has been stable.    GI: UGI 11/14 showed no leak. Ongoing kCal count per RD.  Prealbumin low at 6.9 (improved from 4.9 on 11/13), PPI IV Endo: hx hypothyroid on Synthroid.  No hx DM - CBGS controlled and SSI d/c'ed  Lytes: all WNL Renal: SCr 0.46 (stable), CrCL  45 ml/min, low UOP Cards: VSS Hepatobil: LFTs WNL. TG down to 142 (200 at baseline) Neuro: scheduled APAP ID:  Zosyn/Vanc for right flank cellulitis, much less erythema/induration per PA, afebrile, WBC remains at 10.9  11/9 blood cx - negative 11/10 urine cx - negative  Heme/Onc: hgb stabilizing, plts WNL Best Practices: Lovenox TPN Access: PICC TPN day #: 10  Current Nutrition:  Clinimix at 70 ml/hr and lipids at 8 ml/hr (goal rates) Full liquid diet (1 Ensure yesterday)  Nutritional Goals:  1400-1600 kcal; 80-90 gm protein   Plan:  - Decrease Clinimix E 5/15 to 35 ml/hr + IVFE at 5 ml/hr - Daily multivitamin and trace elements - F/U PO intake to D/C TPN in AM - Continue vancomycin 750mg  IV Q12H and Zosyn 3.375gm IV Q8H - F/U with de-escalating abx - Monitor renal fxn, clinical course, vanc trough tomorrow AM - F/U resume home meds once able:  buspirone, Vytorin, losartan, multivitamin, KCL, zaleplon, loratadine    [name], PharmD, BCPS Pager:  [phone] 11/05/2013, 10:03 AM

## 2013-11-05 NOTE — Progress Notes (Signed)
10 Days Post-Op  Subjective: Pt denies any significant pain or nausea.  Had a large BM yesterday and a small one this am.  Passing flatus.  Ambulating OOB with PT.  Daughter POA very concerned that she is depressed.  Daughter concerned about her addictive behavior to alcohol etc, and potentially narcotics.  She is concerned she is playing the sick role to get attention from her husband.  She's some what refuses to get out of bed.  Objective: Vital signs in last 24 hours: Temp:  [97.6 F (36.4 C)-98.3 F (36.8 C)] 98.3 F (36.8 C) 2023/12/01 0546) Pulse Rate:  [87-89] 89 12-01-2023 0546) Resp:  [15-16] 16 12/01/2023 0546) BP: (112-125)/(44) 112/44 mmHg Dec 01, 2023 0546) SpO2:  [95 %-97 %] 97 % December 01, 2023 0546) Last BM Date: 11/04/13  Intake/Output from previous day: 11/20 0701 12-01-23 0700 In: 1758.4 [P.O.:300; TPN:1458.4] Out: 304 [Urine:300; Drains:3; Stool:1] Intake/Output this shift: Total I/O In: -  Out: 200 [Urine:200]  PE: Gen:  Alert, NAD, pleasant Abd: Soft, minimally tender, ND, +BS, no HSM, thin midline horizontal incisional wound C/D/I, drain with minimal serosanguinous drainage   Lab Results:   Recent Labs  11/04/13 0506 Nov 30, 2013 0452  WBC 10.8* 10.9*  HGB 8.4* 8.4*  HCT 25.4* 25.6*  PLT 349 354   BMET  Recent Labs  11/04/13 0506 30-Nov-2013 0452  NA 137 135  K 3.7 3.6  CL 99 99  CO2 28 27  GLUCOSE 118* 115*  BUN 16 14  CREATININE 0.52 0.46*  CALCIUM 8.8 8.8   PT/INR No results found for this basename: LABPROT, INR,  in the last 72 hours CMP     Component Value Date/Time   NA 135 11-30-2013 0452   K 3.6 2013-11-30 0452   CL 99 11/30/2013 0452   CO2 27 11/30/13 0452   GLUCOSE 115* 11-30-13 0452   BUN 14 30-Nov-2013 0452   CREATININE 0.46* November 30, 2013 0452   CALCIUM 8.8 30-Nov-2013 0452   PROT 5.4* 11/04/2013 0506   ALBUMIN 1.9* 11/04/2013 0506   AST 16 11/04/2013 0506   ALT 10 11/04/2013 0506   ALKPHOS 148* 11/04/2013 0506   BILITOT 0.2* 11/04/2013 0506    GFRNONAA >90 11-30-13 0452   GFRAA >90 November 30, 2013 0452   Lipase  No results found for this basename: lipase       Studies/Results: Dg Abd 2 Views  11/30/2013   CLINICAL DATA:  78 year old female with abdominal pain and distension. Initial encounter. Recent cholecystectomy.  EXAM: ABDOMEN - 2 VIEW  COMPARISON:  10/1913 and earlier.  FINDINGS: Continued small bilateral pleural effusions. Increased elevation of the left hemidiaphragm. Stable right abdominal drainage catheter. Stable skin staples. Mildly improved bowel gas pattern, only mild gaseous distension of some colon segments now. Overall the pattern is non obstructed. Stable visualized osseous structures. Left-side-down lateral decubitus view. No pneumoperitoneum.  IMPRESSION: 1. Improved, non obstructed bowel gas pattern.  No pneumoperitoneum.  2. Small bilateral pleural effusions, query worsening ventilation at the left lung base.   Electronically Signed   By: Augusto Gamble M.D.   On: 11/30/2013 08:00   Dg Abd 2 Views  11/03/2013   CLINICAL DATA:  Abdominal pain.  EXAM: ABDOMEN - 2 VIEW  COMPARISON:  Correlated with a CT scan dated 10/24/2013, and border soluble upper GI evaluation  FINDINGS: The bowel gas pattern is normal. There is no evidence of free air. No radio-opaque calculi or other significant radiographic abnormality is seen. Drainage catheter is appreciated within the right lower  quadrant. Surgical clip is appreciated within the right paraspinous region at the level of the L3 disc space  IMPRESSION: Nonobstructive bowel gas pattern.   Electronically Signed   By: Salome Holmes M.D.   On: 11/03/2013 14:48    Anti-infectives: Anti-infectives   Start     Dose/Rate Route Frequency Ordered Stop   11/01/13 1000  vancomycin (VANCOCIN) IVPB 750 mg/150 ml premix     750 mg 150 mL/hr over 60 Minutes Intravenous Every 12 hours 11/01/13 0910     10/26/13 0600  ceFAZolin (ANCEF) 3 g in dextrose 5 % 50 mL IVPB     3 g 160 mL/hr over  30 Minutes Intravenous On call to O.R. 10/25/13 1415 10/26/13 0830   10/25/13 2200  vancomycin (VANCOCIN) IVPB 1000 mg/200 mL premix  Status:  Discontinued     1,000 mg 200 mL/hr over 60 Minutes Intravenous Every 24 hours 10/24/13 2219 10/27/13 0922   10/25/13 0800  piperacillin-tazobactam (ZOSYN) IVPB 3.375 g     3.375 g 12.5 mL/hr over 240 Minutes Intravenous Every 8 hours 10/24/13 2218     10/24/13 2200  piperacillin-tazobactam (ZOSYN) IVPB 3.375 g     3.375 g 100 mL/hr over 30 Minutes Intravenous  Once 10/24/13 2156 10/24/13 2332   10/24/13 2200  vancomycin (VANCOCIN) IVPB 1000 mg/200 mL premix     1,000 mg 200 mL/hr over 60 Minutes Intravenous  Once 10/24/13 2156 10/25/13 0056       Assessment/Plan POD #10 s/p lap converted to open cholecystectomy with repair of choloduodenal and cholocolonic fistulas with abscess drainage and LOA  Right flank cellulitis - resolving, wbc normalizing  Ileus - improving per KUB  Hypothyroidism  ABL anemia - Hgb down a bit today after restarting Lovenox, but stable today  Plan:  1. Had a large BM yesterday and a small one this am and lots of flatus  2. KUB showed non-obstructive bowel gas pattern, clinically much improved despite patients complaints and frequently asking for pain medication 3. Advance to soft diet and wean TPN to 50% today then off tomorrow if taking better PO 4. DVT proph: ambulation, SCD's and lovenox 5. Need to work on getting her out, daughter wants SNF, but PT is recommending HH, Child psychotherapist on board. 6. The patient is likely suffering from depression and may benefit from CBT counseling at discharge and/or mental health eval 7. Restarted home meds including Buspar 8. D/c drain today, hopefully can be discharged tomorrow if tolerating a soft diet    LOS: 12 days    DORT, Knight Oelkers 11/05/2013, 9:12 AM Pager: [phone]

## 2013-11-06 MED ORDER — ADULT MULTIVITAMIN W/MINERALS CH
1.0000 | ORAL_TABLET | Freq: Every day | ORAL | Status: DC
  Administered 2013-11-06 – 2013-11-08 (×3): 1 via ORAL
  Filled 2013-11-06 (×5): qty 1

## 2013-11-06 MED ORDER — PANTOPRAZOLE SODIUM 40 MG PO TBEC
40.0000 mg | DELAYED_RELEASE_TABLET | Freq: Every day | ORAL | Status: DC
  Administered 2013-11-06 – 2013-11-08 (×3): 40 mg via ORAL
  Filled 2013-11-06 (×3): qty 1

## 2013-11-06 NOTE — Progress Notes (Signed)
11 Days Post-Op  Subjective: Pt feeling ok.  Passing flatus, having BM's.  Ambulating OOB with PT. Tolerating a soft diet and nutritional shakes.  Objective: Vital signs in last 24 hours: Temp:  [97.8 F (36.6 C)-98.4 F (36.9 C)] 97.8 F (36.6 C) (11/22 0559) Pulse Rate:  [80-89] 80 (11/22 0559) Resp:  [18] 18 (11/22 0559) BP: (117-121)/(52) 117/52 mmHg (11/22 0559) SpO2:  [96 %-97 %] 97 % (11/22 0559) Last BM Date: November 26, 2013  Intake/Output from previous day: Nov 27, 2023 0701 - 11/22 0700 In: 912.8 [IV Piggyback:250; TPN:657.8] Out: 750 [Urine:750] Intake/Output this shift:    PE: Gen:  Alert, NAD, pleasant Abd: Soft, minimally tender, ND, +BS, no HSM, thin midline horizontal incisional wound C/D/I   Lab Results:   Recent Labs  11/04/13 0506 Nov 26, 2013 0452  WBC 10.8* 10.9*  HGB 8.4* 8.4*  HCT 25.4* 25.6*  PLT 349 354   BMET  Recent Labs  11/04/13 0506 2013/11/26 0452  NA 137 135  K 3.7 3.6  CL 99 99  CO2 28 27  GLUCOSE 118* 115*  BUN 16 14  CREATININE 0.52 0.46*  CALCIUM 8.8 8.8   PT/INR No results found for this basename: LABPROT, INR,  in the last 72 hours CMP     Component Value Date/Time   NA 135 26-Nov-2013 0452   K 3.6 2013/11/26 0452   CL 99 11/26/13 0452   CO2 27 11/26/13 0452   GLUCOSE 115* November 26, 2013 0452   BUN 14 11-26-2013 0452   CREATININE 0.46* 2013/11/26 0452   CALCIUM 8.8 2013/11/26 0452   PROT 5.4* 11/04/2013 0506   ALBUMIN 1.9* 11/04/2013 0506   AST 16 11/04/2013 0506   ALT 10 11/04/2013 0506   ALKPHOS 148* 11/04/2013 0506   BILITOT 0.2* 11/04/2013 0506   GFRNONAA >90 Nov 26, 2013 0452   GFRAA >90 November 26, 2013 0452   Lipase  No results found for this basename: lipase       Studies/Results: Dg Abd 2 Views  Nov 26, 2013   CLINICAL DATA:  78 year old female with abdominal pain and distension. Initial encounter. Recent cholecystectomy.  EXAM: ABDOMEN - 2 VIEW  COMPARISON:  10/1913 and earlier.  FINDINGS: Continued small bilateral  pleural effusions. Increased elevation of the left hemidiaphragm. Stable right abdominal drainage catheter. Stable skin staples. Mildly improved bowel gas pattern, only mild gaseous distension of some colon segments now. Overall the pattern is non obstructed. Stable visualized osseous structures. Left-side-down lateral decubitus view. No pneumoperitoneum.  IMPRESSION: 1. Improved, non obstructed bowel gas pattern.  No pneumoperitoneum.  2. Small bilateral pleural effusions, query worsening ventilation at the left lung base.   Electronically Signed   By: Augusto Gamble M.D.   On: 11-26-13 08:00    Anti-infectives: Anti-infectives   Start     Dose/Rate Route Frequency Ordered Stop   11/01/13 1000  vancomycin (VANCOCIN) IVPB 750 mg/150 ml premix  Status:  Discontinued     750 mg 150 mL/hr over 60 Minutes Intravenous Every 12 hours 11/01/13 0910 11/06/13 0843   10/26/13 0600  ceFAZolin (ANCEF) 3 g in dextrose 5 % 50 mL IVPB     3 g 160 mL/hr over 30 Minutes Intravenous On call to O.R. 10/25/13 1415 10/26/13 0830   10/25/13 2200  vancomycin (VANCOCIN) IVPB 1000 mg/200 mL premix  Status:  Discontinued     1,000 mg 200 mL/hr over 60 Minutes Intravenous Every 24 hours 10/24/13 2219 10/27/13 0922   10/25/13 0800  piperacillin-tazobactam (ZOSYN) IVPB 3.375 g  Status:  Discontinued  3.375 g 12.5 mL/hr over 240 Minutes Intravenous Every 8 hours 10/24/13 2218 11/06/13 0843   10/24/13 2200  piperacillin-tazobactam (ZOSYN) IVPB 3.375 g     3.375 g 100 mL/hr over 30 Minutes Intravenous  Once 10/24/13 2156 10/24/13 2332   10/24/13 2200  vancomycin (VANCOCIN) IVPB 1000 mg/200 mL premix     1,000 mg 200 mL/hr over 60 Minutes Intravenous  Once 10/24/13 2156 10/25/13 0056       Assessment/Plan POD #11 s/p lap converted to open cholecystectomy with repair of choloduodenal and cholocolonic fistulas with abscess drainage and LOA  Right flank cellulitis - resolved, wbc normal Ileus - improved, tolerating a  diet and having BM's Hypothyroidism  ABL anemia - Hgb down a bit today after restarting Lovenox, but stable today  Plan:  1. Having BM's and tolerating diet 2. Encourage PO intake 3. Cont soft diet, d/c TPN 4. DVT proph: ambulation, SCD's and lovenox 5. Need to work on getting her out, daughter wants SNF, but PT is recommending HH, Child psychotherapist on board. 6. The patient is likely suffering from depression and may benefit from CBT counseling at discharge and/or mental health eval 7. home meds restarted, including Buspar     LOS: 13 days    [name] C. 11/06/2013, 8:43 AM

## 2013-11-06 NOTE — Progress Notes (Signed)
Clinical Social Worker (CSW) contacted daughter Cyana Shook [phone] to give skilled nursing facility bed offers. CSW explained to daughter the difference between skilled nursing facilities (SNF), assisted living facilities (ALF), and home health. Daughter verbalized her understanding. Per chart admissions coordinator at [facility] Maralyn Sago [place] has been attempting to reach the daughter to discuss SNF and ALF options. Daughter reported that she would call Maralyn Sago at [facility] back today. CSW left list of bed offers in patient's room for patient and daughter to review. CSW also put FL2 on shadow chart for MD to sign.   Jetta Lout, LCSWA Weekend CSW [phone]

## 2013-11-07 MED ORDER — ACETAMINOPHEN 325 MG PO TABS: 650.0000 mg | ORAL_TABLET | Freq: Four times a day (QID) | ORAL | Status: DC | PRN

## 2013-11-07 NOTE — Progress Notes (Signed)
12 Days Post-Op  Subjective: Pt doing well.  Passing flatus, having BM's.  Ambulating OOB with PT. Tolerating a soft diet and nutritional shakes.  Pt states she was able to drink 3 or 4 Ensures yesterday.  Objective: Vital signs in last 24 hours: Temp:  [97.5 F (36.4 C)-98.5 F (36.9 C)] 97.5 F (36.4 C) (11/23 0701) Pulse Rate:  [81-90] 81 (11/23 0701) Resp:  [17-19] 17 (11/23 0701) BP: (119-135)/(57-65) 119/64 mmHg (11/23 0701) SpO2:  [97 %-99 %] 97 % (11/23 0701) Last BM Date: 11/06/13  Intake/Output from previous day: 11/22 0701 - 11/23 0700 In: 420 [P.O.:360; I.V.:10; IV Piggyback:50] Out: 400 [Urine:400] Intake/Output this shift:    PE: Gen:  Alert, NAD, pleasant Abd: Soft, minimally tender, ND, +BS, no HSM, thin midline horizontal incisional wound C/D/I   Lab Results:   Recent Labs  11/05/13 0452  WBC 10.9*  HGB 8.4*  HCT 25.6*  PLT 354   BMET  Recent Labs  11/05/13 0452  NA 135  K 3.6  CL 99  CO2 27  GLUCOSE 115*  BUN 14  CREATININE 0.46*  CALCIUM 8.8   PT/INR No results found for this basename: LABPROT, INR,  in the last 72 hours CMP     Component Value Date/Time   NA 135 11/05/2013 0452   K 3.6 11/05/2013 0452   CL 99 11/05/2013 0452   CO2 27 11/05/2013 0452   GLUCOSE 115* 11/05/2013 0452   BUN 14 11/05/2013 0452   CREATININE 0.46* 11/05/2013 0452   CALCIUM 8.8 11/05/2013 0452   PROT 5.4* 11/04/2013 0506   ALBUMIN 1.9* 11/04/2013 0506   AST 16 11/04/2013 0506   ALT 10 11/04/2013 0506   ALKPHOS 148* 11/04/2013 0506   BILITOT 0.2* 11/04/2013 0506   GFRNONAA >90 11/05/2013 0452   GFRAA >90 11/05/2013 0452   Lipase  No results found for this basename: lipase       Studies/Results: No results found.  Anti-infectives: Anti-infectives   Start     Dose/Rate Route Frequency Ordered Stop   11/01/13 1000  vancomycin (VANCOCIN) IVPB 750 mg/150 ml premix  Status:  Discontinued     750 mg 150 mL/hr over 60 Minutes Intravenous Every  12 hours 11/01/13 0910 11/06/13 0843   10/26/13 0600  ceFAZolin (ANCEF) 3 g in dextrose 5 % 50 mL IVPB     3 g 160 mL/hr over 30 Minutes Intravenous On call to O.R. 10/25/13 1415 10/26/13 0830   10/25/13 2200  vancomycin (VANCOCIN) IVPB 1000 mg/200 mL premix  Status:  Discontinued     1,000 mg 200 mL/hr over 60 Minutes Intravenous Every 24 hours 10/24/13 2219 10/27/13 0922   10/25/13 0800  piperacillin-tazobactam (ZOSYN) IVPB 3.375 g  Status:  Discontinued     3.375 g 12.5 mL/hr over 240 Minutes Intravenous Every 8 hours 10/24/13 2218 11/06/13 0843   10/24/13 2200  piperacillin-tazobactam (ZOSYN) IVPB 3.375 g     3.375 g 100 mL/hr over 30 Minutes Intravenous  Once 10/24/13 2156 10/24/13 2332   10/24/13 2200  vancomycin (VANCOCIN) IVPB 1000 mg/200 mL premix     1,000 mg 200 mL/hr over 60 Minutes Intravenous  Once 10/24/13 2156 10/25/13 0056       Assessment/Plan POD #12 s/p lap converted to open cholecystectomy with repair of choloduodenal and cholocolonic fistulas with abscess drainage and LOA  Right flank cellulitis - resolved, wbc normal Ileus - improved, tolerating a diet and having BM's Hypothyroidism  ABL anemia - Hgb down  a bit today after restarting Lovenox, but stable now  Plan:  1. Having BM's and tolerating diet 2. Encourage PO intake 3. Cont soft diet, TPN off 4. DVT proph: ambulation, SCD's and lovenox 5. Dispo: daughter wants SNF, but PT is recommending HH, Child psychotherapist on board.   6. Possible depression: may benefit from CBT counseling at discharge and/or mental health eval 7. home meds restarted, including Buspar     LOS: 14 days    Dawn Haring C. 11/07/2013, 8:05 AM

## 2013-11-08 DIAGNOSIS — L03319 Cellulitis of trunk, unspecified: Secondary | ICD-10-CM

## 2013-11-08 MED ORDER — OXYCODONE HCL 5 MG PO TABS: 5.0000 mg | ORAL_TABLET | Freq: Four times a day (QID) | ORAL | Status: DC | PRN

## 2013-11-08 MED ORDER — DSS 100 MG PO CAPS: 100.0000 mg | ORAL_CAPSULE | Freq: Two times a day (BID) | ORAL | Status: DC | PRN

## 2013-11-08 MED ORDER — ENSURE COMPLETE PO LIQD: 237.0000 mL | Freq: Three times a day (TID) | ORAL | Status: DC

## 2013-11-08 MED ORDER — TRAMADOL HCL 50 MG PO TABS: 50.0000 mg | ORAL_TABLET | Freq: Four times a day (QID) | ORAL | Status: DC | PRN

## 2013-11-08 NOTE — Progress Notes (Signed)
PT Cancellation Note  Patient Details Name: [name] MRN: [id number] DOB: [birth year]   Cancelled Treatment:    Reason Eval/Treat Not Completed: Patient declined, no reason specified.  Patient preparing for discharge to [facility].  Patient declined PT today.  Will continue therapy at SNF.   Vena Austria 11/08/2013, 2:09 PM Durenda Hurt Renaldo Fiddler, [facility] Acute Rehab Services Pager [phone]

## 2013-11-08 NOTE — Progress Notes (Signed)
Per MD order, PICC line removed. Cath intact at 39cm. Vaseline pressure gauze to site, pressure held x 5min. No bleeding to site. Pt instructed to keep dressing CDI x 24 hours. Avoid heavy lifting, pushing or pulling x 24 hours,  If bleeding occurs hold pressure, if bleeding does not stop contact MD or go to the ED. Pt does not have any questions. Kristin Lamagna M  

## 2013-11-08 NOTE — Progress Notes (Signed)
13 Days Post-Op  Subjective: Pt feels great today.  +BM's and flatus, minimal abdominal pain.  No N/V.  Ambulating much better and mood improved.  Tolerating soft diet.  No complaints of skin redness.  Objective: Vital signs in last 24 hours: Temp:  [97.7 F (36.5 C)-98 F (36.7 C)] 97.7 F (36.5 C) (11/24 0520) Pulse Rate:  [81-87] 81 (11/24 0520) Resp:  [16-17] 16 (11/24 0520) BP: (115-121)/(54-60) 117/60 mmHg (11/24 0520) SpO2:  [94 %-100 %] 94 % (11/24 0520) Last BM Date: 11/07/13  Intake/Output from previous day: 11/23 0701 - 11/24 0700 In: 170 [I.V.:170] Out: 600 [Urine:600] Intake/Output this shift:    PE: Gen:  Alert, NAD, pleasant Abd: Soft, NT/ND, +BS, no HSM, no skin erythema or induration, incisional wound mostly healed, minimal skin separation, pink without drainage, right drain removed and site with minimal mucous drainage   Lab Results:  No results found for this basename: WBC, HGB, HCT, PLT,  in the last 72 hours BMET No results found for this basename: NA, K, CL, CO2, GLUCOSE, BUN, CREATININE, CALCIUM,  in the last 72 hours PT/INR No results found for this basename: LABPROT, INR,  in the last 72 hours CMP     Component Value Date/Time   NA 135 11/05/2013 0452   K 3.6 11/05/2013 0452   CL 99 11/05/2013 0452   CO2 27 11/05/2013 0452   GLUCOSE 115* 11/05/2013 0452   BUN 14 11/05/2013 0452   CREATININE 0.46* 11/05/2013 0452   CALCIUM 8.8 11/05/2013 0452   PROT 5.4* 11/04/2013 0506   ALBUMIN 1.9* 11/04/2013 0506   AST 16 11/04/2013 0506   ALT 10 11/04/2013 0506   ALKPHOS 148* 11/04/2013 0506   BILITOT 0.2* 11/04/2013 0506   GFRNONAA >90 11/05/2013 0452   GFRAA >90 11/05/2013 0452   Lipase  No results found for this basename: lipase       Studies/Results: No results found.  Anti-infectives: Anti-infectives   Start     Dose/Rate Route Frequency Ordered Stop   11/01/13 1000  vancomycin (VANCOCIN) IVPB 750 mg/150 ml premix  Status:   Discontinued     750 mg 150 mL/hr over 60 Minutes Intravenous Every 12 hours 11/01/13 0910 11/06/13 0843   10/26/13 0600  ceFAZolin (ANCEF) 3 g in dextrose 5 % 50 mL IVPB     3 g 160 mL/hr over 30 Minutes Intravenous On call to O.R. 10/25/13 1415 10/26/13 0830   10/25/13 2200  vancomycin (VANCOCIN) IVPB 1000 mg/200 mL premix  Status:  Discontinued     1,000 mg 200 mL/hr over 60 Minutes Intravenous Every 24 hours 10/24/13 2219 10/27/13 0922   10/25/13 0800  piperacillin-tazobactam (ZOSYN) IVPB 3.375 g  Status:  Discontinued     3.375 g 12.5 mL/hr over 240 Minutes Intravenous Every 8 hours 10/24/13 2218 11/06/13 0843   10/24/13 2200  piperacillin-tazobactam (ZOSYN) IVPB 3.375 g     3.375 g 100 mL/hr over 30 Minutes Intravenous  Once 10/24/13 2156 10/24/13 2332   10/24/13 2200  vancomycin (VANCOCIN) IVPB 1000 mg/200 mL premix     1,000 mg 200 mL/hr over 60 Minutes Intravenous  Once 10/24/13 2156 10/25/13 0056       Assessment/Plan POD #13 s/p lap converted to open cholecystectomy with repair of choloduodenal and cholocolonic fistulas with abscess drainage and LOA  Right flank cellulitis - resolved, wbc normal  Ileus - improved, tolerating a diet and having BM's  Hypothyroidism  ABL anemia - stable  Plan:  1.  Having BM's and tolerating diet  2. Encourage PO intake  3. Cont soft diet, TPN off  4. DVT proph: ambulation, SCD's and lovenox  5. Dispo: daughter looking at SNF options today 6. Possible depression: may benefit from CBT counseling at discharge and/or mental health eval  7. Home meds restarted, including Buspar, to discuss with family doctor regarding changing medications due to daughters concern of depression 8. Remove 1 staple today, hopefully SNF today 9. Will need QD Dry dressing changes to wound/old drain site (no longer needs WD)    LOS: 15 days    DORT, Amber Guthridge 11/08/2013, 8:54 AM Pager: [phone]

## 2013-11-08 NOTE — Progress Notes (Signed)
NUTRITION FOLLOW UP  DOCUMENTATION CODES Per approved criteria  -Severe malnutrition in the context of chronic illness -Underweight   Intervention:   1. Supplements; continue Ensure Complete 4 times daily at discharge as this continues to be the majority of pt's intake and she is tolerating well.   Nutrition Dx:   Inadequate oral intake now related to decreased appetite and early satiety as evidenced by poor intake, ongoing  Goal:   TPN to meet > 90% of estimated nutrition needs, discontinued.  New goal: Food/Beverage; pt meeting >/=90% estimated needs with tolerance.   Monitor:   PO diet advancement & intake, weight, labs, I/O's, supplement accepance  Assessment:   Patient with recent history of abdominal abscess and chronic cholecystitis; + nausea, vomiting and progressive weakness PTA; in ED found to be febrile with leukocytosis and tachycardia; IVF and ABX initiated; surgery consulted and patient admitted for further management.  Patient s/p procedures 11/11: LYSIS OF ADHESIONS REPAIR OF CHOLECOLONIC FISTULA DRAINAGE OF PELVIC ABSCESS REPAIR OF CHOLECYSTODUODENAL FISTULA  TPN started 11/12 and discontinued 11/23.    Pt has continued with 3-4 Ensures daily.  She is tolerating solid foods however intake remains decreased at </=25% of meals.  Pt is to d/c to SNF today.   Recommend continuing with frequent wt checks.    Height: Ht Readings from Last 1 Encounters:  10/25/13 5\' 3"  (1.6 m)    Weight Status:   Wt Readings from Last 1 Encounters:  10/30/13 105 lb 6.4 oz (47.809 kg)  Admission weight 102 lb (46.7 kg)  Body mass index is 18.68 kg/(m^2).  Re-estimated needs:  Kcal: 1400-1600 Protein: 80-90 gm Fluid: >/= 1.5 L  Skin: abdominal surgical incision   Diet Order: Dysphagia 3   Intake/Output Summary (Last 24 hours) at 11/08/13 1246 Last data filed at 11/07/13 1940  Gross per 24 hour  Intake    160 ml  Output    600 ml  Net   -440 ml     Labs:   Recent Labs Lab 11/02/13 0500 11/04/13 0506 11/05/13 0452  NA 133* 137 135  K 3.8 3.7 3.6  CL 97 99 99  CO2 26 28 27   BUN 16 16 14   CREATININE 0.50 0.52 0.46*  CALCIUM 8.6 8.8 8.8  MG 1.9 1.7  --   PHOS  --  4.1  --   GLUCOSE 133* 118* 115*    Scheduled Meds: . busPIRone  10 mg Oral BID  . enoxaparin (LOVENOX) injection  40 mg Subcutaneous Q24H  . ezetimibe-simvastatin  1 tablet Oral QHS  . feeding supplement (ENSURE COMPLETE)  237 mL Oral TID WC & HS  . levothyroxine  75 mcg Oral QAC breakfast  . loratadine  10 mg Oral Daily  . losartan  50 mg Oral Daily  . multivitamin with minerals  1 tablet Oral Daily  . pantoprazole  40 mg Oral Q1200  . sodium chloride  10-40 mL Intracatheter Q12H  . traMADol  50 mg Oral Q6H    Continuous Infusions:    Loyce Dys, MS RD LDN Clinical Inpatient Dietitian Pager: [phone] Weekend/After hours pager: [phone]

## 2013-11-08 NOTE — Discharge Summary (Signed)
Physician Discharge Summary  Patient ID: [name] MRN: [id number] DOB/AGE: 1935/04/19 78 y.o.  Admit date: 10/24/2013 Discharge date: 11/08/2013  Admitting Diagnosis: Severe protein-calorie malnutrition Acute calculus cholecystitis Hypothyroidism  Discharge Diagnosis Patient Active Problem List   Diagnosis Date Noted  . Cellulitis of flank 11/08/2013  . Severe protein-calorie malnutrition 10/09/2013    Class: Chronic  . Tobacco use disorder 10/09/2013    Class: Chronic  . Diarrhea 10/09/2013    Class: Acute  . Pericolonic abscess due to diverticulitis 10/06/2013  . Diverticular disease small and large intestine, perforation, abscess 10/01/2013  . Calculus of gallbladder with acute cholecystitis, without mention of obstruction 10/01/2013  . Loss of weight 10/01/2013  . Goiter 10/01/2013    Consultants None this admission  Imaging: No results found.   Procedures Dr. Luisa Hart (10/26/13): 1. Acute on chronic cholecystitis with gangrenous gallbladder and  abscess.  2. 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  3. Fistula to transverse colon from gallbladder.  4. Small-bowel obstruction from pelvic adhesion secondary to previous diverticular abscess resulting from diverticulitis.   Hospital Course:  78 year old female who was recently hospitalized (10/01/13 - 10/28) secondary to weakness and diverticular abscess as well as chronic cholecystitis. Patient underwent drain placement which helped resolve her diverticular abscess. The patient underwent MRI to evaluate her gallbladder as well as for possible cancer. The conclusion was that she had chronic cholecystitis. The plan at that time was to have her follow up as an outpatient secondary to her poor nutritional status for outpatient cholecystectomy.  Since being discharged from the hospital the patient had minimal output has been on an Ensure diet. 24 hours prior to admission (10/24/13) patient had some weakness as well as  some nausea and minimal emesis. There also seems to be some mental status changes.    Patient was admitted and underwent procedure listed above on 10/26/13.  Tolerated procedure well and was transferred to the floor.  She had a prolonged post-op ileus.  She was kept on TPN due to low PO intake until ileus started to resolve.  Patient had significant post-op pain which eventually was controlled with a combination of tylenol, muscle relaxer's, and narcotics.  Narcotics were weaned.  She developed right sided abdominal cellulitis which resolved with zosyn and vanc.  Diet was advanced as tolerated.  WBC improved and normalized on 11/04/13.  Her ileus started to resolve and she was started on solid food on 11/05/13.  Her wound improved significantly was switched from WD dressings to dry dressings.  We were concerned about the patients depressed mood during her hospital stay, thus she will be referred to her PCP upon discharge for a follow up regarding her symptoms.  On POD #13, the patient was voiding well, tolerating diet, ambulating well, pain well controlled, vital signs stable, incisions c/d/i and felt stable for discharge to a SNF.  Patient will follow up in our office in 2-3 weeks and knows to call with questions or concerns.    Physical Exam: General:  Alert, NAD, pleasant, comfortable Abd:  Soft, ND, mild tenderness, midline horizontal wound skin is nearly closed and clean and dry, JP drain removed prior to d/c, staples removed.    Medication List    STOP taking these medications       ciprofloxacin 500 MG tablet  Commonly known as:  CIPRO      TAKE these medications       busPIRone 10 MG tablet  Commonly known as:  BUSPAR  Take 10  mg by mouth 2 (two) times daily.     DSS 100 MG Caps  Take 100 mg by mouth 2 (two) times daily as needed for mild constipation or moderate constipation.     ezetimibe-simvastatin 10-80 MG per tablet  Commonly known as:  VYTORIN  Take 1 tablet by mouth at  bedtime.     feeding supplement (ENSURE COMPLETE) Liqd  Take 237 mLs by mouth 2 (two) times daily between meals.     feeding supplement (ENSURE COMPLETE) Liqd  Take 237 mLs by mouth 4 (four) times daily -  with meals and at bedtime.     levothyroxine 75 MCG tablet  Commonly known as:  SYNTHROID, LEVOTHROID  Take 75 mcg by mouth daily before breakfast.     loratadine 10 MG tablet  Commonly known as:  CLARITIN  Take 10 mg by mouth daily.     losartan 50 MG tablet  Commonly known as:  COZAAR  Take 50 mg by mouth daily.     multivitamin with minerals Tabs tablet  Take 1 tablet by mouth daily.     oxyCODONE 5 MG immediate release tablet  Commonly known as:  Oxy IR/ROXICODONE  Take 1-2 tablets (5-10 mg total) by mouth every 6 (six) hours as needed for severe pain or breakthrough pain.     potassium chloride 20 MEQ packet  Commonly known as:  KLOR-CON  Take 20 mEq by mouth daily.     promethazine 25 MG tablet  Commonly known as:  PHENERGAN  Take 25 mg by mouth every 6 (six) hours as needed for nausea or vomiting.     traMADol 50 MG tablet  Commonly known as:  ULTRAM  Take 1 tablet (50 mg total) by mouth every 6 (six) hours as needed (mild to moderate pain).     zaleplon 10 MG capsule  Commonly known as:  SONATA  Take 10 mg by mouth at bedtime.             Follow-up Information   Follow up with CORNETT,THOMAS A., MD In 2 weeks.   Specialty:  General Surgery   Contact information:   [address] [place] [address] [phone]       Schedule an appointment as soon as possible for a visit with Julian Hy, MD. (Regarding your depressive symptoms)    Specialty:  Endocrinology   Contact information:   [address] [place] [address] [phone]       Signed: Aris Georgia [facility] Surgery [phone]  11/08/2013, 11:33 AM

## 2013-11-08 NOTE — Clinical Social Work Placement (Signed)
Clinical Social Work Department CLINICAL SOCIAL WORK PLACEMENT NOTE 11/08/2013  Patient:  [name], [name]  Account Number:  [id number] Admit date:  10/24/2013  Clinical Social Worker:  Irving Burton [place], LCSWA  Date/time:  11/05/2013 11:34 AM  Clinical Social Work is seeking post-discharge placement for this patient at the following level of care:   SKILLED NURSING   (*CSW will update this form in Epic as items are completed)   11/05/2013  Patient/family provided with [facility] Department of Clinical Social Works list of facilities offering this level of care within the geographic area requested by the patient (or if unable, by the patients family).  11/05/2013  Patient/family informed of their freedom to choose among providers that offer the needed level of care, that participate in Medicare, Medicaid or managed care program needed by the patient, have an available bed and are willing to accept the patient.  11/05/2013  Patient/family informed of [facility] ownership interest in [facility], as well as of the fact that they are under no obligation to receive care at this facility.  PASARR submitted to EDS on 11/05/2013 PASARR number received from EDS on 11/05/2013  FL2 transmitted to all facilities in geographic area requested by pt/family on  11/05/2013 FL2 transmitted to all facilities within larger geographic area on   Patient informed that his/her managed care company has contracts with or will negotiate with  certain facilities, including the following:     Patient/family informed of bed offers received:  11/06/2013 Patient chooses bed at [facility] Physician recommends and patient chooses bed at  [facility]  Patient to be transferred to [facility] on  11/08/2013 Patient to be transferred to facility by family  The following physician request were entered in Epic:   Additional Comments:  Darlyn Chamber, Theresia Majors Clinical Social  Worker [phone]

## 2013-11-08 NOTE — Discharge Planning (Signed)
Patient discharged to SNF in stable condition. Transported via family.

## 2013-11-08 NOTE — Clinical Social Work Note (Signed)
CSW received a call from Dawn daughter, [name] ([phone]), in regards to Dawn discharge disposition. [name] stated that she has been working with [facility], and that the facility has offered a bed to the pt once pt is medically stable for discharge. CSW contacted [facility] to confirm bed offer. [facility] representative currently at bedside meeting with [name] to sign admission paperwork. CSW informed PA of information above. CSW to continue to assist with discharge to [facility] on 11/08/2013.  [name] requested to CSW for pt to be driven by family to SNF, PA confirmed that family could provide transportation for pt. [facility] SN[facility] aware of family providing transportation for pt.  Darlyn Chamber, LCSWA Clinical Social Worker [phone]

## 2013-11-09 ENCOUNTER — Other Ambulatory Visit: Payer: Self-pay

## 2013-11-09 MED ORDER — ZALEPLON 10 MG PO CAPS: 10.0000 mg | ORAL_CAPSULE | Freq: Every day | ORAL | Status: DC

## 2013-11-10 ENCOUNTER — Non-Acute Institutional Stay (SKILLED_NURSING_FACILITY): Payer: BC Managed Care – PPO | Admitting: Internal Medicine

## 2013-11-10 DIAGNOSIS — I10 Essential (primary) hypertension: Secondary | ICD-10-CM

## 2013-11-10 DIAGNOSIS — E43 Unspecified severe protein-calorie malnutrition: Secondary | ICD-10-CM

## 2013-11-10 DIAGNOSIS — K8000 Calculus of gallbladder with acute cholecystitis without obstruction: Secondary | ICD-10-CM

## 2013-11-10 DIAGNOSIS — E039 Hypothyroidism, unspecified: Secondary | ICD-10-CM

## 2013-11-15 ENCOUNTER — Non-Acute Institutional Stay (SKILLED_NURSING_FACILITY): Payer: BC Managed Care – PPO | Admitting: Adult Health

## 2013-11-15 DIAGNOSIS — K5730 Diverticulosis of large intestine without perforation or abscess without bleeding: Secondary | ICD-10-CM

## 2013-11-15 DIAGNOSIS — K5900 Constipation, unspecified: Secondary | ICD-10-CM

## 2013-11-15 DIAGNOSIS — F3289 Other specified depressive episodes: Secondary | ICD-10-CM

## 2013-11-15 DIAGNOSIS — F329 Major depressive disorder, single episode, unspecified: Secondary | ICD-10-CM

## 2013-11-15 DIAGNOSIS — I10 Essential (primary) hypertension: Secondary | ICD-10-CM

## 2013-11-15 DIAGNOSIS — E43 Unspecified severe protein-calorie malnutrition: Secondary | ICD-10-CM

## 2013-11-15 DIAGNOSIS — E785 Hyperlipidemia, unspecified: Secondary | ICD-10-CM

## 2013-11-15 DIAGNOSIS — K5740 Diverticulitis of both small and large intestine with perforation and abscess without bleeding: Secondary | ICD-10-CM

## 2013-11-15 DIAGNOSIS — E039 Hypothyroidism, unspecified: Secondary | ICD-10-CM

## 2013-11-15 DIAGNOSIS — G4700 Insomnia, unspecified: Secondary | ICD-10-CM

## 2013-11-15 DIAGNOSIS — F32A Depression, unspecified: Secondary | ICD-10-CM

## 2013-11-16 NOTE — Progress Notes (Signed)
Patient ID: [name], female   DOB: 1935/03/22, 78 y.o.   MRN: [id number]              PROGRESS NOTE  DATE: 11/15/2013   FACILITY: [facility]  LEVEL OF CARE: SNF (31)  Acute Visit  CHIEF COMPLAINT:  Discharge Notes  HISTORY OF PRESENT ILLNESS: This is a 78 year old female who is for discharge home with Home health PT and Nursing. She has been admitted to [facility] on 11/08/13 from [facility] with discharge diagnoses of Cellulitis of Flank which is now resolved. She had Diverticular disease, small and large intestine, perforation, abscess S/P drain placement. Patient was admitted to this facility for short-term rehabilitation after the patient's recent hospitalization.  Patient has completed SNF rehabilitation and therapy has cleared the patient for discharge.  Reassessment of ongoing problem(s):  HTN: Pt 's HTN remains stable.  Denies CP, sob, DOE, pedal edema, headaches, dizziness or visual disturbances.  No complications from the medications currently being used.  Last BP : 137/68  HYPOTHYROIDISM: The hypothyroidism remains stable. No complications noted from the medications presently being used.  The patient denies fatigue or constipation.  11/14 TSH 4.2760  DEPRESSION: The depression remains stable. Patient denies ongoing feelings of sadness, insomnia, anedhonia or lack of appetite. No complications reported from the medications currently being used. Staff do not report behavioral problems.  PAST MEDICAL HISTORY : Reviewed.  No changes.  CURRENT MEDICATIONS: Reviewed per [facility]  REVIEW OF SYSTEMS:  GENERAL: no change in appetite, no fatigue, no weight changes, no fever, chills or weakness RESPIRATORY: no cough, SOB, DOE, wheezing, hemoptysis CARDIAC: no chest pain, edema or palpitations GI: no abdominal pain, diarrhea, constipation, heart burn, nausea or vomiting  PHYSICAL EXAMINATION  VS:  T98.6       P90       RR20      BP137/68      POX95  %       WT101.2 (Lb)  GENERAL: no acute distress, normal body habitus EYES: conjunctivae normal, sclerae normal, normal eye lids NECK: supple, trachea midline, no neck masses, no thyroid tenderness, no thyromegaly LYMPHATICS: no LAN in the neck, no supraclavicular LAN RESPIRATORY: breathing is even & unlabored, BS CTAB CARDIAC: RRR, no murmur,no extra heart sounds, no edema GI: abdomen soft, normal BS, no masses, no tenderness, no hepatomegaly, no splenomegaly PSYCHIATRIC: the patient is alert & oriented to person, affect & behavior appropriate  LABS/RADIOLOGY: 11/09/13 WBC 11.7 hemoglobin 9.5 hematocrit 31.1 sodium 135 potassium 4.3 glucose 123 BUN 10 creatinine 0.5 calcium 9.3 TSH 4.2760   ASSESSMENT/PLAN:  Diverticular disease, small and large intestine, perforation, abscess status post drain placement - for home health PT and nursing  Hyperlipidemia - continue Vytorin  Hypothyroidism - continue Synthroid  Depression - stable; continue BuSpar  Insomnia - continue Sonata  Constipation - continue Colace  Hypertension - well controlled; continue Cozaar  Severe protein calorie malnutrition - continue Remeron to increase appetite    I have filled out patient's discharge paperwork and written prescriptions.  Patient will receive home health PT and Nursing.   Total discharge time: Less than 30 minutes Discharge time involved coordination of the discharge process with Child psychotherapist, nursing staff and therapy department. Medical justification for home health services verified.  CPT CODE: 08657

## 2013-11-19 DIAGNOSIS — L03319 Cellulitis of trunk, unspecified: Secondary | ICD-10-CM

## 2013-11-19 DIAGNOSIS — Z48815 Encounter for surgical aftercare following surgery on the digestive system: Secondary | ICD-10-CM

## 2013-11-19 DIAGNOSIS — L02219 Cutaneous abscess of trunk, unspecified: Secondary | ICD-10-CM

## 2013-11-19 DIAGNOSIS — Z8719 Personal history of other diseases of the digestive system: Secondary | ICD-10-CM

## 2013-11-19 DIAGNOSIS — Z48814 Encounter for surgical aftercare following surgery on the teeth or oral cavity: Secondary | ICD-10-CM

## 2013-11-19 DIAGNOSIS — E039 Hypothyroidism, unspecified: Secondary | ICD-10-CM

## 2013-11-23 ENCOUNTER — Encounter (INDEPENDENT_AMBULATORY_CARE_PROVIDER_SITE_OTHER): Payer: BC Managed Care – PPO | Admitting: Surgery

## 2013-11-29 ENCOUNTER — Ambulatory Visit (INDEPENDENT_AMBULATORY_CARE_PROVIDER_SITE_OTHER): Payer: Medicare Other | Admitting: Surgery

## 2013-11-29 ENCOUNTER — Encounter (INDEPENDENT_AMBULATORY_CARE_PROVIDER_SITE_OTHER): Payer: Self-pay | Admitting: Surgery

## 2013-11-29 VITALS — BP 110/70 | HR 80 | Temp 98.7°F | Resp 14 | Ht 63.0 in | Wt 100.2 lb

## 2013-11-29 DIAGNOSIS — Z9889 Other specified postprocedural states: Secondary | ICD-10-CM | POA: Insufficient documentation

## 2013-11-29 NOTE — Patient Instructions (Signed)
Increase activity each day.  Appetite and strength will slowly return over the next 3 months.   Return as needed.

## 2013-11-29 NOTE — Progress Notes (Signed)
Patient is one month out from open cholecystectomy due to chronic cholecystitis with fistula to her duodenum and transverse colon. She also underwent lysis of adhesions secondary to diverticular inflammation and drainage of chronic pelvic abscess. Her postoperative course was complicated by flank cellulitis from her drain site and this is resolved. She is doing well and out of skilled nursing. Her strength is slowly coming back as well as her appetite.  Exam: Right subcostal incision is closed. No signs of infection or hernia. Soft and flat.  Impression: Status post open cholecystectomy secondary to chronic cholecystitis and fistula to transverse colon and duodenum and lysis of pelvic adhesions and washout of chronic pelvic abscess  Plan: Encourage more activity. Encourage more by mouth intake. No restrictions at this point. Return to clinic as needed.

## 2013-12-27 ENCOUNTER — Encounter: Payer: Self-pay | Admitting: Internal Medicine

## 2013-12-27 DIAGNOSIS — E039 Hypothyroidism, unspecified: Secondary | ICD-10-CM | POA: Insufficient documentation

## 2013-12-27 DIAGNOSIS — I10 Essential (primary) hypertension: Secondary | ICD-10-CM | POA: Insufficient documentation

## 2013-12-27 NOTE — Progress Notes (Signed)
Patient ID: [name], female   DOB: 1935-02-26, 79 y.o.   MRN: [id number]               HISTORY & PHYSICAL  DATE: 11/10/2013     FACILITY: [facility]  LEVEL OF CARE: SNF (31)  ALLERGIES:  Allergies  Allergen Reactions  . Ambien [Zolpidem Tartrate] Other (See Comments)    Makes her unsteady  . Cymbalta [Duloxetine Hcl] Other (See Comments)    "kinda went nuts"  . Halcion [Triazolam] Other (See Comments)    "kinda went nuts"    CHIEF COMPLAINT:  Manage acute cholecystitis, hypothyroidism, and hypertension.    HISTORY OF PRESENT ILLNESS:  The patient is a 79 year-old, Caucasian female who was initially hospitalized for a diverticular abscess and chronic cholecystitis.  After hospitalization, she is admitted to this facility for short-term rehabilitation.  She has the following problems:    CHOLECYSTITIS:  On MRI, it was diagnosed as a chronic cholecystitis.  She underwent a cholecystectomy and tolerated the procedure well.  She denies abdominal pain, nausea or vomiting.    HYPOTHYROIDISM: The hypothyroidism remains stable. No complications noted from the medications presently being used.  The patient denies fatigue or constipation.  Last TSH:  Not available.      HTN: Pt 's HTN remains stable.  Denies CP, sob, DOE, pedal edema, headaches, dizziness or visual disturbances.  No complications from the medications currently being used.  Last BP :   126/72.    PAST MEDICAL HISTORY :  Past Medical History  Diagnosis Date  . Hypothyroidism   . Hyperlipemia   . Hypertension     "was taking RX; took me off it 09/2013" (10/25/2013)  . Seasonal allergies   . History of diverticular abscess 09/2013  . Depression     "sold beach house 01/2012" (10/25/2013)  . Calculus of gallbladder with acute cholecystitis, without mention of obstruction 10/01/2013    Open chole on 10/26/13. Patient had gangrenous cholecystitis with cholecystoduodenal fistula and cholecystocolonic  fistula     PAST SURGICAL HISTORY: Past Surgical History  Procedure Laterality Date  . Cataract extraction w/ intraocular lens implant Left ~ 2010  . Abcess drainage      "had drain put in & stayed for ~ 1 wk to 10 day; just came out ~ 10 days ago" (10/25/2013)  . Cholecystectomy N/A 10/26/2013    Procedure: ATTEMPTED  LAPAROSCOPIC CONVERTED TO OPEN CHOLECYSTECTOMY WITH INTRAOPERATIVE CHOLANGIOGRAM; REPAIR OF CHOLECOLONIC FISTULA; cholecystoduodenal fistula repair; lysis of adhesions; drainage of pelvic abscess; omental patching of duodenal repair;  Surgeon: Joyice Faster. Cornett, MD;  Location: [place];  Service: General;  Laterality: N/A;    SOCIAL HISTORY:  reports that she has been smoking Cigarettes.  She has a 13.75 pack-year smoking history. She has never used smokeless tobacco. She reports that she drinks about 8.4 ounces of alcohol per week. She reports that she does not use illicit drugs.  FAMILY HISTORY: None  CURRENT MEDICATIONS: Reviewed per MAR  REVIEW OF SYSTEMS:  See HPI otherwise 14 point ROS is negative.  PHYSICAL EXAMINATION  VS:  T 96.5       P 71      RR 20      BP 126/72      POX 90%        WT (Lb)  GENERAL: no acute distress, thin body habitus EYES: conjunctivae normal, sclerae normal, normal eye lids MOUTH/THROAT: lips without lesions,no lesions in the  mouth,tongue is without lesions,uvula elevates in midline NECK: supple, trachea midline, no neck masses, no thyroid tenderness, no thyromegaly LYMPHATICS: no LAN in the neck, no supraclavicular LAN RESPIRATORY: breathing is even & unlabored, BS CTAB CARDIAC: RRR, no murmur,no extra heart sounds, no edema GI:  ABDOMEN: abdomen soft, normal BS, no masses, no tenderness  LIVER/SPLEEN: no hepatomegaly, no splenomegaly MUSCULOSKELETAL: HEAD: normal to inspection & palpation BACK: no kyphosis, scoliosis or spinal processes tenderness EXTREMITIES: LEFT UPPER EXTREMITY: full range of motion, normal strength &  tone RIGHT UPPER EXTREMITY:  full range of motion, normal strength & tone LEFT LOWER EXTREMITY:  full range of motion, normal strength & tone RIGHT LOWER EXTREMITY:  full range of motion, normal strength & tone PSYCHIATRIC: the patient is alert & oriented to person, affect & behavior appropriate  LABS/RADIOLOGY:  Labs reviewed: Basic Metabolic Panel:  Recent Labs  10/30/13 0335  11/01/13 0447 11/02/13 0500 11/04/13 0506 11/05/13 0452  NA 135  < > 134* 133* 137 135  K 3.3*  < > 3.5 3.8 3.7 3.6  CL 99  < > 96 97 99 99  CO2 27  < > 26 26 28 27   GLUCOSE 134*  < > 124* 133* 118* 115*  BUN 5*  < > 12 16 16 14   CREATININE 0.43*  < > 0.42* 0.50 0.52 0.46*  CALCIUM 8.5  < > 9.1 8.6 8.8 8.8  MG 1.7  < > 1.7 1.9 1.7  --   PHOS 3.2  --  4.1  --  4.1  --   < > = values in this interval not displayed. Liver Function Tests:  Recent Labs  10/29/13 0546 11/01/13 0447 11/04/13 0506  AST 16 15 16   ALT 18 11 10   ALKPHOS 128* 145* 148*  BILITOT 0.5 0.5 0.2*  PROT 5.0* 6.2 5.4*  ALBUMIN 1.9* 2.2* 1.9*   CBC:  Recent Labs  10/12/13 0605 10/24/13 2055  11/02/13 0500 11/04/13 0506 11/05/13 0452  WBC 10.8* 15.7*  < > 14.6* 10.8* 10.9*  NEUTROABS  --  12.3*  --   --   --   --   HGB 10.1* 10.9*  < > 9.2* 8.4* 8.4*  HCT 29.8* 31.7*  < > 27.0* 25.4* 25.6*  MCV 97.4 92.7  < > 90.3 91.7 92.4  PLT 393 368  < > 351 349 354  < > = values in this interval not displayed.  CBG:  Recent Labs  10/29/13 2350 10/30/13 0358 10/30/13 0618  GLUCAP 133* 127* 124*    ASSESSMENT/PLAN:  Acute cholecystitis.  Status post cholecystectomy.  Continue rehabilitation.    Hypothyroidism.  Check TSH.    Hypertension.  Well controlled.    Severe protein calorie malnutrition.  Continue nutritional supplements.  We will also start Remeron 7.5 mg q.h.s. as her appetite is very poor.    Depression.  Currently on Buspar.    Hypokalemia.  Continue potassium supplementation.    Check CBC and BMP.     THN Metrics:   Current smoker.  Not on aspirin.  Blood pressure:  126/72.    I have reviewed patient's medical records received at admission/from hospitalization.  CPT CODE: 13244

## 2014-01-12 ENCOUNTER — Telehealth (INDEPENDENT_AMBULATORY_CARE_PROVIDER_SITE_OTHER): Payer: Self-pay

## 2014-01-12 NOTE — Telephone Encounter (Signed)
No notes in encounter. 

## 2014-01-13 ENCOUNTER — Encounter (INDEPENDENT_AMBULATORY_CARE_PROVIDER_SITE_OTHER): Payer: Self-pay | Admitting: Surgery

## 2014-01-13 ENCOUNTER — Ambulatory Visit (INDEPENDENT_AMBULATORY_CARE_PROVIDER_SITE_OTHER): Payer: BC Managed Care – PPO | Admitting: Surgery

## 2014-01-13 VITALS — BP 103/61 | HR 69 | Temp 98.6°F | Resp 16 | Ht 63.0 in | Wt 100.6 lb

## 2014-01-13 DIAGNOSIS — L723 Sebaceous cyst: Secondary | ICD-10-CM

## 2014-01-13 DIAGNOSIS — L089 Local infection of the skin and subcutaneous tissue, unspecified: Secondary | ICD-10-CM | POA: Insufficient documentation

## 2014-01-13 MED ORDER — HYDROCODONE-ACETAMINOPHEN 10-325 MG PO TABS: 1.0000 | ORAL_TABLET | Freq: Every day | ORAL | Status: DC

## 2014-01-13 NOTE — Progress Notes (Signed)
Urgent Office  This patient is status post a very complicated open cholecystectomy for gangrenous cholecystitis with a cholecysto- duodenal and cholecysto-colonic fistula.  This was in November.  She presents with a new problem. Last week she has had some progressive swelling, redness, and tenderness just under the skin in her right upper quadrant just below the costal margin. This is above her abdominal incision. She did not have any drains in this area. She was seen yesterday by Dr. Forde Dandy who started her on doxycycline.  Filed Vitals:   01/13/14 1458  BP: 103/61  Pulse: 69  Temp: 98.6 F (37 C)  Resp: 16   The skin under her right costal margin shows a 6 cm area of erythema with tenderness fluctuance. There is a small pinpoint opening that is oozing some purulent material. We prepped this area with Betadine and anesthetized with 1% lidocaine. I made a 2 cm incision across this area. We expressed a large amount of purulent sebum. The erythema resolved fairly quickly. We packed the wound with 1/4 inch Nu Gauze. Her husband is instructed to remove the packing in 24 hours and to treat this area with Neosporin. We will recheck her next week. I gave her a prescription for some pain medication.  [name]. Georgette Dover, MD, [facility] Surgery  General/ Trauma Surgery  01/13/2014 4:24 PM

## 2014-01-21 ENCOUNTER — Ambulatory Visit (INDEPENDENT_AMBULATORY_CARE_PROVIDER_SITE_OTHER): Payer: BC Managed Care – PPO | Admitting: Surgery

## 2014-01-21 ENCOUNTER — Encounter (INDEPENDENT_AMBULATORY_CARE_PROVIDER_SITE_OTHER): Payer: Self-pay | Admitting: Surgery

## 2014-01-21 VITALS — BP 104/60 | HR 84 | Temp 97.6°F | Resp 14 | Ht 63.0 in | Wt 101.2 lb

## 2014-01-21 DIAGNOSIS — L723 Sebaceous cyst: Secondary | ICD-10-CM

## 2014-01-21 DIAGNOSIS — L089 Local infection of the skin and subcutaneous tissue, unspecified: Secondary | ICD-10-CM

## 2014-01-21 NOTE — Progress Notes (Signed)
Status post incision and drainage of an infected sebaceous cyst on the right side of her abdomen on 01/13/14. The wound is healed and is no longer draining. No tenderness. She is no longer putting a dressing on this area. The inflammation seems to be resolving. If the cyst re\re accumulates I instructed her to call his back for an elective excision. Otherwise we will see her back on a when necessary basis.  [name]. Dawn Dover, MD, [facility] Surgery  General/ Trauma Surgery  01/21/2014 11:21 AM

## 2014-05-18 ENCOUNTER — Encounter: Payer: Self-pay | Admitting: Internal Medicine

## 2014-06-07 ENCOUNTER — Other Ambulatory Visit: Payer: Self-pay | Admitting: Endocrinology

## 2014-06-07 DIAGNOSIS — K8042 Calculus of bile duct with acute cholecystitis without obstruction: Secondary | ICD-10-CM

## 2014-06-07 DIAGNOSIS — R109 Unspecified abdominal pain: Secondary | ICD-10-CM

## 2014-06-07 DIAGNOSIS — K5712 Diverticulitis of small intestine without perforation or abscess without bleeding: Secondary | ICD-10-CM

## 2014-06-10 ENCOUNTER — Ambulatory Visit
Admission: RE | Admit: 2014-06-10 | Discharge: 2014-06-10 | Disposition: A | Discharge status: Home / Self Care | Payer: Medicare Other | Source: Ambulatory Visit | Attending: Endocrinology | Admitting: Endocrinology

## 2014-06-10 DIAGNOSIS — K8042 Calculus of bile duct with acute cholecystitis without obstruction: Secondary | ICD-10-CM

## 2014-06-10 DIAGNOSIS — R109 Unspecified abdominal pain: Secondary | ICD-10-CM

## 2014-06-10 DIAGNOSIS — K5712 Diverticulitis of small intestine without perforation or abscess without bleeding: Secondary | ICD-10-CM

## 2014-06-10 MED ORDER — IOHEXOL 300 MG/ML  SOLN
100.0000 mL | Freq: Once | INTRAMUSCULAR | Status: AC | PRN
  Administered 2014-06-10: 100 mL via INTRAVENOUS

## 2014-06-29 IMAGING — CR DG CHEST 2V
2 series · 2 of 2 positions shown · non-contrast
Comparison: None.

CLINICAL DATA: Weakness for 3-4 weeks, smoking history

EXAM:
CHEST  2 VIEW

[w chest pa]
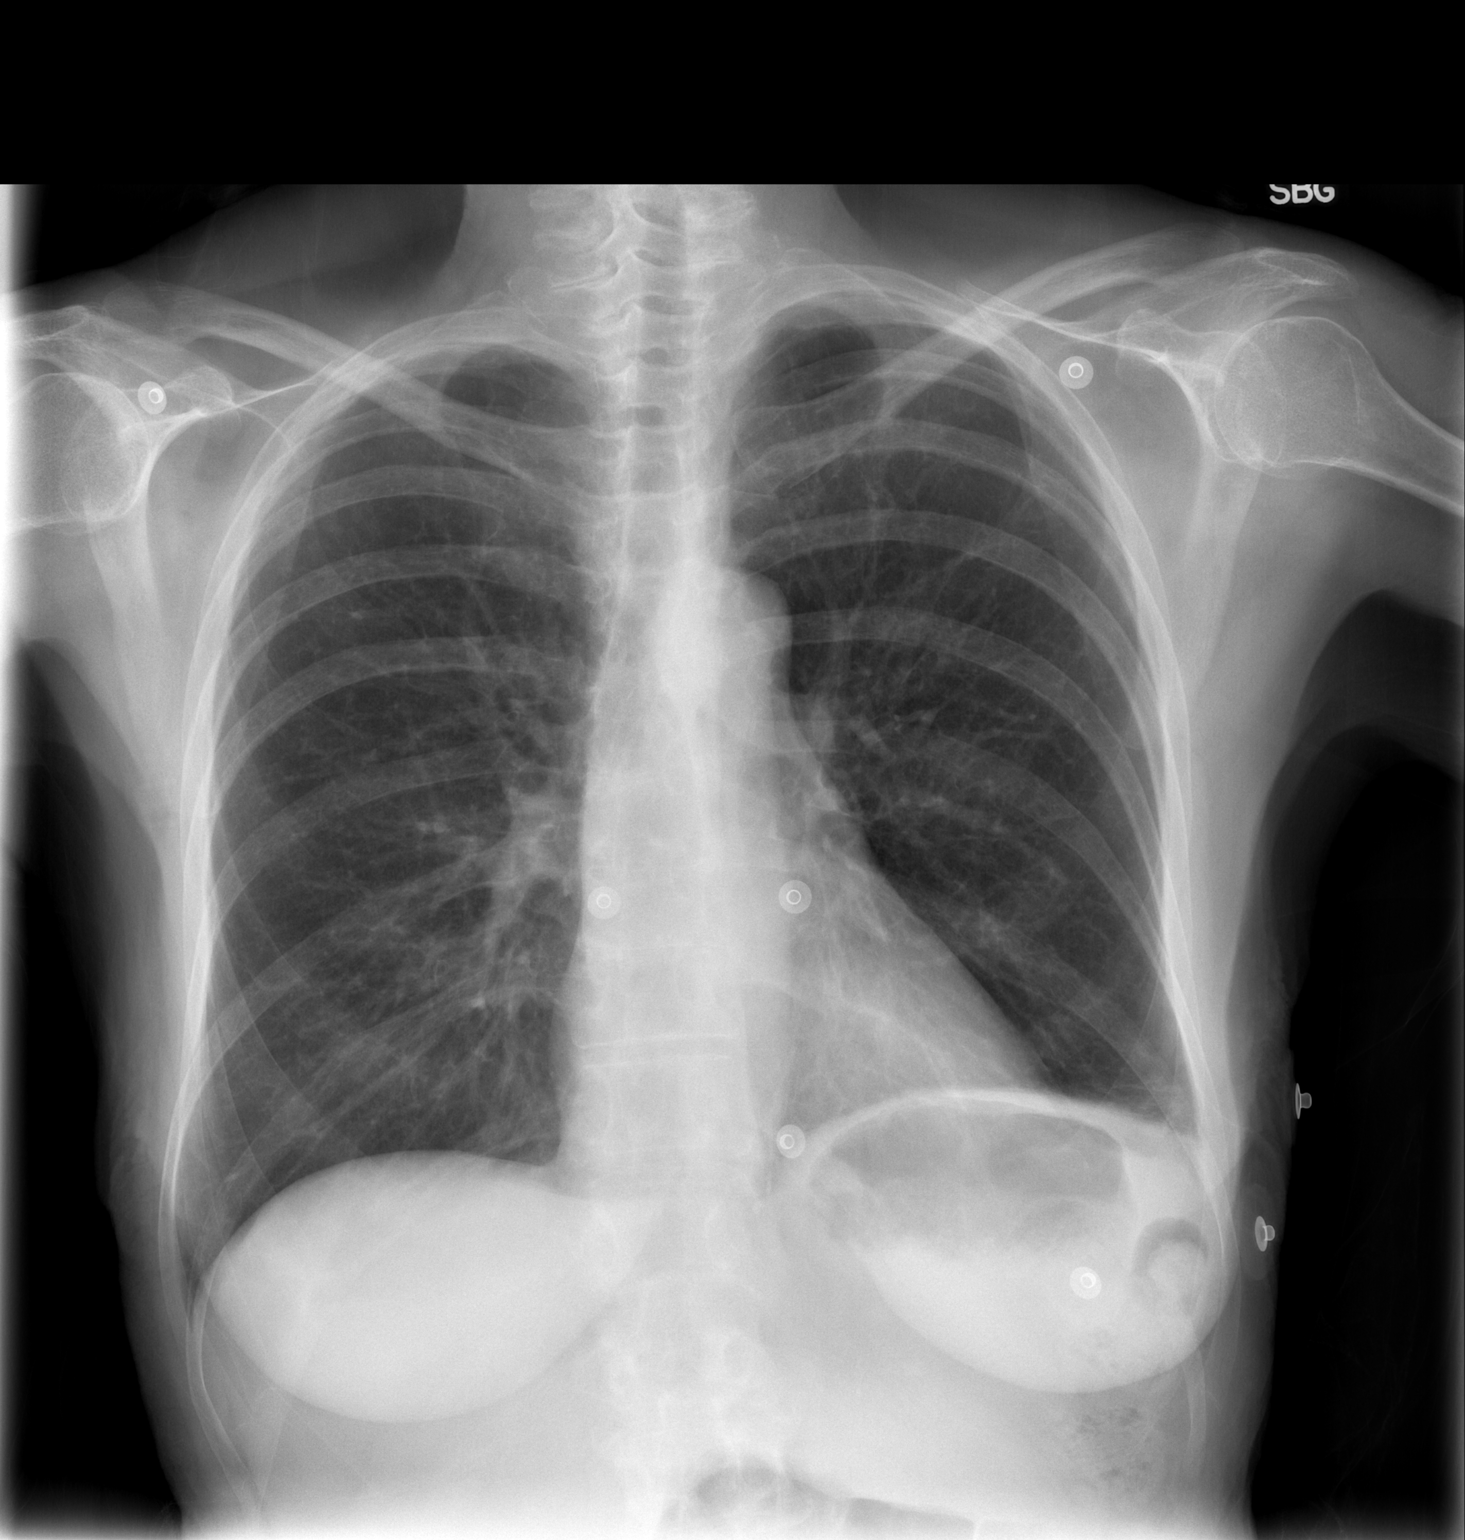

[w chest lat]
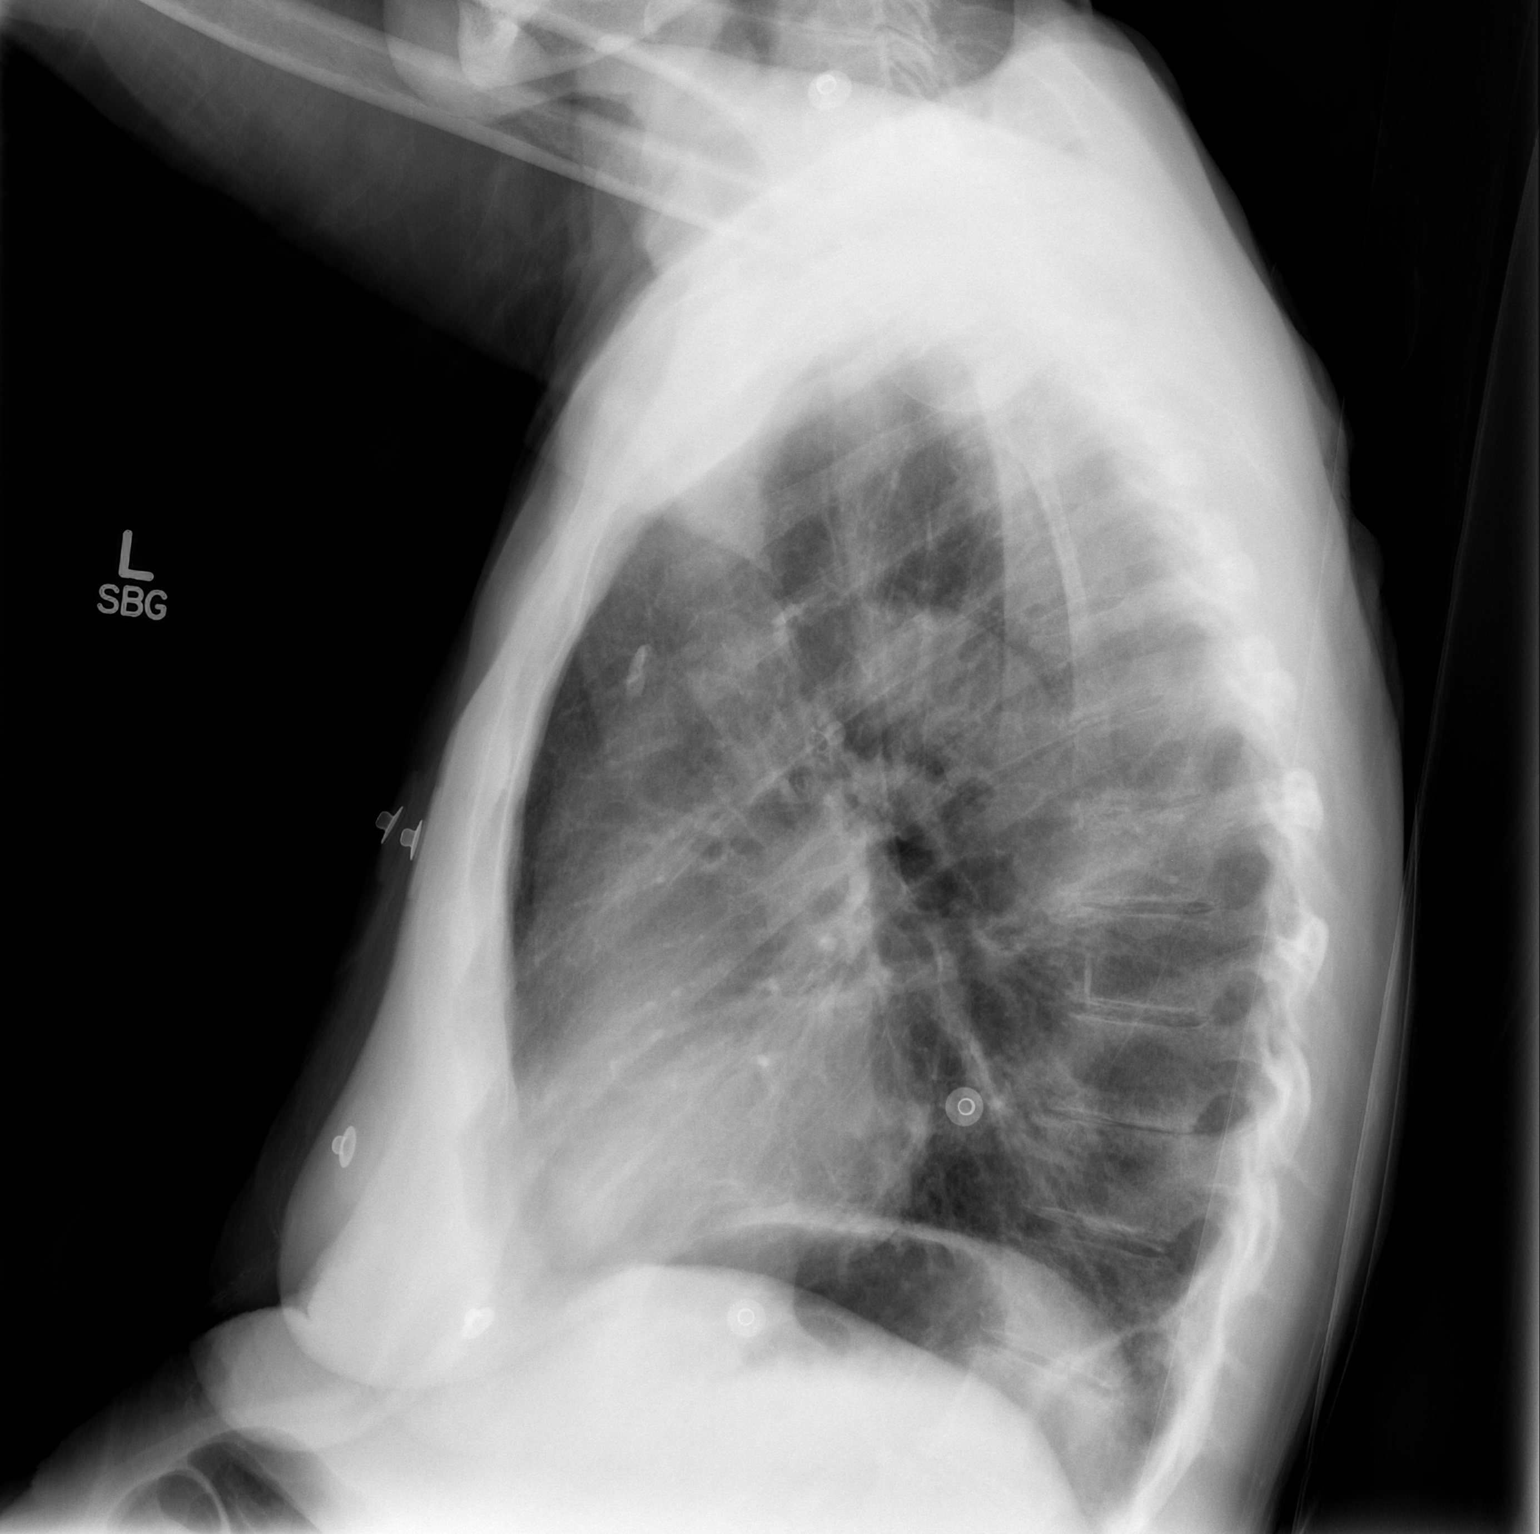

[2 of 2 positions shown; findings below may reference images not displayed]

FINDINGS: The heart size and mediastinal contours are normal. Vascular pattern
is normal. Lungs are clear except for mild scarring or atelectasis
in the left lung base. No pleural effusion.
IMPRESSION: No acute abnormalities.

## 2014-07-01 ENCOUNTER — Encounter: Payer: Self-pay | Admitting: Gastroenterology

## 2014-07-04 IMAGING — RF DG UGI W/ GASTROGRAFIN
14 of 16 series · 14 of 16 positions shown · IV contrast (omnipaque)
Comparison: CT 10/24/2013

FLUOROSCOPY TIME:  3 min 26 seconds

CLINICAL DATA: Recent cholecystectomy with cholecolonic fistula
repair and duodenum repair. Rule out leak.

EXAM:
WATER SOLUBLE UPPER GI SERIES
TECHNIQUE: Single-column upper GI series was performed using water soluble
contrast.
CONTRAST:  140mL OMNIPAQUE IOHEXOL 300 MG/ML  SOLN

[Series 1: run · 1 of 1 slices shown (1 of 14)]
[im 1/1]
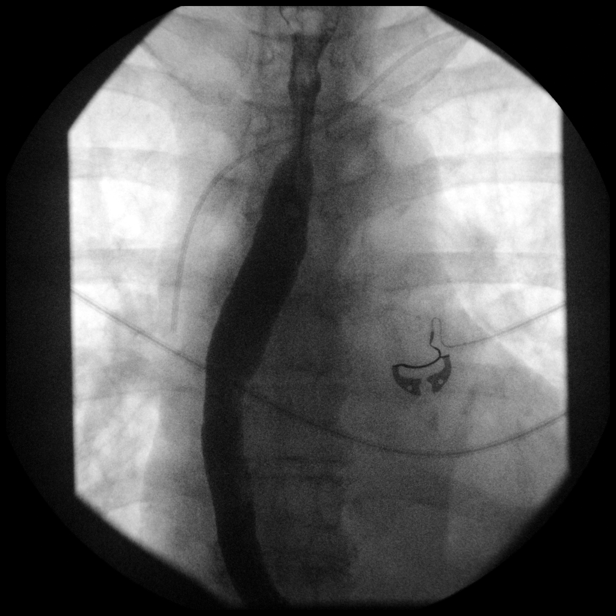

[Series 2: run · 1 of 1 slices shown (2 of 14)]
[im 1/1]
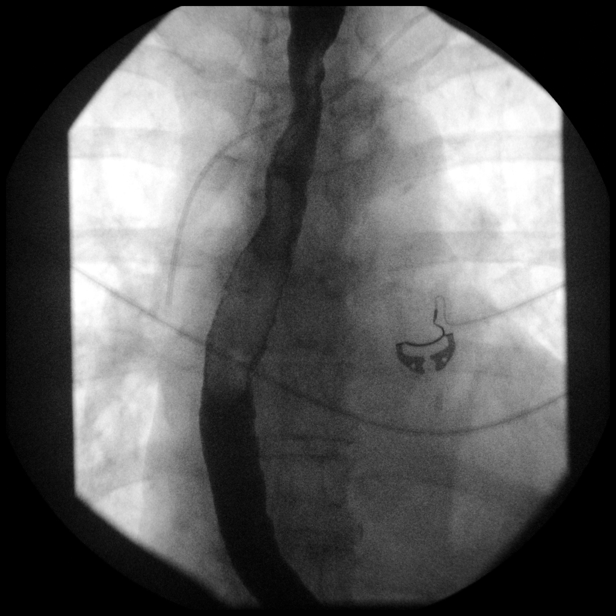

[Series 3: run · 1 of 1 slices shown (3 of 14)]
[im 1/1]
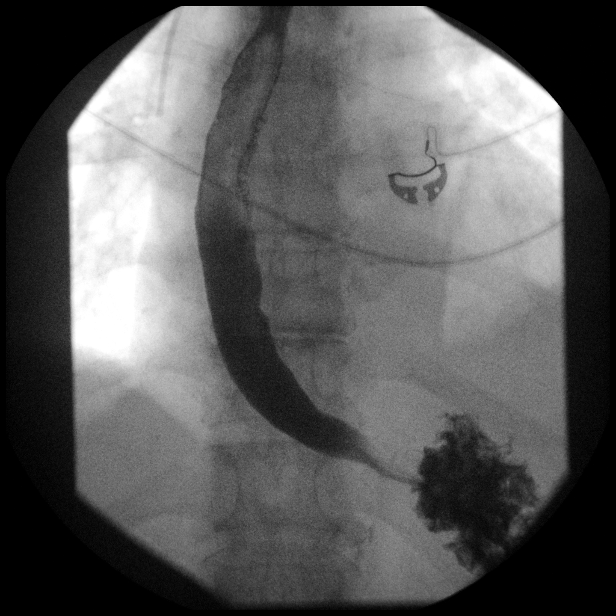

[Series 5: run · 1 of 1 slices shown (4 of 14)]
[im 1/1]
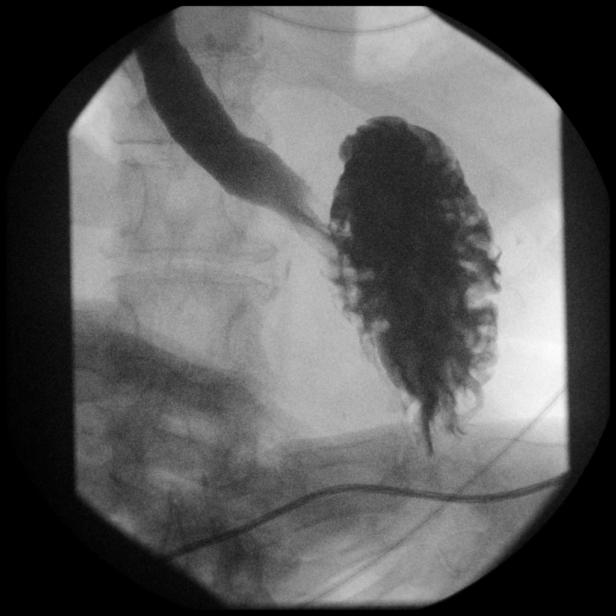

[Series 6: run · 1 of 1 slices shown (5 of 14)]
[im 1/1]
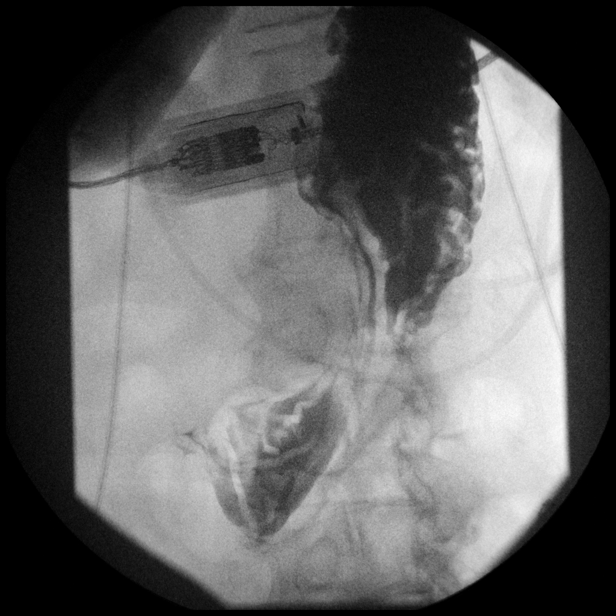

[Series 7: run · 1 of 1 slices shown (6 of 14)]
[im 1/1]
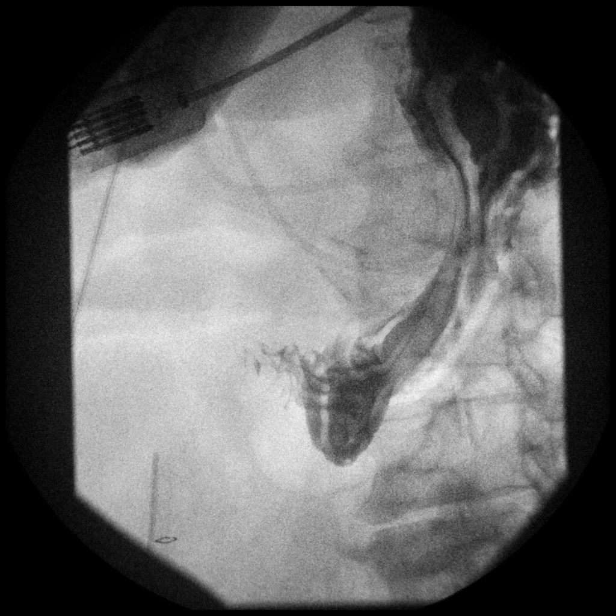

[Series 8: run · 1 of 1 slices shown (7 of 14)]
[im 1/1]
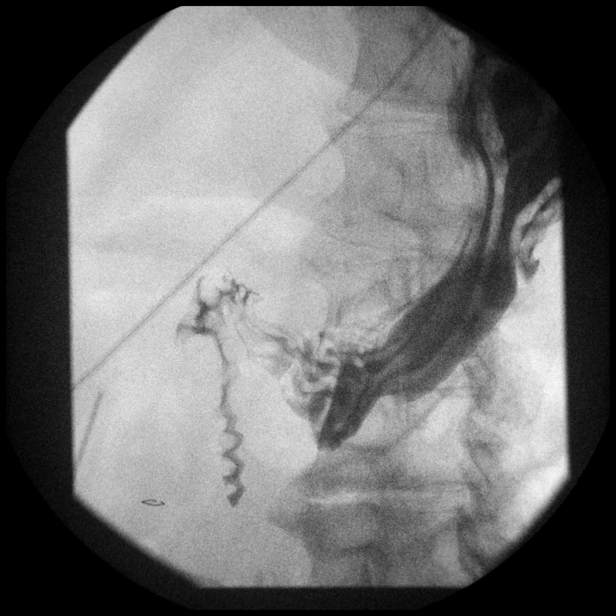

[Series 9: run · 1 of 1 slices shown (8 of 14)]
[im 1/1]
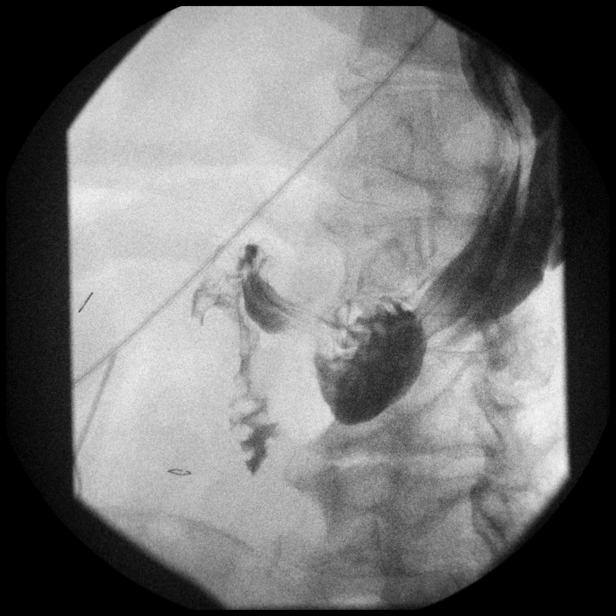

[Series 10: run · 1 of 1 slices shown (9 of 14)]
[im 1/1]
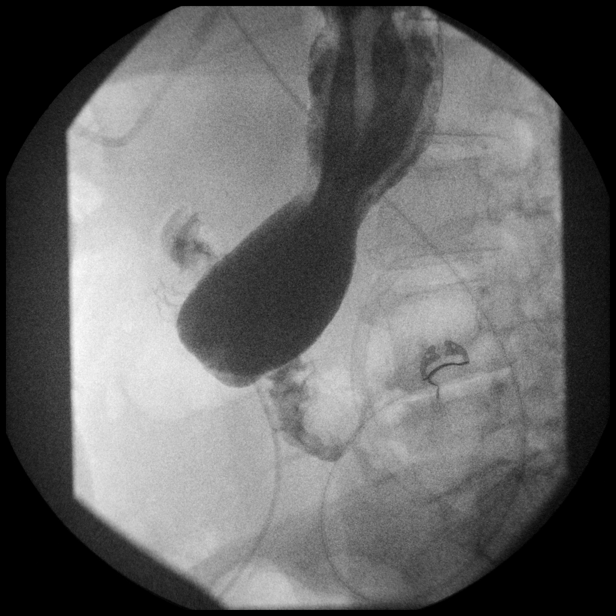

[Series 11: run · 1 of 1 slices shown (10 of 14)]
[im 1/1]
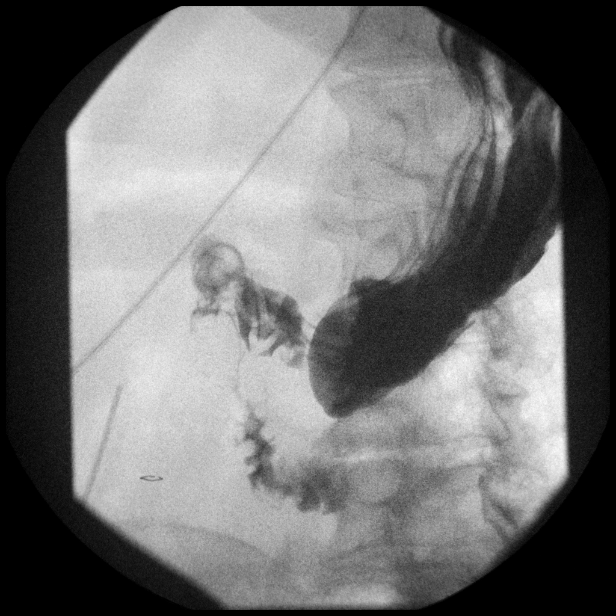

[Series 13: run · 1 of 1 slices shown (11 of 14)]
[im 1/1]
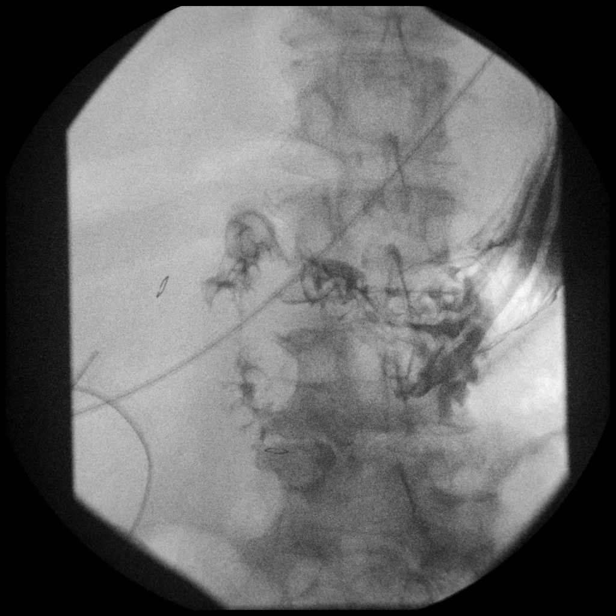

[Series 14: run · 1 of 1 slices shown (12 of 14)]
[im 1/1]
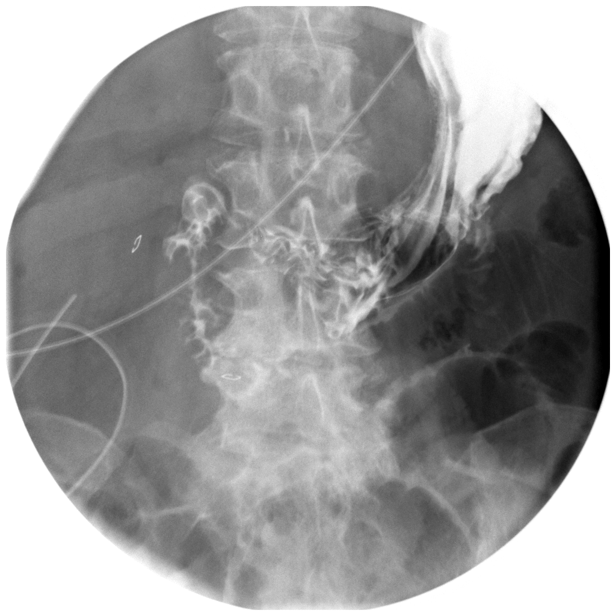

[Series 15: run · 1 of 1 slices shown (13 of 14)]
[im 1/1]
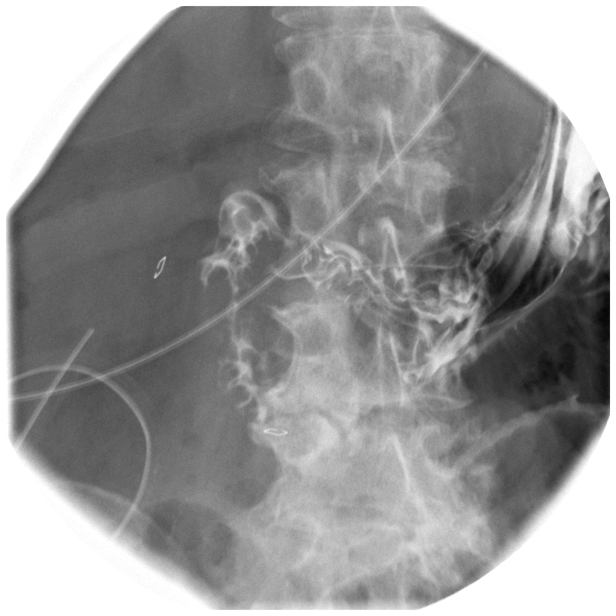

[Series 16: run · 1 of 1 slices shown (14 of 14)]
[im 1/1]
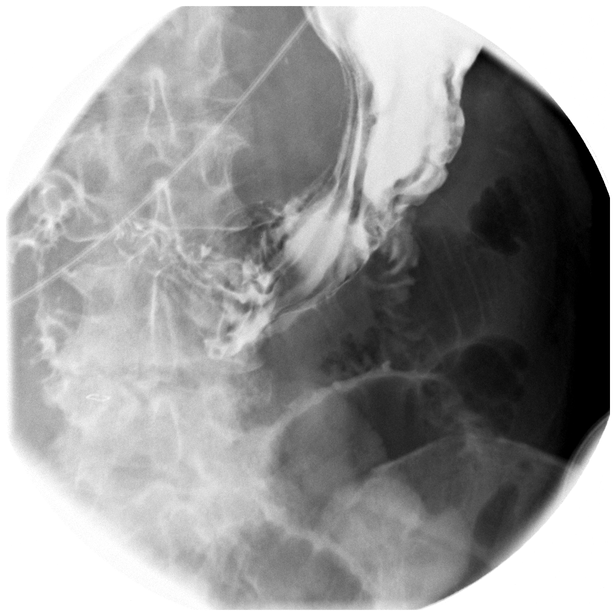

[14 of 16 positions shown; findings below may reference images not displayed]

FINDINGS: Surgical drain in the right abdomen. Mild small bowel dilatation may
be due to small-bowel obstruction or ileus.

Decrease esophageal motility. No esophageal stricture. No hiatal
hernia or reflux. The stomach filled with contrast. With the patient
in the right lateral decubitus position, stomach emptied into the
duodenum.

There is edema in the 2nd portion duodenum compatible the recent
surgery. No leak is identified. No obstruction of the duodenum.
Contrast is seen entering the proximal jejunum which is not dilated.
IMPRESSION: Small bowel dilatation may represent small bowel obstruction or
ileus.

Duodenal edema. No evidence of leak identified. No duodenal
obstruction.

## 2014-07-11 IMAGING — CR DG ABDOMEN 2V
2 series · 2 of 2 positions shown · non-contrast
Comparison: [DATE] and earlier.

CLINICAL DATA: 78-year-old female with abdominal pain and
distension. Initial encounter. Recent cholecystectomy.

EXAM:
ABDOMEN - 2 VIEW

[w abdomen decub]
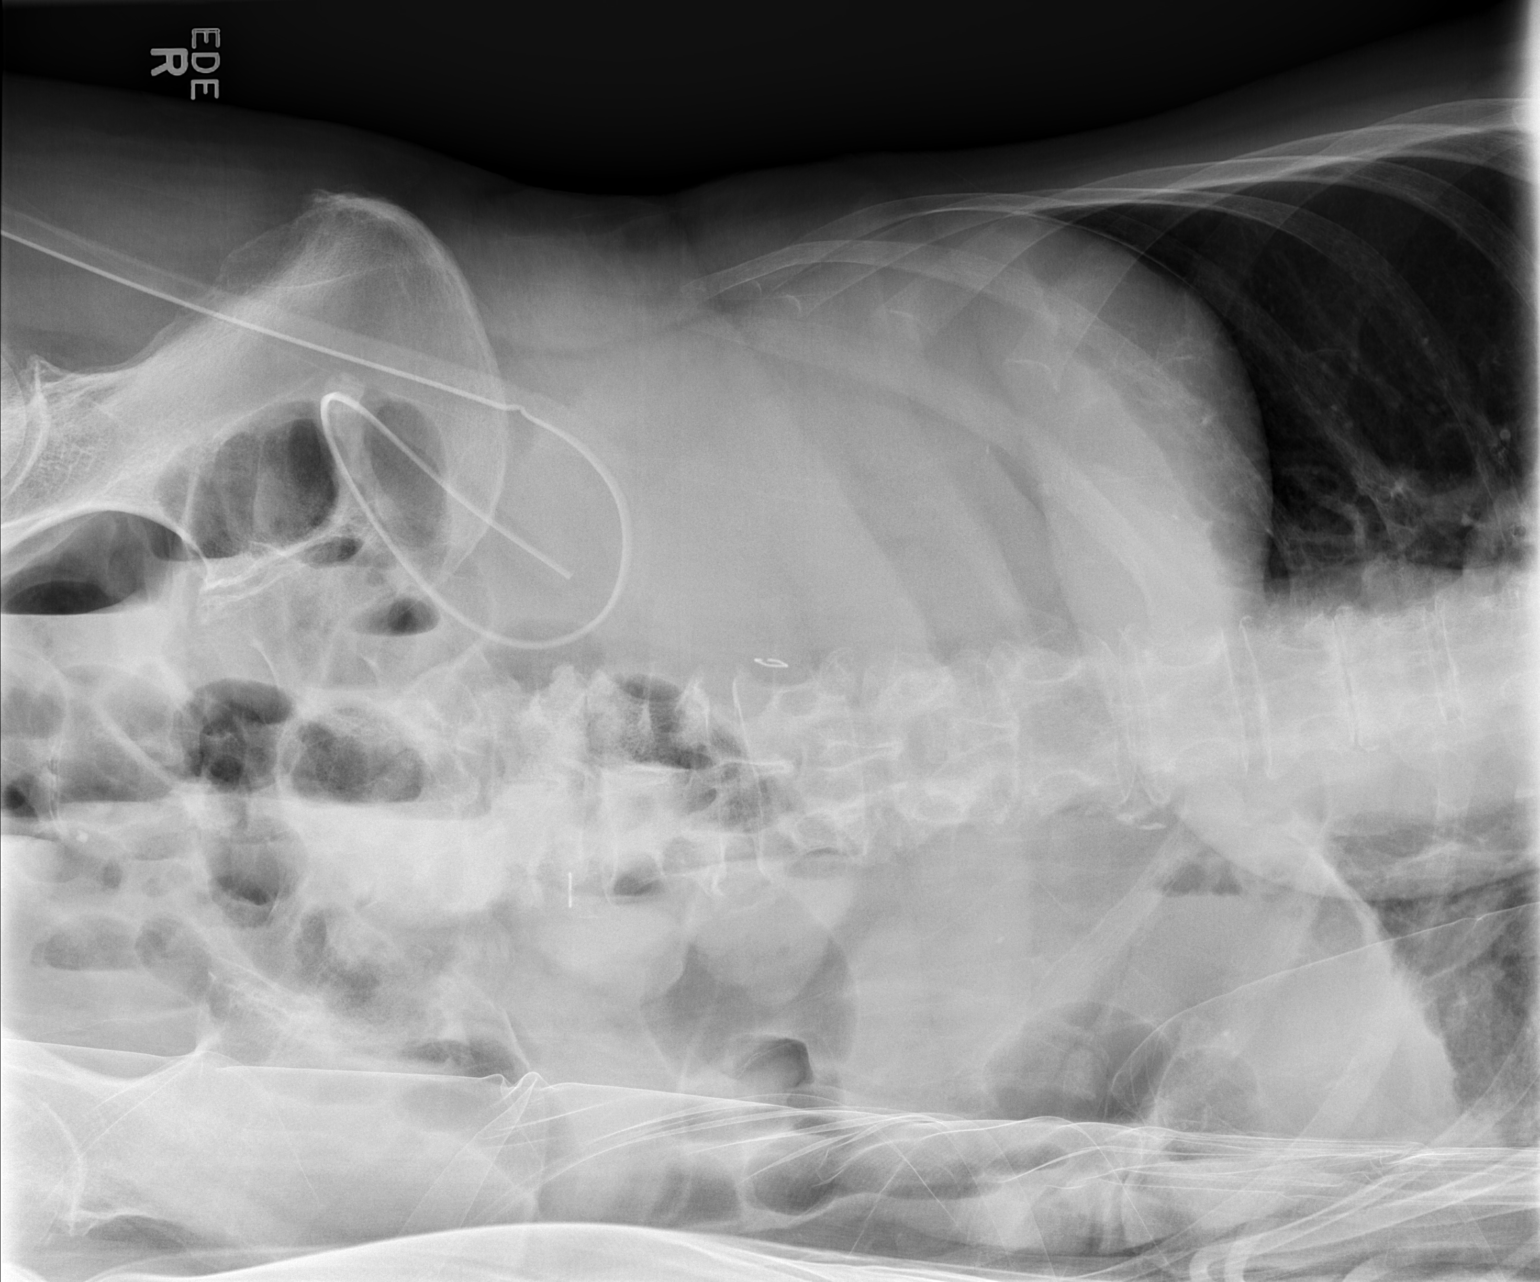

[t abdomen supine]
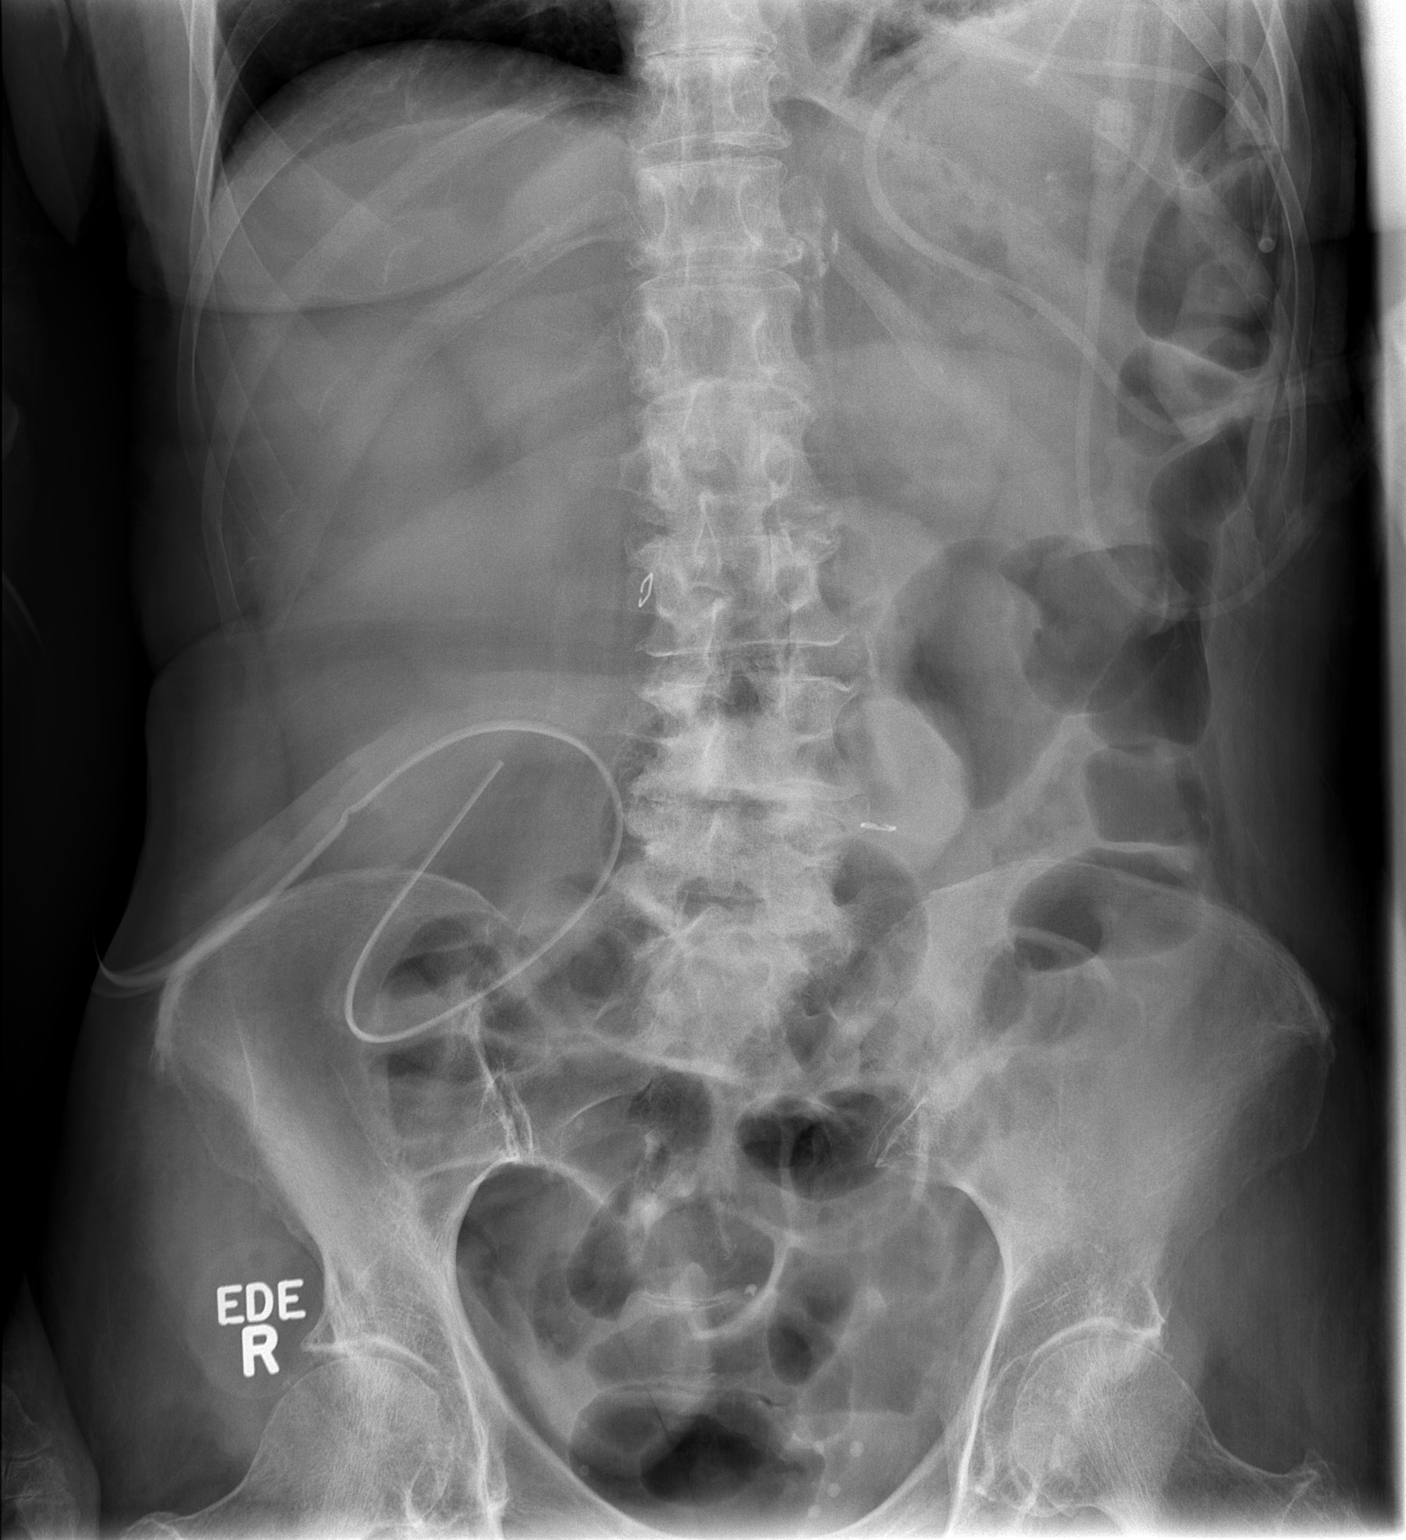

[2 of 2 positions shown; findings below may reference images not displayed]

FINDINGS: Continued small bilateral pleural effusions. Increased elevation of
the left hemidiaphragm. Stable right abdominal drainage catheter.
Stable skin staples. Mildly improved bowel gas pattern, only mild
gaseous distension of some colon segments now. Overall the pattern
is non obstructed. Stable visualized osseous structures.
Left-side-down lateral decubitus view. No pneumoperitoneum.
IMPRESSION: 1. Improved, non obstructed bowel gas pattern.  No pneumoperitoneum.

2. Small bilateral pleural effusions, query worsening ventilation at
the left lung base.

## 2014-07-20 ENCOUNTER — Ambulatory Visit (INDEPENDENT_AMBULATORY_CARE_PROVIDER_SITE_OTHER): Payer: Medicare Other | Admitting: Internal Medicine

## 2014-07-20 ENCOUNTER — Encounter: Payer: Self-pay | Admitting: Internal Medicine

## 2014-07-20 VITALS — BP 100/60 | HR 66 | Ht 63.0 in | Wt 96.6 lb

## 2014-07-20 DIAGNOSIS — Z1211 Encounter for screening for malignant neoplasm of colon: Secondary | ICD-10-CM

## 2014-07-20 DIAGNOSIS — R630 Anorexia: Secondary | ICD-10-CM

## 2014-07-20 NOTE — Progress Notes (Signed)
[facility]  MERLIN EGE    [id number]    1935-07-19    Assessment and Plan/Recommendations:  Screen for Colon Cancer:  No hx of colonoscopy.  Will schedule Anorexia: Likely  Due to known depression. May be complicated by extensive abdominal surgery.  Will place pt back on Mirtazapine if she has been taken off of it. The husband is to let us know if she is still on it or not. He is not sure but it is on her list.  HPI: [name] is a 79 y.o. female with history of diverticular disease, cholecystocolonic fistulae, and colon resection presenting today for colonoscopy.  She has had decreased appetite, decreased energy, and occasional diarrhea since a very complicated abdominal surgery fall of 2014 for cholecystitis in which significant diverticular disease and multiple fistulae were discovered requiring resection. She has had 5 lbs of weight loss in the past 6 months secondary to this. She has never had a colonoscopy or endoscopy. She currently has 2 BMs a day that are somewhat soft, with only occasional liquid stools.  She denies incontinence, but states even though she feels the urge to defecate at times, she may not always make it to the bathroom. She denies CP, SOB, N/V, or peripheral edema.   Outpatient Encounter Prescriptions as of 07/20/2014  Medication Sig  . busPIRone (BUSPAR) 10 MG tablet Take 10 mg by mouth 2 (two) times daily.  . cholecalciferol (VITAMIN D) 1000 UNITS tablet Take 1,000 Units by mouth daily.  Marland Kitchen ezetimibe-simvastatin (VYTORIN) 10-80 MG per tablet Take 1 tablet by mouth at bedtime.  . feeding supplement, ENSURE COMPLETE, (ENSURE COMPLETE) LIQD Take 237 mLs by mouth 4 (four) times daily -  with meals and at bedtime.  Marland Kitchen levothyroxine (SYNTHROID, LEVOTHROID) 75 MCG tablet Take 75 mcg by mouth daily before breakfast.  . loratadine (CLARITIN) 10 MG tablet Take 10 mg by mouth daily.  Marland Kitchen losartan (COZAAR) 50 MG tablet Take 50 mg by mouth daily.   .  mirtazapine (REMERON) 15 MG tablet Take 15 mg by mouth at bedtime.  . Multiple Vitamin (MULTIVITAMIN WITH MINERALS) TABS tablet Take 1 tablet by mouth daily.  . potassium chloride (KLOR-CON) 20 MEQ packet Take 20 mEq by mouth daily.  . potassium chloride SA (K-DUR,KLOR-CON) 20 MEQ tablet   . promethazine (PHENERGAN) 25 MG tablet Take 25 mg by mouth every 6 (six) hours as needed for nausea or vomiting.  . zaleplon (SONATA) 10 MG capsule Take 1 capsule (10 mg total) by mouth at bedtime.    Allergies as of 07/20/2014 - Review Complete 07/20/2014  Allergen Reaction Noted  . Ambien [zolpidem tartrate] Other (See Comments) 11/29/2013  . Cymbalta [duloxetine hcl] Other (See Comments) 10/01/2013  . Halcion [triazolam] Other (See Comments) 10/01/2013    Past Medical History  Diagnosis Date  . Hypothyroidism     goiter  . Hyperlipemia   . Hypertension     "was taking RX; took me off it 09/2013" (10/25/2013)  . Seasonal allergies   . History of diverticular abscess 09/2013  . Depression     "sold beach house 01/2012" (10/25/2013)  . Calculus of gallbladder with acute cholecystitis, without mention of obstruction 10/01/2013    Open chole on 10/26/13. 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 Insomnia   . Hepatic cyst 10/01/2013    CT  . H/O sebaceous cyst   . Protein calorie malnutrition     malabsorption  . Osteopenia   .  Vitamin B12 deficiency   . COPD (chronic obstructive pulmonary disease)   . Anemia   . Bowel obstruction     Past Surgical History  Procedure Laterality Date  . Cataract extraction w/ intraocular lens implant Left ~ 2010  . Abcess drainage  2014    "had drain put in & stayed for ~ 1 wk to 10 day; just came out ~ 10 days ago" (10/25/2013)  . Cholecystectomy N/A 10/26/2013    Procedure: ATTEMPTED  LAPAROSCOPIC CONVERTED TO OPEN CHOLECYSTECTOMY WITH INTRAOPERATIVE CHOLANGIOGRAM; REPAIR OF CHOLECOLONIC  FISTULA; cholecystoduodenal fistula repair; lysis of adhesions; drainage of pelvic abscess; omental patching of duodenal repair;  Surgeon: Joyice Faster. Cornett, MD;  Location: [place];  Service: General;  Laterality: N/A;  . Colon resection    . Dilation and curettage of uterus      Family History  Problem Relation Age of Onset  . Prostate cancer Father    Social history shows that she is retired. Her husband and her daughter and son-in-law have been patient's of mine. Review of systems: Positive for: Diarrhea, Anorexia All other ROS negative or as per HPI  Physical Exam: BP 100/60  Pulse 66  Ht 5\' 3"  (1.6 m)  Wt 96 lb 9.6 oz (43.817 kg)  BMI 17.12 kg/m2 Constitutional: Thin female in NAD Eyes: anicteric Mouth: oral and posterior pharynx free of lesions Neck: supple, no mass or thyromegaly Lungs: clear to auscultation bilaterally Cardiovascular: S1S2 with regular rate and rhythm, no rubs murmurs or gallops Abdomen: soft, nontender, nondistended, no masses or organomegaly, normal bowel sounds. Large incisional scar to RUQ Extremities: no lower extremity edema  Skin: no rash Neuro: alert and oriented x 3 Psych: normal mood and affect  Data Reviewed: Previous pt records and surgical reports  Legrand Como A. [place], [place] 07/20/2014 11:13 AM   I have personally seen the patient, reviewed and repeated key elements of the history and physical and participated in formation of the assessment and plan the student has documented.   Gatha Mayer, MD, [facility]  CC: Sheela Stack, MD  .

## 2014-07-20 NOTE — Patient Instructions (Signed)
You have been scheduled for a colonoscopy. Please follow written instructions given to you at your visit today.  Please pick up your prep supplies at the pharmacy. If you use inhalers (even only as needed), please bring them with you on the day of your procedure. Your physician has requested that you go to www.startemmi.com and enter the access code given to you at your visit today. This web site gives a general overview about your procedure. However, you should still follow specific instructions given to you by our office regarding your preparation for the procedure.   Please check to see if your taking remeron and call and let us know.    I appreciate the opportunity to care for you.

## 2014-09-06 ENCOUNTER — Encounter: Payer: Self-pay | Admitting: Internal Medicine

## 2014-09-06 ENCOUNTER — Ambulatory Visit (AMBULATORY_SURGERY_CENTER): Payer: Medicare Other | Admitting: Internal Medicine

## 2014-09-06 VITALS — BP 145/65 | HR 55 | Temp 96.9°F | Resp 25 | Ht 63.0 in | Wt 96.0 lb

## 2014-09-06 DIAGNOSIS — D126 Benign neoplasm of colon, unspecified: Secondary | ICD-10-CM

## 2014-09-06 DIAGNOSIS — Z1211 Encounter for screening for malignant neoplasm of colon: Secondary | ICD-10-CM

## 2014-09-06 DIAGNOSIS — D125 Benign neoplasm of sigmoid colon: Secondary | ICD-10-CM

## 2014-09-06 MED ORDER — SODIUM CHLORIDE 0.9 % IV SOLN: 500.0000 mL | INTRAVENOUS | Status: DC

## 2014-09-06 NOTE — Progress Notes (Signed)
Called to room to assist during endoscopic procedure.  Patient ID and intended procedure confirmed with present staff. Received instructions for my participation in the procedure from the performing physician.  

## 2014-09-06 NOTE — Progress Notes (Signed)
No problems noted in the recovery room. maw 

## 2014-09-06 NOTE — Op Note (Signed)
[facility]  [address]. [place], [id number]   COLONOSCOPY PROCEDURE REPORT  PATIENT: [name], [name]  MR#: [id number] BIRTHDATE: 21-Mar-1935 , 78  yrs. old GENDER: female ENDOSCOPIST: Gatha Mayer, MD, [facility] PROCEDURE DATE:  09/06/2014 PROCEDURE:   Colonoscopy with snare polypectomy First Screening Colonoscopy - Avg.  risk and is 50 yrs.  old or older Yes.  Prior Negative Screening - Now for repeat screening. N/A  History of Adenoma - Now for follow-up colonoscopy & has been > or = to 3 yrs.  N/A  Polyps Removed Today? No.  Recommend repeat exam, <10 yrs? Polyps Removed Today? No.  Recommend repeat exam, <10 yrs? No. ASA CLASS:   Class III INDICATIONS:average risk for colorectal cancer and first colonoscopy. MEDICATIONS: Propofol 200 mg and Monitored anesthesia care  DESCRIPTION OF PROCEDURE:   After the risks benefits and alternatives of the procedure were thoroughly explained, informed consent was obtained.  revealed no abnormalities of the rectum. The LB [id number]  endoscope was introduced through the anus and advanced to the cecum, which was identified by both the appendix and ileocecal valve. No adverse events experienced.   The quality of the prep was excellent, using MiraLax  The instrument was then slowly withdrawn as the colon was fully examined.  COLON FINDINGS: A sessile polyp measuring 8 mm in size was found in the sigmoid colon.  A polypectomy was performed using snare cautery.  The resection was complete, the polyp tissue was completely retrieved and sent to histology.   There was severe diverticulosis noted in the sigmoid colon.   The colon mucosa was otherwise normal.  Retroflexed views revealed no abnormalities. The time to cecum=3 minutes 54 seconds.  Withdrawal time=8 minutes 56 seconds.  The scope was withdrawn and the procedure completed. COMPLICATIONS: There were no complications.  ENDOSCOPIC IMPRESSION: 1.   Sessile polyp  was found in the sigmoid colon; polypectomy was performed using snare cautery 2.   Severe diverticulosis was noted in the sigmoid colon 3.   The colon mucosa was otherwise normal - excellent prep RECOMMENDATIONS: 1.  Hold aspirin, aspirin products, and anti-inflammatory medication for 2 weeks. 2.  Await pathology results 3.  Unlikely to need another routine colonoscopy.  eSigned:  Gatha Mayer, MD, [facility] 09/06/2014 3:33 PM   cc: Reynold Bowen. MD and The Patient

## 2014-09-06 NOTE — Progress Notes (Signed)
Report to PACU, RN, vss, BBS= Clear.  

## 2014-09-06 NOTE — Patient Instructions (Addendum)
I found and removed one polyp. It looks benign. I doubt you will need another routine colonoscopy. You also have a condition called diverticulosis - common and not usually a problem. Please read the handout provided.  I appreciate the opportunity to care for you. Gatha Mayer, MD, FACG    YOU HAD AN ENDOSCOPIC PROCEDURE TODAY AT [facility]: Refer to the procedure report that was given to you for any specific questions about what was found during the examination.  If the procedure report does not answer your questions, please call your gastroenterologist to clarify.  If you requested that your care partner not be given the details of your procedure findings, then the procedure report has been included in a sealed envelope for you to review at your convenience later.  YOU SHOULD EXPECT: Some feelings of bloating in the abdomen. Passage of more gas than usual.  Walking can help get rid of the air that was put into your GI tract during the procedure and reduce the bloating. If you had a lower endoscopy (such as a colonoscopy or flexible sigmoidoscopy) you may notice spotting of blood in your stool or on the toilet paper. If you underwent a bowel prep for your procedure, then you may not have a normal bowel movement for a few days.  DIET: Your first meal following the procedure should be a light meal and then it is ok to progress to your normal diet.  A half-sandwich or bowl of soup is an example of a good first meal.  Heavy or fried foods are harder to digest and may make you feel nauseous or bloated.  Likewise meals heavy in dairy and vegetables can cause extra gas to form and this can also increase the bloating.  Drink plenty of fluids but you should avoid alcoholic beverages for 24 hours.  ACTIVITY: Your care partner should take you home directly after the procedure.  You should plan to take it easy, moving slowly for the rest of the day.  You can resume normal activity the day  after the procedure however you should NOT DRIVE or use heavy machinery for 24 hours (because of the sedation medicines used during the test).    SYMPTOMS TO REPORT IMMEDIATELY: A gastroenterologist can be reached at any hour.  During normal business hours, 8:30 AM to 5:00 PM Monday through Friday, call [phone].  After hours and on weekends, please call the GI answering service at [phone] who will take a message and have the physician on call contact you.   Following lower endoscopy (colonoscopy or flexible sigmoidoscopy):  Excessive amounts of blood in the stool  Significant tenderness or worsening of abdominal pains  Swelling of the abdomen that is new, acute  Fever of 100F or higher FOLLOW UP: If any biopsies were taken you will be contacted by phone or by letter within the next 1-3 weeks.  Call your gastroenterologist if you have not heard about the biopsies in 3 weeks.  Our staff will call the home number listed on your records the next business day following your procedure to check on you and address any questions or concerns that you may have at that time regarding the information given to you following your procedure. This is a courtesy call and so if there is no answer at the home number and we have not heard from you through the emergency physician on call, we will assume that you have returned to your regular daily activities  without incident.  SIGNATURES/CONFIDENTIALITY: You and/or your care partner have signed paperwork which will be entered into your electronic medical record.  These signatures attest to the fact that that the information above on your After Visit Summary has been reviewed and is understood.  Full responsibility of the confidentiality of this discharge information lies with you and/or your care-partner.     Handouts were given to your care partner on polyps and diverticulosis.  Hold aspirin, aspirin products, and any anti-inflammatory medications for  2 weeks. You may resume your other current medications today. Await biopsy results. Please call if any questions or concerns.

## 2014-09-07 ENCOUNTER — Telehealth: Payer: Self-pay | Admitting: *Deleted

## 2014-09-07 NOTE — Telephone Encounter (Signed)
  Follow up Call-  Call back number 09/06/2014  Post procedure Call Back phone  # [phone]  Permission to leave phone message Yes     Patient questions: spoke with pt's husband; she was asleep  Do you have a fever, pain , or abdominal swelling? No. Pain Score  0 *  Have you tolerated food without any problems? Yes.    Have you been able to return to your normal activities? Yes.    Do you have any questions about your discharge instructions: Diet   No. Medications  No. Follow up visit  No.  Do you have questions or concerns about your Care? No.  Actions: * If pain score is 4 or above: No action needed, pain <4.

## 2014-09-13 ENCOUNTER — Encounter: Payer: Self-pay | Admitting: Internal Medicine

## 2014-09-13 NOTE — Progress Notes (Signed)
Quick Note:  Hyperplastic polyp No colon recall ______

## 2016-04-09 DIAGNOSIS — R7301 Impaired fasting glucose: Secondary | ICD-10-CM | POA: Diagnosis not present

## 2016-04-09 DIAGNOSIS — E039 Hypothyroidism, unspecified: Secondary | ICD-10-CM | POA: Diagnosis not present

## 2016-04-09 DIAGNOSIS — J449 Chronic obstructive pulmonary disease, unspecified: Secondary | ICD-10-CM | POA: Diagnosis not present

## 2016-04-09 DIAGNOSIS — D649 Anemia, unspecified: Secondary | ICD-10-CM | POA: Diagnosis not present

## 2016-04-09 DIAGNOSIS — R634 Abnormal weight loss: Secondary | ICD-10-CM | POA: Diagnosis not present

## 2016-04-09 DIAGNOSIS — E559 Vitamin D deficiency, unspecified: Secondary | ICD-10-CM | POA: Diagnosis not present

## 2016-04-09 DIAGNOSIS — E538 Deficiency of other specified B group vitamins: Secondary | ICD-10-CM | POA: Diagnosis not present

## 2016-04-09 DIAGNOSIS — F320 Major depressive disorder, single episode, mild: Secondary | ICD-10-CM | POA: Diagnosis not present

## 2016-04-09 DIAGNOSIS — E785 Hyperlipidemia, unspecified: Secondary | ICD-10-CM | POA: Diagnosis not present

## 2016-04-09 DIAGNOSIS — Z6820 Body mass index (BMI) 20.0-20.9, adult: Secondary | ICD-10-CM | POA: Diagnosis not present

## 2016-05-17 DIAGNOSIS — F17210 Nicotine dependence, cigarettes, uncomplicated: Secondary | ICD-10-CM | POA: Diagnosis not present

## 2016-05-17 DIAGNOSIS — E538 Deficiency of other specified B group vitamins: Secondary | ICD-10-CM | POA: Diagnosis not present

## 2016-05-17 DIAGNOSIS — I10 Essential (primary) hypertension: Secondary | ICD-10-CM | POA: Diagnosis not present

## 2016-05-17 DIAGNOSIS — K635 Polyp of colon: Secondary | ICD-10-CM | POA: Diagnosis not present

## 2016-05-17 DIAGNOSIS — R7301 Impaired fasting glucose: Secondary | ICD-10-CM | POA: Diagnosis not present

## 2016-05-17 DIAGNOSIS — F5104 Psychophysiologic insomnia: Secondary | ICD-10-CM | POA: Diagnosis not present

## 2016-05-17 DIAGNOSIS — E784 Other hyperlipidemia: Secondary | ICD-10-CM | POA: Diagnosis not present

## 2016-05-17 DIAGNOSIS — E038 Other specified hypothyroidism: Secondary | ICD-10-CM | POA: Diagnosis not present

## 2016-05-17 DIAGNOSIS — R634 Abnormal weight loss: Secondary | ICD-10-CM | POA: Diagnosis not present

## 2016-05-17 DIAGNOSIS — J449 Chronic obstructive pulmonary disease, unspecified: Secondary | ICD-10-CM | POA: Diagnosis not present

## 2016-08-23 DIAGNOSIS — Z23 Encounter for immunization: Secondary | ICD-10-CM | POA: Diagnosis not present

## 2016-08-23 DIAGNOSIS — F17210 Nicotine dependence, cigarettes, uncomplicated: Secondary | ICD-10-CM | POA: Diagnosis not present

## 2016-08-23 DIAGNOSIS — F320 Major depressive disorder, single episode, mild: Secondary | ICD-10-CM | POA: Diagnosis not present

## 2016-08-23 DIAGNOSIS — R7301 Impaired fasting glucose: Secondary | ICD-10-CM | POA: Diagnosis not present

## 2016-08-23 DIAGNOSIS — E784 Other hyperlipidemia: Secondary | ICD-10-CM | POA: Diagnosis not present

## 2016-08-23 DIAGNOSIS — F5104 Psychophysiologic insomnia: Secondary | ICD-10-CM | POA: Diagnosis not present

## 2016-08-23 DIAGNOSIS — E038 Other specified hypothyroidism: Secondary | ICD-10-CM | POA: Diagnosis not present

## 2016-08-23 DIAGNOSIS — E559 Vitamin D deficiency, unspecified: Secondary | ICD-10-CM | POA: Diagnosis not present

## 2016-08-23 DIAGNOSIS — E538 Deficiency of other specified B group vitamins: Secondary | ICD-10-CM | POA: Diagnosis not present

## 2016-08-23 DIAGNOSIS — I10 Essential (primary) hypertension: Secondary | ICD-10-CM | POA: Diagnosis not present

## 2016-09-23 DIAGNOSIS — M799 Soft tissue disorder, unspecified: Secondary | ICD-10-CM | POA: Diagnosis not present

## 2016-09-23 DIAGNOSIS — Z681 Body mass index (BMI) 19 or less, adult: Secondary | ICD-10-CM | POA: Diagnosis not present

## 2016-10-04 ENCOUNTER — Ambulatory Visit: Payer: Self-pay | Admitting: Surgery

## 2016-10-04 DIAGNOSIS — L723 Sebaceous cyst: Secondary | ICD-10-CM | POA: Diagnosis not present

## 2016-10-04 NOTE — H&P (Signed)
[name]. Rensch 10/04/2016 10:46 AM Location: [facility] Patient #: [id number] DOB: [birth year] Married / Language: [name] / Race: White Female  History of Present Illness [name] A. Amara [name]; 10/04/2016 11:04 AM) Patient words: Patient sent at the request of Dr. Forde Dandy for a 3 cm mass on her left neck. She states she had a small cyst there for many years intermittent left neck. This then migrated her trachea about 3 cm it looks like it became larger and drained. She has no significant drainage now. It was erythematous with that is resolved on antibiotics. Denies sore throat, dysphagia, or difficulty swallowing. There's been no drainage from it the last few days. The drainage was white to yellow in nature and was minimal.  The patient is a 81 year old female.   Past Surgical History [name], LPN; 624THL [id number] AM) Gallbladder Surgery - Open  Allergies [name], CMA; 10/04/2016 10:47 AM) Ambien *HYPNOTICS/SEDATIVES/SLEEP DISORDER AGENTS* Cymbalta *ANTIDEPRESSANTS* Halcion *HYPNOTICS/SEDATIVES/SLEEP DISORDER AGENTS*  Medication History ([name], CMA; 10/04/2016 10:48 AM) BusPIRone HCl (10MG  Tablet, Oral) Active. Citalopram Hydrobromide (10MG  Tablet, Oral) Active. Levothyroxine Sodium (75MCG Tablet, Oral) Active. Ezetimibe (10MG  Tablet, Oral) Active. Potassium Chloride Crys ER (20MEQ Tablet ER, Oral) Active. Simvastatin (80MG  Tablet, Oral) Active. TraZODone HCl (50MG  Tablet, Oral) Active. Medications Reconciled  Social History [name], LPN; 624THL [id number] AM) Caffeine use Coffee. Tobacco use Current every day smoker.  Pregnancy / Birth History [name], LPN; 624THL [id number] AM) Age at menarche 68 years.     Review of Systems [name] LPN; 624THL [id number] AM) General Not Present- Appetite Loss, Chills, Fatigue, Fever, Night Sweats, Weight Gain and Weight Loss. Skin Present- Change in Wart/Mole. Not  Present- Dryness, Hives, Jaundice, New Lesions, Non-Healing Wounds, Rash and Ulcer. Female Genitourinary Not Present- Frequency, Nocturia, Painful Urination, Pelvic Pain and Urgency. Musculoskeletal Not Present- Back Pain, Joint Pain, Joint Stiffness, Muscle Pain, Muscle Weakness and Swelling of Extremities. Endocrine Not Present- Cold Intolerance, Excessive Hunger, Hair Changes, Heat Intolerance, Hot flashes and New Diabetes. Hematology Not Present- Blood Thinners, Easy Bruising, Excessive bleeding, Gland problems, HIV and Persistent Infections.  Vitals ([name] CMA; 10/04/2016 10:47 AM) 10/04/2016 10:46 AM Weight: 106 lb Height: 63in Body Surface Area: 1.48 m Body Mass Index: 18.78 kg/m  Temp.: 97.49F(Temporal)  Pulse: 76 (Regular)  BP: 124/80 (Sitting, Left Arm, Standard)      Physical Exam ([name] [name]; 10/04/2016 11:05 AM)  General Mental Status-Alert. General Appearance-Consistent with stated age. Hydration-Well hydrated. Voice-Normal.  Integumentary Note: Left neck is a 3 cm x 3 cm ulcerated mass in the skin. This is mobile and not fixed to any underlying structures. No signs of active infection.  Head and Neck Note: Thyroid normal. No lymphadenopathy. Trachea midline.  Chest and Lung Exam Chest and lung exam reveals -quiet, even and easy respiratory effort with no use of accessory muscles and on auscultation, normal breath sounds, no adventitious sounds and normal vocal resonance. Inspection Chest Wall - Normal. Back - normal.  Cardiovascular Cardiovascular examination reveals -normal heart sounds, regular rate and rhythm with no murmurs and normal pedal pulses bilaterally.  Neurologic Neurologic evaluation reveals -alert and oriented x 3 with no impairment of recent or remote memory. Mental Status-Normal.  Musculoskeletal Normal Exam - Left-Upper Extremity Strength Normal and Lower Extremity Strength Normal. Normal  Exam - Right-Upper Extremity Strength Normal and Lower Extremity Strength Normal.  Lymphatic Head & Neck  General Head & Neck Lymphatics: Bilateral - Description - Normal. Axillary  General Axillary Region: Bilateral - Description - Normal. Tenderness - Non Tender.    Assessment & Plan ([name] A. [name]; 10/04/2016 11:05 AM)  CYST OF NECK (L72.3) Impression: Recommend excision of left neck cyst. Risks, benefits and alternative therapies were discussed with the patient. Risk of bleeding, infection, injury to structure in neck, anesthesia complications, stroke, DVT, death, cardiovascular events and the need further procedures.  Current Plans The pathophysiology of skin & subcutaneous masses was discussed. Natural history risks without surgery were discussed. I recommended surgery to remove the mass. I explained the technique of removal with use of local anesthesia & possible need for more aggressive sedation/anesthesia for patient comfort.  Risks such as bleeding, infection, wound breakdown, heart attack, death, and other risks were discussed. I noted a good likelihood this will help address the problem. Possibility that this will not correct all symptoms was explained. Possibility of regrowth/recurrence of the mass was discussed. We will work to minimize complications. Questions were answered. The patient expresses understanding & wishes to proceed with surgery.  Pt Education - [employer] Free Text Education/Instructions: discussed with patient and provided information.

## 2016-10-24 ENCOUNTER — Encounter (HOSPITAL_BASED_OUTPATIENT_CLINIC_OR_DEPARTMENT_OTHER): Payer: Self-pay | Admitting: *Deleted

## 2016-10-28 ENCOUNTER — Other Ambulatory Visit: Payer: Self-pay

## 2016-10-28 ENCOUNTER — Encounter (HOSPITAL_BASED_OUTPATIENT_CLINIC_OR_DEPARTMENT_OTHER)
Admission: RE | Admit: 2016-10-28 | Discharge: 2016-10-28 | Disposition: A | Discharge status: Home / Self Care | Payer: Medicare Other | Source: Ambulatory Visit | Attending: Surgery | Admitting: Surgery

## 2016-10-28 DIAGNOSIS — E43 Unspecified severe protein-calorie malnutrition: Secondary | ICD-10-CM | POA: Diagnosis not present

## 2016-10-28 DIAGNOSIS — J449 Chronic obstructive pulmonary disease, unspecified: Secondary | ICD-10-CM | POA: Insufficient documentation

## 2016-10-28 DIAGNOSIS — R001 Bradycardia, unspecified: Secondary | ICD-10-CM | POA: Insufficient documentation

## 2016-10-28 DIAGNOSIS — L720 Epidermal cyst: Secondary | ICD-10-CM | POA: Diagnosis not present

## 2016-10-28 DIAGNOSIS — Z01810 Encounter for preprocedural cardiovascular examination: Secondary | ICD-10-CM | POA: Diagnosis not present

## 2016-10-28 DIAGNOSIS — E785 Hyperlipidemia, unspecified: Secondary | ICD-10-CM | POA: Diagnosis not present

## 2016-10-28 DIAGNOSIS — I10 Essential (primary) hypertension: Secondary | ICD-10-CM | POA: Diagnosis not present

## 2016-10-28 DIAGNOSIS — F17210 Nicotine dependence, cigarettes, uncomplicated: Secondary | ICD-10-CM | POA: Diagnosis not present

## 2016-10-28 DIAGNOSIS — D487 Neoplasm of uncertain behavior of other specified sites: Secondary | ICD-10-CM | POA: Diagnosis present

## 2016-10-28 LAB — BASIC METABOLIC PANEL
Anion gap: 10 (ref 5–15)
BUN: 7 mg/dL (ref 6–20)
CO2: 28 mmol/L (ref 22–32)
Calcium: 9.7 mg/dL (ref 8.9–10.3)
Chloride: 101 mmol/L (ref 101–111)
Creatinine, Ser: 0.72 mg/dL (ref 0.44–1.00)
GFR calc Af Amer: >60 mL/min (ref >60)
GFR calc non Af Amer: >60 mL/min (ref >60)
Glucose, Bld: 110 mg/dL — ABNORMAL HIGH (ref 65–99)
Potassium: 3.4 mmol/L — ABNORMAL LOW (ref 3.5–5.1)
Sodium: 139 mmol/L (ref 135–145)

## 2016-10-28 NOTE — Progress Notes (Signed)
ekg and lab reviewed by dr Oletta Lamas,  Wants repeat ekg for lead placement comparison and repeat I stat due to k+ of 3.4

## 2016-10-31 ENCOUNTER — Ambulatory Visit (HOSPITAL_BASED_OUTPATIENT_CLINIC_OR_DEPARTMENT_OTHER)
Admission: RE | Admit: 2016-10-31 | Discharge: 2016-10-31 | Disposition: A | Discharge status: Home / Self Care | Payer: Medicare Other | Source: Ambulatory Visit | Attending: Surgery | Admitting: Surgery

## 2016-10-31 ENCOUNTER — Ambulatory Visit (HOSPITAL_BASED_OUTPATIENT_CLINIC_OR_DEPARTMENT_OTHER): Payer: Medicare Other | Admitting: Anesthesiology

## 2016-10-31 ENCOUNTER — Encounter (HOSPITAL_BASED_OUTPATIENT_CLINIC_OR_DEPARTMENT_OTHER): Payer: Self-pay | Admitting: Certified Registered"

## 2016-10-31 ENCOUNTER — Encounter (HOSPITAL_BASED_OUTPATIENT_CLINIC_OR_DEPARTMENT_OTHER)
Admission: RE | Disposition: A | Discharge status: Home / Self Care | Payer: Self-pay | Source: Ambulatory Visit | Attending: Surgery

## 2016-10-31 DIAGNOSIS — L723 Sebaceous cyst: Secondary | ICD-10-CM | POA: Diagnosis not present

## 2016-10-31 DIAGNOSIS — L720 Epidermal cyst: Secondary | ICD-10-CM | POA: Insufficient documentation

## 2016-10-31 DIAGNOSIS — I10 Essential (primary) hypertension: Secondary | ICD-10-CM | POA: Insufficient documentation

## 2016-10-31 DIAGNOSIS — F17210 Nicotine dependence, cigarettes, uncomplicated: Secondary | ICD-10-CM | POA: Diagnosis not present

## 2016-10-31 DIAGNOSIS — L729 Follicular cyst of the skin and subcutaneous tissue, unspecified: Secondary | ICD-10-CM | POA: Diagnosis not present

## 2016-10-31 DIAGNOSIS — J449 Chronic obstructive pulmonary disease, unspecified: Secondary | ICD-10-CM | POA: Diagnosis not present

## 2016-10-31 HISTORY — PX: EXCISION MASS NECK: SHX6703

## 2016-10-31 SURGERY — EXCISION, MASS, NECK
Anesthesia: General | Site: Neck | Laterality: Left

## 2016-10-31 MED ORDER — PHENYLEPHRINE 40 MCG/ML (10ML) SYRINGE FOR IV PUSH (FOR BLOOD PRESSURE SUPPORT)
PREFILLED_SYRINGE | INTRAVENOUS | Status: AC
  Filled 2016-10-31: qty 10

## 2016-10-31 MED ORDER — CHLORHEXIDINE GLUCONATE CLOTH 2 % EX PADS: 6.0000 | MEDICATED_PAD | Freq: Once | CUTANEOUS | Status: DC

## 2016-10-31 MED ORDER — PROPOFOL 10 MG/ML IV BOLUS
INTRAVENOUS | Status: DC | PRN
  Administered 2016-10-31: 90 mg via INTRAVENOUS

## 2016-10-31 MED ORDER — PHENYLEPHRINE HCL 10 MG/ML IJ SOLN
INTRAMUSCULAR | Status: DC | PRN
  Administered 2016-10-31 (×3): 40 ug via INTRAVENOUS

## 2016-10-31 MED ORDER — SCOPOLAMINE 1 MG/3DAYS TD PT72: 1.0000 | MEDICATED_PATCH | Freq: Once | TRANSDERMAL | Status: DC | PRN

## 2016-10-31 MED ORDER — LIDOCAINE 2% (20 MG/ML) 5 ML SYRINGE
INTRAMUSCULAR | Status: AC
  Filled 2016-10-31: qty 5

## 2016-10-31 MED ORDER — HYDROCODONE-ACETAMINOPHEN 5-325 MG PO TABS: 1.0000 | ORAL_TABLET | Freq: Four times a day (QID) | ORAL | 0 refills | Status: DC | PRN

## 2016-10-31 MED ORDER — DEXTROSE 5 % IV SOLN: 3.0000 g | INTRAVENOUS | Status: DC

## 2016-10-31 MED ORDER — ONDANSETRON HCL 4 MG/2ML IJ SOLN
INTRAMUSCULAR | Status: AC
  Filled 2016-10-31: qty 2

## 2016-10-31 MED ORDER — CEFAZOLIN SODIUM-DEXTROSE 2-4 GM/100ML-% IV SOLN
INTRAVENOUS | Status: AC
  Filled 2016-10-31: qty 100

## 2016-10-31 MED ORDER — FENTANYL CITRATE (PF) 100 MCG/2ML IJ SOLN
INTRAMUSCULAR | Status: AC
  Filled 2016-10-31: qty 2

## 2016-10-31 MED ORDER — METOCLOPRAMIDE HCL 5 MG/ML IJ SOLN: 10.0000 mg | Freq: Once | INTRAMUSCULAR | Status: DC | PRN

## 2016-10-31 MED ORDER — PROPOFOL 500 MG/50ML IV EMUL
INTRAVENOUS | Status: AC
  Filled 2016-10-31: qty 50

## 2016-10-31 MED ORDER — FENTANYL CITRATE (PF) 100 MCG/2ML IJ SOLN
INTRAMUSCULAR | Status: DC | PRN
  Administered 2016-10-31: 25 ug via INTRAVENOUS

## 2016-10-31 MED ORDER — FENTANYL CITRATE (PF) 100 MCG/2ML IJ SOLN: 25.0000 ug | INTRAMUSCULAR | Status: DC | PRN

## 2016-10-31 MED ORDER — ONDANSETRON HCL 4 MG/2ML IJ SOLN
INTRAMUSCULAR | Status: DC | PRN
  Administered 2016-10-31: 4 mg via INTRAVENOUS

## 2016-10-31 MED ORDER — LIDOCAINE 2% (20 MG/ML) 5 ML SYRINGE
INTRAMUSCULAR | Status: DC | PRN
  Administered 2016-10-31: 100 mg via INTRAVENOUS

## 2016-10-31 MED ORDER — BUPIVACAINE HCL 0.25 % IJ SOLN
INTRAMUSCULAR | Status: DC | PRN
  Administered 2016-10-31: 10 mL

## 2016-10-31 MED ORDER — LACTATED RINGERS IV SOLN
INTRAVENOUS | Status: DC
  Administered 2016-10-31: 10 mL/h via INTRAVENOUS

## 2016-10-31 MED ORDER — CEFAZOLIN SODIUM-DEXTROSE 2-3 GM-% IV SOLR
INTRAVENOUS | Status: DC | PRN
  Administered 2016-10-31: 2 g via INTRAVENOUS

## 2016-10-31 SURGICAL SUPPLY — 30 items
BENZOIN TINCTURE PRP APPL 2/3 (GAUZE/BANDAGES/DRESSINGS) IMPLANT
BLADE SURG 15 STRL LF DISP TIS (BLADE) ×1 IMPLANT
BLADE SURG 15 STRL SS (BLADE) ×1
CANISTER SUCT 1200ML W/VALVE (MISCELLANEOUS) IMPLANT
CHLORAPREP W/TINT 26ML (MISCELLANEOUS) ×2 IMPLANT
COVER MAYO STAND STRL (DRAPES) ×2 IMPLANT
DECANTER SPIKE VIAL GLASS SM (MISCELLANEOUS) IMPLANT
DERMABOND ADVANCED (GAUZE/BANDAGES/DRESSINGS)
DERMABOND ADVANCED .7 DNX12 (GAUZE/BANDAGES/DRESSINGS) IMPLANT
DRAPE UTILITY XL STRL (DRAPES) ×2 IMPLANT
ELECT COATED BLADE 2.86 ST (ELECTRODE) IMPLANT
ELECT REM PT RETURN 9FT ADLT (ELECTROSURGICAL) ×2
ELECTRODE REM PT RTRN 9FT ADLT (ELECTROSURGICAL) ×1 IMPLANT
GLOVE BIOGEL PI IND STRL 8 (GLOVE) ×1 IMPLANT
GLOVE BIOGEL PI INDICATOR 8 (GLOVE) ×1
GLOVE ECLIPSE 8.0 STRL XLNG CF (GLOVE) ×2 IMPLANT
NEEDLE HYPO 25X1 1.5 SAFETY (NEEDLE) ×2 IMPLANT
NS IRRIG 1000ML POUR BTL (IV SOLUTION) IMPLANT
PENCIL BUTTON HOLSTER BLD 10FT (ELECTRODE) IMPLANT
SPONGE LAP 4X18 X RAY DECT (DISPOSABLE) IMPLANT
STAPLER VISISTAT 35W (STAPLE) IMPLANT
STRIP CLOSURE SKIN 1/2X4 (GAUZE/BANDAGES/DRESSINGS) IMPLANT
SUCTION FRAZIER HANDLE 10FR (MISCELLANEOUS)
SUCTION TUBE FRAZIER 10FR DISP (MISCELLANEOUS) IMPLANT
SUT MON AB 4-0 PC3 18 (SUTURE) ×2 IMPLANT
SUT VICRYL 3-0 CR8 SH (SUTURE) ×2 IMPLANT
SYR CONTROL 10ML LL (SYRINGE) ×2 IMPLANT
TOWEL OR 17X24 6PK STRL BLUE (TOWEL DISPOSABLE) ×2 IMPLANT
TOWEL OR NON WOVEN STRL DISP B (DISPOSABLE) ×2 IMPLANT
TUBE CONNECTING 20X1/4 (TUBING) IMPLANT

## 2016-10-31 NOTE — Transfer of Care (Signed)
Immediate Anesthesia Transfer of Care Note  Patient: [name]  Procedure(s) Performed: Procedure(s) with comments: EXCISION CYST LEFT NECK (Left) - EXCISION CYST LEFT NECK  Patient Location: PACU  Anesthesia Type:Gen  Level of Consciousness: awake, sedated and responds to stimulation  Airway & Oxygen Therapy: Patient Spontanous Breathing and Patient connected to face mask oxygen  Post-op Assessment: Report given to RN, Post -op Vital signs reviewed and stable and Patient moving all extremities  Post vital signs: Reviewed and stable  Last Vitals:  Vitals:   10/31/16 1203  BP: (!) 97/54  Pulse: (!) 51  Resp: 18  Temp: 36.6 C    Last Pain:  Vitals:   10/31/16 1203  TempSrc: Oral         Complications: No apparent anesthesia complications

## 2016-10-31 NOTE — Anesthesia Postprocedure Evaluation (Signed)
Anesthesia Post Note  Patient: [name]  Procedure(s) Performed: Procedure(s) (LRB): EXCISION CYST LEFT NECK (Left)  Patient location during evaluation: PACU Anesthesia Type: General Level of consciousness: awake and alert Pain management: pain level controlled Vital Signs Assessment: post-procedure vital signs reviewed and stable Respiratory status: spontaneous breathing, nonlabored ventilation, respiratory function stable and patient connected to nasal cannula oxygen Cardiovascular status: blood pressure returned to baseline and stable Postop Assessment: no signs of nausea or vomiting Anesthetic complications: no    Last Vitals:  Vitals:   10/31/16 1415 10/31/16 1428  BP: 130/78 (!) 148/66  Pulse: (!) 51 (!) 50  Resp: (!) 22 16  Temp:  36.6 C    Last Pain:  Vitals:   10/31/16 1428  TempSrc:   PainSc: 0-No pain                 Effie Berkshire

## 2016-10-31 NOTE — Op Note (Signed)
Preoperative diagnosis: Left neck cyst  Postoperative diagnosis: Same  Procedure excision of left neck cyst 3 cm x 2 cm  Surgeon: Erroll Luna M.D.  Anesthesia: Mac with 0.25% Sensorcaine  EBL: Minimal  Specimen: Cyst as above  Indications for procedure: The patient's a 81 year old female who's had a draining cyst the left side of her neck for a number of months. It's gotten larger times and drained and and regrasped recurred. Upon examination, she had what appeared to be chronically infected epidermal inclusion cyst. A timeout examination she had no signs of any significant infection. I recommended excision in the operative room.The procedure has been discussed with the patient.  Alternative therapies have been discussed with the patient.  Operative risks include bleeding,  Infection,  Organ injury,  Nerve injury,  Blood vessel injury,  DVT,  Pulmonary embolism,  Death,  And possible reoperation.  Medical management risks include worsening of present situation.  The success of the procedure is 50 -90 % at treating patients symptoms.  The patient understands and agrees to proceed.   Description of procedure: The patient was met in holding area. The cyst on the left that was marked. Questions are answered. She's taken back to operating room and placed supine on the operating table. After general study was initiated, the left neck was prepped and draped in sterile fashion and timeout was done. Local anesthetic was infiltrated around the lesion. Curvilinear incision was made above and below the lesion. This was 3 cm x 2 cm. We was made hemostatic with cautery and closed with 3-0 Vicryl and 4-0 Monocryl. He agrees applied. All final counts found to be correct. The patient was awoke extubated taken to recovery in satisfactory condition.

## 2016-10-31 NOTE — Anesthesia Procedure Notes (Signed)
Procedure Name: LMA Insertion Date/Time: 10/31/2016 1:12 PM Performed by: Baxter Flattery Pre-anesthesia Checklist: Patient identified, Emergency Drugs available, Suction available and Patient being monitored Patient Re-evaluated:Patient Re-evaluated prior to inductionOxygen Delivery Method: Circle system utilized Preoxygenation: Pre-oxygenation with 100% oxygen Intubation Type: IV induction Ventilation: Mask ventilation without difficulty LMA: LMA flexible inserted LMA Size: 3.0 Number of attempts: 1 Airway Equipment and Method: Bite block Placement Confirmation: positive ETCO2 and breath sounds checked- equal and bilateral Tube secured with: Tape Dental Injury: Teeth and Oropharynx as per pre-operative assessment

## 2016-10-31 NOTE — Anesthesia Preprocedure Evaluation (Signed)
Anesthesia Evaluation  Patient identified by MRN, date of birth, ID band Patient awake    Reviewed: Allergy & Precautions, NPO status , Patient's Chart, lab work & pertinent test results  Airway Mallampati: II  TM Distance: >3 FB Neck ROM: Full    Dental no notable dental hx.    Pulmonary neg pulmonary ROS, COPD, Current Smoker,    Pulmonary exam normal breath sounds clear to auscultation       Cardiovascular hypertension, negative cardio ROS Normal cardiovascular exam Rhythm:Regular Rate:Normal     Neuro/Psych negative neurological ROS  negative psych ROS   GI/Hepatic negative GI ROS, Neg liver ROS,   Endo/Other  negative endocrine ROSHypothyroidism   Renal/GU negative Renal ROS  negative genitourinary   Musculoskeletal negative musculoskeletal ROS (+)   Abdominal   Peds negative pediatric ROS (+)  Hematology negative hematology ROS (+)   Anesthesia Other Findings   Reproductive/Obstetrics negative OB ROS                             Anesthesia Physical Anesthesia Plan  ASA: II  Anesthesia Plan: General   Post-op Pain Management:    Induction: Intravenous  Airway Management Planned: LMA  Additional Equipment:   Intra-op Plan:   Post-operative Plan: Extubation in OR  Informed Consent: I have reviewed the patients History and Physical, chart, labs and discussed the procedure including the risks, benefits and alternatives for the proposed anesthesia with the patient or authorized representative who has indicated his/her understanding and acceptance.   Dental advisory given  Plan Discussed with: CRNA  Anesthesia Plan Comments:         Anesthesia Quick Evaluation

## 2016-10-31 NOTE — Interval H&P Note (Signed)
History and Physical Interval Note:  10/31/2016 1:00 PM  [name]  has presented today for surgery, with the diagnosis of LEFT NECK CYST  The various methods of treatment have been discussed with the patient and family. After consideration of risks, benefits and other options for treatment, the patient has consented to  Procedure(s): EXCISION CYST LEFT NECK (Left) as a surgical intervention .  The patient's history has been reviewed, patient examined, no change in status, stable for surgery.  I have reviewed the patient's chart and labs.  Questions were answered to the patient's satisfaction.     Cristin Penaflor A.

## 2016-10-31 NOTE — H&P (View-Only) (Signed)
[name] 10/04/2016 10:46 AM Location: [facility] Patient #: [id number] DOB: [birth year] Married / Language: [name] / Race: White Female  History of Present Illness [name] A. Dawn Stith MD; 10/04/2016 11:04 AM) Patient words: Patient sent at the request of Dr. Forde [name] for a 3 cm mass on her left neck. She states she had a small cyst there for many years intermittent left neck. This then migrated her trachea about 3 cm it looks like it became larger and drained. She has no significant drainage now. It was erythematous with that is resolved on antibiotics. Denies sore throat, dysphagia, or difficulty swallowing. There's been no drainage from it the last few days. The drainage was white to yellow in nature and was minimal.  The patient is a 81 year old female.   Past Surgical History Dawn Lorenzo, LPN; 624THL [id number] AM) Gallbladder Surgery - Open  Allergies [name], CMA; 10/04/2016 10:47 AM) Ambien *HYPNOTICS/SEDATIVES/SLEEP DISORDER AGENTS* Cymbalta *ANTIDEPRESSANTS* Halcion *HYPNOTICS/SEDATIVES/SLEEP DISORDER AGENTS*  Medication History ([name], CMA; 10/04/2016 10:48 AM) BusPIRone HCl (10MG  Tablet, Oral) Active. Citalopram Hydrobromide (10MG  Tablet, Oral) Active. Levothyroxine Sodium (75MCG Tablet, Oral) Active. Ezetimibe (10MG  Tablet, Oral) Active. Potassium Chloride Crys ER (20MEQ Tablet ER, Oral) Active. Simvastatin (80MG  Tablet, Oral) Active. TraZODone HCl (50MG  Tablet, Oral) Active. Medications Reconciled  Social History Dawn Lorenzo, LPN; 624THL [id number] AM) Caffeine use Coffee. Tobacco use Current every day smoker.  Pregnancy / Birth History Dawn Lorenzo, LPN; 624THL [id number] AM) Age at menarche 60 years.     Review of Systems Dawn Billings Dockery LPN; 624THL [id number] AM) General Not Present- Appetite Loss, Chills, Fatigue, Fever, Night Sweats, Weight Gain and Weight Loss. Skin Present- Change in Wart/Mole. Not  Present- Dryness, Hives, Jaundice, New Lesions, Non-Healing Wounds, Rash and Ulcer. Female Genitourinary Not Present- Frequency, Nocturia, Painful Urination, Pelvic Pain and Urgency. Musculoskeletal Not Present- Back Pain, Joint Pain, Joint Stiffness, Muscle Pain, Muscle Weakness and Swelling of Extremities. Endocrine Not Present- Cold Intolerance, Excessive Hunger, Hair Changes, Heat Intolerance, Hot flashes and New Diabetes. Hematology Not Present- Blood Thinners, Easy Bruising, Excessive bleeding, Gland problems, HIV and Persistent Infections.  Vitals ([name] CMA; 10/04/2016 10:47 AM) 10/04/2016 10:46 AM Weight: 106 lb Height: 63in Body Surface Area: 1.48 m Body Mass Index: 18.78 kg/m  Temp.: 97.2F(Temporal)  Pulse: 76 (Regular)  BP: 124/80 (Sitting, Left Arm, Standard)      Physical Exam ([name] A. Dawn Fullam MD; 10/04/2016 11:05 AM)  General Mental Status-Alert. General Appearance-Consistent with stated age. Hydration-Well hydrated. Voice-Normal.  Integumentary Note: Left neck is a 3 cm x 3 cm ulcerated mass in the skin. This is mobile and not fixed to any underlying structures. No signs of active infection.  Head and Neck Note: Thyroid normal. No lymphadenopathy. Trachea midline.  Chest and Lung Exam Chest and lung exam reveals -quiet, even and easy respiratory effort with no use of accessory muscles and on auscultation, normal breath sounds, no adventitious sounds and normal vocal resonance. Inspection Chest Wall - Normal. Back - normal.  Cardiovascular Cardiovascular examination reveals -normal heart sounds, regular rate and rhythm with no murmurs and normal pedal pulses bilaterally.  Neurologic Neurologic evaluation reveals -alert and oriented x 3 with no impairment of recent or remote memory. Mental Status-Normal.  Musculoskeletal Normal Exam - Left-Upper Extremity Strength Normal and Lower Extremity Strength Normal. Normal  Exam - Right-Upper Extremity Strength Normal and Lower Extremity Strength Normal.  Lymphatic Head & Neck  General Head & Neck Lymphatics: Bilateral - Description - Normal. Axillary  General Axillary Region: Bilateral - Description - Normal. Tenderness - Non Tender.    Assessment & Plan ([name] A. Naturi Alarid MD; 10/04/2016 11:05 AM)  CYST OF NECK (L72.3) Impression: Recommend excision of left neck cyst. Risks, benefits and alternative therapies were discussed with the patient. Risk of bleeding, infection, injury to structure in neck, anesthesia complications, stroke, DVT, death, cardiovascular events and the need further procedures.  Current Plans The pathophysiology of skin & subcutaneous masses was discussed. Natural history risks without surgery were discussed. I recommended surgery to remove the mass. I explained the technique of removal with use of local anesthesia & possible need for more aggressive sedation/anesthesia for patient comfort.  Risks such as bleeding, infection, wound breakdown, heart attack, death, and other risks were discussed. I noted a good likelihood this will help address the problem. Possibility that this will not correct all symptoms was explained. Possibility of regrowth/recurrence of the mass was discussed. We will work to minimize complications. Questions were answered. The patient expresses understanding & wishes to proceed with surgery.  Pt Education - [facility] Free Text Education/Instructions: discussed with patient and provided information.

## 2016-10-31 NOTE — Discharge Instructions (Signed)
°Post Anesthesia Home Care Instructions ° °Activity: °Get plenty of rest for the remainder of the day. A responsible adult should stay with you for 24 hours following the procedure.  °For the next 24 hours, DO NOT: °-Drive a car °-Operate machinery °-Drink alcoholic beverages °-Take any medication unless instructed by your physician °-Make any legal decisions or sign important papers. ° °Meals: °Start with liquid foods such as gelatin or soup. Progress to regular foods as tolerated. Avoid greasy, spicy, heavy foods. If nausea and/or vomiting occur, drink only clear liquids until the nausea and/or vomiting subsides. Call your physician if vomiting continues. ° °Special Instructions/Symptoms: °Your throat may feel dry or sore from the anesthesia or the breathing tube placed in your throat during surgery. If this causes discomfort, gargle with warm salt water. The discomfort should disappear within 24 hours. ° °If you had a scopolamine patch placed behind your ear for the management of post- operative nausea and/or vomiting: ° °1. The medication in the patch is effective for 72 hours, after which it should be removed.  Wrap patch in a tissue and discard in the trash. Wash hands thoroughly with soap and water. °2. You may remove the patch earlier than 72 hours if you experience unpleasant side effects which may include dry mouth, dizziness or visual disturbances. °3. Avoid touching the patch. Wash your hands with soap and water after contact with the patch. °  °GENERAL SURGERY: POST OP INSTRUCTIONS ° °###################################################################### ° °EAT °Gradually transition to a high fiber diet with a fiber supplement over the next few weeks after discharge.  Start with a pureed / full liquid diet (see below) ° °WALK °Walk an hour a day.  Control your pain to do that.   ° °CONTROL PAIN °Control pain so that you can walk, sleep, tolerate sneezing/coughing, go up/down stairs. ° °HAVE A BOWEL  MOVEMENT DAILY °Keep your bowels regular to avoid problems.  OK to try a laxative to override constipation.  OK to use an antidairrheal to slow down diarrhea.  Call if not better after 2 tries ° °CALL IF YOU HAVE PROBLEMS/CONCERNS °Call if you are still struggling despite following these instructions. °Call if you have concerns not answered by these instructions ° °###################################################################### ° ° ° °1. DIET: Follow a light bland diet the first 24 hours after arrival home, such as soup, liquids, crackers, etc.  Be sure to include lots of fluids daily.  Avoid fast food or heavy meals as your are more likely to get nauseated.   °2. Take your usually prescribed home medications unless otherwise directed. °3. PAIN CONTROL: °a. Pain is best controlled by a usual combination of three different methods TOGETHER: °i. Ice/Heat °ii. Over the counter pain medication °iii. Prescription pain medication °b. Most patients will experience some swelling and bruising around the incisions.  Ice packs or heating pads (30-60 minutes up to 6 times a day) will help. Use ice for the first few days to help decrease swelling and bruising, then switch to heat to help relax tight/sore spots and speed recovery.  Some people prefer to use ice alone, heat alone, alternating between ice & heat.  Experiment to what works for you.  Swelling and bruising can take several weeks to resolve.   °c. It is helpful to take an over-the-counter pain medication regularly for the first few weeks.  Choose one of the following that works best for you: °i. Naproxen (Aleve, etc)  Two 220mg tabs twice a day °ii. Ibuprofen (Advil, etc)   Three 200mg tabs four times a day (every meal & bedtime) °iii. Acetaminophen (Tylenol, etc) 500-650mg four times a day (every meal & bedtime) °d. A  prescription for pain medication (such as oxycodone, hydrocodone, etc) should be given to you upon discharge.  Take your pain medication as  prescribed.  °i. If you are having problems/concerns with the prescription medicine (does not control pain, nausea, vomiting, rash, itching, etc), please call us [phone] to see if we need to switch you to a different pain medicine that will work better for you and/or control your side effect better. °ii. If you need a refill on your pain medication, please contact your pharmacy.  They will contact our office to request authorization. Prescriptions will not be filled after 5 pm or on week-ends. °4. Avoid getting constipated.  Between the surgery and the pain medications, it is common to experience some constipation.  Increasing fluid intake and taking a fiber supplement (such as Metamucil, Citrucel, FiberCon, MiraLax, etc) 1-2 times a day regularly will usually help prevent this problem from occurring.  A mild laxative (prune juice, Milk of Magnesia, MiraLax, etc) should be taken according to package directions if there are no bowel movements after 48 hours.   °5. Wash / shower every day.  You may shower over the dressings as they are waterproof.  Continue to shower over incision(s) after the dressing is off. °6. Remove your waterproof bandages 5 days after surgery.  You may leave the incision open to air.  You may have skin tapes (Steri Strips) covering the incision(s).  Leave them on until one week, then remove.  You may replace a dressing/Band-Aid to cover the incision for comfort if you wish.  ° ° ° ° °7. ACTIVITIES as tolerated:   °a. You may resume regular (light) daily activities beginning the next day--such as daily self-care, walking, climbing stairs--gradually increasing activities as tolerated.  If you can walk 30 minutes without difficulty, it is safe to try more intense activity such as jogging, treadmill, bicycling, low-impact aerobics, swimming, etc. °b. Save the most intensive and strenuous activity for last such as sit-ups, heavy lifting, contact sports, etc  Refrain from any heavy lifting or  straining until you are off narcotics for pain control.   °c. DO NOT PUSH THROUGH PAIN.  Let pain be your guide: If it hurts to do something, don't do it.  Pain is your body warning you to avoid that activity for another week until the pain goes down. °d. You may drive when you are no longer taking prescription pain medication, you can comfortably wear a seatbelt, and you can safely maneuver your car and apply brakes. °e. You may have sexual intercourse when it is comfortable.  °8. FOLLOW UP in our office °a. Please call [facility] at [phone] to set up an appointment to see your surgeon in the office for a follow-up appointment approximately 2-3 weeks after your surgery. °b. Make sure that you call for this appointment the day you arrive home to insure a convenient appointment time. °9. IF YOU HAVE DISABILITY OR FAMILY LEAVE FORMS, BRING THEM TO THE OFFICE FOR PROCESSING.  DO NOT GIVE THEM TO YOUR DOCTOR. ° ° °WHEN TO CALL US [phone]: °1. Poor pain control °2. Reactions / problems with new medications (rash/itching, nausea, etc)  °3. Fever over 101.5 F (38.5 C) °4. Worsening swelling or bruising °5. Continued bleeding from incision. °6. Increased pain, redness, or drainage from the incision °7. Difficulty breathing /   swallowing ° ° The clinic staff is available to answer your questions during regular business hours (8:30am-5pm).  Please don’t hesitate to call and ask to speak to one of our nurses for clinical concerns.  ° If you have a medical emergency, go to the nearest emergency room or call 911. ° A surgeon from [facility] is always on call at the hospitals ° ° °[facility] °[address], [address], [place], [place]  [address] ? °MAIN: [phone] ? TOLL FREE: [phone] ?  °FAX [phone] °[email or web address] ° °

## 2016-11-01 ENCOUNTER — Encounter (HOSPITAL_BASED_OUTPATIENT_CLINIC_OR_DEPARTMENT_OTHER): Payer: Self-pay | Admitting: Surgery

## 2016-11-01 NOTE — Addendum Note (Signed)
Addendum  created 11/01/16 1343 by Baxter Flattery, CRNA   Charge Capture section accepted

## 2017-05-05 DIAGNOSIS — D649 Anemia, unspecified: Secondary | ICD-10-CM | POA: Diagnosis not present

## 2017-05-05 DIAGNOSIS — E538 Deficiency of other specified B group vitamins: Secondary | ICD-10-CM | POA: Diagnosis not present

## 2017-05-05 DIAGNOSIS — M799 Soft tissue disorder, unspecified: Secondary | ICD-10-CM | POA: Diagnosis not present

## 2017-05-05 DIAGNOSIS — K635 Polyp of colon: Secondary | ICD-10-CM | POA: Diagnosis not present

## 2017-05-05 DIAGNOSIS — R7301 Impaired fasting glucose: Secondary | ICD-10-CM | POA: Diagnosis not present

## 2019-05-21 DIAGNOSIS — K635 Polyp of colon: Secondary | ICD-10-CM | POA: Diagnosis not present

## 2019-05-21 DIAGNOSIS — F5104 Psychophysiologic insomnia: Secondary | ICD-10-CM | POA: Diagnosis not present

## 2019-05-21 DIAGNOSIS — R7301 Impaired fasting glucose: Secondary | ICD-10-CM | POA: Diagnosis not present

## 2019-05-21 DIAGNOSIS — J449 Chronic obstructive pulmonary disease, unspecified: Secondary | ICD-10-CM | POA: Diagnosis not present

## 2019-06-04 DIAGNOSIS — E559 Vitamin D deficiency, unspecified: Secondary | ICD-10-CM | POA: Diagnosis not present

## 2019-06-04 DIAGNOSIS — E7849 Other hyperlipidemia: Secondary | ICD-10-CM | POA: Diagnosis not present

## 2019-06-04 DIAGNOSIS — E538 Deficiency of other specified B group vitamins: Secondary | ICD-10-CM | POA: Diagnosis not present

## 2019-06-04 DIAGNOSIS — E038 Other specified hypothyroidism: Secondary | ICD-10-CM | POA: Diagnosis not present

## 2019-06-04 DIAGNOSIS — R7301 Impaired fasting glucose: Secondary | ICD-10-CM | POA: Diagnosis not present

## 2019-09-07 DIAGNOSIS — M859 Disorder of bone density and structure, unspecified: Secondary | ICD-10-CM | POA: Diagnosis not present

## 2019-10-08 DIAGNOSIS — Z23 Encounter for immunization: Secondary | ICD-10-CM | POA: Diagnosis not present

## 2020-09-14 DIAGNOSIS — E43 Unspecified severe protein-calorie malnutrition: Secondary | ICD-10-CM | POA: Diagnosis not present

## 2020-09-14 DIAGNOSIS — G3184 Mild cognitive impairment, so stated: Secondary | ICD-10-CM | POA: Diagnosis not present

## 2020-09-14 DIAGNOSIS — J449 Chronic obstructive pulmonary disease, unspecified: Secondary | ICD-10-CM | POA: Diagnosis not present

## 2020-09-14 DIAGNOSIS — E039 Hypothyroidism, unspecified: Secondary | ICD-10-CM | POA: Diagnosis not present

## 2020-09-14 DIAGNOSIS — F320 Major depressive disorder, single episode, mild: Secondary | ICD-10-CM | POA: Diagnosis not present

## 2020-09-14 DIAGNOSIS — R7301 Impaired fasting glucose: Secondary | ICD-10-CM | POA: Diagnosis not present

## 2020-09-14 DIAGNOSIS — E538 Deficiency of other specified B group vitamins: Secondary | ICD-10-CM | POA: Diagnosis not present

## 2020-09-14 DIAGNOSIS — Z23 Encounter for immunization: Secondary | ICD-10-CM | POA: Diagnosis not present

## 2020-09-14 DIAGNOSIS — E785 Hyperlipidemia, unspecified: Secondary | ICD-10-CM | POA: Diagnosis not present

## 2020-09-14 DIAGNOSIS — I10 Essential (primary) hypertension: Secondary | ICD-10-CM | POA: Diagnosis not present

## 2020-09-14 DIAGNOSIS — E559 Vitamin D deficiency, unspecified: Secondary | ICD-10-CM | POA: Diagnosis not present

## 2020-10-02 DIAGNOSIS — N838 Other noninflammatory disorders of ovary, fallopian tube and broad ligament: Secondary | ICD-10-CM | POA: Diagnosis not present

## 2020-10-02 DIAGNOSIS — K5730 Diverticulosis of large intestine without perforation or abscess without bleeding: Secondary | ICD-10-CM | POA: Diagnosis not present

## 2020-10-02 DIAGNOSIS — K5900 Constipation, unspecified: Secondary | ICD-10-CM | POA: Diagnosis not present

## 2020-11-20 ENCOUNTER — Encounter: Payer: Self-pay | Admitting: Obstetrics and Gynecology

## 2020-11-20 ENCOUNTER — Ambulatory Visit: Payer: Medicare HMO | Admitting: Obstetrics and Gynecology

## 2020-11-20 ENCOUNTER — Other Ambulatory Visit: Payer: Self-pay

## 2020-11-20 VITALS — BP 122/74 | Ht 62.0 in | Wt 110.0 lb

## 2020-11-20 DIAGNOSIS — N949 Unspecified condition associated with female genital organs and menstrual cycle: Secondary | ICD-10-CM | POA: Diagnosis not present

## 2020-11-20 NOTE — Progress Notes (Signed)
[name] May 24, 1935 [id number]  SUBJECTIVE:  85 y.o. G3P3 female new patient venting as referral from her primary doctor for evaluation of right lower quadrant pain of about 6 weeks duration and findings of right adnexal cysts on CT imaging (imaging obtained through [facility]).  The patient currently has been without any abdominal pain for the last 2 weeks.  She feels well overall.  Review of systems negative as noted below.  She presents with her husband today as she has some mild dementia.  Her surgical history is notable for small bowel surgeries after attempted cholecystectomy with findings of cholecystoduodenal fistula and subsequent abdominal abscess drainage.    Current Outpatient Medications  Medication Sig Dispense Refill  . cholecalciferol (VITAMIN D) 1000 UNITS tablet Take 1,000 Units by mouth daily.    . Cyanocobalamin (VITAMIN B 12 PO) Take by mouth.    . MELATONIN PO Take by mouth.    . mirabegron ER (MYRBETRIQ) 25 MG TB24 tablet Take 25 mg by mouth daily.    . Probiotic Product (PROBIOTIC PO) Take by mouth.    . rosuvastatin (CRESTOR) 10 MG tablet Take 10 mg by mouth daily.     No current facility-administered medications for this visit.   Allergies: Ambien [zolpidem tartrate], Cymbalta [duloxetine hcl], and Halcion [triazolam]  No LMP recorded. Patient is postmenopausal.  Past medical history,surgical history, problem list, medications, allergies, family history and social history were all reviewed and documented as reviewed in the EPIC chart.  Past Medical History:  Diagnosis Date  . Anemia   . Bowel obstruction ([place])   . Calculus of gallbladder with acute cholecystitis, without mention of obstruction 10/01/2013   Open chole on 10/26/13. 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 COPD (chronic obstructive pulmonary disease) ([place])   . Depression    "sold beach house 01/2012" (10/25/2013)  . H/O sebaceous  cyst   . Hepatic cyst 10/01/2013   CT  . History of diverticular abscess 09/2013  . Hyperlipemia   . Hypertension    "was taking RX; took me off it 09/2013" (10/25/2013)  . Hypothyroidism    goiter  . Insomnia   . Osteopenia   . Protein calorie malnutrition ([facility])    malabsorption  . Seasonal allergies   . Vitamin B12 deficiency    Past Surgical History:  Procedure Laterality Date  . ABCESS DRAINAGE  2014   "had drain put in & stayed for ~ 1 wk to 10 day; just came out ~ 10 days ago" (10/25/2013)  . CATARACT EXTRACTION W/ INTRAOCULAR LENS IMPLANT Left ~ 2010  . CHOLECYSTECTOMY N/A 10/26/2013   Procedure: ATTEMPTED  LAPAROSCOPIC CONVERTED TO OPEN CHOLECYSTECTOMY WITH INTRAOPERATIVE CHOLANGIOGRAM; REPAIR OF [place] FISTULA; cholecystoduodenal fistula repair; lysis of adhesions; drainage of pelvic abscess; omental patching of duodenal repair;  Surgeon: Joyice Faster. Cornett, MD;  Location: [facility];  Service: General;  Laterality: N/A;  . COLON RESECTION    . DILATION AND CURETTAGE OF UTERUS    . EXCISION MASS NECK Left 10/31/2016   Procedure: EXCISION CYST LEFT NECK;  Surgeon: Erroll Luna, MD;  Location: [facility];  Service: General;  Laterality: Left;  EXCISION CYST LEFT NECK    ROS:  Feeling well. No dyspnea or chest pain on exertion.  No abdominal pain, change in bowel habits, black or bloody stools.  No urinary tract symptoms. GYN ROS: No vaginal bleeding, pelvic pain or discharge.   OBJECTIVE:  BP 122/74  Ht 5\' 2"  (1.575 m)   Wt 110 lb (49.9 kg)   BMI 20.12 kg/m  The patient appears well, alert, oriented x 3, in no distress. PELVIC EXAM: VULVA: Deferred to future visit as patient is asymptomatic today   ASSESSMENT:  85 y.o. G3P3 here for evaluation of potential right adnexal cysts  PLAN:  The patient is currently asymptomatic.  CT images are not available for review today as we just have access to the radiology report through care everywhere which  does indicate multiple right adnexal cysts.  We discussed that CT imaging provides limited characterization of the adnexa typically so I would recommend we proceed with a pelvic ultrasound.  The patient will get this scheduled.   [name] MD 11/20/20

## 2020-12-07 ENCOUNTER — Ambulatory Visit: Payer: Medicare HMO | Admitting: Obstetrics and Gynecology

## 2020-12-07 ENCOUNTER — Other Ambulatory Visit: Payer: Medicare HMO

## 2021-01-04 ENCOUNTER — Encounter: Payer: Self-pay | Admitting: Obstetrics and Gynecology

## 2021-01-04 ENCOUNTER — Other Ambulatory Visit: Payer: Self-pay

## 2021-01-04 ENCOUNTER — Ambulatory Visit (INDEPENDENT_AMBULATORY_CARE_PROVIDER_SITE_OTHER): Payer: Medicare HMO

## 2021-01-04 ENCOUNTER — Ambulatory Visit: Payer: Medicare HMO | Admitting: Obstetrics and Gynecology

## 2021-01-04 VITALS — BP 120/78

## 2021-01-04 DIAGNOSIS — N83201 Unspecified ovarian cyst, right side: Secondary | ICD-10-CM | POA: Diagnosis not present

## 2021-01-04 DIAGNOSIS — R9389 Abnormal findings on diagnostic imaging of other specified body structures: Secondary | ICD-10-CM | POA: Diagnosis not present

## 2021-01-04 DIAGNOSIS — N949 Unspecified condition associated with female genital organs and menstrual cycle: Secondary | ICD-10-CM | POA: Diagnosis not present

## 2021-01-04 NOTE — Progress Notes (Signed)
[name] [birth year] [id number]  SUBJECTIVE:  86 y.o. G3P3 female presents for follow-up pelvic exam and pelvic ultrasound to continue evaluation of possible right adnexal cyst identified incidentally on CT imaging when she was being worked up for RLQ pain.  She currently is without any abdominal pain.  She has a complex abdominal surgical history as reviewed in the last note from 11/20/2020.  Her husband again accompanies her to the visit today (patient with dementia).   Current Outpatient Medications  Medication Sig Dispense Refill  . cholecalciferol (VITAMIN D) 1000 UNITS tablet Take 1,000 Units by mouth daily.    . Cyanocobalamin (VITAMIN B 12 PO) Take by mouth.    . MELATONIN PO Take by mouth.    . mirabegron ER (MYRBETRIQ) 25 MG TB24 tablet Take 25 mg by mouth daily.    . Probiotic Product (PROBIOTIC PO) Take by mouth.    . rosuvastatin (CRESTOR) 10 MG tablet Take 10 mg by mouth daily.     No current facility-administered medications for this visit.   Allergies: Ambien [zolpidem tartrate], Cymbalta [duloxetine hcl], and Halcion [triazolam]  No LMP recorded. Patient is postmenopausal.  Past medical history,surgical history, problem list, medications, allergies, family history and social history were all reviewed and documented as reviewed in the EPIC chart.   OBJECTIVE:  BP 120/78 (BP Location: Right Arm, Patient Position: Sitting, Cuff Size: Normal)  The patient appears well, alert, in no distress. PELVIC EXAM: VULVA: normal appearing vulva with atrophic change, no masses, tenderness or lesions, VAGINA: normal appearing vagina with atrophic change, normal color and discharge, no lesions, CERVIX: normal appearing cervix without discharge or lesions, UTERUS: uterus is normal size, shape, consistency and nontender, ADNEXA: left normal adnexa in size, nontender and no masses, right ovary mildly enlarged with no tenderness, mobile palpable cysts about 3 cm in size.  No palpable  groin lymphadenopathy.  Endometrial Biopsy Procedure Note [name] 12-31-1934 [id number] We discussed the biopsy procedure in detail, including the expected discomfort, prior to proceeding with the indication being the thickened endometrium identified on the ultrasound imaging today.  She gave verbal consent to proceed.  The cervix was cleansed with a Betadine swab x3.  The anterior lip of the cervix was grasped with a single-tooth tenaculum.  The endometrial Pipelle sampler was easily introduced into the endometrial cavity and sound length measurement was taken (5.5 cm).  The plunger was withdrawn causing suction in the Pipelle and the device was moved about the cavity for about 10 sec.  The Pipelle was removed then the sample was emptied in a specimen cup, maintaining sterility.  The Pipelle was reintroduced into the endometrial cavity to collect any remaining sample.  Minimal tissue was obtained.  The specimen contents were collected and sent to pathology for analysis.  The patient tolerated the procedure well without any complication.  Chaperone: Royal Hawthorn present during the examination   Pelvic ultrasound Retroverted atrophic uterus 4.6 x 2.9 x 2.3 cm.  Endometrium 4.2 mm.  Cystic spaces within the endometrium, avascular. Right ovary with multiple cysts, 2 tiny cyst measuring 1.8 x 1.4 x 1.6 and 1.5 x 1.6 x 1.6 cm, thin-walled and lucent.  Cysts are either coalesced together with a larger tissue bridge division between, or this represents one  single cyst with thick septation in between.  Larger third cyst with thin walls and avascular appearance, measuring 3.4 x 2.4 x 2.9 cm.  Two pelvic fluid collections measuring 1.6 x 1.6 cm, possibly representative of  pelvic free fluid or loculation from prior abdominal surgeries/bowel spill. Left ovary with 5 mm follicle. No other adnexal masses.  ASSESSMENT:  86 y.o. G3P3 with right ovarian cysts and thickened endometrium  PLAN:  We discussed  the overall simple appearance of the cysts.  We discussed there are no features that are overly concerning for malignancy, but this question cannot be definitively answered based on imaging alone.  The patient will go to lab to have tumor markers drawn CA125 and CEA, which could add some additional information but even if in a normal range are also not any guarantee of a benign process going on with the right ovary.  Pelvic exam was unremarkable and she is currently asymptomatic.  We will await the results of the tumor markers and if both are within the normal range then I recommended ultrasound follow-up in 2 to 3 months to make sure the cysts are remaining visually stable. We did collect an endometrial biopsy today because there was endometrial thickening identified on the ultrasound, and minimal tissue was retrieved so I suspect that this will either be benign atrophic tissue or possibly insufficient for analysis.  In any event she is not having any vaginal bleeding so I do not think we would necessarily need to work up the very mildly thickened appearance of the endometrium any further unless she were to develop spontaneous bleeding.   [name] MD 01/04/21

## 2021-01-04 NOTE — Patient Instructions (Addendum)
You have a cluster of cysts on your right ovary.  Cyst are water-filled structures on the ovary.  Your cysts appear just to have fluid and not much in the way of solid bits or extra blood flow, which can be found in cases of cancerous cysts.  We do not know if cysts are benign or cancerous until they are removed by surgery.  We do not need to remove all cysts by surgery. I will have you check some blood tests today that will help [place] in estimating whether or not these cysts are more likely to be benign or cancerous. We also performed an endometrial biopsy today because the lining inside of the uterus appeared slightly thickened on the ultrasound, and we retrieved minimal tissue so I do not think there is anything bad going on inside of the uterus (but we had to do this just to be sure).  We will let you know about the biopsy result next week when it is available.    Ovarian Cyst  An ovarian cyst is a fluid-filled sac that forms on an ovary. The ovaries are small organs that produce eggs in women. Various types of cysts can form on the ovaries. Some may cause symptoms and require treatment. Most ovarian cysts go away on their own, are not cancerous (are benign), and do not cause problems. What are the causes? Ovarian cysts may be caused by:  Ovarian hyperstimulation syndrome. This is a condition that can develop from taking fertility medicines. It causes multiple large ovarian cysts to form.  Polycystic ovarian syndrome (PCOS). This is a common hormonal disorder that can cause ovarian cysts to form, and can cause problems with your period or fertility.  The normal menstrual cycle. What increases the risk? The following factors may make you more likely to develop this condition:  Being overweight or obese.  Taking fertility medicines.  Taking certain forms of hormonal birth control.  Smoking. What are the signs or symptoms? Many ovarian cysts do not cause symptoms. If symptoms are present,  they may include:  Pelvic pain or pressure.  Pain in the lower abdomen.  Pain during sex.  Abdominal swelling.  Abnormal menstrual periods.  Increasing pain with menstrual periods. How is this diagnosed? These cysts are commonly found during a routine pelvic exam. You may have tests to find out more about the cyst, such as:  Ultrasound.  CT scan.  MRI.  Blood tests. How is this treated? Many ovarian cysts go away on their own without treatment. Your health care provider may want to check your cyst regularly for 2-3 months to see if it changes. If you are in menopause, it is especially important to have your cyst monitored closely because menopausal women have a higher rate of ovarian cancer. When treatment is needed, it may include:  Medicines to help relieve pain.  A procedure to drain the cyst (aspiration).  Surgery to remove the whole cyst (cystectomy).  Hormone treatment or birth control pills. These methods are sometimes used to help keep cysts from coming back.  Surgery to remove the ovary (oophorectomy). Follow these instructions at home:  Take over-the-counter and prescription medicines only as told by your health care provider.  Ask your health care provider if any medicine prescribed to you requires you to avoid driving or using machinery.  Get regular pelvic exams and Pap tests as often as told by your health care provider.  Return to your normal activities as told by your health care provider.  Ask your health care provider what activities are safe for you.  Do not use any products that contain nicotine or tobacco, such as cigarettes, e-cigarettes, and chewing tobacco. If you need help quitting, ask your health care provider.  Keep all follow-up visits. This is important. Contact a health care provider if:  Your periods are late, irregular, painful, or they stop.  You have pelvic pain that does not go away.  You have pressure on your bladder or  trouble emptying your bladder completely.  You have any of the following: ? A feeling of fullness. ? You are gaining weight or losing weight without changing your exercise and eating habits. ? Pain, swelling, or bloating in the abdomen. ? Loss of appetite. ? Pain and pressure in your back and pelvis.  You think you may be pregnant. Get help right away if:  You have abdominal or pelvic pain that is severe or gets worse.  You cannot eat or drink without vomiting.  You suddenly develop a fever or chills.  Your menstrual period is much heavier than usual. Summary  An ovarian cyst is a fluid-filled sac that forms on an ovary.  Some ovarian cysts may cause symptoms and require treatment.  These cysts are commonly found during a routine pelvic exam.  Many ovarian cysts go away on their own without treatment. This information is not intended to replace advice given to you by your health care provider. Make sure you discuss any questions you have with your health care provider. Document Revised: 05/11/2020 Document Reviewed: 05/11/2020 Elsevier Patient Education  2021 [place].  Endometrial Biopsy  An endometrial biopsy is a procedure to remove tissue samples from the endometrium, which is the lining of the uterus. The tissue that is removed can then be checked under a microscope for disease. This procedure is used to diagnose conditions such as endometrial cancer, endometrial tuberculosis, polyps, or other inflammatory conditions. This procedure may also be used to investigate uterine bleeding to determine where you are in your menstrual cycle or how your hormone levels are affecting the lining of the uterus. Tell a health care provider about:  Any allergies you have.  All medicines you are taking, including vitamins, herbs, eye drops, creams, and over-the-counter medicines.  Any problems you or family members have had with anesthetic medicines.  Any blood disorders you  have.  Any surgeries you have had.  Any medical conditions you have.  Whether you are pregnant or may be pregnant. What are the risks? Generally, this is a safe procedure. However, problems may occur, including:  Bleeding.  Pelvic infection.  Puncture of the wall of the uterus with the biopsy device (rare).  Allergic reactions to medicines. What happens before the procedure?  Keep a record of your menstrual cycles as told by your health care provider. You may need to schedule your procedure for a specific time in your cycle.  You may want to bring a sanitary pad to wear after the procedure.  Plan to have someone take you home from the hospital or clinic.  Ask your health care provider about: ? Changing or stopping your regular medicines. This is especially important if you are taking diabetes medicines, arthritis medicines, or blood thinners. ? Taking medicines such as aspirin and ibuprofen. These medicines can thin your blood. Do not take these medicines unless your health care provider tells you to take them. ? Taking over-the-counter medicines, vitamins, herbs, and supplements. What happens during the procedure?  You will lie  on an exam table with your feet and legs supported as in a pelvic exam.  Your health care provider will insert an instrument (speculum) into your vagina to see your cervix.  Your cervix will be cleansed with an antiseptic solution.  A medicine (local anesthetic) will be used to numb the cervix.  A forceps instrument (tenaculum) will be used to hold your cervix steady for the biopsy.  A thin, rod-like instrument (uterine sound) will be inserted through your cervix to determine the length of your uterus and the location where the biopsy sample will be removed.  A thin, flexible tube (catheter) will be inserted through your cervix and into the uterus. The catheter will be used to collect the biopsy sample from your endometrial tissue.  The catheter  and speculum will then be removed, and the tissue sample will be sent to a lab for examination. The procedure may vary among health care providers and hospitals. What can I expect after procedure?  You will rest in a recovery area until you are ready to go home.  You may have mild cramping and a small amount of vaginal bleeding. This is normal.  You may have a small amount of vaginal bleeding for a few days. This is normal.  It is up to you to get the results of your procedure. Ask your health care provider, or the department that is doing the procedure, when your results will be ready. Follow these instructions at home:  Take over-the-counter and prescription medicines only as told by your health care provider.  Do not douche, use tampons, or have sexual intercourse until your health care provider approves.  Return to your normal activities as told by your health care provider. Ask your health care provider what activities are safe for you.  Follow instructions from your health care provider about any activity restrictions, such as restrictions on strenuous exercise or heavy lifting.  Keep all follow-up visits. This is important. Contact a health care provider:  You have heavy bleeding, or bleed for longer than 2 days after the procedure.  You have bad smelling discharge from your vagina.  You have a fever or chills.  You have a burning sensation when urinating or you have difficulty urinating.  You have severe pain in your lower abdomen. Get help right away if you:  You have severe cramps in your stomach or back.  You pass large blood clots.  Your bleeding increases.  You become weak or light-headed, or you faint or lose consciousness. Summary  An endometrial biopsy is a procedure to remove tissue samples is taken from the endometrium, which is the lining of the uterus.  The tissue sample that is removed will be checked under a microscope for disease.  This procedure  is used to diagnose conditions such as endometrial cancer, endometrial tuberculosis, polyps, or other inflammatory conditions.  After the procedure, it is common to have mild cramping and a small amount of vaginal bleeding for a few days.  Do not douche, use tampons, or have sexual intercourse until your health care provider approves. Ask your health care provider which activities are safe for you. This information is not intended to replace advice given to you by your health care provider. Make sure you discuss any questions you have with your health care provider. Document Revised: 06/26/2020 Document Reviewed: 06/26/2020 Elsevier Patient Education  2021 [employer].

## 2021-01-04 NOTE — Addendum Note (Signed)
Addended by: Lorine Bears on: 01/04/2021 02:17 PM   Modules accepted: Orders

## 2021-01-04 NOTE — Addendum Note (Signed)
Addended by: Joaquin Music on: 01/04/2021 02:15 PM   Modules accepted: Orders

## 2021-01-04 NOTE — Addendum Note (Signed)
Addended by: [name] D on: 01/04/2021 05:44 PM   Modules accepted: Orders

## 2021-01-08 LAB — PATHOLOGY REPORT

## 2021-01-08 LAB — TISSUE SPECIMEN

## 2021-01-10 NOTE — Progress Notes (Signed)
Please tell patient that the endometrial biopsy contained little tissue but did show some sort of 'rare cystic gland.'  No evidence of hyperplasia or cancer in the specimen but limited evaluation due to the small amount of tissue.  Since she is not having any vaginal bleeding and the endometrium measured just tenths of a millimeter over the normal limit, I think it would be reasonable just to have her monitor for any signs of spontaneous bleeding beyond the next month as she recovers from the biopsy, and to just have a look at the endometrium again on her next ultrasound when she comes in to reevaluate the ovarian cyst.  I also suggested doing a CA-125 which does not appear she has done yet so please have her do this at her convenience.

## 2021-01-11 ENCOUNTER — Other Ambulatory Visit: Payer: Self-pay

## 2021-01-11 ENCOUNTER — Other Ambulatory Visit (INDEPENDENT_AMBULATORY_CARE_PROVIDER_SITE_OTHER): Payer: Medicare HMO

## 2021-01-11 DIAGNOSIS — R1909 Other intra-abdominal and pelvic swelling, mass and lump: Secondary | ICD-10-CM | POA: Diagnosis not present

## 2021-01-11 DIAGNOSIS — R9389 Abnormal findings on diagnostic imaging of other specified body structures: Secondary | ICD-10-CM | POA: Diagnosis not present

## 2021-01-11 DIAGNOSIS — N83201 Unspecified ovarian cyst, right side: Secondary | ICD-10-CM

## 2021-01-12 LAB — CA 125: CA 125: 15 U/mL (ref <35)

## 2021-01-31 DIAGNOSIS — I10 Essential (primary) hypertension: Secondary | ICD-10-CM | POA: Diagnosis not present

## 2021-01-31 DIAGNOSIS — E039 Hypothyroidism, unspecified: Secondary | ICD-10-CM | POA: Diagnosis not present

## 2021-01-31 DIAGNOSIS — N83201 Unspecified ovarian cyst, right side: Secondary | ICD-10-CM | POA: Diagnosis not present

## 2021-01-31 DIAGNOSIS — M8580 Other specified disorders of bone density and structure, unspecified site: Secondary | ICD-10-CM | POA: Diagnosis not present

## 2021-01-31 DIAGNOSIS — R7301 Impaired fasting glucose: Secondary | ICD-10-CM | POA: Diagnosis not present

## 2021-01-31 DIAGNOSIS — G3184 Mild cognitive impairment, so stated: Secondary | ICD-10-CM | POA: Diagnosis not present

## 2021-01-31 DIAGNOSIS — F320 Major depressive disorder, single episode, mild: Secondary | ICD-10-CM | POA: Diagnosis not present

## 2021-01-31 DIAGNOSIS — E785 Hyperlipidemia, unspecified: Secondary | ICD-10-CM | POA: Diagnosis not present

## 2021-01-31 DIAGNOSIS — D649 Anemia, unspecified: Secondary | ICD-10-CM | POA: Diagnosis not present

## 2021-04-18 ENCOUNTER — Encounter: Payer: Self-pay | Admitting: *Deleted

## 2021-04-18 ENCOUNTER — Emergency Department (HOSPITAL_BASED_OUTPATIENT_CLINIC_OR_DEPARTMENT_OTHER): Payer: Medicare HMO

## 2021-04-18 ENCOUNTER — Other Ambulatory Visit: Payer: Self-pay

## 2021-04-18 ENCOUNTER — Emergency Department (HOSPITAL_BASED_OUTPATIENT_CLINIC_OR_DEPARTMENT_OTHER)
Admission: EM | Admit: 2021-04-18 | Discharge: 2021-04-18 | Disposition: A | Discharge status: Home / Self Care | Payer: Medicare HMO | Attending: Emergency Medicine | Admitting: Emergency Medicine

## 2021-04-18 DIAGNOSIS — Z79899 Other long term (current) drug therapy: Secondary | ICD-10-CM | POA: Diagnosis not present

## 2021-04-18 DIAGNOSIS — E039 Hypothyroidism, unspecified: Secondary | ICD-10-CM | POA: Insufficient documentation

## 2021-04-18 DIAGNOSIS — R531 Weakness: Secondary | ICD-10-CM | POA: Diagnosis not present

## 2021-04-18 DIAGNOSIS — I10 Essential (primary) hypertension: Secondary | ICD-10-CM | POA: Diagnosis not present

## 2021-04-18 DIAGNOSIS — J449 Chronic obstructive pulmonary disease, unspecified: Secondary | ICD-10-CM | POA: Insufficient documentation

## 2021-04-18 DIAGNOSIS — F17210 Nicotine dependence, cigarettes, uncomplicated: Secondary | ICD-10-CM | POA: Insufficient documentation

## 2021-04-18 LAB — CBC WITH DIFFERENTIAL/PLATELET
Abs Immature Granulocytes: 0.04 10*3/uL (ref 0.00–0.07)
Basophils Absolute: 0 10*3/uL (ref 0.0–0.1)
Basophils Relative: 0 %
Eosinophils Absolute: 0.1 10*3/uL (ref 0.0–0.5)
Eosinophils Relative: 1 %
HCT: 44.7 % (ref 36.0–46.0)
Hemoglobin: 15.3 g/dL — ABNORMAL HIGH (ref 12.0–15.0)
Immature Granulocytes: 0 %
Lymphocytes Relative: 17 %
Lymphs Abs: 1.8 10*3/uL (ref 0.7–4.0)
MCH: 31.9 pg (ref 26.0–34.0)
MCHC: 34.2 g/dL (ref 30.0–36.0)
MCV: 93.3 fL (ref 80.0–100.0)
Monocytes Absolute: 0.9 10*3/uL (ref 0.1–1.0)
Monocytes Relative: 9 %
Neutro Abs: 7.4 10*3/uL (ref 1.7–7.7)
Neutrophils Relative %: 73 %
Platelets: 262 10*3/uL (ref 150–400)
RBC: 4.79 MIL/uL (ref 3.87–5.11)
RDW: 12.1 % (ref 11.5–15.5)
WBC: 10.3 10*3/uL (ref 4.0–10.5)
nRBC: 0 % (ref 0.0–0.2)

## 2021-04-18 LAB — COMPREHENSIVE METABOLIC PANEL
ALT: 7 U/L (ref 0–44)
AST: 11 U/L — ABNORMAL LOW (ref 15–41)
Albumin: 3.9 g/dL (ref 3.5–5.0)
Alkaline Phosphatase: 51 U/L (ref 38–126)
Anion gap: 10 (ref 5–15)
BUN: 19 mg/dL (ref 8–23)
CO2: 26 mmol/L (ref 22–32)
Calcium: 9.2 mg/dL (ref 8.9–10.3)
Chloride: 106 mmol/L (ref 98–111)
Creatinine, Ser: 0.69 mg/dL (ref 0.44–1.00)
GFR, Estimated: >60 mL/min (ref >60)
Glucose, Bld: 103 mg/dL — ABNORMAL HIGH (ref 70–99)
Potassium: 3.5 mmol/L (ref 3.5–5.1)
Sodium: 142 mmol/L (ref 135–145)
Total Bilirubin: 0.7 mg/dL (ref 0.3–1.2)
Total Protein: 6.5 g/dL (ref 6.5–8.1)

## 2021-04-18 LAB — URINALYSIS, ROUTINE W REFLEX MICROSCOPIC
Bilirubin Urine: NEGATIVE
Glucose, UA: NEGATIVE mg/dL
Hgb urine dipstick: NEGATIVE
Ketones, ur: NEGATIVE mg/dL
Leukocytes,Ua: NEGATIVE
Nitrite: NEGATIVE
Specific Gravity, Urine: 1.027 (ref 1.005–1.030)
pH: 6 (ref 5.0–8.0)

## 2021-04-18 LAB — TROPONIN I (HIGH SENSITIVITY)
Troponin I (High Sensitivity): 19 ng/L — ABNORMAL HIGH (ref <18)
Troponin I (High Sensitivity): 18 ng/L — ABNORMAL HIGH (ref <18)

## 2021-04-18 MED ORDER — LOSARTAN POTASSIUM 25 MG PO TABS
50.0000 mg | ORAL_TABLET | Freq: Once | ORAL | Status: AC
  Administered 2021-04-18: 50 mg via ORAL
  Filled 2021-04-18: qty 2

## 2021-04-18 MED ORDER — LOSARTAN POTASSIUM 50 MG PO TABS: 50.0000 mg | ORAL_TABLET | Freq: Every day | ORAL | 0 refills | Status: DC

## 2021-04-18 MED ORDER — SODIUM CHLORIDE 0.9 % IV BOLUS
1000.0000 mL | Freq: Once | INTRAVENOUS | Status: AC
  Administered 2021-04-18: 1000 mL via INTRAVENOUS

## 2021-04-18 NOTE — ED Triage Notes (Signed)
Sent by the facility,  [facility], nurse for ongoing weakness x 5 days ago, yet feeling better today. No symptoms today . Nurse requesting checking for UTI.

## 2021-04-18 NOTE — ED Provider Notes (Signed)
Plans to follow-up on troponin.  If troponins are flat anticipate discharge.  Patient has had generalized weakness that has improved today.  Patient and her husband reports that for several days off-and-on she has been very weak and had difficulty doing usual activities and getting up from her chair.  Today symptoms were improved.  She was able to ambulate to the car and into the hospital.  Patient blood pressure is noted to be significantly elevated.  Dr. Melina Copa had ordered 1 dose of Cozaar.  The patient apparently has actually been off of her blood pressure medications for some time.  She does not have any headache or visual changes.  No chest pain. Physical Exam  BP (!) 184/115   Pulse 72   Temp 98.6 F (37 C) (Oral)   Resp 19   Ht 5\' 4"  (1.626 m)   Wt 59 kg   SpO2 94%   BMI 22.31 kg/m   Physical Exam Constitutional:      Comments: Alert nontoxic.  Answering questions appropriately.  No acute distress.  No respiratory distress  HENT:     Mouth/Throat:     Pharynx: Oropharynx is clear.  Eyes:     Extraocular Movements: Extraocular movements intact.  Pulmonary:     Effort: Pulmonary effort is normal.  Neurological:     General: No focal deficit present.  Psychiatric:        Mood and Affect: Mood normal.     ED Course/Procedures     Procedures  MDM  Troponins are flat.  No history of chest pain.  Other diagnostic evaluation is within normal limits.  Patient does have significant hypertension.  It appears this is been untreated for possibly weeks to months or more.  Currently no signs of endorgan damage.  Will restart Cozaar.  Patient is being seen by neurology tomorrow in follow-up.  Have counseled on monitoring her blood pressures at home and close monitoring of her response to the Cozaar.  Return precautions reviewed.       Charlesetta Shanks, MD 04/18/21 [phone]

## 2021-04-18 NOTE — ED Notes (Signed)
Please note that I spoke with staff at her PCP office per her husbands request and they advised that while pt is not currently on any BP meds she was last prescribed atenolol/chlorthialidone 50/25mg  back in 2018, they can not tell me until when she took this.   Previous to that she was on Cozaar 50mg  1 po daily which was last prescribed in 2014.

## 2021-04-18 NOTE — ED Provider Notes (Signed)
[facility] DEPT Provider Note   CSN: [id number] Arrival date & time: 04/18/21  1138     History No chief complaint on file.   [name] is a 86 y.o. female.  She is brought in by her caregiver/husband who is providing much of the history.  She lives at [place] and for the last 5 or 10 days she has been weak.  They saw the nurse and she felt that the patient needed to be checked in case she has a UTI.  She wears depends and is incontinent of urine and stool at baseline.  Patient herself denies any complaints.  Her husband says she had a large abdominal surgery years ago and they took out her gallbladder and a diverticular abscess and has taken a long time to recover.  The history is provided by the patient and a caregiver.  Weakness Severity:  Moderate Onset quality:  Gradual Timing:  Intermittent Progression:  Improving Chronicity:  New Context: not recent infection   Relieved by:  Nothing Worsened by:  Activity Ineffective treatments:  None tried Associated symptoms: no abdominal pain, no chest pain, no cough, no falls, no fever, no foul-smelling urine, no frequency, no headaches, no nausea, no shortness of breath, no syncope and no vomiting        Past Medical History:  Diagnosis Date  . Anemia   . Bowel obstruction ([place])   . Calculus of gallbladder with acute cholecystitis, without mention of obstruction 10/01/2013   Open chole on 10/26/13. 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 COPD (chronic obstructive pulmonary disease) ([place])   . Depression    "sold beach house 01/2012" (10/25/2013)  . H/O sebaceous cyst   . Hepatic cyst 10/01/2013   CT  . History of diverticular abscess 09/2013  . Hyperlipemia   . Hypertension    "was taking RX; took me off it 09/2013" (10/25/2013)  . Hypothyroidism    goiter  . Insomnia   . Osteopenia   . Protein calorie malnutrition (HCC)     malabsorption  . Seasonal allergies   . Vitamin B12 deficiency     Patient Active Problem List   Diagnosis Date Noted  . Infected sebaceous cyst 01/13/2014  . Unspecified hypothyroidism 12/27/2013  . Essential hypertension, benign 12/27/2013  . Post-operative state 11/29/2013  . Cellulitis of flank 11/08/2013  . Severe protein-calorie malnutrition ([place]) 10/09/2013    Class: Chronic  . Tobacco use disorder 10/09/2013    Class: Chronic  . Diarrhea 10/09/2013    Class: Acute  . Pericolonic abscess due to diverticulitis 10/06/2013  . Diverticular disease small and large intestine, perforation, abscess 10/01/2013  . Calculus of gallbladder with acute cholecystitis, without mention of obstruction 10/01/2013  . Loss of weight 10/01/2013  . Goiter 10/01/2013    Past Surgical History:  Procedure Laterality Date  . ABCESS DRAINAGE  2014   "had drain put in & stayed for ~ 1 wk to 10 day; just came out ~ 10 days ago" (10/25/2013)  . CATARACT EXTRACTION W/ INTRAOCULAR LENS IMPLANT Left ~ 2010  . CHOLECYSTECTOMY N/A 10/26/2013   Procedure: ATTEMPTED  LAPAROSCOPIC CONVERTED TO OPEN CHOLECYSTECTOMY WITH INTRAOPERATIVE CHOLANGIOGRAM; REPAIR OF [place] FISTULA; cholecystoduodenal fistula repair; lysis of adhesions; drainage of pelvic abscess; omental patching of duodenal repair;  Surgeon: Joyice Faster. Cornett, MD;  Location: [facility];  Service: General;  Laterality: N/A;  . COLON RESECTION    . DILATION AND CURETTAGE OF UTERUS    .  EXCISION MASS NECK Left 10/31/2016   Procedure: EXCISION CYST LEFT NECK;  Surgeon: Erroll Luna, MD;  Location: [facility];  Service: General;  Laterality: Left;  EXCISION CYST LEFT NECK     OB History    Gravida  3   Para  3   Term      Preterm      AB      Living  3     SAB      IAB      Ectopic      Multiple      Live Births              Family History  Problem Relation Age of Onset  . Prostate cancer Father      Social History   Tobacco Use  . Smoking status: Current Every Day Smoker    Packs/day: 0.25    Years: 55.00    Pack years: 13.75    Types: Cigarettes  . Smokeless tobacco: Never Used  Vaping Use  . Vaping Use: Never used  Substance Use Topics  . Alcohol use: Not Currently  . Drug use: No    Home Medications Prior to Admission medications   Medication Sig Start Date End Date Taking? Authorizing Provider  cholecalciferol (VITAMIN D) 1000 UNITS tablet Take 1,000 Units by mouth daily.    [provider]  Cyanocobalamin (VITAMIN B 12 PO) Take by mouth.    [provider]  MELATONIN PO Take by mouth.    [provider]  mirabegron ER (MYRBETRIQ) 25 MG TB24 tablet Take 25 mg by mouth daily.    [provider]  Probiotic Product (PROBIOTIC PO) Take by mouth.    [provider]  rosuvastatin (CRESTOR) 10 MG tablet Take 10 mg by mouth daily.    [provider]    Allergies    Ambien [zolpidem tartrate], Cymbalta [duloxetine hcl], and Halcion [triazolam]  Review of Systems   Review of Systems  Constitutional: Negative for fever.  HENT: Negative for sore throat.   Eyes: Negative for visual disturbance.  Respiratory: Negative for cough and shortness of breath.   Cardiovascular: Negative for chest pain and syncope.  Gastrointestinal: Negative for abdominal pain, nausea and vomiting.  Genitourinary: Negative for frequency.  Musculoskeletal: Positive for gait problem. Negative for falls.  Skin: Negative for rash.  Neurological: Positive for weakness. Negative for headaches.    Physical Exam Updated Vital Signs BP (!) 179/95   Pulse 65   Temp 98.6 F (37 C) (Oral)   Resp 16   Ht 5\' 4"  (1.626 m)   Wt 59 kg   SpO2 95%   BMI 22.31 kg/m   Physical Exam Vitals and nursing note reviewed.  Constitutional:      General: She is not in acute distress.    Appearance: Normal appearance. She is well-developed and underweight.   HENT:     Head: Normocephalic and atraumatic.  Eyes:     Conjunctiva/sclera: Conjunctivae normal.  Cardiovascular:     Rate and Rhythm: Normal rate and regular rhythm.     Heart sounds: No murmur heard.   Pulmonary:     Effort: Pulmonary effort is normal. No respiratory distress.     Breath sounds: Normal breath sounds.  Abdominal:     Palpations: Abdomen is soft.     Tenderness: There is no abdominal tenderness. There is no guarding or rebound.  Musculoskeletal:        General:  No deformity or signs of injury. Normal range of motion.     Cervical back: Neck supple.  Skin:    General: Skin is warm and dry.     Capillary Refill: Capillary refill takes less than 2 seconds.  Neurological:     General: No focal deficit present.     Mental Status: She is alert. Mental status is at baseline.     ED Results / Procedures / Treatments   Labs (all labs ordered are listed, but only abnormal results are displayed) Labs Reviewed  COMPREHENSIVE METABOLIC PANEL - Abnormal; Notable for the following components:      Result Value   Glucose, Bld 103 (*)    AST 11 (*)    All other components within normal limits  CBC WITH DIFFERENTIAL/PLATELET - Abnormal; Notable for the following components:   Hemoglobin 15.3 (*)    All other components within normal limits  URINALYSIS, ROUTINE W REFLEX MICROSCOPIC - Abnormal; Notable for the following components:   Protein, ur TRACE (*)    All other components within normal limits  TROPONIN I (HIGH SENSITIVITY) - Abnormal; Notable for the following components:   Troponin I (High Sensitivity) 19 (*)    All other components within normal limits  TROPONIN I (HIGH SENSITIVITY) - Abnormal; Notable for the following components:   Troponin I (High Sensitivity) 18 (*)    All other components within normal limits  URINE CULTURE    EKG EKG Interpretation  Date/Time:  Wednesday Apr 18 2021 13:44:41 EDT Ventricular Rate:  71 PR Interval:  55 QRS  Duration: 83 QT Interval:  476 QTC Calculation: 550 R Axis:   -57 Text Interpretation: Sinus rhythm Probable left atrial enlargement Abnormal R-wave progression, early transition Inferior infarct, old Consider anterior infarct Lateral leads are also involved Prolonged QT interval No significant change since prior 11/17 Confirmed by Aletta Edouard [phone]) on 04/18/2021 3:39:26 PM   Radiology No results found.  Procedures Procedures   Medications Ordered in ED Medications  sodium chloride 0.9 % bolus 1,000 mL (0 mLs Intravenous Stopped 04/18/21 1525)  losartan (COZAAR) tablet 50 mg (50 mg Oral Given 04/18/21 1540)    ED Course  I have reviewed the triage vital signs and the nursing notes.  Pertinent labs & imaging results that were available during my care of the patient were reviewed by me and considered in my medical decision making (see chart for details).    MDM Rules/Calculators/A&P                         This patient complains of generalized weakness over the last few days although improved today; this involves an extensive number of treatment Options and is a complaint that carries with it a high risk of complications and Morbidity. The differential includes UTI, metabolic derangement, anemia, ACS, hypertensive urgency  I ordered, reviewed and interpreted labs, which included CBC with normal white count normal hemoglobin, chemistries normal, urinalysis without signs of infection, troponin mildly elevated but flat I ordered medication IV fluids and oral losartan  Additional history obtained from patient's husband Previous records obtained and reviewed in epic, no recent admissions  After the interventions stated above, I reevaluated the patient and found patient to be essentially asymptomatic.  Blood pressure is quite high.  It looks like she was on blood pressure medicine in 2014.  Unclear when it was stopped.  Patient's care is signed out on her provider to follow-up on delta  troponin and if she has any improvement with her blood pressure on oral medication.  I have sent a prescription for losartan to the pharmacy.  Recommended close follow-up with PCP to her and her husband.   Final Clinical Impression(s) / ED Diagnoses Final diagnoses:  Generalized weakness  Hypertension, unspecified type    Rx / DC Orders ED Discharge Orders         Ordered    losartan (COZAAR) 50 MG tablet  Daily        04/18/21 1508           Hayden Rasmussen, MD 04/18/21 1645

## 2021-04-18 NOTE — ED Notes (Signed)
Manual BP taken in both arms. Manual left BP 202/90 and Manual right BP 190/86. Informed Dr. Johnney Killian.

## 2021-04-18 NOTE — Discharge Instructions (Addendum)
You were seen in the emergency department for some weakness and concern for urinary tract infection.  Your lab work did not show any evidence of a urinary tract infection.  Your blood pressure is high here and we are restarting you back on your blood pressure medication.  Please contact your doctor for close follow-up and recheck of your blood pressure.  Return if any worsening or concerning symptoms  1.  Monitor your blood pressures at home.  Write them down in a journal. 2.  You have been given a dose of Cozaar 100 mg in the emergency department.  Cozaar can be taken in doses of 25 mg, 50 mg and 100 mg.  If your blood pressure remains greater than 160/90, continue taking Cozaar 100 mg daily.  If your blood pressure is less than 160/90, take Cozaar 50 mg daily.  See your doctor as soon as possible to help guide you in your blood pressure medication and regimen. 3.  See your neurologist tomorrow as scheduled. 4.  Return to the emergency department immediately if you have any recurrence of significant weakness that is preventing normal activities, if you develop any isolated weakness of arms, legs or problems with your vision or speech or unusual confusion.  Please review the attached instructions regarding symptoms of a stroke.  If you think you are having stroke symptoms, return to the emergency department immediately because early treatment is much more effective.

## 2021-04-19 ENCOUNTER — Ambulatory Visit: Payer: Medicare HMO | Admitting: Neurology

## 2021-04-19 ENCOUNTER — Encounter: Payer: Self-pay | Admitting: Neurology

## 2021-04-19 ENCOUNTER — Telehealth: Payer: Self-pay | Admitting: Neurology

## 2021-04-19 VITALS — BP 138/79 | HR 79 | Ht 64.0 in | Wt 126.0 lb

## 2021-04-19 DIAGNOSIS — R7989 Other specified abnormal findings of blood chemistry: Secondary | ICD-10-CM | POA: Diagnosis not present

## 2021-04-19 DIAGNOSIS — R269 Unspecified abnormalities of gait and mobility: Secondary | ICD-10-CM | POA: Insufficient documentation

## 2021-04-19 DIAGNOSIS — E538 Deficiency of other specified B group vitamins: Secondary | ICD-10-CM | POA: Diagnosis not present

## 2021-04-19 DIAGNOSIS — E079 Disorder of thyroid, unspecified: Secondary | ICD-10-CM | POA: Diagnosis not present

## 2021-04-19 DIAGNOSIS — R799 Abnormal finding of blood chemistry, unspecified: Secondary | ICD-10-CM | POA: Diagnosis not present

## 2021-04-19 DIAGNOSIS — R413 Other amnesia: Secondary | ICD-10-CM | POA: Diagnosis not present

## 2021-04-19 DIAGNOSIS — E559 Vitamin D deficiency, unspecified: Secondary | ICD-10-CM | POA: Diagnosis not present

## 2021-04-19 DIAGNOSIS — D748 Other methemoglobinemias: Secondary | ICD-10-CM | POA: Diagnosis not present

## 2021-04-19 LAB — URINE CULTURE: Culture: NO GROWTH

## 2021-04-19 MED ORDER — CARBIDOPA-LEVODOPA 25-100 MG PO TABS: 1.0000 | ORAL_TABLET | Freq: Three times a day (TID) | ORAL | 2 refills | Status: DC

## 2021-04-19 NOTE — Telephone Encounter (Signed)
Pt's daughter(on DPR) is asking to be called if any way pt can have her f/u on or before 05-23.  Pt's appointment is on wait list and the daughter is aware that Dr Krista Blue asked the scans be scheduled soon

## 2021-04-19 NOTE — Progress Notes (Signed)
Chief Complaint  Patient presents with  . Mild cognitive Impairment    Rm 16 New Pt  husband- [name], dgtrJniyah Dantuono  [facility]  12      ASSESSMENT AND PLAN  [name] is a 86 y.o. female  Acute worsening memory loss, gait abnormality, urinary and bowel incontinence since April 20s 2022  On examination, she has magnetic gait, difficulty initiate gait, small stride, leaning forward, hyperreflexia of bilateral lower extremity  MRI of brain, cervical spine to rule out structural abnormality, differentiation diagnosis include normal pressure hydrocephalus, cervical spondylitic myelopathy  Laboratory evaluations for treatable etiology , History of abdomen abscess, requiring extensive abdominal surgery in December 2014, severe protein calorie malnutrition, required prolonged TPN, significant decline since then,      DIAGNOSTIC DATA (LABS, IMAGING, TESTING) - I reviewed patient records, labs, notes, testing and imaging myself where available.   HISTORICAL  [name] is a 86 year old female, seen in request by her primary care physician Dr.   Forde Dandy, Annie Main, for evaluation of sudden worsening of memory loss, gait abnormality, she is accompanied by her daughter and husband at today's visit on 02/17/2021.  I reviewed and summarized the referring note. Patient had extensive abdominal surgery in 2014, due to chronic acute on chronic cholecystitis with gangrenous gallbladder, fistula to duodenum from gallbladder, fistula to transverse colon from gallbladder, small bowel obstruction from pelvic adhesive secondary to previous diverticulitis and abscess, she underwent laparoscopic converted to open cholecystectomy, repair of cholecystoduodenal fistula, cholecystocolonic fistula, drainage of residual pelvic abscess, omental patching of duodenum and colonic repair,  She had prolonged postoperative ileus, was kept on TPN due to low p.o. intake, also had a significant postoperative pain, required  polypharmacy treatment at that time,  Patient was discharged to nursing home following hospital admission, initially had significant memory loss, poor functional status, gradually recovered over the years, but was never able to bounce back to her presurgical level, had occasional bowel and bladder incontinence,  Before recent acute worsening, she was able to play bridges, walk without assistant, carry on simple conversations, family did reported that she has a long history of excessive alcohol use, can drink up to 2 bottles of wine in 1 day  Around April 20s,  she was noted to have acute worsening, had increased bowel and bladder, profound gait abnormality, worsening memory loss, husband reported that her symptoms happened overnight, but daughter reported that she already has a slow decline baseline  She was taken to emergency room on Apr 18 2021, laboratory evaluation showed no evidence of UTI, borderline troponin, normal WBC, hemoglobin of 15.3, CMP was normal, creatinine of 0.69   REVIEW OF SYSTEMS:  Full 14 system review of systems performed and notable only for as above All other review of systems were negative.  PHYSICAL EXAM:   Vitals:   04/19/21 1413  BP: 138/79  Pulse: 79  Weight: 126 lb (57.2 kg)  Height: 5\' 4"  (1.626 m)   Not recorded     Body mass index is 21.63 kg/m.  PHYSICAL EXAMNIATION:  Gen: NAD, conversant, well nourised, well groomed                     Cardiovascular: Regular rate rhythm, no peripheral edema, warm, nontender. Eyes: Conjunctivae clear without exudates or hemorrhage Neck: Supple, no carotid bruits. Pulmonary: Clear to auscultation bilaterally   NEUROLOGICAL EXAM:  MENTAL STATUS: Speech:    Speech is normal; cooperative on examination, quiet, dependent on her family  to provide medical history,  Montreal Cognitive Assessment  04/19/2021  Visuospatial/ Executive (0/5) 1  Naming (0/3) 3  Attention: Read list of digits (0/2) 1  Attention:  Read list of letters (0/1) 1  Attention: Serial 7 subtraction starting at 100 (0/3) 1  Language: Repeat phrase (0/2) 2  Language : Fluency (0/1) 0  Abstraction (0/2) 2  Delayed Recall (0/5) 0  Orientation (0/6) 1  Total 12     CRANIAL NERVES: CN II: Visual fields are full to confrontation. Pupils are round equal and briskly reactive to light. CN III, IV, VI: extraocular movement are normal. No ptosis. CN V: Facial sensation is intact to light touch CN VII: Face is symmetric with normal eye closure  CN VIII: Hearing is normal to causal conversation. CN IX, X: Phonation is normal. CN XI: Head turning and shoulder shrug are intact  MOTOR: Moves bilateral upper and lower extremities without significant difficulty, motor strength was felt to be normal proximally distally, mild right more than left rigidity, bradykinesia  REFLEXES: Reflexes are 2+ and symmetric at the biceps, triceps, 3/3 knees, and trace at ankles. Plantar responses are extensor bilaterally.  SENSORY: Intact to light touch, pinprick and vibratory sensation are intact in fingers and toes.  COORDINATION: There is no trunk or limb dysmetria noted.  GAIT/STANCE: She needs push-up to get up from seated position, difficulty initiate gait, small stride, very unsteady, leaning forward  ALLERGIES: Allergies  Allergen Reactions  . Ambien [Zolpidem Tartrate] Other (See Comments)    Makes her unsteady  . Cymbalta [Duloxetine Hcl] Other (See Comments)    "kinda went nuts"  . Halcion [Triazolam] Other (See Comments)    "kinda went nuts"    HOME MEDICATIONS: Current Outpatient Medications  Medication Sig Dispense Refill  . Ascorbic Acid (VITAMIN C) 500 MG CAPS See admin instructions.    . cholecalciferol (VITAMIN D) 1000 UNITS tablet Take 1,000 Units by mouth daily.    . Cyanocobalamin (VITAMIN B 12 PO) Take by mouth.    . losartan (COZAAR) 50 MG tablet Take 1 tablet (50 mg total) by mouth daily. 30 tablet 0  .  MELATONIN PO Take by mouth.    . mirabegron ER (MYRBETRIQ) 25 MG TB24 tablet Take 25 mg by mouth daily.    . Probiotic Product (PROBIOTIC PO) Take by mouth.    . rosuvastatin (CRESTOR) 10 MG tablet Take 10 mg by mouth daily.     No current facility-administered medications for this visit.    PAST MEDICAL HISTORY: Past Medical History:  Diagnosis Date  . Anemia   . Bowel obstruction ([place])   . Calculus of gallbladder with acute cholecystitis, without mention of obstruction 10/01/2013   Open chole on 10/26/13. 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 COPD (chronic obstructive pulmonary disease) ([place])   . Depression    "sold beach house 01/2012" (10/25/2013)  . Gall stones   . Goiter   . H/O sebaceous cyst   . Hepatic cyst 10/01/2013   CT  . History of diverticular abscess 09/2013  . Hyperlipemia   . Hypertension    "was taking RX; took me off it 09/2013" (10/25/2013)  . Hypothyroidism    goiter  . Insomnia   . MCI (mild cognitive impairment)   . Osteopenia   . Protein calorie malnutrition (HCC)    malabsorption  . Seasonal allergies   . Vitamin B12 deficiency     PAST SURGICAL HISTORY: Past Surgical History:  Procedure Laterality Date  . ABCESS DRAINAGE  2014   "had drain put in & stayed for ~ 1 wk to 10 day; just came out ~ 10 days ago" (10/25/2013)  . CATARACT EXTRACTION W/ INTRAOCULAR LENS IMPLANT Left ~ 2010  . CHOLECYSTECTOMY N/A 10/26/2013   Procedure: ATTEMPTED  LAPAROSCOPIC CONVERTED TO OPEN CHOLECYSTECTOMY WITH INTRAOPERATIVE CHOLANGIOGRAM; REPAIR OF [place] FISTULA; cholecystoduodenal fistula repair; lysis of adhesions; drainage of pelvic abscess; omental patching of duodenal repair;  Surgeon: Joyice Faster. Cornett, MD;  Location: [facility];  Service: General;  Laterality: N/A;  . COLON RESECTION  2014  . DILATION AND CURETTAGE OF UTERUS    . EXCISION MASS NECK Left 10/31/2016   Procedure: EXCISION CYST  LEFT NECK;  Surgeon: Erroll Luna, MD;  Location: [facility];  Service: General;  Laterality: Left;  EXCISION CYST LEFT NECK    FAMILY HISTORY: Family History  Problem Relation Age of Onset  . Prostate cancer Father     SOCIAL HISTORY: Social History   Socioeconomic History  . Marital status: Married    Spouse name: [name]  . Number of children: 3  . Years of education: Not on file  . Highest education level: Bachelor's degree (e.g., BA, AB, BS)  Occupational History  . Not on file  Tobacco Use  . Smoking status: Former Smoker    Packs/day: 0.25    Years: 55.00    Pack years: 13.75    Types: Cigarettes    Quit date: 01/20/2021    Years since quitting: 0.2  . Smokeless tobacco: Never Used  Vaping Use  . Vaping Use: Never used  Substance and Sexual Activity  . Alcohol use: Not Currently  . Drug use: No  . Sexual activity: Not Currently    Birth control/protection: Post-menopausal  Other Topics Concern  . Not on file  Social History Narrative   04/19/21 Lives with husband   Social Determinants of Health   Financial Resource Strain: Not on file  Food Insecurity: Not on file  Transportation Needs: Not on file  Physical Activity: Not on file  Stress: Not on file  Social Connections: Not on file  Intimate Partner Violence: Not on file     Total time spent reviewing the chart, obtaining history, examined patient, ordering tests, documentation, consultations and family, care coordination was 16 minutes   Marcial Pacas, M.D. Ph.D.  [facility] [address] [place], [place] [address] Ph: [phone] Fax: [phone]  CC:  Reynold Bowen, MD [place],  [place] [id number]  Reynold Bowen, MD

## 2021-04-19 NOTE — Telephone Encounter (Signed)
Please try to get MRI brain/cervical soon.

## 2021-04-20 LAB — THYROID PANEL WITH TSH
Free Thyroxine Index: 2.2 (ref 1.2–4.9)
T3 Uptake Ratio: 24 % (ref 24–39)
T4, Total: 9.2 ug/dL (ref 4.5–12.0)
TSH: 3.17 u[IU]/mL (ref 0.450–4.500)

## 2021-04-20 LAB — VITAMIN B12: Vitamin B-12: 1894 pg/mL — ABNORMAL HIGH (ref 232–1245)

## 2021-04-20 LAB — VITAMIN D 25 HYDROXY (VIT D DEFICIENCY, FRACTURES): Vit D, 25-Hydroxy: 42.7 ng/mL (ref 30.0–100.0)

## 2021-04-20 LAB — CK: Total CK: 77 U/L (ref 26–161)

## 2021-04-20 NOTE — Telephone Encounter (Signed)
I called patient's daughter x 2. No answer. If symptoms have significantly worsened, then agree patient should go to ER to get eval and imaging completed. -VRP

## 2021-04-20 NOTE — Telephone Encounter (Signed)
Pt's daughter(on DPR) is asking for a call from the On Call Dr to discuss how pt just from yesterday is now not walking and she wants to know if pt should be taken to the ED.  Please call [name],[name] at [phone]

## 2021-04-23 ENCOUNTER — Telehealth: Payer: Self-pay

## 2021-04-23 ENCOUNTER — Telehealth: Payer: Self-pay | Admitting: *Deleted

## 2021-04-23 NOTE — Telephone Encounter (Signed)
Mcarthur Rossetti Josem Kaufmann: [phone] (exp. 04/23/21 to 05/23/21). I sent [name] the manger at GI to reach out to the patient as soon as possible.

## 2021-04-23 NOTE — Telephone Encounter (Signed)
Pt's daughter, Auset Fritzler, called, will need an appt Thursday 04/26/21 to go over the results. Would like a call from the nurse

## 2021-04-23 NOTE — Telephone Encounter (Signed)
LVM for daughter about lab results per AA,MD note. Advised her to call back if she has further questions.

## 2021-04-23 NOTE — Telephone Encounter (Signed)
Patient's daughter, Gitel, per DPR left a voicemail asking for a call to get patient seen by Dr. Krista Blue this Thursday or Friday.  Patient's MRI is scheduled for tomorrow.  Patient's daughter can be reached at [phone].

## 2021-04-23 NOTE — Telephone Encounter (Signed)
Patient MRI's are scheduled at [facility] for 04/24/21.

## 2021-04-23 NOTE — Telephone Encounter (Signed)
Pt's daughter, Ally Knodel (on [place]) called, she is  Stabilized now. When can you schedule her MRI. Would like a call from the nurse.

## 2021-04-23 NOTE — Telephone Encounter (Signed)
-----   Message from Melvenia Beam, MD sent at 04/23/2021  4:36 PM EDT ----- So far everything looks normal, thanks

## 2021-04-23 NOTE — Telephone Encounter (Signed)
Mordecai Rasmussen   EE   7:55 AM Note [employer] pending uploaded notes on the portal    They would like MRI done at [facility]     Per note from MRI coordinator. Auth still pending will be scheduled once completed.

## 2021-04-23 NOTE — Telephone Encounter (Signed)
[employer] pending uploaded notes on the portal   They would like MRI done at [facility]

## 2021-04-23 NOTE — Telephone Encounter (Signed)
Unsure if we will have the results of the MRI by Thursday.  We will need to check with Dr. Krista Blue when she returns to the office. I called the daughter to let her know and LVM with office number.   If the daughter calls back, please let her know that we are not sure if the results will be completely ready by Thursday and I will need to check with Dr. Krista Blue first. She will be in the office tomorrow.

## 2021-04-23 NOTE — Telephone Encounter (Signed)
I called the pt's daughter and attempted to discuss this message. She reported she was on a board call for work and would have to CB once the call was over. I advised she could call back.  Will wait to hear back from the the pt's daughter.

## 2021-04-24 ENCOUNTER — Ambulatory Visit
Admission: RE | Admit: 2021-04-24 | Discharge: 2021-04-24 | Disposition: A | Discharge status: Home / Self Care | Payer: Medicare HMO | Source: Ambulatory Visit | Attending: Neurology | Admitting: Neurology

## 2021-04-24 ENCOUNTER — Other Ambulatory Visit: Payer: Self-pay

## 2021-04-24 DIAGNOSIS — R413 Other amnesia: Secondary | ICD-10-CM | POA: Diagnosis not present

## 2021-04-24 DIAGNOSIS — R269 Unspecified abnormalities of gait and mobility: Secondary | ICD-10-CM | POA: Diagnosis not present

## 2021-04-24 NOTE — Telephone Encounter (Signed)
Spoke with patient's daughter and scheduled pt for this Thursday at 1:00 pm with Dr Krista Blue. Pt's daughter verbalized appreciation.   I canceled the appt for 5/26 since pt is being seen sooner.

## 2021-04-24 NOTE — Telephone Encounter (Signed)
Make sure that patient in on schedule for MRIs ordered.

## 2021-04-24 NOTE — Telephone Encounter (Signed)
Patient MRI's are scheduled for today at [facility].

## 2021-04-24 NOTE — Telephone Encounter (Signed)
Spoke with Dr Rhea [facility] nurse. Ok to schedule pt. Dr Krista Blue has an availability Thursday 5/12 at 10:30 AM.  I called the patient's daughter, [name], and LVM on previous appointment time.  Asked for call back to confirm so we can schedule he  Left office number in message.   When the daughter calls back, please offer this Thursday at 1030 with Dr. Krista Blue.  If she accepts please cancel the May 26 appointment since we will be seeing her earlier.

## 2021-04-26 ENCOUNTER — Encounter: Payer: Self-pay | Admitting: Neurology

## 2021-04-26 ENCOUNTER — Other Ambulatory Visit: Payer: Self-pay

## 2021-04-26 ENCOUNTER — Ambulatory Visit: Payer: Medicare HMO | Admitting: Neurology

## 2021-04-26 VITALS — BP 191/92 | HR 85 | Ht 64.0 in | Wt 116.0 lb

## 2021-04-26 DIAGNOSIS — R269 Unspecified abnormalities of gait and mobility: Secondary | ICD-10-CM | POA: Diagnosis not present

## 2021-04-26 DIAGNOSIS — F0390 Unspecified dementia without behavioral disturbance: Secondary | ICD-10-CM

## 2021-04-26 NOTE — Progress Notes (Signed)
Chief Complaint  Patient presents with  . Follow-up    She is here with her daughter, [name] and husband, [name]. They would like to review her brain and cervical MRI results.      ASSESSMENT AND PLAN  [name] is a 86 y.o. female  Dementia Gait abnormality  MRI of the brain showed significant generalized atrophy, ventriculomegaly, cervical spine showed multilevel degenerative changes,  Likely central nervous system degenerative disorder, with vascular component,  She has significant fluctuation of her gait abnormality, mentation,  She does has mild parkinsonian features, left more than right rigidity, bradykinesia, small stride, difficulty initiate gait,  Husband report improvement with Sinemet 25/100 mg 3 times a day but she was noted to have excessive sleepiness, advised him to cut back to half tablet 3 times a dayb encourage moderate exercise  History of abdomen abscess, requiring extensive abdominal surgery in December 2014, severe protein calorie malnutrition, required prolonged TPN, significant decline since then,      DIAGNOSTIC DATA (LABS, IMAGING, TESTING) - I reviewed patient records, labs, notes, testing and imaging myself where available.   MRI cervical spine without contrast on Apr 24, 2021: - At C3-4 disc bulging with mild spinal stenosis and mild biforaminal stenosis. - Degenerative spondylosis and disc bulging from C3-4, C4-5 and C5-6 levels.   MRI brain (without), 04/24/2021 -Moderate to severe atrophy and moderate ventriculomegaly on ex vacuo basis. -Moderate chronic small vessel ischemic disease. -No acute findings.   Laboratory evaluation showed normal B12, thyroid panel, vitamin D, HISTORICAL  [name] is a 86 year old female, seen in request by her primary care physician Dr.   Forde [name], [name], for evaluation of sudden worsening of memory loss, gait abnormality, she is accompanied by her daughter and husband at today's visit on  02/17/2021.  I reviewed and summarized the referring note. Patient had extensive abdominal surgery in 2014, due to chronic acute on chronic cholecystitis with gangrenous gallbladder, fistula to duodenum from gallbladder, fistula to transverse colon from gallbladder, small bowel obstruction from pelvic adhesive secondary to previous diverticulitis and abscess, she underwent laparoscopic converted to open cholecystectomy, repair of cholecystoduodenal fistula, cholecystocolonic fistula, drainage of residual pelvic abscess, omental patching of duodenum and colonic repair,  She had prolonged postoperative ileus, was kept on TPN due to low p.o. intake, also had a significant postoperative pain, required polypharmacy treatment at that time,  Patient was discharged to nursing home following hospital admission, initially had significant memory loss, poor functional status, gradually recovered over the years, but was never able to bounce back to her presurgical level, had occasional bowel and bladder incontinence,  Before recent acute worsening, she was able to play bridges, walk without assistant, carry on simple conversations, family did reported that she has a long history of excessive alcohol use, can drink up to 2 bottles of wine in 1 day  Around April 20s,  she was noted to have acute worsening, had increased bowel and bladder, profound gait abnormality, worsening memory loss, husband reported that her symptoms happened overnight, but daughter reported that she already has a slow decline baseline  She was taken to emergency room on Apr 18 2021, laboratory evaluation showed no evidence of UTI, borderline troponin, normal WBC, hemoglobin of 15.3, CMP was normal, creatinine of 0.69  Update Apr 26, 2021, She is accompanied by her husband and daughter at today's clinical visit, she was getting a trial of Sinemet 25/100 mg 3 times daily for couple days, husband reported significant improvement,  but daughter  reported that she sleeps so much after taking medication, has quit giving her medication  Patient overall has improved, she can walk better, is more cooperative, but continues to have significant memory loss, gait abnormality, bowel and bladder incontinence,  Laboratory evaluation showed normal B12 CPK, thyroid function, vitamin D  MRI of the brain reviewed with patient and her family: Significant generalized atrophy, ventriculomegaly, profound periventricular white matter disease, which likely explain her significant memory loss, gait abnormality, fluctuation of her functional status  MRI of cervical spine showed multilevel degenerative changes, no evidence of cord compression  REVIEW OF SYSTEMS:  Full 14 system review of systems performed and notable only for as above All other review of systems were negative.  PHYSICAL EXAM:   Vitals:   04/26/21 1316  BP: (!) 191/92  Pulse: 85  Weight: 116 lb (52.6 kg)  Height: 5\' 4"  (1.626 m)   Not recorded     Body mass index is 19.91 kg/m.  PHYSICAL EXAMNIATION:  Gen: NAD, conversant, well nourised, well groomed     NEUROLOGICAL EXAM:  MENTAL STATUS: Speech:    Speech is normal; cooperative on examination, quiet, dependent on her family to provide medical history,  Montreal Cognitive Assessment  04/19/2021  Visuospatial/ Executive (0/5) 1  Naming (0/3) 3  Attention: Read list of digits (0/2) 1  Attention: Read list of letters (0/1) 1  Attention: Serial 7 subtraction starting at 100 (0/3) 1  Language: Repeat phrase (0/2) 2  Language : Fluency (0/1) 0  Abstraction (0/2) 2  Delayed Recall (0/5) 0  Orientation (0/6) 1  Total 12     CRANIAL NERVES: CN II: Visual fields are full to confrontation. Pupils are round equal and briskly reactive to light. CN III, IV, VI: extraocular movement are normal. No ptosis. CN V: Facial sensation is intact to light touch CN VII: Face is symmetric with normal eye closure  CN VIII: Hearing is  normal to causal conversation. CN IX, X: Phonation is normal. CN XI: Head turning and shoulder shrug are intact  MOTOR: Moves bilateral upper and lower extremities without significant difficulty, motor strength was felt to be normal proximally distally, mild left more than right rigidity, bradykinesia  REFLEXES: Reflexes are 1 and symmetric at the biceps, triceps, 3/3 knees, and trace at ankles. Plantar responses are extensor bilaterally.  SENSORY: Intact to light touch, pinprick and vibratory sensation are intact in fingers and toes.  COORDINATION: There is no trunk or limb dysmetria noted.  GAIT/STANCE: She needs push-up to get up from seated position, small stride, cautious, unsteady  ALLERGIES: Allergies  Allergen Reactions  . Ambien [Zolpidem Tartrate] Other (See Comments)    Makes her unsteady  . Cymbalta [Duloxetine Hcl] Other (See Comments)    "kinda went nuts"  . Halcion [Triazolam] Other (See Comments)    "kinda went nuts"    HOME MEDICATIONS: Current Outpatient Medications  Medication Sig Dispense Refill  . Ascorbic Acid (VITAMIN C) 500 MG CAPS See admin instructions.    . carbidopa-levodopa (SINEMET IR) 25-100 MG tablet Take 1 tablet by mouth 3 (three) times daily. 90 tablet 2  . cholecalciferol (VITAMIN D) 1000 UNITS tablet Take 1,000 Units by mouth daily.    . Cyanocobalamin (VITAMIN B 12 PO) Take by mouth.    . losartan (COZAAR) 50 MG tablet Take 1 tablet (50 mg total) by mouth daily. 30 tablet 0  . MELATONIN PO Take by mouth.    . mirabegron ER (MYRBETRIQ) 25 MG  TB24 tablet Take 25 mg by mouth daily.    . Probiotic Product (PROBIOTIC PO) Take by mouth.    . rosuvastatin (CRESTOR) 10 MG tablet Take 10 mg by mouth daily.     No current facility-administered medications for this visit.    PAST MEDICAL HISTORY: Past Medical History:  Diagnosis Date  . Anemia   . Bowel obstruction ([place])   . Calculus of gallbladder with acute cholecystitis, without mention  of obstruction 10/01/2013   Open chole on 10/26/13. 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 COPD (chronic obstructive pulmonary disease) ([place])   . Depression    "sold beach house 01/2012" (10/25/2013)  . Gall stones   . Goiter   . H/O sebaceous cyst   . Hepatic cyst 10/01/2013   CT  . History of diverticular abscess 09/2013  . Hyperlipemia   . Hypertension    "was taking RX; took me off it 09/2013" (10/25/2013)  . Hypothyroidism    goiter  . Insomnia   . MCI (mild cognitive impairment)   . Osteopenia   . Protein calorie malnutrition ([facility])    malabsorption  . Seasonal allergies   . Vitamin B12 deficiency     PAST SURGICAL HISTORY: Past Surgical History:  Procedure Laterality Date  . ABCESS DRAINAGE  2014   "had drain put in & stayed for ~ 1 wk to 10 day; just came out ~ 10 days ago" (10/25/2013)  . CATARACT EXTRACTION W/ INTRAOCULAR LENS IMPLANT Left ~ 2010  . CHOLECYSTECTOMY N/A 10/26/2013   Procedure: ATTEMPTED  LAPAROSCOPIC CONVERTED TO OPEN CHOLECYSTECTOMY WITH INTRAOPERATIVE CHOLANGIOGRAM; REPAIR OF [place] FISTULA; cholecystoduodenal fistula repair; lysis of adhesions; drainage of pelvic abscess; omental patching of duodenal repair;  Surgeon: Joyice Faster. Cornett, MD;  Location: [facility];  Service: General;  Laterality: N/A;  . COLON RESECTION  2014  . DILATION AND CURETTAGE OF UTERUS    . EXCISION MASS NECK Left 10/31/2016   Procedure: EXCISION CYST LEFT NECK;  Surgeon: Erroll Luna, MD;  Location: [facility];  Service: General;  Laterality: Left;  EXCISION CYST LEFT NECK    FAMILY HISTORY: Family History  Problem Relation Age of Onset  . Prostate cancer Father     SOCIAL HISTORY: Social History   Socioeconomic History  . Marital status: Married    Spouse name: [name]  . Number of children: 3  . Years of education: Not on file  . Highest education level: Bachelor's degree (e.g., BA,  AB, BS)  Occupational History  . Not on file  Tobacco Use  . Smoking status: Former Smoker    Packs/day: 0.25    Years: 55.00    Pack years: 13.75    Types: Cigarettes    Quit date: 01/20/2021    Years since quitting: 0.2  . Smokeless tobacco: Never Used  Vaping Use  . Vaping Use: Never used  Substance and Sexual Activity  . Alcohol use: Not Currently  . Drug use: No  . Sexual activity: Not Currently    Birth control/protection: Post-menopausal  Other Topics Concern  . Not on file  Social History Narrative   04/19/21 Lives with husband   Social Determinants of Health   Financial Resource Strain: Not on file  Food Insecurity: Not on file  Transportation Needs: Not on file  Physical Activity: Not on file  Stress: Not on file  Social Connections: Not on file  Intimate Partner Violence: Not on file  Total time spent reviewing the chart, obtaining history, examined patient, ordering tests, documentation, consultations and family, care coordination was 30 minutes   Marcial Pacas, M.D. Ph.D.  [facility] [address] [place], [place] [address] Ph: [phone] Fax: [phone]  CC:  Reynold Bowen, MD [place],  [place] [address]  Reynold Bowen, MD

## 2021-05-03 ENCOUNTER — Telehealth: Payer: Self-pay | Admitting: Neurology

## 2021-05-03 NOTE — Telephone Encounter (Signed)
LVM with the PT department of [facility] to see if we can send patient there. Phone: [phone].

## 2021-05-10 ENCOUNTER — Ambulatory Visit: Payer: Medicare HMO | Admitting: Neurology

## 2021-05-10 NOTE — Telephone Encounter (Signed)
Patient wants to have PT at her facility

## 2021-05-15 NOTE — Telephone Encounter (Signed)
I called [facility] and spoke with Chasity to inquire how we need to go about setting up PT for patient. She transferred me to [place] in PT. He advised me to fax the order to [phone]. If that does not work, he advised me to fax it to [phone].

## 2021-08-07 DIAGNOSIS — I700 Atherosclerosis of aorta: Secondary | ICD-10-CM | POA: Diagnosis not present

## 2021-08-07 DIAGNOSIS — M8580 Other specified disorders of bone density and structure, unspecified site: Secondary | ICD-10-CM | POA: Diagnosis not present

## 2021-08-07 DIAGNOSIS — E538 Deficiency of other specified B group vitamins: Secondary | ICD-10-CM | POA: Diagnosis not present

## 2021-08-07 DIAGNOSIS — J449 Chronic obstructive pulmonary disease, unspecified: Secondary | ICD-10-CM | POA: Diagnosis not present

## 2021-08-07 DIAGNOSIS — E039 Hypothyroidism, unspecified: Secondary | ICD-10-CM | POA: Diagnosis not present

## 2021-08-07 DIAGNOSIS — R7301 Impaired fasting glucose: Secondary | ICD-10-CM | POA: Diagnosis not present

## 2021-08-07 DIAGNOSIS — E785 Hyperlipidemia, unspecified: Secondary | ICD-10-CM | POA: Diagnosis not present

## 2021-08-07 DIAGNOSIS — Z0000 Encounter for general adult medical examination without abnormal findings: Secondary | ICD-10-CM | POA: Diagnosis not present

## 2021-08-07 DIAGNOSIS — F320 Major depressive disorder, single episode, mild: Secondary | ICD-10-CM | POA: Diagnosis not present

## 2021-08-07 DIAGNOSIS — G319 Degenerative disease of nervous system, unspecified: Secondary | ICD-10-CM | POA: Diagnosis not present

## 2021-11-13 ENCOUNTER — Ambulatory Visit: Payer: Medicare HMO | Admitting: Neurology

## 2021-12-19 DIAGNOSIS — E039 Hypothyroidism, unspecified: Secondary | ICD-10-CM | POA: Diagnosis not present

## 2021-12-19 DIAGNOSIS — I700 Atherosclerosis of aorta: Secondary | ICD-10-CM | POA: Diagnosis not present

## 2021-12-19 DIAGNOSIS — I10 Essential (primary) hypertension: Secondary | ICD-10-CM | POA: Diagnosis not present

## 2021-12-19 DIAGNOSIS — M8580 Other specified disorders of bone density and structure, unspecified site: Secondary | ICD-10-CM | POA: Diagnosis not present

## 2021-12-19 DIAGNOSIS — R7301 Impaired fasting glucose: Secondary | ICD-10-CM | POA: Diagnosis not present

## 2021-12-19 DIAGNOSIS — E876 Hypokalemia: Secondary | ICD-10-CM | POA: Diagnosis not present

## 2021-12-19 DIAGNOSIS — G3184 Mild cognitive impairment, so stated: Secondary | ICD-10-CM | POA: Diagnosis not present

## 2021-12-19 DIAGNOSIS — E785 Hyperlipidemia, unspecified: Secondary | ICD-10-CM | POA: Diagnosis not present

## 2021-12-19 DIAGNOSIS — J449 Chronic obstructive pulmonary disease, unspecified: Secondary | ICD-10-CM | POA: Diagnosis not present

## 2022-01-07 ENCOUNTER — Ambulatory Visit: Payer: Medicare HMO | Admitting: Neurology

## 2022-01-07 ENCOUNTER — Encounter: Payer: Self-pay | Admitting: Neurology

## 2022-01-07 VITALS — BP 130/79 | HR 74 | Ht 64.0 in | Wt 116.0 lb

## 2022-01-07 DIAGNOSIS — F03B0 Unspecified dementia, moderate, without behavioral disturbance, psychotic disturbance, mood disturbance, and anxiety: Secondary | ICD-10-CM | POA: Diagnosis not present

## 2022-01-07 DIAGNOSIS — R269 Unspecified abnormalities of gait and mobility: Secondary | ICD-10-CM

## 2022-01-07 MED ORDER — MEMANTINE HCL 5 MG PO TABS: 5.0000 mg | ORAL_TABLET | Freq: Two times a day (BID) | ORAL | 11 refills | Status: DC

## 2022-01-07 NOTE — Progress Notes (Signed)
Chief Complaint  Patient presents with   Follow-up    Rm 88, with husband and daughter, states she is well reports no falls, currently not doing PT States she has no new concerns or questions       ASSESSMENT AND PLAN  [name] is a 87 y.o. female  Dementia Gait abnormality  History of abdomen abscess, requiring extensive abdominal surgery in December 2014, severe protein calorie malnutrition, required prolonged TPN, significant decline since then,   MRI of the brain showed significant generalized atrophy, ventriculomegaly, cervical spine showed multilevel degenerative changes, no evidence of spinal cord compression  Likely combination of central nervous system degenerative disorder, with vascular component,  She has significant fluctuation of her gait abnormality, mentation,  She was given a short trial of Sinemet, up to 25/100 3 times daily, she denies significant benefit, no longer taking it,  I have referred her to physical therapy, encouraged her to moderate exercise,     DIAGNOSTIC DATA (LABS, IMAGING, TESTING) - I reviewed patient records, labs, notes, testing and imaging myself where available.   MRI cervical spine without contrast on Apr 24, 2021: - At C3-4 disc bulging with mild spinal stenosis and mild biforaminal stenosis. - Degenerative spondylosis and disc bulging from C3-4, C4-5 and C5-6 levels.   MRI brain (without), 04/24/2021 -Moderate to severe atrophy and moderate ventriculomegaly on ex vacuo basis. -Moderate chronic small vessel ischemic disease. -No acute findings.   Laboratory evaluation showed normal B12, thyroid panel, vitamin D, HISTORICAL  [name] is a 87 year old female, seen in request by her primary care physician Dr.   Forde Dandy, Annie Main, for evaluation of sudden worsening of memory loss, gait abnormality, she is accompanied by her daughter and husband at today's visit on 02/17/2021.  I reviewed and summarized the referring  note. Patient had extensive abdominal surgery in 2014, due to chronic acute on chronic cholecystitis with gangrenous gallbladder, fistula to duodenum from gallbladder, fistula to transverse colon from gallbladder, small bowel obstruction from pelvic adhesive secondary to previous diverticulitis and abscess, she underwent laparoscopic converted to open cholecystectomy, repair of cholecystoduodenal fistula, cholecystocolonic fistula, drainage of residual pelvic abscess, omental patching of duodenum and colonic repair,  She had prolonged postoperative ileus, was kept on TPN due to low p.o. intake, also had a significant postoperative pain, required polypharmacy treatment at that time,  Patient was discharged to nursing home following hospital admission, initially had significant memory loss, poor functional status, gradually recovered over the years, but was never able to bounce back to her presurgical level, had occasional bowel and bladder incontinence,  Before recent acute worsening, she was able to play bridges, walk without assistant, carry on simple conversations, family did reported that she has a long history of excessive alcohol use, can drink up to 2 bottles of wine in 1 day  Around April 20s,  she was noted to have acute worsening, had increased bowel and bladder, profound gait abnormality, worsening memory loss, husband reported that her symptoms happened overnight, but daughter reported that she already has a slow decline baseline  She was taken to emergency room on Apr 18 2021, laboratory evaluation showed no evidence of UTI, borderline troponin, normal WBC, hemoglobin of 15.3, CMP was normal, creatinine of 0.69  Update Apr 26, 2021, She is accompanied by her husband and daughter at today's clinical visit, she was getting a trial of Sinemet 25/100 mg 3 times daily for couple days, husband reported significant improvement, but daughter reported that she  sleeps so much after taking medication,  has quit giving her medication  Patient overall has improved, she can walk better, is more cooperative, but continues to have significant memory loss, gait abnormality, bowel and bladder incontinence,  Laboratory evaluation showed normal B12 CPK, thyroid function, vitamin D  MRI of the brain reviewed with patient and her family: Significant generalized atrophy, ventriculomegaly, profound periventricular white matter disease, which likely explain her significant memory loss, gait abnormality, fluctuation of her functional status  MRI of cervical spine showed multilevel degenerative changes, no evidence of cord compression  UPDATE Jan 07 2022: She lives with her [facility] at [facility], husband is the main caregiver, over the past few months, she has made some progress, has better appetite, able to ambulate without assistant, continue have significant memory loss, difficult to carry on a complete sentence conversation, less urinary/bowel accident  She did spend most of the time in a sitting position, husband is very dedicated to her care  REVIEW OF SYSTEMS:  Full 14 system review of systems performed and notable only for as above All other review of systems were negative.  PHYSICAL EXAM:   Vitals:   01/07/22 1306  BP: 130/79  Pulse: 74  Weight: 116 lb (52.6 kg)  Height: 5\' 4"  (1.626 m)   Not recorded     Body mass index is 19.91 kg/m.  PHYSICAL EXAMNIATION:  Gen: NAD, conversant, well nourised, well groomed     NEUROLOGICAL EXAM:  MENTAL STATUS: Speech:    Speech is normal; cooperative on examination, quiet, dependent on her family to provide medical history,  Montreal Cognitive Assessment  04/19/2021  Visuospatial/ Executive (0/5) 1  Naming (0/3) 3  Attention: Read list of digits (0/2) 1  Attention: Read list of letters (0/1) 1  Attention: Serial 7 subtraction starting at 100 (0/3) 1  Language: Repeat phrase (0/2) 2  Language : Fluency (0/1) 0   Abstraction (0/2) 2  Delayed Recall (0/5) 0  Orientation (0/6) 1  Total 12     CRANIAL NERVES: CN II: Visual fields are full to confrontation. Pupils are round equal and briskly reactive to light. CN III, IV, VI: extraocular movement are normal. No ptosis. CN V: Facial sensation is intact to light touch CN VII: Face is symmetric with normal eye closure  CN VIII: Hearing is normal to causal conversation. CN IX, X: Phonation is normal. CN XI: Head turning and shoulder shrug are intact  MOTOR: Fixation of left upper extremity upon rapid rotating movement, no significant rigidity, bradykinesia at today's examinations  REFLEXES: Reflexes are 1 and symmetric at the biceps, triceps, 3/3 knees, and trace at ankles. Plantar responses are extensor bilaterally.  SENSORY: Intact to light touch, pinprick and vibratory sensation are intact in fingers and toes.  COORDINATION: There is no trunk or limb dysmetria noted.  GAIT/STANCE: She needs push-up to get up from seated position, small stride, cautious, unsteady, decreased left arm swing, dragging left leg some  ALLERGIES: Allergies  Allergen Reactions   Ambien [Zolpidem Tartrate] Other (See Comments)    Makes her unsteady   Cymbalta [Duloxetine Hcl] Other (See Comments)    "kinda went nuts"   Halcion [Triazolam] Other (See Comments)    "kinda went nuts"    HOME MEDICATIONS: Current Outpatient Medications  Medication Sig Dispense Refill   Ascorbic Acid (VITAMIN C) 500 MG CAPS See admin instructions.     carbidopa-levodopa (SINEMET IR) 25-100 MG tablet Take 1 tablet by mouth 3 (three) times daily. [place]  tablet 2   cholecalciferol (VITAMIN D) 1000 UNITS tablet Take 1,000 Units by mouth daily.     Cyanocobalamin (VITAMIN B 12 PO) Take by mouth.     losartan (COZAAR) 50 MG tablet Take 1 tablet (50 mg total) by mouth daily. 30 tablet 0   MELATONIN PO Take by mouth.     mirabegron ER (MYRBETRIQ) 25 MG TB24 tablet Take 25 mg by mouth  daily.     Probiotic Product (PROBIOTIC PO) Take by mouth.     rosuvastatin (CRESTOR) 10 MG tablet Take 10 mg by mouth daily.     No current facility-administered medications for this visit.    PAST MEDICAL HISTORY: Past Medical History:  Diagnosis Date   Anemia    Bowel obstruction ([place])    Calculus of gallbladder with acute cholecystitis, without mention of obstruction 10/01/2013   Open chole on 10/26/13. 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    COPD (chronic obstructive pulmonary disease) ([place])    Depression    "sold beach house 01/2012" (10/25/2013)   Gall stones    Goiter    H/O sebaceous cyst    Hepatic cyst 10/01/2013   CT   History of diverticular abscess 09/2013   Hyperlipemia    Hypertension    "was taking RX; took me off it 09/2013" (10/25/2013)   Hypothyroidism    goiter   Insomnia    MCI (mild cognitive impairment)    Osteopenia    Protein calorie malnutrition ([place])    malabsorption   Seasonal allergies    Vitamin B12 deficiency     PAST SURGICAL HISTORY: Past Surgical History:  Procedure Laterality Date   ABCESS DRAINAGE  2014   "had drain put in & stayed for ~ 1 wk to 10 day; just came out ~ 10 days ago" (10/25/2013)   CATARACT EXTRACTION W/ INTRAOCULAR LENS IMPLANT Left ~ 2010   CHOLECYSTECTOMY N/A 10/26/2013   Procedure: ATTEMPTED  LAPAROSCOPIC CONVERTED TO OPEN CHOLECYSTECTOMY WITH INTRAOPERATIVE CHOLANGIOGRAM; REPAIR OF [place]; cholecystoduodenal fistula repair; lysis of adhesions; drainage of pelvic abscess; omental patching of duodenal repair;  Surgeon: Joyice Faster. Cornett, MD;  Location: [facility];  Service: General;  Laterality: N/A;   COLON RESECTION  2014   DILATION AND CURETTAGE OF UTERUS     EXCISION MASS NECK Left 10/31/2016   Procedure: EXCISION CYST LEFT NECK;  Surgeon: Erroll Luna, MD;  Location: [facility];  Service: General;  Laterality: Left;   EXCISION CYST LEFT NECK    FAMILY HISTORY: Family History  Problem Relation Age of Onset   Prostate cancer Father     SOCIAL HISTORY: Social History   Socioeconomic History   Marital status: Married    Spouse name: [name]   Number of children: 3   Years of education: Not on file   Highest education level: Bachelor's degree (e.g., BA, AB, BS)  Occupational History   Not on file  Tobacco Use   Smoking status: Former    Packs/day: 0.25    Years: 55.00    Pack years: 13.75    Types: Cigarettes    Quit date: 01/20/2021    Years since quitting: 0.9   Smokeless tobacco: Never  Vaping Use   Vaping Use: Never used  Substance and Sexual Activity   Alcohol use: Not Currently   Drug use: No   Sexual activity: Not Currently    Birth control/protection: Post-menopausal  Other Topics Concern   Not on file  Social History Narrative   04/19/21 Lives with husband   Social Determinants of Health   Financial Resource Strain: Not on file  Food Insecurity: Not on file  Transportation Needs: Not on file  Physical Activity: Not on file  Stress: Not on file  Social Connections: Not on file  Intimate Partner Violence: Not on file     Total time spent reviewing the chart, obtaining history, examined patient, ordering tests, documentation, consultations and family, care coordination was 57 minutes   Marcial Pacas, M.D. Ph.D.  [facility] [address] [place], [place] [address] Ph: [phone] Fax: [phone]  CC:  Reynold Bowen, MD [place],  [place] [address]  Reynold Bowen, MD

## 2022-01-07 NOTE — Patient Instructions (Signed)
Physical Therapy.  Have some person to come in to help

## 2022-01-08 ENCOUNTER — Telehealth: Payer: Self-pay | Admitting: Neurology

## 2022-01-08 NOTE — Telephone Encounter (Signed)
PT orders faxed to [facility] @ fax # [phone].

## 2022-07-11 ENCOUNTER — Ambulatory Visit: Payer: Medicare HMO | Admitting: Neurology

## 2022-07-15 DIAGNOSIS — F320 Major depressive disorder, single episode, mild: Secondary | ICD-10-CM | POA: Diagnosis not present

## 2022-07-15 DIAGNOSIS — I10 Essential (primary) hypertension: Secondary | ICD-10-CM | POA: Diagnosis not present

## 2022-07-15 DIAGNOSIS — J449 Chronic obstructive pulmonary disease, unspecified: Secondary | ICD-10-CM | POA: Diagnosis not present

## 2022-07-15 DIAGNOSIS — I700 Atherosclerosis of aorta: Secondary | ICD-10-CM | POA: Diagnosis not present

## 2022-07-15 DIAGNOSIS — G3184 Mild cognitive impairment, so stated: Secondary | ICD-10-CM | POA: Diagnosis not present

## 2022-07-15 DIAGNOSIS — E039 Hypothyroidism, unspecified: Secondary | ICD-10-CM | POA: Diagnosis not present

## 2022-07-15 DIAGNOSIS — E43 Unspecified severe protein-calorie malnutrition: Secondary | ICD-10-CM | POA: Diagnosis not present

## 2022-07-15 DIAGNOSIS — E785 Hyperlipidemia, unspecified: Secondary | ICD-10-CM | POA: Diagnosis not present

## 2022-07-15 DIAGNOSIS — F03B0 Unspecified dementia, moderate, without behavioral disturbance, psychotic disturbance, mood disturbance, and anxiety: Secondary | ICD-10-CM | POA: Diagnosis not present

## 2022-07-22 ENCOUNTER — Encounter: Payer: Self-pay | Admitting: Internal Medicine

## 2022-07-22 ENCOUNTER — Non-Acute Institutional Stay (SKILLED_NURSING_FACILITY): Payer: Medicare HMO | Admitting: Internal Medicine

## 2022-07-22 DIAGNOSIS — E039 Hypothyroidism, unspecified: Secondary | ICD-10-CM

## 2022-07-22 DIAGNOSIS — I10 Essential (primary) hypertension: Secondary | ICD-10-CM

## 2022-07-22 DIAGNOSIS — F03B0 Unspecified dementia, moderate, without behavioral disturbance, psychotic disturbance, mood disturbance, and anxiety: Secondary | ICD-10-CM | POA: Diagnosis not present

## 2022-07-22 DIAGNOSIS — E785 Hyperlipidemia, unspecified: Secondary | ICD-10-CM | POA: Diagnosis not present

## 2022-07-22 DIAGNOSIS — M7989 Other specified soft tissue disorders: Secondary | ICD-10-CM | POA: Diagnosis not present

## 2022-07-22 NOTE — Progress Notes (Signed)
Provider:  Veleta Miners MD Location:    [facility] Room Number: 505 Place of Service:  SNF (31)  PCP: Reynold Bowen, MD Patient Care Team: Reynold Bowen, MD as PCP - General (Endocrinology)  Extended Emergency Contact Information Primary Emergency Contact: [name],Dawn Address: [address], [place] [address] [name] of [place] Phone: [phone] Mobile Phone: [phone] Relation: Spouse Secondary Emergency Contact: [name],Dawn Address: [address]          [place], [place] Mobile Phone: [phone] Relation: Daughter  Code Status: DNR Goals of Care: Advanced Directive information    04/18/2021   11:55 AM  Advanced Directives  Does Patient Have a Medical Advance Directive? Yes  Type of Advance Directive Living will;Healthcare Power of Attorney      Chief Complaint  Patient presents with   New Admit To SNF    Admission to SNF    HPI: Patient is a 87 y.o. female seen today for admission to Memory Unit Patient was admitted in memory unit due to significant worsening of her dementia and requiring help in her ADLs  Patient has a history of dementia Recent MMSE score was 12 out of 30 in Dr. Krista Blue office MRI of the brain has showed generalized atrophy ventricular megaly Failed trial of Sinemet in the past  Also has a history of extensive abdominal surgery in 2014 due to gangrenous gallbladder and fistula History of alcohol abuse,, hypertension, hyperlipidemia and urinary incontinence  Did not have any acute complaints But she has ring  left hand finger it is swollen and Rings cant be removed   The nurses were concerned that it has odor and its probably infected.   Patient denied any complaint of pain.  Past Medical History:  Diagnosis Date   Anemia    Bowel obstruction (HCC)    Calculus of gallbladder with acute cholecystitis, without mention of obstruction 10/01/2013    Open chole on 10/26/13. 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    COPD (chronic obstructive pulmonary disease) ([place])    Depression    "sold beach house 01/2012" (10/25/2013)   Gall stones    Goiter    H/O sebaceous cyst    Hepatic cyst 10/01/2013   CT   History of diverticular abscess 09/2013   Hyperlipemia    Hypertension    "was taking RX; took me off it 09/2013" (10/25/2013)   Hypothyroidism    goiter   Insomnia    MCI (mild cognitive impairment)    Osteopenia    Protein calorie malnutrition ([place])    malabsorption   Seasonal allergies    Vitamin B12 deficiency    Past Surgical History:  Procedure Laterality Date   ABCESS DRAINAGE  2014   "had drain put in & stayed for ~ 1 wk to 10 day; just came out ~ 10 days ago" (10/25/2013)   CATARACT EXTRACTION W/ INTRAOCULAR LENS IMPLANT Left ~ 2010   CHOLECYSTECTOMY N/A 10/26/2013   Procedure: ATTEMPTED  LAPAROSCOPIC CONVERTED TO OPEN CHOLECYSTECTOMY WITH INTRAOPERATIVE CHOLANGIOGRAM; REPAIR OF [place]; cholecystoduodenal fistula repair; lysis of adhesions; drainage of pelvic abscess; omental patching of duodenal repair;  Surgeon: Joyice Faster. Cornett, MD;  Location: [facility];  Service: General;  Laterality: N/A;   COLON RESECTION  2014   DILATION AND CURETTAGE OF UTERUS     EXCISION MASS NECK Left 10/31/2016   Procedure: EXCISION CYST LEFT  NECK;  Surgeon: Erroll Luna, MD;  Location: [facility];  Service: General;  Laterality: Left;  EXCISION CYST LEFT NECK    reports that she quit smoking about 18 months ago. Her smoking use included cigarettes. She has a 13.75 pack-year smoking history. She has never used smokeless tobacco. She reports that she does not currently use alcohol. She reports that she does not use drugs. Social History   Socioeconomic History   Marital status: Married    Spouse name: Duard [name]   Number of children: 3   Years of  education: Not on file   Highest education level: Bachelor's degree (e.g., BA, AB, BS)  Occupational History   Not on file  Tobacco Use   Smoking status: Former    Packs/day: 0.25    Years: 55.00    Total pack years: 13.75    Types: Cigarettes    Quit date: 01/20/2021    Years since quitting: 1.5   Smokeless tobacco: Never  Vaping Use   Vaping Use: Never used  Substance and Sexual Activity   Alcohol use: Not Currently   Drug use: No   Sexual activity: Not Currently    Birth control/protection: Post-menopausal  Other Topics Concern   Not on file  Social History Narrative   04/19/21 Lives with husband   Social Determinants of Health   Financial Resource Strain: Not on file  Food Insecurity: Not on file  Transportation Needs: Not on file  Physical Activity: Not on file  Stress: Not on file  Social Connections: Not on file  Intimate Partner Violence: Not on file    Functional Status Survey:    Family History  Problem Relation Age of Onset   Prostate cancer Father     Health Maintenance  Topic Date Due   COVID-19 Vaccine (1) Never done   Zoster Vaccines- Shingrix (1 of 2) Never done   DEXA SCAN  Never done   TETANUS/TDAP  08/16/2020   INFLUENZA VACCINE  07/16/2022   Pneumonia Vaccine 23+ Years old  Completed   HPV VACCINES  Aged Out    Allergies  Allergen Reactions   Ambien [Zolpidem Tartrate] Other (See Comments)    Makes her unsteady   Cymbalta [Duloxetine Hcl] Other (See Comments)    "kinda went nuts"   Halcion [Triazolam] Other (See Comments)    "kinda went nuts"    Allergies as of 07/22/2022       Reactions   Ambien [zolpidem Tartrate] Other (See Comments)   Makes her unsteady   Cymbalta [duloxetine Hcl] Other (See Comments)   "kinda went nuts"   Halcion [triazolam] Other (See Comments)   "kinda went nuts"        Medication List        Accurate as of July 22, 2022 10:57 AM. If you have any questions, ask your nurse or doctor.           STOP taking these medications    MELATONIN PO Stopped by: Virgie Dad, MD   PROBIOTIC PO Stopped by: Virgie Dad, MD       TAKE these medications    cholecalciferol 1000 units tablet Commonly known as: VITAMIN D Take 2,000 Units by mouth daily.   losartan 50 MG tablet Commonly known as: Cozaar Take 1 tablet (50 mg total) by mouth daily.   memantine 5 MG tablet Commonly known as: Namenda Take 1 tablet (5 mg total) by mouth 2 (two) times daily.   mirabegron ER 25  MG Tb24 tablet Commonly known as: MYRBETRIQ Take 25 mg by mouth daily.   rosuvastatin 10 MG tablet Commonly known as: CRESTOR Take 10 mg by mouth daily.   VITAMIN B 12 PO Take by mouth.   Vitamin C 500 MG Caps See admin instructions.        Review of Systems  Constitutional:  Negative for activity change and appetite change.  HENT: Negative.    Respiratory:  Negative for cough and shortness of breath.   Cardiovascular:  Negative for leg swelling.  Gastrointestinal:  Negative for constipation.  Genitourinary: Negative.   Musculoskeletal:  Negative for arthralgias, gait problem and myalgias.  Skin: Negative.   Neurological:  Negative for dizziness and weakness.  Psychiatric/Behavioral:  Positive for confusion. Negative for dysphoric mood and sleep disturbance.     Vitals:   07/22/22 1052  BP: (!) 146/80  Pulse: 70  Resp: 16  Temp: 98.1 F (36.7 C)  SpO2: 96%  Weight: 135 lb 6.4 oz (61.4 kg)  Height: '5\' 4"'$  (1.626 m)   Body mass index is 23.24 kg/m. Physical Exam Vitals reviewed.  Constitutional:      Appearance: Normal appearance.  HENT:     Head: Normocephalic.     Nose: Nose normal.     Mouth/Throat:     Mouth: Mucous membranes are moist.     Pharynx: Oropharynx is clear.  Eyes:     Pupils: Pupils are equal, round, and reactive to light.  Cardiovascular:     Rate and Rhythm: Normal rate and regular rhythm.     Pulses: Normal pulses.     Heart sounds: Normal heart  sounds. No murmur heard. Pulmonary:     Effort: Pulmonary effort is normal.     Breath sounds: Normal breath sounds.  Abdominal:     General: Abdomen is flat. Bowel sounds are normal.     Palpations: Abdomen is soft.  Musculoskeletal:        General: No swelling.     Cervical back: Neck supple.     Comments: Ring finger of left hand is swollen and red but not painful or any  infection. Rings are Stuck and can not  be moved   Skin:    General: Skin is warm.  Neurological:     General: No focal deficit present.     Mental Status: She is alert.  Psychiatric:        Mood and Affect: Mood normal.        Thought Content: Thought content normal.        No data to display           Labs reviewed: Basic Metabolic Panel: No results for input(s): "NA", "K", "CL", "CO2", "GLUCOSE", "BUN", "CREATININE", "CALCIUM", "MG", "PHOS" in the last 8760 hours. Liver Function Tests: No results for input(s): "AST", "ALT", "ALKPHOS", "BILITOT", "PROT", "ALBUMIN" in the last 8760 hours. No results for input(s): "LIPASE", "AMYLASE" in the last 8760 hours. No results for input(s): "AMMONIA" in the last 8760 hours. CBC: No results for input(s): "WBC", "NEUTROABS", "HGB", "HCT", "MCV", "PLT" in the last 8760 hours. Cardiac Enzymes: No results for input(s): "CKTOTAL", "CKMB", "CKMBINDEX", "TROPONINI" in the last 8760 hours. BNP: Invalid input(s): "POCBNP" No results found for: "HGBA1C" Lab Results  Component Value Date   TSH 3.170 04/19/2021   Lab Results  Component Value Date   VITAMINB12 1,894 (H) 04/19/2021   No results found for: "FOLATE" No results found for: "IRON", "TIBC", "FERRITIN"  Imaging and Procedures  obtained prior to SNF admission: MR CERVICAL SPINE WO CONTRAST  Result Date: 04/26/2021 [facility] NEUROIMAG[facility] REPORT STUDY DATE: 04/24/21 PATIENT NAME: [name] DOB: Mar 26, 1935 MRN: [id number] ORDERING CLINICIAN: Marcial Pacas, MD CLINICAL HISTORY: 87 year  old female with gait difficulty. EXAM: MR CERVICAL SPINE WO CONTRAST TECHNIQUE: MRI of the cervical spine was obtained utilizing 3 mm sagittal slices from the posterior fossa down to the T3-4 level with T1, T2 and inversion recovery views. In addition 4 mm axial slices from K9-9 down to T1-2 level were included with T2 and gradient echo views. CONTRAST: none COMPARISON: none IMAGING SITE: [facility] [address] (1.5 Tesla MRI)  FINDINGS: On sagittal views the vertebral bodies have normal height and alignment.  Degenerative spondylosis and disc bulging from C3-4, C4-5 and C5-6 levels.  The spinal cord is normal in size and appearance. The posterior fossa, pituitary gland and paraspinal soft tissues are unremarkable.  On axial views: C2-3 no spinal stenosis or foraminal narrowing C3-4 disc bulging with mild spinal stenosis and mild biforaminal stenosis C4-5 disc bulging with mild right foraminal stenosis C5-6 disc bulging with no spinal stenosis or foraminal narrowing C6-7 facet hypertrophy with no spinal stenosis or foraminal narrowing C7-T1 no spinal stenosis or foraminal narrowing Limited views of the soft tissues of the head and neck are unremarkable.   MRI cervical spine (without) demonstrating: - At C3-4 disc bulging with mild spinal stenosis and mild biforaminal stenosis. - Degenerative spondylosis and disc bulging from C3-4, C4-5 and C5-6 levels. INTERPRETING PHYSICIAN: Penni Bombard, MD Certified in Neurology, Neurophysiology and Neuroimaging [facility] [address], [place], [place] [address] [phone]   MR BRAIN WO CONTRAST  Result Date: 04/26/2021 [facility] NEUROIMAG[facility] REPORT STUDY DATE: 04/24/21 PATIENT NAME: [name] DOB: [birth year] MRN: [id number] ORDERING CLINICIAN: Marcial Pacas, MD CLINICAL HISTORY: 87 year old female with gait difficulty. EXAM: MR BRAIN WO CONTRAST TECHNIQUE: MRI of the brain without contrast was  obtained utilizing 5 mm axial slices with T1, T2, T2 flair, SWI and diffusion weighted views.  T1 sagittal and T2 coronal views were obtained. CONTRAST: none COMPARISON: none IMAGING SITE: [facility] [address] (1.5 Tesla MRI)  FINDINGS: No abnormal lesions are seen on diffusion-weighted views to suggest acute ischemia. The cortical sulci, fissures and cisterns are notable for moderate to severe perisylvian and mesial temporal atrophy.  Mild to moderate generalized and cerebral atrophy also noted.  In size and appearance. Lateral, third and fourth ventricle are normal in size and appearance. No extra-axial fluid collections are seen.  Moderate periventricular and subcortical chronic small vessel ischemic disease.  No evidence of mass effect or midline shift.  On sagittal views the posterior fossa, pituitary gland and corpus callosum are notable for bilateral lens extractions.  Nonspecific fluid/inflammation of the bilateral mastoid air cells. No evidence of intracranial hemorrhage on SWI views. The orbits and their contents, paranasal sinuses and calvarium are unremarkable.  Intracranial flow voids are present.   MRI brain (without) demonstrating: -Moderate to severe atrophy and moderate ventriculomegaly on ex vacuo basis. -Moderate chronic small vessel ischemic disease. -No acute findings. INTERPRETING PHYSICIAN: Penni Bombard, MD Certified in Neurology, Neurophysiology and Neuroimaging [facility] [address], [place], [place] [address] [phone]    Assessment/Plan 1. Moderate dementia,  Per Dr Krista Blue Previous MMSE 12/30 MRI in 5/22 Severe Atropy Plan for her to stay here in Memory unit Will increase her  Namenda to 10 mg BID  2. Acquired hypothyroidism Not taking her Synthroid Will Repeat TSH  3. Essential hypertension, benign Doing well with Cozaar  4. Hyperlipidemia, unspecified hyperlipidemia type On Statin Repeat Fasting lipids  5.  Swollen finger Not infected Will try Ice and Soap to help get rings loose 6 Urinary Incontinence On Myrbetriq   Family/ staff Communication:   Labs/tests ordered:CBC,Cmp,TSH,Lipid

## 2022-07-23 DIAGNOSIS — E119 Type 2 diabetes mellitus without complications: Secondary | ICD-10-CM | POA: Diagnosis not present

## 2022-07-25 DIAGNOSIS — F01A0 Vascular dementia, mild, without behavioral disturbance, psychotic disturbance, mood disturbance, and anxiety: Secondary | ICD-10-CM | POA: Diagnosis not present

## 2022-07-25 DIAGNOSIS — R4189 Other symptoms and signs involving cognitive functions and awareness: Secondary | ICD-10-CM | POA: Diagnosis not present

## 2022-07-25 DIAGNOSIS — R278 Other lack of coordination: Secondary | ICD-10-CM | POA: Diagnosis not present

## 2022-07-25 DIAGNOSIS — R2681 Unsteadiness on feet: Secondary | ICD-10-CM | POA: Diagnosis not present

## 2022-07-25 DIAGNOSIS — G3184 Mild cognitive impairment, so stated: Secondary | ICD-10-CM | POA: Diagnosis not present

## 2022-07-25 DIAGNOSIS — R2689 Other abnormalities of gait and mobility: Secondary | ICD-10-CM | POA: Diagnosis not present

## 2022-07-31 DIAGNOSIS — R2689 Other abnormalities of gait and mobility: Secondary | ICD-10-CM | POA: Diagnosis not present

## 2022-07-31 DIAGNOSIS — R278 Other lack of coordination: Secondary | ICD-10-CM | POA: Diagnosis not present

## 2022-07-31 DIAGNOSIS — F01A0 Vascular dementia, mild, without behavioral disturbance, psychotic disturbance, mood disturbance, and anxiety: Secondary | ICD-10-CM | POA: Diagnosis not present

## 2022-07-31 DIAGNOSIS — G3184 Mild cognitive impairment, so stated: Secondary | ICD-10-CM | POA: Diagnosis not present

## 2022-07-31 DIAGNOSIS — R2681 Unsteadiness on feet: Secondary | ICD-10-CM | POA: Diagnosis not present

## 2022-08-07 DIAGNOSIS — F01A0 Vascular dementia, mild, without behavioral disturbance, psychotic disturbance, mood disturbance, and anxiety: Secondary | ICD-10-CM | POA: Diagnosis not present

## 2022-08-07 DIAGNOSIS — R2689 Other abnormalities of gait and mobility: Secondary | ICD-10-CM | POA: Diagnosis not present

## 2022-08-07 DIAGNOSIS — R2681 Unsteadiness on feet: Secondary | ICD-10-CM | POA: Diagnosis not present

## 2022-08-07 DIAGNOSIS — R278 Other lack of coordination: Secondary | ICD-10-CM | POA: Diagnosis not present

## 2022-08-07 DIAGNOSIS — G3184 Mild cognitive impairment, so stated: Secondary | ICD-10-CM | POA: Diagnosis not present

## 2022-08-14 DIAGNOSIS — G3184 Mild cognitive impairment, so stated: Secondary | ICD-10-CM | POA: Diagnosis not present

## 2022-08-14 DIAGNOSIS — F01A0 Vascular dementia, mild, without behavioral disturbance, psychotic disturbance, mood disturbance, and anxiety: Secondary | ICD-10-CM | POA: Diagnosis not present

## 2022-08-14 DIAGNOSIS — R278 Other lack of coordination: Secondary | ICD-10-CM | POA: Diagnosis not present

## 2022-08-14 DIAGNOSIS — R2689 Other abnormalities of gait and mobility: Secondary | ICD-10-CM | POA: Diagnosis not present

## 2022-08-14 DIAGNOSIS — R2681 Unsteadiness on feet: Secondary | ICD-10-CM | POA: Diagnosis not present

## 2022-08-21 ENCOUNTER — Ambulatory Visit: Payer: Medicare HMO | Admitting: Neurology

## 2022-08-21 ENCOUNTER — Encounter: Payer: Self-pay | Admitting: Neurology

## 2022-08-21 VITALS — BP 112/70 | HR 80 | Ht 64.0 in | Wt 135.5 lb

## 2022-08-21 DIAGNOSIS — R269 Unspecified abnormalities of gait and mobility: Secondary | ICD-10-CM

## 2022-08-21 DIAGNOSIS — F0390 Unspecified dementia without behavioral disturbance: Secondary | ICD-10-CM

## 2022-08-21 NOTE — Progress Notes (Signed)
Chief Complaint  Patient presents with   Follow-up    Rm 12. Accompanied by husband. Husband states patient is adjusting well to memory care.   ASSESSMENT AND PLAN  [name] is a 87 y.o. female  1.  Dementia  2.  Gait abnormality  3.  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, symptoms fluctuate -Now living at [facility] 19/30 today, improved from 12/30 -TSH was elevated 17.80, now taking Synthroid -On Namenda 10 mg twice daily -Previous trial of Sinemet 25/100 mg 3 times daily, failed to show significant benefit, it was discontinued -Encouraged PT/OT, remain active, encourage social engagement in activity  -MRI of the brain showed significant generalized atrophy, ventriculomegaly, cervical spine showed multilevel degenerative changes, no evidence of spinal cord compression -Follow-up with me in 6 months or sooner if needed   DIAGNOSTIC DATA (LABS, IMAGING, TESTING)  MRI cervical spine without contrast on Apr 24, 2021: - At C3-4 disc bulging with mild spinal stenosis and mild biforaminal stenosis. - Degenerative spondylosis and disc bulging from C3-4, C4-5 and C5-6 levels.  MRI brain (without), 04/24/2021 -Moderate to severe atrophy and moderate ventriculomegaly on ex vacuo basis. -Moderate chronic small vessel ischemic disease. -No acute findings.  Laboratory evaluation showed normal B12, thyroid panel, vitamin D  Labs from facility 07/23/22 showed creatinine 0.78, normal ALT, AST, TSH elevated 17.80, CBC unremarkable  HISTORICAL  [name] is a 87 year old female, seen in request by her primary care physician Dr.   Forde Dandy, Annie Main, for evaluation of sudden worsening of memory loss, gait abnormality, she is accompanied by  her daughter and husband at today's visit on 02/17/2021.  I reviewed and summarized the referring note. Patient had extensive abdominal surgery in 2014, due to chronic acute on chronic cholecystitis with gangrenous gallbladder, fistula to duodenum from gallbladder, fistula to transverse colon from gallbladder, small bowel obstruction from pelvic adhesive secondary to previous diverticulitis and abscess, she underwent laparoscopic converted to open cholecystectomy, repair of cholecystoduodenal fistula, cholecystocolonic fistula, drainage of residual pelvic abscess, omental patching of duodenum and colonic repair,  She had prolonged postoperative ileus, was kept on TPN due to low p.o. intake, also had a significant postoperative pain, required polypharmacy treatment at that time,  Patient was discharged to nursing home following hospital admission, initially had significant memory loss, poor functional status, gradually recovered over the years, but was never able to bounce back to her presurgical level, had occasional bowel and bladder incontinence,  Before recent acute worsening, she was able to play bridges, walk without assistant, carry on simple conversations, family did reported that she has a long history of excessive alcohol use, can drink up to 2 bottles of wine in 1 day  Around April 20s,  she was noted to have acute worsening, had increased bowel and bladder, profound gait abnormality, worsening memory loss, husband reported that her symptoms happened overnight, but daughter reported that she already has a slow decline baseline  She was taken to emergency room on Apr 18 2021, laboratory evaluation showed no evidence of UTI, borderline troponin, normal WBC, hemoglobin of 15.3, CMP was normal, creatinine of 0.69  Update Apr 26, 2021, She is accompanied by her husband and daughter at today's clinical visit, she was getting a trial of Sinemet 25/100 mg 3 times daily for couple days, husband  reported significant improvement, but daughter reported that she sleeps so much after taking medication, has quit giving her medication  Patient overall has improved, she can walk better, is more cooperative, but continues to have significant memory loss, gait abnormality, bowel and bladder incontinence,  Laboratory evaluation showed normal B12 CPK, thyroid function, vitamin D  MRI of the brain reviewed with patient and her family: Significant generalized atrophy, ventriculomegaly, profound periventricular white matter disease, which likely explain her significant memory loss, gait abnormality, fluctuation of her functional status  MRI of cervical spine showed multilevel degenerative changes, no evidence of cord compression  UPDATE Jan 07 2022: She lives with [facility] at [facility], husband is the main caregiver, over the past few months, she has made some progress, has better appetite, able to ambulate without assistant, continue have significant memory loss, difficult to carry on a complete sentence conversation, less urinary/bowel accident  She did spend most of the time in a sitting position, husband is very dedicated to her care  Update August 21, 2022 SS: Has lived at [facility] for 8 years, just moved to memory unit 5 weeks ago. Adjusting well. Notable more interactive than other residents. Can sing all the songs, forgets details about family members, their names. Can spell nearly everything. Does her own dressings, gets help with dressing. Walks with rolling walker. No falls. No complaints. 04/19/21 [place] 12/30, today 19/30.  REVIEW OF SYSTEMS:  Full 14 system review of systems performed and notable only for as above All other review of systems were negative.  PHYSICAL EXAM:   Vitals:   08/21/22 1453  BP: 112/70  Pulse: 80  Weight: 135 lb 8 oz (61.5 kg)  Height: '5\' 4"'$  (1.626 m)    Not recorded     Body mass index is 23.26 kg/m.  PHYSICAL  EXAMNIATION:  Gen: NAD, conversant, well nourised, well groomed     NEUROLOGICAL EXAM:  MENTAL STATUS: Speech:    Speech is normal; cooperative, relies on her husband for history, she gives some inaccurate details, quiet     08/21/2022    3:49 PM 04/19/2021    8:00 PM  Montreal Cognitive Assessment   Visuospatial/ Executive (0/5) 4 1  Naming (0/3) 3 3  Attention: Read list of digits (0/2) 2 1  Attention: Read list of letters (0/1) 1 1  Attention: Serial 7 subtraction starting at 100 (0/3) 2 1  Language: Repeat phrase (0/2) 2 2  Language : Fluency (0/1) 1 0  Abstraction (0/2) 1 2  Delayed Recall (0/5) 0 0  Orientation (0/6) 3 1  Total 19 12    CRANIAL NERVES: CN II: Visual fields are full to confrontation. Pupils are round equal and briskly reactive to light. CN III, IV, VI: extraocular movement are normal. No ptosis. CN V: Facial sensation is intact to light touch CN VII: Face is symmetric with normal eye closure  CN VIII: Hearing is normal to causal conversation. CN IX, X: Phonation is normal. CN XI: Head turning and shoulder shrug are intact  MOTOR: Good strength all extremities, no significant bradykinesia, or rigidity noted  REFLEXES: Reflexes were normal, no abnormalities  SENSORY: Intact to light touch  COORDINATION: There is no trunk or limb dysmetria noted.  GAIT/STANCE: She needs push-up to get up from seated position, small shuffling stride, decreased arm swing on the left, tends to pull arm toward her side  ALLERGIES: Allergies  Allergen Reactions   Ambien [Zolpidem Tartrate] Other (See Comments)  Makes her unsteady   Cymbalta [Duloxetine Hcl] Other (See Comments)    "kinda went nuts"   Halcion [Triazolam] Other (See Comments)    "kinda went nuts"    HOME MEDICATIONS: Current Outpatient Medications  Medication Sig Dispense Refill   Ascorbic Acid (VITAMIN C) 500 MG CAPS See admin instructions.     cholecalciferol (VITAMIN D) 1000 UNITS tablet  Take 2,000 Units by mouth daily.     Cyanocobalamin (VITAMIN B 12 PO) Take by mouth.     losartan (COZAAR) 50 MG tablet Take 1 tablet (50 mg total) by mouth daily. 30 tablet 0   memantine (NAMENDA) 5 MG tablet Take 1 tablet (5 mg total) by mouth 2 (two) times daily. 60 tablet 11   mirabegron ER (MYRBETRIQ) 25 MG TB24 tablet Take 25 mg by mouth daily.     rosuvastatin (CRESTOR) 10 MG tablet Take 10 mg by mouth daily.     No current facility-administered medications for this visit.    PAST MEDICAL HISTORY: Past Medical History:  Diagnosis Date   Anemia    Bowel obstruction ([place])    Calculus of gallbladder with acute cholecystitis, without mention of obstruction 10/01/2013   Open chole on 10/26/13. 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    COPD (chronic obstructive pulmonary disease) ([place])    Depression    "sold beach house 01/2012" (10/25/2013)   Gall stones    Goiter    H/O sebaceous cyst    Hepatic cyst 10/01/2013   CT   History of diverticular abscess 09/2013   Hyperlipemia    Hypertension    "was taking RX; took me off it 09/2013" (10/25/2013)   Hypothyroidism    goiter   Insomnia    MCI (mild cognitive impairment)    Osteopenia    Protein calorie malnutrition ([place])    malabsorption   Seasonal allergies    Vitamin B12 deficiency     PAST SURGICAL HISTORY: Past Surgical History:  Procedure Laterality Date   ABCESS DRAINAGE  2014   "had drain put in & stayed for ~ 1 wk to 10 day; just came out ~ 10 days ago" (10/25/2013)   CATARACT EXTRACTION W/ INTRAOCULAR LENS IMPLANT Left ~ 2010   CHOLECYSTECTOMY N/A 10/26/2013   Procedure: ATTEMPTED  LAPAROSCOPIC CONVERTED TO OPEN CHOLECYSTECTOMY WITH INTRAOPERATIVE CHOLANGIOGRAM; REPAIR OF [place]; cholecystoduodenal fistula repair; lysis of adhesions; drainage of pelvic abscess; omental patching of duodenal repair;  Surgeon: Joyice Faster. Cornett, MD;  Location:  [place];  Service: General;  Laterality: N/A;   COLON RESECTION  2014   DILATION AND CURETTAGE OF UTERUS     EXCISION MASS NECK Left 10/31/2016   Procedure: EXCISION CYST LEFT NECK;  Surgeon: Erroll Luna, MD;  Location: [facility];  Service: General;  Laterality: Left;  EXCISION CYST LEFT NECK    FAMILY HISTORY: Family History  Problem Relation Age of Onset   Prostate cancer Father     SOCIAL HISTORY: Social History   Socioeconomic History   Marital status: Married    Spouse name: [name]   Number of children: 3   Years of education: Not on file   Highest education level: Bachelor's degree (e.g., BA, AB, BS)  Occupational History   Not on file  Tobacco Use   Smoking status: Former    Packs/day: 0.25    Years: 55.00    Total pack years: 13.75    Types: Cigarettes    Quit date: 01/20/2021  Years since quitting: 1.5   Smokeless tobacco: Never  Vaping Use   Vaping Use: Never used  Substance and Sexual Activity   Alcohol use: Not Currently   Drug use: No   Sexual activity: Not Currently    Birth control/protection: Post-menopausal  Other Topics Concern   Not on file  Social History Narrative   04/19/21 Lives with husband   Social Determinants of Health   Financial Resource Strain: Not on file  Food Insecurity: Not on file  Transportation Needs: Not on file  Physical Activity: Not on file  Stress: Not on file  Social Connections: Not on file  Intimate Partner Violence: Not on file    Butler Denmark, Laqueta Jean, [facility] [address] [place], [place] [address] [phone]

## 2022-08-21 NOTE — Patient Instructions (Signed)
[place] 19/30 today  No changes in medications  See you back in 6 months

## 2022-09-10 DIAGNOSIS — G3184 Mild cognitive impairment, so stated: Secondary | ICD-10-CM | POA: Diagnosis not present

## 2022-09-10 DIAGNOSIS — R278 Other lack of coordination: Secondary | ICD-10-CM | POA: Diagnosis not present

## 2022-09-10 DIAGNOSIS — R2689 Other abnormalities of gait and mobility: Secondary | ICD-10-CM | POA: Diagnosis not present

## 2022-09-10 DIAGNOSIS — R2681 Unsteadiness on feet: Secondary | ICD-10-CM | POA: Diagnosis not present

## 2022-09-10 DIAGNOSIS — F01A0 Vascular dementia, mild, without behavioral disturbance, psychotic disturbance, mood disturbance, and anxiety: Secondary | ICD-10-CM | POA: Diagnosis not present

## 2022-09-12 ENCOUNTER — Encounter: Payer: Self-pay | Admitting: Adult Health

## 2022-09-12 ENCOUNTER — Non-Acute Institutional Stay (SKILLED_NURSING_FACILITY): Payer: Medicare HMO | Admitting: Adult Health

## 2022-09-12 DIAGNOSIS — J449 Chronic obstructive pulmonary disease, unspecified: Secondary | ICD-10-CM

## 2022-09-12 DIAGNOSIS — Z66 Do not resuscitate: Secondary | ICD-10-CM

## 2022-09-12 DIAGNOSIS — E039 Hypothyroidism, unspecified: Secondary | ICD-10-CM | POA: Diagnosis not present

## 2022-09-12 DIAGNOSIS — F5101 Primary insomnia: Secondary | ICD-10-CM | POA: Diagnosis not present

## 2022-09-12 DIAGNOSIS — F03B0 Unspecified dementia, moderate, without behavioral disturbance, psychotic disturbance, mood disturbance, and anxiety: Secondary | ICD-10-CM

## 2022-09-12 DIAGNOSIS — I10 Essential (primary) hypertension: Secondary | ICD-10-CM | POA: Diagnosis not present

## 2022-09-12 DIAGNOSIS — E782 Mixed hyperlipidemia: Secondary | ICD-10-CM

## 2022-09-12 MED ORDER — MEMANTINE HCL 5 MG PO TABS: 10.0000 mg | ORAL_TABLET | Freq: Two times a day (BID) | ORAL | 11 refills | Status: AC

## 2022-09-12 NOTE — Progress Notes (Signed)
Location:  Occupational psychologist of Service:  SNF (31) Provider:   Cindi Carbon, [place] [phone]   Virgie Dad, MD  Patient Care Team: Virgie Dad, MD as PCP - General (Internal Medicine)  Extended Emergency Contact Information Primary Emergency Contact: Krizek,Wallace Address: [address], [place] [address] [name] of [place] Phone: [phone] Mobile Phone: [phone] Relation: Spouse Secondary Emergency Contact: Brady,Yanette Address: [address]          [place], [place] of [place] Mobile Phone: [phone] Relation: Daughter  Code Status:  DNR Goals of care: Advanced Directive information    04/18/2021   11:55 AM  Advanced Directives  Does Patient Have a Medical Advance Directive? Yes  Type of Advance Directive Living will;Healthcare Power of Attorney     Chief Complaint  Patient presents with   Medical Management of Chronic Issues    HPI:  Pt is a 87 y.o. female seen today for medical management of chronic diseases.     Moved to memory care after progression of dementia. [facility] 19/30 08/21/22 [facility] 20/30 07/18/22. Pleasant, at times paranoid. No behaviors. Walks with a walker. Has tried sinemet for slow movement and bradykinesia with no benefit. MRI 04/24/21 showed sgeneralized atrophy, ventriculomegaly, cervical spine showed multilevel degenerative changes, Likely central nervous system degenerative disorder, with vascular component. Had a declined after intrabd infection in 2014 with prolonged TPN.  She has no acute complaints. Seems to have adjusted well.  Nurse asked for sleep agent one week ago. Melatonin started. No notes in matrix or reports of insomnia per staff.   COPD: hx of smoking, no current symptoms of cough or sob.   HTN: One elevated reading one day ago, repeat check today 124/80  Hypothyroidism: TSH 17.8 07/23/22, placed on synthroid. No current  symptoms. Hx of goiter.    Past Medical History:  Diagnosis Date   Anemia    Bowel obstruction (HCC)    Calculus of gallbladder with acute cholecystitis, without mention of obstruction 10/01/2013   Open chole on 10/26/13. 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    COPD (chronic obstructive pulmonary disease) ([place])    Depression    "sold beach house 01/2012" (10/25/2013)   Gall stones    Goiter    H/O sebaceous cyst    Hepatic cyst 10/01/2013   CT   History of diverticular abscess 09/2013   Hyperlipemia    Hypertension    "was taking RX; took me off it 09/2013" (10/25/2013)   Hypothyroidism    goiter   Insomnia    MCI (mild cognitive impairment)    Osteopenia    Protein calorie malnutrition ([place])    malabsorption   Seasonal allergies    Vitamin B12 deficiency    Past Surgical History:  Procedure Laterality Date   ABCESS DRAINAGE  2014   "had drain put in & stayed for ~ 1 wk to 10 day; just came out ~ 10 days ago" (10/25/2013)   CATARACT EXTRACTION W/ INTRAOCULAR LENS IMPLANT Left ~ 2010   CHOLECYSTECTOMY N/A 10/26/2013   Procedure: ATTEMPTED  LAPAROSCOPIC CONVERTED TO OPEN CHOLECYSTECTOMY WITH INTRAOPERATIVE CHOLANGIOGRAM; REPAIR OF [place]; cholecystoduodenal fistula repair; lysis of adhesions; drainage of pelvic abscess; omental patching of duodenal repair;  Surgeon: Joyice Faster. Cornett, MD;  Location: [facility];  Service: General;  Laterality: N/A;   COLON RESECTION  2014  DILATION AND CURETTAGE OF UTERUS     EXCISION MASS NECK Left 10/31/2016   Procedure: EXCISION CYST LEFT NECK;  Surgeon: Erroll Luna, MD;  Location: [facility];  Service: General;  Laterality: Left;  EXCISION CYST LEFT NECK    Allergies  Allergen Reactions   Ambien [Zolpidem Tartrate] Other (See Comments)    Makes her unsteady   Cymbalta [Duloxetine Hcl] Other (See Comments)    "kinda went nuts"   Halcion  [Triazolam] Other (See Comments)    "kinda went nuts"    Outpatient Encounter Medications as of 09/12/2022  Medication Sig   levothyroxine (SYNTHROID) 50 MCG tablet Take 50 mcg by mouth daily before breakfast.   Melatonin 5 MG CAPS Take 5 mg by mouth at bedtime.   Ascorbic Acid (VITAMIN C) 500 MG CAPS See admin instructions.   cholecalciferol (VITAMIN D) 1000 UNITS tablet Take 2,000 Units by mouth daily.   Cyanocobalamin (VITAMIN B 12 PO) Take by mouth.   losartan (COZAAR) 50 MG tablet Take 1 tablet (50 mg total) by mouth daily.   memantine (NAMENDA) 5 MG tablet Take 2 tablets (10 mg total) by mouth 2 (two) times daily.   mirabegron ER (MYRBETRIQ) 25 MG TB24 tablet Take 25 mg by mouth daily.   rosuvastatin (CRESTOR) 10 MG tablet Take 10 mg by mouth daily.   [DISCONTINUED] memantine (NAMENDA) 5 MG tablet Take 1 tablet (5 mg total) by mouth 2 (two) times daily.   No facility-administered encounter medications on file as of 09/12/2022.    Review of Systems  Constitutional:  Negative for activity change, appetite change, chills, diaphoresis, fatigue, fever and unexpected weight change.  HENT:  Negative for congestion.   Respiratory:  Negative for cough, shortness of breath and wheezing.   Cardiovascular:  Negative for chest pain, palpitations and leg swelling.  Gastrointestinal:  Negative for abdominal distention, abdominal pain, constipation and diarrhea.  Genitourinary:  Negative for difficulty urinating and dysuria.  Musculoskeletal:  Positive for gait problem. Negative for arthralgias, back pain, joint swelling and myalgias.  Neurological:  Negative for dizziness, tremors, seizures, syncope, facial asymmetry, speech difficulty, weakness, light-headedness, numbness and headaches.  Psychiatric/Behavioral:  Positive for confusion. Negative for agitation, behavioral problems and sleep disturbance.     Immunization History  Administered Date(s) Administered   Influenza Split 11/22/2011,  09/30/2013   Influenza, Quadrivalent, Recombinant, Inj, Pf 10/08/2019, 09/14/2020   Influenza,inj,Quad PF,6+ Mos 10/03/2013   Influenza-Unspecified 08/23/2016   Pneumococcal Conjugate-13 01/09/2016   Pneumococcal Polysaccharide-23 08/12/2012, 10/03/2013   Td 08/16/2010   Zoster, Live 10/07/2006   Pertinent  Health Maintenance Due  Topic Date Due   DEXA SCAN  Never done   INFLUENZA VACCINE  07/16/2022      04/18/2021   11:56 AM  Fall Risk  Patient Fall Risk Level Low fall risk   Functional Status Survey:    Vitals:   09/12/22 1602  Weight: 134 lb 3.2 oz (60.9 kg)   Body mass index is 23.04 kg/m. Wt Readings from Last 3 Encounters:  09/12/22 134 lb 3.2 oz (60.9 kg)  08/21/22 135 lb 8 oz (61.5 kg)  07/22/22 135 lb 6.4 oz (61.4 kg)     Physical Exam Vitals and nursing note reviewed.  Constitutional:      General: She is not in acute distress.    Appearance: She is not diaphoretic.  HENT:     Head: Normocephalic and atraumatic.  Neck:     Vascular: No JVD.  Cardiovascular:  Rate and Rhythm: Normal rate and regular rhythm.     Heart sounds: Murmur heard.  Pulmonary:     Effort: Pulmonary effort is normal. No respiratory distress.     Breath sounds: Normal breath sounds. No wheezing.  Abdominal:     General: Abdomen is flat. Bowel sounds are normal.     Palpations: Abdomen is soft.  Musculoskeletal:     Right lower leg: No edema.     Left lower leg: No edema.  Skin:    General: Skin is warm and dry.  Neurological:     General: No focal deficit present.     Mental Status: She is alert. Mental status is at baseline.  Psychiatric:        Mood and Affect: Mood normal.     Labs reviewed: No results for input(s): "NA", "K", "CL", "CO2", "GLUCOSE", "BUN", "CREATININE", "CALCIUM", "MG", "PHOS" in the last 8760 hours. No results for input(s): "AST", "ALT", "ALKPHOS", "BILITOT", "PROT", "ALBUMIN" in the last 8760 hours. No results for input(s): "WBC",  "NEUTROABS", "HGB", "HCT", "MCV", "PLT" in the last 8760 hours. Lab Results  Component Value Date   TSH 3.170 04/19/2021   No results found for: "HGBA1C" Lab Results  Component Value Date   TRIG 142 11/04/2013    Significant Diagnostic Results in last 30 days:  No results found.  Assessment/Plan  1. DNR (do not resuscitate) Order updated.  - DNR (Do Not Resuscitate)  2. Acquired hypothyroidism ON synthroid, repeat TSH due in Oct  3. Moderate dementia, unspecified dementia type, unspecified whether behavioral, psychotic, or mood disturbance or anxiety (HCC) Tolerating Namenda 10 mg bid Seems to be adjusting well to memory care  4. COPD mixed type ([place]) No symptoms No PFTS for review  5. Essential hypertension, benign Continue losartan   6. Mixed hyperlipidemia Continue crestor  7. Primary insomnia Continue melatonin    Family/ staff Communication: nurse and resident   Labs/tests ordered: TSH  Had CBC BMP Lipid panel, need to be abstracted.    Need vaccine records

## 2022-09-18 ENCOUNTER — Encounter: Payer: Self-pay | Admitting: Family

## 2022-09-18 DIAGNOSIS — E039 Hypothyroidism, unspecified: Secondary | ICD-10-CM | POA: Diagnosis not present

## 2022-09-18 LAB — TSH: TSH: 5.07 (ref 0.41–5.90)

## 2022-09-18 NOTE — Progress Notes (Unsigned)
Facility nurse called to report patient's TSH level results 5.07 currently on levothyroxine 50 mcg tablet daily.  Advised to continue with current dosage and recheck TSH in 2 months.  Continue to monitor

## 2022-09-24 ENCOUNTER — Encounter: Payer: Self-pay | Admitting: Internal Medicine

## 2022-10-10 ENCOUNTER — Non-Acute Institutional Stay (SKILLED_NURSING_FACILITY): Payer: Medicare HMO | Admitting: Adult Health

## 2022-10-10 DIAGNOSIS — J449 Chronic obstructive pulmonary disease, unspecified: Secondary | ICD-10-CM | POA: Diagnosis not present

## 2022-10-10 DIAGNOSIS — N3281 Overactive bladder: Secondary | ICD-10-CM

## 2022-10-10 DIAGNOSIS — I10 Essential (primary) hypertension: Secondary | ICD-10-CM

## 2022-10-10 DIAGNOSIS — F0390 Unspecified dementia without behavioral disturbance: Secondary | ICD-10-CM

## 2022-10-10 DIAGNOSIS — E039 Hypothyroidism, unspecified: Secondary | ICD-10-CM

## 2022-10-11 ENCOUNTER — Encounter: Payer: Self-pay | Admitting: Adult Health

## 2022-10-11 DIAGNOSIS — N3281 Overactive bladder: Secondary | ICD-10-CM | POA: Insufficient documentation

## 2022-10-11 NOTE — Progress Notes (Addendum)
Location:  Occupational psychologist of Service:  SNF (31) Provider:   Cindi Carbon, [place] [phone]   Virgie Dad, MD  Patient Care Team: Virgie Dad, MD as PCP - General (Internal Medicine)  Extended Emergency Contact Information Primary Emergency Contact: Nettleton,Wallace Address: [address], [place] [address] [name] of [place] Phone: [phone] Mobile Phone: [phone] Relation: Spouse Secondary Emergency Contact: Brady,Reanna Address: [address]          [place], [place] of [place] Mobile Phone: [phone] Relation: Daughter  Code Status:  DNR Goals of care: Advanced Directive information    04/18/2021   11:55 AM  Advanced Directives  Does Patient Have a Medical Advance Directive? Yes  Type of Advance Directive Living will;Healthcare Power of Attorney     Chief Complaint  Patient presents with   Medical Management of Chronic Issues    HPI:  Pt is a 87 y.o. female seen today for medical management of chronic diseases.     Moved to memory care after progression of dementia. [facility] 19/30 08/21/22 MMSE 20/30 07/18/22. Pleasant, at times paranoid. No behaviors. Walks with a walker. Has tried sinemet for slow movement and bradykinesia with no benefit. MRI 04/24/21 showed sgeneralized atrophy, ventriculomegaly, cervical spine showed multilevel degenerative changes, Likely central nervous system degenerative disorder, with vascular component. Had a declined after intrabd infection in 2014 with prolonged TPN.  No acute complaints. Spends most of the time in her room.   COPD: hx of smoking, no current symptoms of cough or sob.   HTN: BUN 8.9, Cr 0.78 August 2023  Bps reviewed 140-170s  Hypothyroidism: TSH 5.07 09/18/22 on synthroid  Hx of goiter.   HLD LDL 60 August 2023  Past Medical History:  Diagnosis Date   Anemia    Bowel obstruction (HCC)    Calculus of  gallbladder with acute cholecystitis, without mention of obstruction 10/01/2013   Open chole on 10/26/13. 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    COPD (chronic obstructive pulmonary disease) ([place])    Depression    "sold beach house 01/2012" (10/25/2013)   Gall stones    Goiter    H/O sebaceous cyst    Hepatic cyst 10/01/2013   CT   History of diverticular abscess 09/2013   Hyperlipemia    Hypertension    "was taking RX; took me off it 09/2013" (10/25/2013)   Hypothyroidism    goiter   Insomnia    MCI (mild cognitive impairment)    Osteopenia    Protein calorie malnutrition ([place])    malabsorption   Seasonal allergies    Vitamin B12 deficiency    Past Surgical History:  Procedure Laterality Date   ABCESS DRAINAGE  2014   "had drain put in & stayed for ~ 1 wk to 10 day; just came out ~ 10 days ago" (10/25/2013)   CATARACT EXTRACTION W/ INTRAOCULAR LENS IMPLANT Left ~ 2010   CHOLECYSTECTOMY N/A 10/26/2013   Procedure: ATTEMPTED  LAPAROSCOPIC CONVERTED TO OPEN CHOLECYSTECTOMY WITH INTRAOPERATIVE CHOLANGIOGRAM; REPAIR OF [place]; cholecystoduodenal fistula repair; lysis of adhesions; drainage of pelvic abscess; omental patching of duodenal repair;  Surgeon: Joyice Faster. Cornett, MD;  Location: [place];  Service: General;  Laterality: N/A;   COLON RESECTION  2014   DILATION AND CURETTAGE OF UTERUS     EXCISION MASS NECK Left 10/31/2016   Procedure:  EXCISION CYST LEFT NECK;  Surgeon: Erroll Luna, MD;  Location: [place];  Service: General;  Laterality: Left;  EXCISION CYST LEFT NECK    Allergies  Allergen Reactions   Ambien [Zolpidem Tartrate] Other (See Comments)    Makes her unsteady   Cymbalta [Duloxetine Hcl] Other (See Comments)    "kinda went nuts"   Halcion [Triazolam] Other (See Comments)    "kinda went nuts"    Outpatient Encounter Medications as of 10/10/2022  Medication Sig    Ascorbic Acid (VITAMIN C) 500 MG CAPS See admin instructions.   cholecalciferol (VITAMIN D) 1000 UNITS tablet Take 2,000 Units by mouth daily.   Cyanocobalamin (VITAMIN B 12 PO) Take by mouth.   levothyroxine (SYNTHROID) 50 MCG tablet Take 50 mcg by mouth daily before breakfast.   losartan (COZAAR) 50 MG tablet Take 1 tablet (50 mg total) by mouth daily.   Melatonin 5 MG CAPS Take 5 mg by mouth at bedtime.   memantine (NAMENDA) 5 MG tablet Take 2 tablets (10 mg total) by mouth 2 (two) times daily.   mirabegron ER (MYRBETRIQ) 25 MG TB24 tablet Take 25 mg by mouth daily.   rosuvastatin (CRESTOR) 10 MG tablet Take 10 mg by mouth daily.   No facility-administered encounter medications on file as of 10/10/2022.    Review of Systems  Constitutional:  Negative for activity change, appetite change, chills, diaphoresis, fatigue, fever and unexpected weight change.  HENT:  Negative for congestion.   Respiratory:  Negative for cough, shortness of breath and wheezing.   Cardiovascular:  Negative for chest pain, palpitations and leg swelling.  Gastrointestinal:  Negative for abdominal distention, abdominal pain, constipation and diarrhea.  Genitourinary:  Negative for difficulty urinating and dysuria.  Musculoskeletal:  Positive for gait problem. Negative for arthralgias, back pain, joint swelling and myalgias.  Neurological:  Negative for dizziness, tremors, seizures, syncope, facial asymmetry, speech difficulty, weakness, light-headedness, numbness and headaches.  Psychiatric/Behavioral:  Positive for confusion. Negative for agitation, behavioral problems and sleep disturbance.     Immunization History  Administered Date(s) Administered   Influenza Split 11/22/2011, 09/30/2013   Influenza, Quadrivalent, Recombinant, Inj, Pf 10/08/2019, 09/14/2020   Influenza,inj,Quad PF,6+ Mos 10/03/2013   Influenza-Unspecified 08/23/2016, 09/20/2022   MODERNA COVID-19 SARS-COV-2 PEDS BIVALENT BOOSTER 6Y-11Y  09/28/2021   Moderna SARS-COV2 Booster Vaccination 10/31/2020   Moderna Sars-Covid-2 Vaccination 12/28/2019, 01/26/2020   Pneumococcal Conjugate-13 01/09/2016   Pneumococcal Polysaccharide-23 08/12/2012, 10/03/2013   Td 08/16/2010   Zoster, Live 10/07/2006   Pertinent  Health Maintenance Due  Topic Date Due   DEXA SCAN  Never done   INFLUENZA VACCINE  Completed      04/18/2021   11:56 AM  Fall Risk  Patient Fall Risk Level Low fall risk   Functional Status Survey:    Vitals:   10/11/22 0907  Weight: 132 lb 3.2 oz (60 kg)   Body mass index is 22.69 kg/m. Wt Readings from Last 3 Encounters:  10/11/22 132 lb 3.2 oz (60 kg)  09/12/22 134 lb 3.2 oz (60.9 kg)  08/21/22 135 lb 8 oz (61.5 kg)     Physical Exam Vitals and nursing note reviewed.  Constitutional:      General: She is not in acute distress.    Appearance: She is not diaphoretic.  HENT:     Head: Normocephalic and atraumatic.     Right Ear: There is impacted cerumen.     Left Ear: There is impacted cerumen.  Neck:  Vascular: No JVD.  Cardiovascular:     Rate and Rhythm: Normal rate and regular rhythm.     Heart sounds: Murmur heard.  Pulmonary:     Effort: Pulmonary effort is normal. No respiratory distress.     Breath sounds: Rales present. No wheezing.  Abdominal:     General: Abdomen is flat. Bowel sounds are normal.     Palpations: Abdomen is soft.  Musculoskeletal:     Right lower leg: No edema.     Left lower leg: No edema.  Skin:    General: Skin is warm and dry.  Neurological:     General: No focal deficit present.     Mental Status: She is alert. Mental status is at baseline.  Psychiatric:        Mood and Affect: Mood normal.     Labs reviewed: No results for input(s): "NA", "K", "CL", "CO2", "GLUCOSE", "BUN", "CREATININE", "CALCIUM", "MG", "PHOS" in the last 8760 hours. No results for input(s): "AST", "ALT", "ALKPHOS", "BILITOT", "PROT", "ALBUMIN" in the last 8760 hours. No results  for input(s): "WBC", "NEUTROABS", "HGB", "HCT", "MCV", "PLT" in the last 8760 hours. Lab Results  Component Value Date   TSH 5.07 09/18/2022   No results found for: "HGBA1C" Lab Results  Component Value Date   TRIG 142 11/04/2013    Significant Diagnostic Results in last 30 days:  No results found.  Assessment/Plan  1. Essential hypertension, benign Check bp daily x 1 week manual and report.   2. Acquired hypothyroidism Continue Synthroid, recheck ordered   3. Dementia without behavioral disturbance ([place]) Progressive decline in cognition and physical function c/w the disease. Continue supportive care in the skilled environment. Continue namenda.   4. Overactive bladder Continue myrbetriq.   5. Chronic obstructive pulmonary disease, unspecified COPD type ([place]) No current issues.    Had cerumen impaction but declined debrox  Family/ staff Communication: nurse and resident   Labs/tests ordered:  in matrix, need abstraction  Ordered tdap and shingrix.

## 2022-11-11 ENCOUNTER — Encounter: Payer: Self-pay | Admitting: Internal Medicine

## 2022-11-11 ENCOUNTER — Non-Acute Institutional Stay (SKILLED_NURSING_FACILITY): Payer: Medicare HMO | Admitting: Internal Medicine

## 2022-11-11 DIAGNOSIS — E039 Hypothyroidism, unspecified: Secondary | ICD-10-CM | POA: Diagnosis not present

## 2022-11-11 DIAGNOSIS — N3281 Overactive bladder: Secondary | ICD-10-CM

## 2022-11-11 DIAGNOSIS — I10 Essential (primary) hypertension: Secondary | ICD-10-CM | POA: Diagnosis not present

## 2022-11-11 DIAGNOSIS — E782 Mixed hyperlipidemia: Secondary | ICD-10-CM

## 2022-11-11 DIAGNOSIS — F03B0 Unspecified dementia, moderate, without behavioral disturbance, psychotic disturbance, mood disturbance, and anxiety: Secondary | ICD-10-CM | POA: Diagnosis not present

## 2022-11-11 NOTE — Progress Notes (Unsigned)
Location:  Occupational psychologist of Service:  SNF (31)  Provider: Virgie Dad   Code Status: DNR Goals of Care:     04/18/2021   11:55 AM  Advanced Directives  Does Patient Have a Medical Advance Directive? Yes  Type of Advance Directive Living will;Healthcare Power of Attorney     Chief Complaint  Patient presents with   Chronic Care Management    HPI: Patient is a 87 y.o. female seen today for medical management of chronic diseases.    Lives in Memory unit in [place]  Patient has a history of dementia Recent MMSE score 19/30 in Neurology Office MRI of the brain has showed generalized atrophy ventricular megaly Failed trial of Sinemet in the past   Also has a history of extensive abdominal surgery in 2014 due to gangrenous gallbladder and fistula History of alcohol abuse,, hypertension, hyperlipidemia and urinary incontinence    She  is stable.No new Nursing issues. No Behavior issues Her weight is stable Walks with her walker No Falls Wt Readings from Last 3 Encounters:  11/11/22 134 lb 3.2 oz (60.9 kg)  10/11/22 132 lb 3.2 oz (60 kg)  09/12/22 134 lb 3.2 oz (60.9 kg)    Past Medical History:  Diagnosis Date   Anemia    Bowel obstruction (HCC)    Calculus of gallbladder with acute cholecystitis, without mention of obstruction 10/01/2013   Open chole on 10/26/13. 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    COPD (chronic obstructive pulmonary disease) ([place])    Depression    "sold beach house 01/2012" (10/25/2013)   Gall stones    Goiter    H/O sebaceous cyst    Hepatic cyst 10/01/2013   CT   History of diverticular abscess 09/2013   Hyperlipemia    Hypertension    "was taking RX; took me off it 09/2013" (10/25/2013)   Hypothyroidism    goiter   Insomnia    MCI (mild cognitive impairment)    Osteopenia    Protein calorie malnutrition ([place])    malabsorption   Seasonal  allergies    Vitamin B12 deficiency     Past Surgical History:  Procedure Laterality Date   ABCESS DRAINAGE  2014   "had drain put in & stayed for ~ 1 wk to 10 day; just came out ~ 10 days ago" (10/25/2013)   CATARACT EXTRACTION W/ INTRAOCULAR LENS IMPLANT Left ~ 2010   CHOLECYSTECTOMY N/A 10/26/2013   Procedure: ATTEMPTED  LAPAROSCOPIC CONVERTED TO OPEN CHOLECYSTECTOMY WITH INTRAOPERATIVE CHOLANGIOGRAM; REPAIR OF [place]; cholecystoduodenal fistula repair; lysis of adhesions; drainage of pelvic abscess; omental patching of duodenal repair;  Surgeon: Joyice Faster. Cornett, MD;  Location: [facility];  Service: General;  Laterality: N/A;   COLON RESECTION  2014   DILATION AND CURETTAGE OF UTERUS     EXCISION MASS NECK Left 10/31/2016   Procedure: EXCISION CYST LEFT NECK;  Surgeon: Erroll Luna, MD;  Location: [place];  Service: General;  Laterality: Left;  EXCISION CYST LEFT NECK    Allergies  Allergen Reactions   Ambien [Zolpidem Tartrate] Other (See Comments)    Makes her unsteady   Cymbalta [Duloxetine Hcl] Other (See Comments)    "kinda went nuts"   Halcion [Triazolam] Other (See Comments)    "kinda went nuts"    Outpatient Encounter Medications as of 11/11/2022  Medication Sig   Ascorbic Acid (VITAMIN C) 500 MG CAPS See admin instructions.  cholecalciferol (VITAMIN D) 1000 UNITS tablet Take 2,000 Units by mouth daily.   Cyanocobalamin (VITAMIN B 12 PO) Take by mouth.   levothyroxine (SYNTHROID) 50 MCG tablet Take 50 mcg by mouth daily before breakfast.   losartan (COZAAR) 50 MG tablet Take 1 tablet (50 mg total) by mouth daily.   Melatonin 5 MG CAPS Take 5 mg by mouth at bedtime.   memantine (NAMENDA) 5 MG tablet Take 2 tablets (10 mg total) by mouth 2 (two) times daily.   mirabegron ER (MYRBETRIQ) 25 MG TB24 tablet Take 25 mg by mouth daily.   rosuvastatin (CRESTOR) 10 MG tablet Take 10 mg by mouth daily.   No facility-administered encounter  medications on file as of 11/11/2022.    Review of Systems:  Review of Systems  Unable to perform ROS: Dementia    Health Maintenance  Topic Date Due   Zoster Vaccines- Shingrix (1 of 2) Never done   DEXA SCAN  Never done   Medicare Annual Wellness (AWV)  08/07/2022   COVID-19 Vaccine (5 - 2023-24 season) 08/16/2022   Pneumonia Vaccine 8+ Years old  Completed   INFLUENZA VACCINE  Completed   HPV VACCINES  Aged Out    Physical Exam: Vitals:   11/11/22 1632  BP: 138/82  Pulse: 67  Resp: 18  Temp: 98.3 F (36.8 C)  Weight: 134 lb 3.2 oz (60.9 kg)   Body mass index is 23.04 kg/m. Physical Exam Vitals reviewed.  Constitutional:      Appearance: Normal appearance.  HENT:     Head: Normocephalic.     Nose: Nose normal.     Mouth/Throat:     Mouth: Mucous membranes are moist.     Pharynx: Oropharynx is clear.  Eyes:     Pupils: Pupils are equal, round, and reactive to light.  Cardiovascular:     Rate and Rhythm: Normal rate and regular rhythm.     Pulses: Normal pulses.     Heart sounds: Normal heart sounds. No murmur heard. Pulmonary:     Effort: Pulmonary effort is normal.     Breath sounds: Normal breath sounds.  Abdominal:     General: Abdomen is flat. Bowel sounds are normal.     Palpations: Abdomen is soft.  Musculoskeletal:        General: No swelling.     Cervical back: Neck supple.  Skin:    General: Skin is warm.  Neurological:     General: No focal deficit present.     Mental Status: She is alert.  Psychiatric:        Mood and Affect: Mood normal.        Thought Content: Thought content normal.     Labs reviewed: Basic Metabolic Panel: Recent Labs    09/18/22 0000  TSH 5.07   Liver Function Tests: No results for input(s): "AST", "ALT", "ALKPHOS", "BILITOT", "PROT", "ALBUMIN" in the last 8760 hours. No results for input(s): "LIPASE", "AMYLASE" in the last 8760 hours. No results for input(s): "AMMONIA" in the last 8760 hours. CBC: No  results for input(s): "WBC", "NEUTROABS", "HGB", "HCT", "MCV", "PLT" in the last 8760 hours. Lipid Panel: No results for input(s): "CHOL", "HDL", "LDLCALC", "TRIG", "CHOLHDL", "LDLDIRECT" in the last 8760 hours. No results found for: "HGBA1C"  Procedures since last visit: No results found.  Assessment/Plan 1. Moderate dementia, unspecified dementia type, unspecified whether behavioral, psychotic, or mood disturbance or anxiety ([place]) MMSE 19/30 On Namenda  2. Acquired hypothyroidism TSH normal in 05/23  3. Essential hypertension, benign Losartan  4. Overactive bladder Mybetriq  5. Mixed hyperlipidemia On statin Lipid Done in 8/23 lDL less then 100    Labs/tests ordered:  * No order type specified *  Virgie Dad, MD

## 2022-11-13 DIAGNOSIS — E039 Hypothyroidism, unspecified: Secondary | ICD-10-CM | POA: Diagnosis not present

## 2022-11-13 LAB — TSH: TSH: 4.07 (ref 0.41–5.90)

## 2022-12-12 ENCOUNTER — Encounter: Payer: Self-pay | Admitting: Adult Health

## 2022-12-12 ENCOUNTER — Non-Acute Institutional Stay (SKILLED_NURSING_FACILITY): Payer: Medicare HMO | Admitting: Adult Health

## 2022-12-12 DIAGNOSIS — F0390 Unspecified dementia without behavioral disturbance: Secondary | ICD-10-CM

## 2022-12-12 DIAGNOSIS — U071 COVID-19: Secondary | ICD-10-CM | POA: Diagnosis not present

## 2022-12-12 NOTE — Progress Notes (Signed)
Abstraction for TSH

## 2022-12-12 NOTE — Progress Notes (Signed)
Location:  [facility] Room Number: 312A Place of Service:  SNF [phone]) Provider:  Royal Hawthorn.,NP  Virgie Dad, MD  Patient Care Team: Virgie Dad, MD as PCP - General (Internal Medicine)  Extended Emergency Contact Information Primary Emergency Contact: Spradlin,Wallace Address: [address], [place] [address] [name] of [place] Phone: [phone] Mobile Phone: [phone] Relation: Spouse Secondary Emergency Contact: Brady,Donnika Address: [address]          [place], [place] of [place] Mobile Phone: [phone] Relation: Daughter  Code Status:  DNR Goals of care: Advanced Directive information    12/12/2022   10:43 AM  Advanced Directives  Does Patient Have a Medical Advance Directive? Yes  Type of Advance Directive Out of facility DNR (pink MOST or yellow form)  Does patient want to make changes to medical advance directive? No - Patient declined     Chief Complaint  Patient presents with   Acute Visit    Patient is being seen for Covid    Quality Metric Gaps    Discussed the need for dexa scan and AWV   Immunizations    Discussed the need for shingles, tetanus and upcoming covid vaccine     HPI:  Pt is a 87 y.o. female seen today for an acute visit for covid She developed a temp of 101.3 and nasal congestion and cough on 12/24.  Covid swab positive. Flu negative. Started on Paxlovid for 5 days on 12/25.  Crestor on hold. NO sob, hypoxia, or cyanosis. Vaccinated last 10/21/22 for Covid.  Family requested visit. Nurse reports she is doing well. Only issue is she won't stay on isolation due to underlying dementia. She seems to be other wise in her normal state of health. Eating and drinking well. Normal vitals with no further fever.    Past Medical History:  Diagnosis Date   Anemia    Bowel obstruction (HCC)    Calculus of gallbladder with acute cholecystitis, without  mention of obstruction 10/01/2013   Open chole on 10/26/13. 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    COPD (chronic obstructive pulmonary disease) ([place])    Depression    "sold beach house 01/2012" (10/25/2013)   Gall stones    Goiter    H/O sebaceous cyst    Hepatic cyst 10/01/2013   CT   History of diverticular abscess 09/2013   Hyperlipemia    Hypertension    "was taking RX; took me off it 09/2013" (10/25/2013)   Hypothyroidism    goiter   Insomnia    MCI (mild cognitive impairment)    Osteopenia    Protein calorie malnutrition ([place])    malabsorption   Seasonal allergies    Vitamin B12 deficiency    Past Surgical History:  Procedure Laterality Date   ABCESS DRAINAGE  2014   "had drain put in & stayed for ~ 1 wk to 10 day; just came out ~ 10 days ago" (10/25/2013)   CATARACT EXTRACTION W/ INTRAOCULAR LENS IMPLANT Left ~ 2010   CHOLECYSTECTOMY N/A 10/26/2013   Procedure: ATTEMPTED  LAPAROSCOPIC CONVERTED TO OPEN CHOLECYSTECTOMY WITH INTRAOPERATIVE CHOLANGIOGRAM; REPAIR OF [place]; cholecystoduodenal fistula repair; lysis of adhesions; drainage of pelvic abscess; omental patching of duodenal repair;  Surgeon: Joyice Faster. Cornett, MD;  Location: [facility];  Service: General;  Laterality: N/A;   COLON RESECTION  2014   DILATION AND CURETTAGE  OF UTERUS     EXCISION MASS NECK Left 10/31/2016   Procedure: EXCISION CYST LEFT NECK;  Surgeon: Erroll Luna, MD;  Location: [facility];  Service: General;  Laterality: Left;  EXCISION CYST LEFT NECK    Allergies  Allergen Reactions   Ambien [Zolpidem Tartrate] Other (See Comments)    Makes her unsteady   Cymbalta [Duloxetine Hcl] Other (See Comments)    "kinda went nuts"   Halcion [Triazolam] Other (See Comments)    "kinda went nuts"    Outpatient Encounter Medications as of 12/12/2022  Medication Sig   Ascorbic Acid (VITAMIN C) 500 MG CAPS See admin  instructions.   cholecalciferol (VITAMIN D) 1000 UNITS tablet Take 2,000 Units by mouth daily.   Cyanocobalamin (VITAMIN B 12 PO) Take by mouth.   levothyroxine (SYNTHROID) 50 MCG tablet Take 50 mcg by mouth daily before breakfast.   losartan (COZAAR) 50 MG tablet Take 1 tablet (50 mg total) by mouth daily.   Melatonin 5 MG CAPS Take 5 mg by mouth at bedtime.   memantine (NAMENDA) 5 MG tablet Take 2 tablets (10 mg total) by mouth 2 (two) times daily.   mirabegron ER (MYRBETRIQ) 25 MG TB24 tablet Take 25 mg by mouth daily.   nirmatrelvir & ritonavir (PAXLOVID, 300/100,) 20 x 150 MG & 10 x '100MG'$  TBPK Take 1 tablet by mouth in the morning and at bedtime.   rosuvastatin (CRESTOR) 10 MG tablet Take 10 mg by mouth daily.   SHINGRIX injection Inject 0.5 mLs into the muscle once.   No facility-administered encounter medications on file as of 12/12/2022.    Review of Systems  Constitutional:  Positive for fever. Negative for activity change, appetite change, chills, diaphoresis, fatigue and unexpected weight change.  HENT:  Positive for congestion and rhinorrhea. Negative for sinus pressure, sinus pain, sore throat, trouble swallowing and voice change.   Respiratory:  Positive for cough. Negative for shortness of breath and wheezing.   Cardiovascular:  Negative for chest pain, palpitations and leg swelling.  Gastrointestinal:  Negative for abdominal distention, abdominal pain, constipation and diarrhea.  Genitourinary:  Negative for difficulty urinating and dysuria.  Musculoskeletal:  Positive for gait problem. Negative for arthralgias, back pain, joint swelling and myalgias.  Neurological:  Negative for dizziness, tremors, seizures, syncope, facial asymmetry, speech difficulty, weakness, light-headedness, numbness and headaches.  Psychiatric/Behavioral:  Positive for confusion. Negative for agitation and behavioral problems.     Immunization History  Administered Date(s) Administered   Influenza  Split 11/22/2011, 09/30/2013   Influenza, Quadrivalent, Recombinant, Inj, Pf 10/08/2019, 09/14/2020   Influenza,inj,Quad PF,6+ Mos 10/03/2013   Influenza-Unspecified 08/23/2016, 09/20/2022   MODERNA COVID-19 SARS-COV-2 PEDS BIVALENT BOOSTER 6Y-11Y 09/28/2021   Moderna SARS-COV2 Booster Vaccination 10/31/2020   Moderna Sars-Covid-2 Vaccination 12/28/2019, 01/26/2020, 10/21/2022   Pneumococcal Conjugate-13 01/09/2016   Pneumococcal Polysaccharide-23 08/12/2012, 10/03/2013   Td 08/16/2010   Zoster, Live 10/07/2006   Zoster, Unspecified 10/15/2022   Pertinent  Health Maintenance Due  Topic Date Due   DEXA SCAN  Never done   INFLUENZA VACCINE  Completed      04/18/2021   11:56 AM  Fall Risk  Patient Fall Risk Level Low fall risk   Functional Status Survey:    Vitals:   12/12/22 1036  BP: (!) 140/84  Pulse: 84  Resp: 18  Temp: 98 F (36.7 C)  TempSrc: Temporal  SpO2: 96%  Weight: 134 lb 6.4 oz (61 kg)  Height: '5\' 4"'$  (1.626 m)   Body  mass index is 23.07 kg/m. Physical Exam Vitals and nursing note reviewed.  Constitutional:      General: She is not in acute distress.    Appearance: She is not diaphoretic.  HENT:     Head: Normocephalic and atraumatic.     Nose: Congestion present.     Mouth/Throat:     Mouth: Mucous membranes are moist.     Pharynx: Oropharynx is clear.  Neck:     Vascular: No JVD.  Cardiovascular:     Rate and Rhythm: Normal rate and regular rhythm.     Heart sounds: No murmur heard. Pulmonary:     Effort: Pulmonary effort is normal. No respiratory distress.     Breath sounds: No wheezing.     Comments: Decreased left base Abdominal:     General: Bowel sounds are normal. There is no distension.     Palpations: Abdomen is soft.     Tenderness: There is no abdominal tenderness.  Musculoskeletal:     Cervical back: No rigidity or tenderness.  Lymphadenopathy:     Cervical: No cervical adenopathy.  Skin:    General: Skin is warm and dry.   Neurological:     General: No focal deficit present.     Mental Status: She is alert. Mental status is at baseline.  Psychiatric:     Comments: flat     Labs reviewed: No results for input(s): "NA", "K", "CL", "CO2", "GLUCOSE", "BUN", "CREATININE", "CALCIUM", "MG", "PHOS" in the last 8760 hours. No results for input(s): "AST", "ALT", "ALKPHOS", "BILITOT", "PROT", "ALBUMIN" in the last 8760 hours. No results for input(s): "WBC", "NEUTROABS", "HGB", "HCT", "MCV", "PLT" in the last 8760 hours. Lab Results  Component Value Date   TSH 5.07 09/18/2022   No results found for: "HGBA1C" Lab Results  Component Value Date   TRIG 142 11/04/2013    Significant Diagnostic Results in last 30 days:  No results found.  Assessment/Plan  1. COVID-19 virus infection Mild symptoms On paxlovid Day 3/5 Continue to monitor vitals Isolation for 10 days Husband can come visit after day 5 with proper PPE  2. Dementia without behavioral disturbance ([place]) MMSE 19/30 No change since having covid Doing well in memory care Can't remember to stay on isolation, needs reminders.    Family/ staff Communication: discussed with son by phone   Labs/tests ordered:  NA

## 2023-01-14 ENCOUNTER — Non-Acute Institutional Stay (SKILLED_NURSING_FACILITY): Payer: Medicare HMO | Admitting: Orthopedic Surgery

## 2023-01-14 ENCOUNTER — Encounter: Payer: Self-pay | Admitting: Orthopedic Surgery

## 2023-01-14 DIAGNOSIS — I10 Essential (primary) hypertension: Secondary | ICD-10-CM | POA: Diagnosis not present

## 2023-01-14 DIAGNOSIS — F0390 Unspecified dementia without behavioral disturbance: Secondary | ICD-10-CM | POA: Diagnosis not present

## 2023-01-14 DIAGNOSIS — Z780 Asymptomatic menopausal state: Secondary | ICD-10-CM | POA: Diagnosis not present

## 2023-01-14 DIAGNOSIS — E785 Hyperlipidemia, unspecified: Secondary | ICD-10-CM

## 2023-01-14 DIAGNOSIS — N3281 Overactive bladder: Secondary | ICD-10-CM | POA: Diagnosis not present

## 2023-01-14 DIAGNOSIS — E039 Hypothyroidism, unspecified: Secondary | ICD-10-CM

## 2023-01-14 NOTE — Progress Notes (Signed)
Location:  [place] Room Number: 627/O Place of Service:  SNF [phone]) Provider:  Yvonna Alanis, NP   Virgie Dad, MD  Patient Care Team: Virgie Dad, MD as PCP - General (Internal Medicine)  Extended Emergency Contact Information Primary Emergency Contact: Dotter,Wallace Address: [address], [place] [address] [name] Phone: [phone] Mobile Phone: [phone] Relation: Spouse Secondary Emergency Contact: Brady,Pascale Address: [address]          [place], [place] Mobile Phone: [phone] Relation: Daughter  Code Status:  DNR Goals of care: Advanced Directive information    12/12/2022   10:43 AM  Advanced Directives  Does Patient Have a Medical Advance Directive? Yes  Type of Advance Directive Out of facility DNR (pink MOST or yellow form)  Does patient want to make changes to medical advance directive? No - Patient declined     Chief Complaint  Patient presents with   Medical Management of Chronic Issues    HPI:  Pt is a 88 y.o. female seen today for medical management of chronic diseases.    She currently resides on the memory care unit at [facility]. PMH: HTN, diverticulitis, 2014 extensive abdominal surgery due to ganegrene gallbladder/fistula, hypothyroidism, moderate dementia, OAB, protein calorie malnutrition, and unstable gait.   HTN- BUN/creat 19/069 04/2021, see trends below, remains on losartan Vascular dementia- MOCA 19/30, MRI brain noted moderate/severe atrophy with moderate chronic small vessel ischemic disease, no behaviors per nursing, ambulates on own, remains on Namenda Hypothyroidism- TSH 4.07 11/13/2022, remains on levothyroxine OAB- stable with myrbetriq HLD- LDL unknown, remains on rosuvastatin  Recent blood pressures:  01/17- 138/73  01/10- 120/73  01/03- 120/68  Recent weights:  01/01- 128.8 lbs  12/01- 134.4 lbs  11/01-  134.2 lbs      Past Medical History:  Diagnosis Date   Anemia    Bowel obstruction (HCC)    Calculus of gallbladder with acute cholecystitis, without mention of obstruction 10/01/2013   Open chole on 10/26/13. 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    COPD (chronic obstructive pulmonary disease) ([place])    Depression    "sold beach house 01/2012" (10/25/2013)   Gall stones    Goiter    H/O sebaceous cyst    Hepatic cyst 10/01/2013   CT   History of diverticular abscess 09/2013   Hyperlipemia    Hypertension    "was taking RX; took me off it 09/2013" (10/25/2013)   Hypothyroidism    goiter   Insomnia    MCI (mild cognitive impairment)    Osteopenia    Protein calorie malnutrition ([place])    malabsorption   Seasonal allergies    Vitamin B12 deficiency    Past Surgical History:  Procedure Laterality Date   ABCESS DRAINAGE  2014   "had drain put in & stayed for ~ 1 wk to 10 day; just came out ~ 10 days ago" (10/25/2013)   CATARACT EXTRACTION W/ INTRAOCULAR LENS IMPLANT Left ~ 2010   CHOLECYSTECTOMY N/A 10/26/2013   Procedure: ATTEMPTED  LAPAROSCOPIC CONVERTED TO OPEN CHOLECYSTECTOMY WITH INTRAOPERATIVE CHOLANGIOGRAM; REPAIR OF [place]; cholecystoduodenal fistula repair; lysis of adhesions; drainage of pelvic abscess; omental patching of duodenal repair;  Surgeon: Joyice Faster. Cornett, MD;  Location: [facility];  Service: General;  Laterality: N/A;   COLON RESECTION  2014   DILATION AND CURETTAGE OF UTERUS  EXCISION MASS NECK Left 10/31/2016   Procedure: EXCISION CYST LEFT NECK;  Surgeon: Erroll Luna, MD;  Location: [facility];  Service: General;  Laterality: Left;  EXCISION CYST LEFT NECK    Allergies  Allergen Reactions   Ambien [Zolpidem Tartrate] Other (See Comments)    Makes her unsteady   Cymbalta [Duloxetine Hcl] Other (See Comments)    "kinda went nuts"   Halcion [Triazolam] Other  (See Comments)    "kinda went nuts"    Outpatient Encounter Medications as of 01/14/2023  Medication Sig   Ascorbic Acid (VITAMIN C) 500 MG CAPS See admin instructions.   cholecalciferol (VITAMIN D) 1000 UNITS tablet Take 2,000 Units by mouth daily.   Cyanocobalamin (VITAMIN B 12 PO) Take by mouth.   levothyroxine (SYNTHROID) 50 MCG tablet Take 50 mcg by mouth daily before breakfast.   losartan (COZAAR) 50 MG tablet Take 1 tablet (50 mg total) by mouth daily.   Melatonin 5 MG CAPS Take 5 mg by mouth at bedtime.   memantine (NAMENDA) 5 MG tablet Take 2 tablets (10 mg total) by mouth 2 (two) times daily.   mirabegron ER (MYRBETRIQ) 25 MG TB24 tablet Take 25 mg by mouth daily.   rosuvastatin (CRESTOR) 10 MG tablet Take 10 mg by mouth daily.   SHINGRIX injection Inject 0.5 mLs into the muscle once.   No facility-administered encounter medications on file as of 01/14/2023.    Review of Systems  Unable to perform ROS: Dementia    Immunization History  Administered Date(s) Administered   Influenza Split 11/22/2011, 09/30/2013   Influenza, Quadrivalent, Recombinant, Inj, Pf 10/08/2019, 09/14/2020   Influenza,inj,Quad PF,6+ Mos 10/03/2013   Influenza-Unspecified 08/23/2016, 09/20/2022   MODERNA COVID-19 SARS-COV-2 PEDS BIVALENT BOOSTER 6Y-11Y 09/28/2021   Moderna SARS-COV2 Booster Vaccination 10/31/2020   Moderna Sars-Covid-2 Vaccination 12/28/2019, 01/26/2020, 10/21/2022   Pneumococcal Conjugate-13 01/09/2016   Pneumococcal Polysaccharide-23 08/12/2012, 10/03/2013   Td 08/16/2010   Zoster Recombinat (Shingrix) 10/15/2022   Zoster, Live 10/07/2006   Pertinent  Health Maintenance Due  Topic Date Due   DEXA SCAN  Never done   INFLUENZA VACCINE  Completed      04/18/2021   11:56 AM  Fall Risk  (RETIRED) Patient Fall Risk Level Low fall risk   Functional Status Survey:    Vitals:   01/14/23 1513  BP: 138/73  Pulse: 76  Resp: 18  Temp: (!) 97 F (36.1 C)  SpO2: 96%  Weight:  128 lb 12.8 oz (58.4 kg)  Height: '5\' 4"'$  (1.626 m)   Body mass index is 22.11 kg/m. Physical Exam Vitals reviewed.  Constitutional:      General: She is not in acute distress. HENT:     Head: Normocephalic.     Right Ear: There is no impacted cerumen.     Left Ear: There is no impacted cerumen.     Nose: Nose normal.     Mouth/Throat:     Mouth: Mucous membranes are moist.  Eyes:     General:        Right eye: No discharge.        Left eye: No discharge.  Neck:     Vascular: No carotid bruit.  Cardiovascular:     Rate and Rhythm: Normal rate and regular rhythm.     Pulses: Normal pulses.     Heart sounds: Normal heart sounds.  Pulmonary:     Effort: Pulmonary effort is normal. No respiratory distress.     Breath sounds:  Normal breath sounds. No wheezing.  Abdominal:     General: Bowel sounds are normal. There is no distension.     Palpations: Abdomen is soft.     Tenderness: There is no abdominal tenderness.  Musculoskeletal:     Cervical back: Neck supple.     Right lower leg: No edema.     Left lower leg: No edema.  Lymphadenopathy:     Cervical: No cervical adenopathy.  Skin:    General: Skin is warm and dry.     Capillary Refill: Capillary refill takes less than 2 seconds.  Neurological:     General: No focal deficit present.     Mental Status: She is alert. Mental status is at baseline.     Motor: No weakness.     Gait: Gait normal.  Psychiatric:        Mood and Affect: Mood normal.        Behavior: Behavior normal.     Labs reviewed: No results for input(s): "NA", "K", "CL", "CO2", "GLUCOSE", "BUN", "CREATININE", "CALCIUM", "MG", "PHOS" in the last 8760 hours. No results for input(s): "AST", "ALT", "ALKPHOS", "BILITOT", "PROT", "ALBUMIN" in the last 8760 hours. No results for input(s): "WBC", "NEUTROABS", "HGB", "HCT", "MCV", "PLT" in the last 8760 hours. Lab Results  Component Value Date   TSH 4.07 11/13/2022   No results found for: "HGBA1C" Lab  Results  Component Value Date   TRIG 142 11/04/2013    Significant Diagnostic Results in last 30 days:  No results found.  Assessment/Plan 1. Essential hypertension, benign - controlled - cont losartan - cmp - cbc/diff  2. Dementia without behavioral disturbance ([place]) - no behaviors - doing well in memory care - followed by neurology - [place] 19/30 - cont Namenda  3. Acquired hypothyroidism - TSH stable  - cont Levothyroxine  4. Overactive bladder - cont Myrbetriq  5. Postmenopausal - last DEXA unknown - ambulates on own, recommend DEXA study in 2024 - cont vitamin D  6. Hyperlipidemia, unspecified - LDL unknown - cont rosuvastatin - lipid panel    Family/ staff Communication: plan discussed with patient, husband and nurse  Labs/tests ordered:  cbc/diff, cmp

## 2023-01-15 DIAGNOSIS — E876 Hypokalemia: Secondary | ICD-10-CM | POA: Diagnosis not present

## 2023-01-15 DIAGNOSIS — E43 Unspecified severe protein-calorie malnutrition: Secondary | ICD-10-CM | POA: Diagnosis not present

## 2023-01-15 DIAGNOSIS — I10 Essential (primary) hypertension: Secondary | ICD-10-CM | POA: Diagnosis not present

## 2023-01-15 DIAGNOSIS — R749 Abnormal serum enzyme level, unspecified: Secondary | ICD-10-CM | POA: Diagnosis not present

## 2023-01-15 DIAGNOSIS — E039 Hypothyroidism, unspecified: Secondary | ICD-10-CM | POA: Diagnosis not present

## 2023-01-15 LAB — CBC AND DIFFERENTIAL
HCT: 39 (ref 36–46)
Hemoglobin: 13.2 (ref 12.0–16.0)
Platelets: 202 10*3/uL (ref 150–400)
WBC: 7.8

## 2023-01-15 LAB — HEPATIC FUNCTION PANEL
ALT: 8 U/L (ref 7–35)
AST: 18 (ref 13–35)
Alkaline Phosphatase: 64 (ref 25–125)
Bilirubin, Total: 0.4

## 2023-01-15 LAB — BASIC METABOLIC PANEL
BUN: 12 (ref 4–21)
CO2: 23 — AB (ref 13–22)
Chloride: 106 (ref 99–108)
Creatinine: 0.7 (ref 0.5–1.1)
Glucose: 100
Potassium: 4.2 mEq/L (ref 3.5–5.1)
Sodium: 140 (ref 137–147)

## 2023-01-15 LAB — COMPREHENSIVE METABOLIC PANEL: Calcium: 9 (ref 8.7–10.7)

## 2023-01-15 LAB — CBC: RBC: 4.12 (ref 3.87–5.11)

## 2023-02-19 NOTE — Progress Notes (Unsigned)
No chief complaint on file.  ASSESSMENT AND PLAN  [name] is a 88 y.o. female  1.  Dementia  2.  Gait abnormality  3.  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, symptoms fluctuate -Now living at [facility] unit at [facility] -[facility] 19/30 today, improved from 12/30 -TSH was elevated 17.80, now taking Synthroid -On Namenda 10 mg twice daily -Previous trial of Sinemet 25/100 mg 3 times daily, failed to show significant benefit, it was discontinued -Encouraged PT/OT, remain active, encourage social engagement in activity  -MRI of the brain showed significant generalized atrophy, ventriculomegaly, cervical spine showed multilevel degenerative changes, no evidence of spinal cord compression -Follow-up with me in 6 months or sooner if needed   DIAGNOSTIC DATA (LABS, IMAGING, TESTING)  MRI cervical spine without contrast on Apr 24, 2021: - At C3-4 disc bulging with mild spinal stenosis and mild biforaminal stenosis. - Degenerative spondylosis and disc bulging from C3-4, C4-5 and C5-6 levels.  MRI brain (without), 04/24/2021 -Moderate to severe atrophy and moderate ventriculomegaly on ex vacuo basis. -Moderate chronic small vessel ischemic disease. -No acute findings.  Laboratory evaluation showed normal B12, thyroid panel, vitamin D  Labs from facility 07/23/22 showed creatinine 0.78, normal ALT, AST, TSH elevated 17.80, CBC unremarkable  HISTORICAL  [name] is a 88 year old female, seen in request by her primary care physician Dr.   Forde Dandy, Annie Main, for evaluation of sudden worsening of memory loss, gait abnormality, she is accompanied by her daughter and husband at today's visit on 02/17/2021.  I reviewed and summarized the referring note. Patient had  extensive abdominal surgery in 2014, due to chronic acute on chronic cholecystitis with gangrenous gallbladder, fistula to duodenum from gallbladder, fistula to transverse colon from gallbladder, small bowel obstruction from pelvic adhesive secondary to previous diverticulitis and abscess, she underwent laparoscopic converted to open cholecystectomy, repair of cholecystoduodenal fistula, cholecystocolonic fistula, drainage of residual pelvic abscess, omental patching of duodenum and colonic repair,  She had prolonged postoperative ileus, was kept on TPN due to low p.o. intake, also had a significant postoperative pain, required polypharmacy treatment at that time,  Patient was discharged to nursing home following hospital admission, initially had significant memory loss, poor functional status, gradually recovered over the years, but was never able to bounce back to her presurgical level, had occasional bowel and bladder incontinence,  Before recent acute worsening, she was able to play bridges, walk without assistant, carry on simple conversations, family did reported that she has a long history of excessive alcohol use, can drink up to 2 bottles of wine in 1 day  Around April 20s,  she was noted to have acute worsening, had increased bowel and bladder, profound gait abnormality, worsening memory loss, husband reported that her symptoms happened overnight, but daughter reported that she already has a slow decline baseline  She was taken to emergency room on Apr 18 2021, laboratory evaluation showed no evidence of UTI, borderline troponin, normal WBC, hemoglobin of 15.3, CMP was normal, creatinine of 0.69  Update Apr 26, 2021, She is accompanied by her husband and daughter at today's clinical visit, she was getting a trial of Sinemet 25/100 mg 3 times daily for couple days, husband reported significant improvement, but daughter reported that she sleeps so much after taking medication, has quit giving her  medication  Patient overall  has improved, she can walk better, is more cooperative, but continues to have significant memory loss, gait abnormality, bowel and bladder incontinence,  Laboratory evaluation showed normal B12 CPK, thyroid function, vitamin D  MRI of the brain reviewed with patient and her family: Significant generalized atrophy, ventriculomegaly, profound periventricular white matter disease, which likely explain her significant memory loss, gait abnormality, fluctuation of her functional status  MRI of cervical spine showed multilevel degenerative changes, no evidence of cord compression  UPDATE Jan 07 2022: She lives with her [facility] at [facility], husband is the main caregiver, over the past few months, she has made some progress, has better appetite, able to ambulate without assistant, continue have significant memory loss, difficult to carry on a complete sentence conversation, less urinary/bowel accident  She did spend most of the time in a sitting position, husband is very dedicated to her care  Update August 21, 2022 SS: Has lived at [facility] for 8 years, just moved to memory unit 5 weeks ago. Adjusting well. Notable more interactive than other residents. Can sing all the songs, forgets details about family members, their names. Can spell nearly everything. Does her own dressings, gets help with dressing. Walks with rolling walker. No falls. No complaints. 04/19/21 [place] 12/30, today 19/30.  Update February 20, 2023 SS:   REVIEW OF SYSTEMS:  Full 14 system review of systems performed and notable only for as above All other review of systems were negative.  PHYSICAL EXAM:   There were no vitals filed for this visit.   Not recorded     There is no height or weight on file to calculate BMI.  PHYSICAL EXAMNIATION:  Gen: NAD, conversant, well nourised, well groomed     NEUROLOGICAL EXAM:  MENTAL STATUS: Speech:    Speech is normal;  cooperative, relies on her husband for history, she gives some inaccurate details, quiet     08/21/2022    3:49 PM 04/19/2021    8:00 PM  Montreal Cognitive Assessment   Visuospatial/ Executive (0/5) 4 1  Naming (0/3) 3 3  Attention: Read list of digits (0/2) 2 1  Attention: Read list of letters (0/1) 1 1  Attention: Serial 7 subtraction starting at 100 (0/3) 2 1  Language: Repeat phrase (0/2) 2 2  Language : Fluency (0/1) 1 0  Abstraction (0/2) 1 2  Delayed Recall (0/5) 0 0  Orientation (0/6) 3 1  Total 19 12    CRANIAL NERVES: CN II: Visual fields are full to confrontation. Pupils are round equal and briskly reactive to light. CN III, IV, VI: extraocular movement are normal. No ptosis. CN V: Facial sensation is intact to light touch CN VII: Face is symmetric with normal eye closure  CN VIII: Hearing is normal to causal conversation. CN IX, X: Phonation is normal. CN XI: Head turning and shoulder shrug are intact  MOTOR: Good strength all extremities, no significant bradykinesia, or rigidity noted  REFLEXES: Reflexes were normal, no abnormalities  SENSORY: Intact to light touch  COORDINATION: There is no trunk or limb dysmetria noted.  GAIT/STANCE: She needs push-up to get up from seated position, small shuffling stride, decreased arm swing on the left, tends to pull arm toward her side  ALLERGIES: Allergies  Allergen Reactions   Ambien [Zolpidem Tartrate] Other (See Comments)    Makes her unsteady   Cymbalta [Duloxetine Hcl] Other (See Comments)    "kinda went nuts"   Halcion [Triazolam] Other (See Comments)    "kinda  went nuts"    HOME MEDICATIONS: Current Outpatient Medications  Medication Sig Dispense Refill   Ascorbic Acid (VITAMIN C) 500 MG CAPS See admin instructions.     cholecalciferol (VITAMIN D) 1000 UNITS tablet Take 2,000 Units by mouth daily.     Cyanocobalamin (VITAMIN B 12 PO) Take by mouth.     levothyroxine (SYNTHROID) 50 MCG tablet Take 50  mcg by mouth daily before breakfast.     losartan (COZAAR) 50 MG tablet Take 1 tablet (50 mg total) by mouth daily. 30 tablet 0   Melatonin 5 MG CAPS Take 5 mg by mouth at bedtime.     memantine (NAMENDA) 5 MG tablet Take 2 tablets (10 mg total) by mouth 2 (two) times daily. 60 tablet 11   mirabegron ER (MYRBETRIQ) 25 MG TB24 tablet Take 25 mg by mouth daily.     rosuvastatin (CRESTOR) 10 MG tablet Take 10 mg by mouth daily.     SHINGRIX injection Inject 0.5 mLs into the muscle once.     No current facility-administered medications for this visit.    PAST MEDICAL HISTORY: Past Medical History:  Diagnosis Date   Anemia    Bowel obstruction ([place])    Calculus of gallbladder with acute cholecystitis, without mention of obstruction 10/01/2013   Open chole on 10/26/13. 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    COPD (chronic obstructive pulmonary disease) ([place])    Depression    "sold beach house 01/2012" (10/25/2013)   Gall stones    Goiter    H/O sebaceous cyst    Hepatic cyst 10/01/2013   CT   History of diverticular abscess 09/2013   Hyperlipemia    Hypertension    "was taking RX; took me off it 09/2013" (10/25/2013)   Hypothyroidism    goiter   Insomnia    MCI (mild cognitive impairment)    Osteopenia    Protein calorie malnutrition ([place])    malabsorption   Seasonal allergies    Vitamin B12 deficiency     PAST SURGICAL HISTORY: Past Surgical History:  Procedure Laterality Date   ABCESS DRAINAGE  2014   "had drain put in & stayed for ~ 1 wk to 10 day; just came out ~ 10 days ago" (10/25/2013)   CATARACT EXTRACTION W/ INTRAOCULAR LENS IMPLANT Left ~ 2010   CHOLECYSTECTOMY N/A 10/26/2013   Procedure: ATTEMPTED  LAPAROSCOPIC CONVERTED TO OPEN CHOLECYSTECTOMY WITH INTRAOPERATIVE CHOLANGIOGRAM; REPAIR OF [place]; cholecystoduodenal fistula repair; lysis of adhesions; drainage of pelvic abscess; omental  patching of duodenal repair;  Surgeon: Joyice Faster. Cornett, MD;  Location: [facility];  Service: General;  Laterality: N/A;   COLON RESECTION  2014   DILATION AND CURETTAGE OF UTERUS     EXCISION MASS NECK Left 10/31/2016   Procedure: EXCISION CYST LEFT NECK;  Surgeon: Erroll Luna, MD;  Location: [facility];  Service: General;  Laterality: Left;  EXCISION CYST LEFT NECK    FAMILY HISTORY: Family History  Problem Relation Age of Onset   Prostate cancer Father     SOCIAL HISTORY: Social History   Socioeconomic History   Marital status: Married    Spouse name: [name]   Number of children: 3   Years of education: Not on file   Highest education level: Bachelor's degree (e.g., BA, AB, BS)  Occupational History   Not on file  Tobacco Use   Smoking status: Former    Packs/day: 0.25    Years: 55.00  Total pack years: 13.75    Types: Cigarettes    Quit date: 01/20/2021    Years since quitting: 2.0   Smokeless tobacco: Never  Vaping Use   Vaping Use: Never used  Substance and Sexual Activity   Alcohol use: Not Currently   Drug use: No   Sexual activity: Not Currently    Birth control/protection: Post-menopausal  Other Topics Concern   Not on file  Social History Narrative   04/19/21 Lives with husband   Social Determinants of Health   Financial Resource Strain: Not on file  Food Insecurity: Not on file  Transportation Needs: Not on file  Physical Activity: Not on file  Stress: Not on file  Social Connections: Not on file  Intimate Partner Violence: Not on file    Butler Denmark, Laqueta Jean, [facility] [address] [place], [place] [address] [phone]

## 2023-02-20 ENCOUNTER — Ambulatory Visit (INDEPENDENT_AMBULATORY_CARE_PROVIDER_SITE_OTHER): Payer: Medicare HMO | Admitting: Neurology

## 2023-02-20 VITALS — BP 121/84 | HR 71 | Ht 63.0 in | Wt 130.0 lb

## 2023-02-20 DIAGNOSIS — R269 Unspecified abnormalities of gait and mobility: Secondary | ICD-10-CM

## 2023-02-20 DIAGNOSIS — F0390 Unspecified dementia without behavioral disturbance: Secondary | ICD-10-CM | POA: Diagnosis not present

## 2023-02-20 NOTE — Patient Instructions (Signed)
Great to see you both today! Continue the Namenda  Recommend increasing exercise, activity  See you back in 1 year

## 2023-02-21 ENCOUNTER — Encounter: Payer: Self-pay | Admitting: Adult Health

## 2023-02-21 ENCOUNTER — Non-Acute Institutional Stay (SKILLED_NURSING_FACILITY): Payer: Medicare HMO | Admitting: Adult Health

## 2023-02-21 DIAGNOSIS — N3281 Overactive bladder: Secondary | ICD-10-CM

## 2023-02-21 DIAGNOSIS — J449 Chronic obstructive pulmonary disease, unspecified: Secondary | ICD-10-CM | POA: Diagnosis not present

## 2023-02-21 DIAGNOSIS — F0390 Unspecified dementia without behavioral disturbance: Secondary | ICD-10-CM | POA: Diagnosis not present

## 2023-02-21 DIAGNOSIS — I10 Essential (primary) hypertension: Secondary | ICD-10-CM

## 2023-02-21 DIAGNOSIS — E782 Mixed hyperlipidemia: Secondary | ICD-10-CM | POA: Diagnosis not present

## 2023-02-21 DIAGNOSIS — E039 Hypothyroidism, unspecified: Secondary | ICD-10-CM

## 2023-02-21 NOTE — Progress Notes (Addendum)
Location:  [facility] Room Number: [place] of Service:  ALF [phone]) Provider:  Carlos American. Dewaine Oats, NP   Patient Care Team: Virgie Dad, MD as PCP - General (Internal Medicine)  Extended Emergency Contact Information Primary Emergency Contact: Ernsberger,Wallace Address: [address], [place] [address] [name] of [place] Phone: [phone] Mobile Phone: [phone] Relation: Spouse Secondary Emergency Contact: Brady,Yanna Address: [address]          [place], [place] of [place] Mobile Phone: [phone] Relation: Daughter  Code Status:  DNR Goals of care: Advanced Directive information    02/21/2023   10:06 AM  Advanced Directives  Does Patient Have a Medical Advance Directive? Yes  Type of Paramedic of [place];Out of facility DNR (pink MOST or yellow form)  Does patient want to make changes to medical advance directive? No - Patient declined  Copy of [place] in Chart? Yes - validated most recent copy scanned in chart (See row information)  Pre-existing out of facility DNR order (yellow form or pink MOST form) Yellow form placed in chart (order not valid for inpatient use)     Chief Complaint  Patient presents with   Medical Management of Chronic Issues    Routine visit. Discuss need for DEXA and annual wellness visit or post pone if patient refuses or is not a candidate.     HPI:  Pt is a 88 y.o. female seen today for medical management of chronic diseases.    Resides in memory care after progression of dementia. MOCA 20/30 02/20/23  Walks with a walker no aggression.  Has tried sinemet for slow movement and bradykinesia with no benefit.  MRI 04/24/21 showed sgeneralized atrophy, ventriculomegaly, cervical spine showed multilevel degenerative changes, Likely central nervous system degenerative disorder, with vascular component.Had a declined  after intrabd infection in 2014 with prolonged TPN.   Saw neurology 02/20/23 no new orders, stay on namenda.   SBP 130-150 Gained 6 lbs in the past few months, no edema.  Weight: 134 lb 3.2 oz (60.9 kg)  Wt Readings from Last 3 Encounters:  02/21/23 134 lb 3.2 oz (60.9 kg)  02/20/23 130 lb (59 kg)  01/14/23 128 lb 12.8 oz (58.4 kg)     COPD: hx of smoking, no current symptoms of cough or sob.   HTN: BUN 12, Cr 0.22 July 2022   Hypothyroidism:  Lab Results  Component Value Date   TSH 4.07 11/13/2022   Hx of goiter.   HLD LDL 60 August 2023 Past Medical History:  Diagnosis Date   Anemia    Bowel obstruction (HCC)    Calculus of gallbladder with acute cholecystitis, without mention of obstruction 10/01/2013   Open chole on 10/26/13. 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    COPD (chronic obstructive pulmonary disease) ([place])    Depression    "sold beach house 01/2012" (10/25/2013)   Gall stones    Goiter    H/O sebaceous cyst    Hepatic cyst 10/01/2013   CT   History of diverticular abscess 09/2013   Hyperlipemia    Hypertension    "was taking RX; took me off it 09/2013" (10/25/2013)   Hypothyroidism    goiter   Insomnia    MCI (mild cognitive impairment)    Osteopenia    Protein calorie malnutrition (HCC)    malabsorption  Seasonal allergies    Vitamin B12 deficiency    Past Surgical History:  Procedure Laterality Date   ABCESS DRAINAGE  2014   "had drain put in & stayed for ~ 1 wk to 10 day; just came out ~ 10 days ago" (10/25/2013)   CATARACT EXTRACTION W/ INTRAOCULAR LENS IMPLANT Left ~ 2010   CHOLECYSTECTOMY N/A 10/26/2013   Procedure: ATTEMPTED  LAPAROSCOPIC CONVERTED TO OPEN CHOLECYSTECTOMY WITH INTRAOPERATIVE CHOLANGIOGRAM; REPAIR OF [place]; cholecystoduodenal fistula repair; lysis of adhesions; drainage of pelvic abscess; omental patching of duodenal repair;  Surgeon: Joyice Faster.  Cornett, MD;  Location: [facility];  Service: General;  Laterality: N/A;   COLON RESECTION  2014   DILATION AND CURETTAGE OF UTERUS     EXCISION MASS NECK Left 10/31/2016   Procedure: EXCISION CYST LEFT NECK;  Surgeon: Erroll Luna, MD;  Location: [facility];  Service: General;  Laterality: Left;  EXCISION CYST LEFT NECK    Allergies  Allergen Reactions   Ambien [Zolpidem Tartrate] Other (See Comments)    Makes her unsteady   Cymbalta [Duloxetine Hcl] Other (See Comments)    "kinda went nuts"   Halcion [Triazolam] Other (See Comments)    "kinda went nuts"    Outpatient Encounter Medications as of 02/21/2023  Medication Sig   Ascorbic Acid (VITAMIN C) 500 MG CAPS Take 500 mg by mouth daily.   Cholecalciferol (VITAMIN D3) 50 MCG (2000 UT) TABS Take 1 tablet by mouth daily.   Cyanocobalamin (VITAMIN B 12 PO) Take by mouth.   levothyroxine (SYNTHROID) 50 MCG tablet Take 50 mcg by mouth daily before breakfast.   losartan (COZAAR) 50 MG tablet Take 1 tablet (50 mg total) by mouth daily.   Melatonin 5 MG CAPS Take 5 mg by mouth at bedtime.   memantine (NAMENDA) 5 MG tablet Take 2 tablets (10 mg total) by mouth 2 (two) times daily.   mirabegron ER (MYRBETRIQ) 25 MG TB24 tablet Take 25 mg by mouth daily.   rosuvastatin (CRESTOR) 10 MG tablet Take 10 mg by mouth daily.   [DISCONTINUED] cholecalciferol (VITAMIN D) 1000 UNITS tablet Take 2,000 Units by mouth daily.   [DISCONTINUED] SHINGRIX injection Inject 0.5 mLs into the muscle once.   No facility-administered encounter medications on file as of 02/21/2023.    Review of Systems  Constitutional:  Negative for activity change, appetite change, chills, diaphoresis, fatigue, fever and unexpected weight change.  HENT:  Negative for congestion.   Respiratory:  Negative for cough, shortness of breath and wheezing.   Cardiovascular:  Negative for chest pain, palpitations and leg swelling.  Gastrointestinal:  Negative for abdominal  distention, abdominal pain, constipation and diarrhea.  Genitourinary:  Negative for difficulty urinating and dysuria.  Musculoskeletal:  Positive for gait problem (walker). Negative for arthralgias, back pain, joint swelling and myalgias.  Neurological:  Negative for dizziness, tremors, seizures, syncope, facial asymmetry, speech difficulty, weakness, light-headedness, numbness and headaches.  Psychiatric/Behavioral:  Negative for agitation, behavioral problems and confusion.     Immunization History  Administered Date(s) Administered   Influenza Split 11/22/2011, 09/30/2013   Influenza, Quadrivalent, Recombinant, Inj, Pf 10/08/2019, 09/14/2020   Influenza,inj,Quad PF,6+ Mos 10/03/2013   Influenza-Unspecified 08/23/2016, 09/20/2022   MODERNA COVID-19 SARS-COV-2 PEDS BIVALENT BOOSTER 6Y-11Y 09/28/2021   Moderna SARS-COV2 Booster Vaccination 10/31/2020   Moderna Sars-Covid-2 Vaccination 12/28/2019, 01/26/2020, 10/21/2022   Pneumococcal Conjugate-13 01/09/2016   Pneumococcal Polysaccharide-23 08/12/2012, 10/03/2013   Td 08/16/2010   Tdap 01/16/2023   Zoster Recombinat (Shingrix) 10/15/2022, 01/16/2023  Zoster, Live 10/07/2006   Pertinent  Health Maintenance Due  Topic Date Due   DEXA SCAN  Never done   INFLUENZA VACCINE  Completed      04/18/2021   11:56 AM  Fall Risk  (RETIRED) Patient Fall Risk Level Low fall risk   Functional Status Survey:    Vitals:   02/21/23 0940  BP: 137/83  Pulse: 89  Resp: 16  Temp: 98 F (36.7 C)  SpO2: 95%  Weight: 134 lb 3.2 oz (60.9 kg)  Height: '5\' 4"'$  (1.626 m)   Body mass index is 23.04 kg/m. Physical Exam Vitals and nursing note reviewed.  Constitutional:      General: She is not in acute distress.    Appearance: She is not diaphoretic.  HENT:     Head: Normocephalic and atraumatic.  Neck:     Vascular: No JVD.  Cardiovascular:     Rate and Rhythm: Normal rate and regular rhythm.     Heart sounds: No murmur heard. Pulmonary:      Effort: Pulmonary effort is normal. No respiratory distress.     Breath sounds: Normal breath sounds. No wheezing.  Abdominal:     General: Bowel sounds are normal. There is no distension.     Palpations: Abdomen is soft.     Tenderness: There is no abdominal tenderness.  Musculoskeletal:     Cervical back: No rigidity or tenderness.  Lymphadenopathy:     Cervical: No cervical adenopathy.  Skin:    General: Skin is warm and dry.  Neurological:     General: No focal deficit present.     Mental Status: She is alert. Mental status is at baseline.  Psychiatric:        Mood and Affect: Mood normal.     Labs reviewed: Recent Labs    01/15/23 0000  NA 140  K 4.2  CL 106  CO2 23*  BUN 12  CREATININE 0.7  CALCIUM 9.0   Recent Labs    01/15/23 0000  AST 18  ALT 8  ALKPHOS 64   Recent Labs    01/15/23 0000  WBC 7.8  HGB 13.2  HCT 39  PLT 202   Lab Results  Component Value Date   TSH 4.07 11/13/2022   No results found for: "HGBA1C" Lab Results  Component Value Date   TRIG 142 11/04/2013    Significant Diagnostic Results in last 30 days:  No results found.  Assessment/Plan  1. Essential hypertension, benign Controlled Continue losartan  2. Mixed hyperlipidemia Continue crestor  3. COPD mixed type ([place]) No current symptoms  4. Acquired hypothyroidism Continue synthroid  5. Dementia without behavioral disturbance ([place]) Progressive decline in cognition and physical function c/w the disease. Continue supportive care in the skilled environment. Followed by neurology on namenda.   6. Overactive bladder Continue myrbetriq   Family/ staff Communication: resident  Labs/tests ordered:  NA   Family declined dexa scan

## 2023-03-06 ENCOUNTER — Non-Acute Institutional Stay (INDEPENDENT_AMBULATORY_CARE_PROVIDER_SITE_OTHER): Payer: Medicare HMO | Admitting: Adult Health

## 2023-03-06 ENCOUNTER — Encounter: Payer: Self-pay | Admitting: Adult Health

## 2023-03-06 DIAGNOSIS — Z0000 Encounter for general adult medical examination without abnormal findings: Secondary | ICD-10-CM | POA: Diagnosis not present

## 2023-03-06 NOTE — Patient Instructions (Signed)
[name] , Thank you for taking time to come for your Medicare Wellness Visit. I appreciate your ongoing commitment to your health goals. Please review the following plan we discussed and let me know if I can assist you in the future.   Screening recommendations/referrals: Colonoscopy aged out  Mammogram aged out Bone Density declined by [facility] Recommended yearly ophthalmology/optometry visit for glaucoma screening and checkup Recommended yearly dental visit for hygiene and checkup  Vaccinations: Influenza vaccine- due annually in September/October Pneumococcal vaccine up to date.  Tdap vaccine up to date Shingles vaccine up to date.     Advanced directives: reviewed  Conditions/risks identified: fall risk, uses walker .  Next appointment: 1 year   Preventive Care 51 Years and Older, Female Preventive care refers to lifestyle choices and visits with your health care provider that can promote health and wellness. What does preventive care include? A yearly physical exam. This is also called an annual well check. Dental exams once or twice a year. Routine eye exams. Ask your health care provider how often you should have your eyes checked. Personal lifestyle choices, including: Daily care of your teeth and gums. Regular physical activity. Eating a healthy diet. Avoiding tobacco and drug use. Limiting alcohol use. Practicing safe sex. Taking low-dose aspirin every day. Taking vitamin and mineral supplements as recommended by your health care provider. What happens during an annual well check? The services and screenings done by your health care provider during your annual well check will depend on your age, overall health, lifestyle risk factors, and family history of disease. Counseling  Your health care provider may ask you questions about your: Alcohol use. Tobacco use. Drug use. Emotional well-being. Home and relationship well-being. Sexual activity. Eating  habits. History of falls. Memory and ability to understand (cognition). Work and work Statistician. Reproductive health. Screening  You may have the following tests or measurements: Height, weight, and BMI. Blood pressure. Lipid and cholesterol levels. These may be checked every 5 years, or more frequently if you are over 72 years old. Skin check. Lung cancer screening. You may have this screening every year starting at age 25 if you have a 30-pack-year history of smoking and currently smoke or have quit within the past 15 years. Fecal occult blood test (FOBT) of the stool. You may have this test every year starting at age 64. Flexible sigmoidoscopy or colonoscopy. You may have a sigmoidoscopy every 5 years or a colonoscopy every 10 years starting at age 91. Hepatitis C blood test. Hepatitis B blood test. Sexually transmitted disease (STD) testing. Diabetes screening. This is done by checking your blood sugar (glucose) after you have not eaten for a while (fasting). You may have this done every 1-3 years. Bone density scan. This is done to screen for osteoporosis. You may have this done starting at age 31. Mammogram. This may be done every 1-2 years. Talk to your health care provider about how often you should have regular mammograms. Talk with your health care provider about your test results, treatment options, and if necessary, the need for more tests. Vaccines  Your health care provider may recommend certain vaccines, such as: Influenza vaccine. This is recommended every year. Tetanus, diphtheria, and acellular pertussis (Tdap, Td) vaccine. You may need a Td booster every 10 years. Zoster vaccine. You may need this after age 78. Pneumococcal 13-valent conjugate (PCV13) vaccine. One dose is recommended after age 35. Pneumococcal polysaccharide (PPSV23) vaccine. One dose is recommended after age 74. Talk to  your health care provider about which screenings and vaccines you need and how  often you need them. This information is not intended to replace advice given to you by your health care provider. Make sure you discuss any questions you have with your health care provider. Document Released: 12/29/2015 Document Revised: 08/21/2016 Document Reviewed: 10/03/2015 Elsevier Interactive Patient Education  2017 [employer] Prevention in the Home Falls can cause injuries. They can happen to people of all ages. There are many things you can do to make your home safe and to help prevent falls. What can I do on the outside of my home? Regularly fix the edges of walkways and driveways and fix any cracks. Remove anything that might make you trip as you walk through a door, such as a raised step or threshold. Trim any bushes or trees on the path to your home. Use bright outdoor lighting. Clear any walking paths of anything that might make someone trip, such as rocks or tools. Regularly check to see if handrails are loose or broken. Make sure that both sides of any steps have handrails. Any raised decks and porches should have guardrails on the edges. Have any leaves, snow, or ice cleared regularly. Use sand or salt on walking paths during winter. Clean up any spills in your garage right away. This includes oil or grease spills. What can I do in the bathroom? Use night lights. Install grab bars by the toilet and in the tub and shower. Do not use towel bars as grab bars. Use non-skid mats or decals in the tub or shower. If you need to sit down in the shower, use a plastic, non-slip stool. Keep the floor dry. Clean up any water that spills on the floor as soon as it happens. Remove soap buildup in the tub or shower regularly. Attach bath mats securely with double-sided non-slip rug tape. Do not have throw rugs and other things on the floor that can make you trip. What can I do in the bedroom? Use night lights. Make sure that you have a light by your bed that is easy to  reach. Do not use any sheets or blankets that are too big for your bed. They should not hang down onto the floor. Have a firm chair that has side arms. You can use this for support while you get dressed. Do not have throw rugs and other things on the floor that can make you trip. What can I do in the kitchen? Clean up any spills right away. Avoid walking on wet floors. Keep items that you use a lot in easy-to-reach places. If you need to reach something above you, use a strong step stool that has a grab bar. Keep electrical cords out of the way. Do not use floor polish or wax that makes floors slippery. If you must use wax, use non-skid floor wax. Do not have throw rugs and other things on the floor that can make you trip. What can I do with my stairs? Do not leave any items on the stairs. Make sure that there are handrails on both sides of the stairs and use them. Fix handrails that are broken or loose. Make sure that handrails are as long as the stairways. Check any carpeting to make sure that it is firmly attached to the stairs. Fix any carpet that is loose or worn. Avoid having throw rugs at the top or bottom of the stairs. If you do have throw rugs, attach  them to the floor with carpet tape. Make sure that you have a light switch at the top of the stairs and the bottom of the stairs. If you do not have them, ask someone to add them for you. What else can I do to help prevent falls? Wear shoes that: Do not have high heels. Have rubber bottoms. Are comfortable and fit you well. Are closed at the toe. Do not wear sandals. If you use a stepladder: Make sure that it is fully opened. Do not climb a closed stepladder. Make sure that both sides of the stepladder are locked into place. Ask someone to hold it for you, if possible. Clearly mark and make sure that you can see: Any grab bars or handrails. First and last steps. Where the edge of each step is. Use tools that help you move  around (mobility aids) if they are needed. These include: Canes. Walkers. Scooters. Crutches. Turn on the lights when you go into a dark area. Replace any light bulbs as soon as they burn out. Set up your furniture so you have a clear path. Avoid moving your furniture around. If any of your floors are uneven, fix them. If there are any pets around you, be aware of where they are. Review your medicines with your doctor. Some medicines can make you feel dizzy. This can increase your chance of falling. Ask your doctor what other things that you can do to help prevent falls. This information is not intended to replace advice given to you by your health care provider. Make sure you discuss any questions you have with your health care provider. Document Released: 09/28/2009 Document Revised: 05/09/2016 Document Reviewed: 01/06/2015 Elsevier Interactive Patient Education  2017 [employer].

## 2023-03-06 NOTE — Progress Notes (Signed)
Subjective:   [name] is a 88 y.o. female who presents for Medicare Annual (Subsequent) preventive examination at [facility] in skilled memory care.   Review of Systems     Cardiac Risk Factors include: advanced age (>43men, >73 women)     Objective:    Today's Vitals   03/06/23 1435  Weight: 134 lb 3.2 oz (60.9 kg)   Body mass index is 23.04 kg/m.     02/21/2023   10:06 AM 12/12/2022   10:43 AM 04/18/2021   11:55 AM 10/31/2016   12:05 PM 10/24/2016   10:53 AM 09/06/2014    2:22 PM 10/26/2013    1:00 AM  Advanced Directives  Does Patient Have a Medical Advance Directive? Yes Yes Yes No No Yes Patient does not have advance directive;Patient would like information  Type of Advance Directive [place];Out of facility DNR (pink MOST or yellow form) Out of facility DNR (pink MOST or yellow form) Living will;Healthcare Power of [place]   Does patient want to make changes to medical advance directive? No - Patient declined No - Patient declined       Copy of [place] in Chart? Yes - validated most recent copy scanned in chart (See row information)        Pre-existing out of facility DNR order (yellow form or pink MOST form) Yellow form placed in chart (order not valid for inpatient use)      No    Current Medications (verified) Outpatient Encounter Medications as of 03/06/2023  Medication Sig   Ascorbic Acid (VITAMIN C) 500 MG CAPS Take 500 mg by mouth daily.   Cholecalciferol (VITAMIN D3) 50 MCG (2000 UT) TABS Take 1 tablet by mouth daily.   Cyanocobalamin (VITAMIN B 12 PO) Take by mouth.   levothyroxine (SYNTHROID) 50 MCG tablet Take 50 mcg by mouth daily before breakfast.   losartan (COZAAR) 50 MG tablet Take 1 tablet (50 mg total) by mouth daily.   Melatonin 5 MG CAPS Take 5 mg by mouth at bedtime.   memantine (NAMENDA) 5 MG tablet Take 2 tablets (10 mg total) by mouth 2 (two)  times daily.   mirabegron ER (MYRBETRIQ) 25 MG TB24 tablet Take 25 mg by mouth daily.   rosuvastatin (CRESTOR) 10 MG tablet Take 10 mg by mouth daily.   No facility-administered encounter medications on file as of 03/06/2023.    Allergies (verified) Ambien [zolpidem tartrate], Cymbalta [duloxetine hcl], and Halcion [triazolam]   History: Past Medical History:  Diagnosis Date   Anemia    Bowel obstruction ([place])    Calculus of gallbladder with acute cholecystitis, without mention of obstruction 10/01/2013   Open chole on 10/26/13. 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    COPD (chronic obstructive pulmonary disease) ([place])    Depression    "sold beach house 01/2012" (10/25/2013)   Gall stones    Goiter    H/O sebaceous cyst    Hepatic cyst 10/01/2013   CT   History of diverticular abscess 09/2013   Hyperlipemia    Hypertension    "was taking RX; took me off it 09/2013" (10/25/2013)   Hypothyroidism    goiter   Insomnia    MCI (mild cognitive impairment)    Osteopenia    Protein calorie malnutrition (HCC)    malabsorption   Seasonal allergies    Vitamin B12 deficiency    Past Surgical  History:  Procedure Laterality Date   ABCESS DRAINAGE  2014   "had drain put in & stayed for ~ 1 wk to 10 day; just came out ~ 10 days ago" (10/25/2013)   CATARACT EXTRACTION W/ INTRAOCULAR LENS IMPLANT Left ~ 2010   CHOLECYSTECTOMY N/A 10/26/2013   Procedure: ATTEMPTED  LAPAROSCOPIC CONVERTED TO OPEN CHOLECYSTECTOMY WITH INTRAOPERATIVE CHOLANGIOGRAM; REPAIR OF [place]; cholecystoduodenal fistula repair; lysis of adhesions; drainage of pelvic abscess; omental patching of duodenal repair;  Surgeon: Joyice Faster. Cornett, MD;  Location: [facility] [place];  Service: General;  Laterality: N/A;   COLON RESECTION  2014   DILATION AND CURETTAGE OF UTERUS     EXCISION MASS NECK Left 10/31/2016   Procedure: EXCISION CYST LEFT NECK;  Surgeon:  Erroll Luna, MD;  Location: [place];  Service: General;  Laterality: Left;  EXCISION CYST LEFT NECK   Family History  Problem Relation Age of Onset   Prostate cancer Father    Social History   Socioeconomic History   Marital status: Married    Spouse name: [name]   Number of children: 3   Years of education: Not on file   Highest education level: Bachelor's degree (e.g., BA, AB, BS)  Occupational History   Not on file  Tobacco Use   Smoking status: Former    Packs/day: 0.25    Years: 55.00    Additional pack years: 0.00    Total pack years: 13.75    Types: Cigarettes    Quit date: 01/20/2021    Years since quitting: 2.1   Smokeless tobacco: Never  Vaping Use   Vaping Use: Never used  Substance and Sexual Activity   Alcohol use: Not Currently   Drug use: No   Sexual activity: Not Currently    Birth control/protection: Post-menopausal  Other Topics Concern   Not on file  Social History Narrative   04/19/21 Lives with husband   Social Determinants of Health   Financial Resource Strain: Not on file  Food Insecurity: Not on file  Transportation Needs: Not on file  Physical Activity: Not on file  Stress: Not on file  Social Connections: Not on file    Tobacco Counseling Counseling given: Not Answered   Clinical Intake:  Pre-visit preparation completed: No  Pain : No/denies pain     BMI - recorded: 23 Nutritional Status: BMI of 19-24  Normal Nutritional Risks: None Diabetes: No  How often do you need to have someone help you when you read instructions, pamphlets, or other written materials from your doctor or pharmacy?: 3 - Sometimes What is the last grade level you completed in school?: [place]  Interpreter Needed?: No  Information entered by :: Royal Hawthorn NP   Activities of Daily Living    03/06/2023    3:18 PM 03/06/2023    2:40 PM  In your present state of health, do you have any difficulty performing the  following activities:  Hearing?  0  Vision?  0  Difficulty concentrating or making decisions?  1  Walking or climbing stairs?  1  Dressing or bathing?  1  Doing errands, shopping?  1  Preparing Food and eating ?  Y  Using the Toilet?  N  In the past six months, have you accidently leaked urine? Y   Do you have problems with loss of bowel control? Y N  Managing your Medications?  N  Managing your Finances?  Y  Housekeeping or managing your Housekeeping?  Darreld Mclean  Patient Care Team: Virgie Dad, MD as PCP - General (Internal Medicine)  Indicate any recent Medical Services you may have received from other than [facility] providers in the past year (date may be approximate).     Assessment:   This is a routine wellness examination for Carlo.  Hearing/Vision screen No results found.  Dietary issues and exercise activities discussed: Current Exercise Habits: Structured exercise class, Type of exercise: strength training/weights, Time (Minutes): 30, Frequency (Times/Week): 2, Weekly Exercise (Minutes/Week): 60, Intensity: Mild, Exercise limited by: orthopedic condition(s)   Goals Addressed             This Visit's Progress    Social and Functional Skills Optimized       Evidence-based guidance:  Assess level of social support; promote maintaining links with family, friends and community to reduce social isolation.  Assess level of function related to basic activities of daily living that include eating, dressing, bathing, as well as instrumental activities of daily living such as shopping, managing finances and use of devices.  Encourage continuation of daily life components such as self-care, home maintenance, financial management, volunteer activities, education opportunities and hobbies.  Refer to occupational or physical therapy to develop comprehensive rehabilitation plan to improve or maintain activities of daily living; consider inclusion of endurance, balance and  resistance-training.  Consider complementary therapy such as yoga, music, gardening, outdoor activities, aromatherapy and tai chi.   Notes:        Depression Screen    03/06/2023    2:40 PM  PHQ 2/9 Scores  PHQ - 2 Score 0    Fall Risk    03/06/2023    3:17 PM  [place] in the past year? 0  Number falls in past yr: 0  Injury with Fall? 0  Risk for fall due to : Impaired balance/gait  Follow up Falls evaluation completed    FALL RISK PREVENTION PERTAINING TO THE HOME:  Any stairs in or around the home? No  If so, are there any without handrails? No  Home free of loose throw rugs in walkways, pet beds, electrical cords, etc? Yes  Adequate lighting in your home to reduce risk of falls? Yes   ASSISTIVE DEVICES UTILIZED TO PREVENT FALLS:  Life alert? No  Use of a cane, walker or w/c? Yes  Grab bars in the bathroom? Yes  Shower chair or bench in shower? Yes  Elevated toilet seat or a handicapped toilet? Yes   TIMED UP AND GO:  Was the test performed? Yes .  Length of time to ambulate 10 feet: 10 sec.   Gait slow and steady without use of assistive device  Cognitive Function:    03/06/2023    2:36 PM  MMSE - Mini Mental State Exam  Orientation to time 1  Orientation to Place 5  Registration 3  Attention/ Calculation 5  Recall 0  Language- name 2 objects 2  Language- repeat 1  Language- follow 3 step command 3  Language- read & follow direction 1  Write a sentence 1  Copy design 1  Total score 23      02/20/2023    2:04 PM 08/21/2022    3:49 PM 04/19/2021    8:00 PM  Montreal Cognitive Assessment   Visuospatial/ Executive (0/5) 2 4 1   Naming (0/3) 2 3 3   Attention: Read list of digits (0/2) 1 2 1   Attention: Read list of letters (0/1) 1 1 1   Attention: Serial 7  subtraction starting at 100 (0/3) 3 2 1   Language: Repeat phrase (0/2) 2 2 2   Language : Fluency (0/1) 1 1 0  Abstraction (0/2) 1 1 2   Delayed Recall (0/5) 5 0 0  Orientation (0/6) 2 3 1    Total 20 19 12   Adjusted Score (based on education) 20        Immunizations Immunization History  Administered Date(s) Administered   Influenza Split 11/22/2011, 09/30/2013   Influenza, Quadrivalent, Recombinant, Inj, Pf 10/08/2019, 09/14/2020   Influenza,inj,Quad PF,6+ Mos 10/03/2013   Influenza-Unspecified 08/23/2016, 09/20/2022   MODERNA COVID-19 SARS-COV-2 PEDS BIVALENT BOOSTER 6Y-11Y 09/28/2021   Moderna SARS-COV2 Booster Vaccination 10/31/2020   Moderna Sars-Covid-2 Vaccination 12/28/2019, 01/26/2020, 10/21/2022   Pneumococcal Conjugate-13 01/09/2016   Pneumococcal Polysaccharide-23 08/12/2012, 10/03/2013   Td 08/16/2010   Tdap 01/16/2023   Zoster Recombinat (Shingrix) 10/15/2022, 01/16/2023   Zoster, Live 10/07/2006    TDAP status: Up to date  Flu Vaccine status: Up to date  Pneumococcal vaccine status: Up to date  Covid-19 vaccine status: Completed vaccines  Qualifies for Shingles Vaccine? Yes   Zostavax completed Yes   Shingrix Completed?: Yes  Screening Tests Health Maintenance  Topic Date Due   COVID-19 Vaccine (6 - 2023-24 season) 08/15/2023 (Originally 12/16/2022)   DEXA SCAN  02/21/2024 (Originally 09/16/2002)   Medicare Annual Wellness (AWV)  03/05/2024   DTaP/Tdap/Td (3 - Td or Tdap) 01/16/2033   Pneumonia Vaccine 58+ Years old  Completed   INFLUENZA VACCINE  Completed   Zoster Vaccines- Shingrix  Completed   HPV VACCINES  Aged Out    Health Maintenance  There are no preventive care reminders to display for this patient.   Colorectal cancer screening: No longer required.   Mammogram status: No longer required due to age.  Bone Density status: Completed not able to find. . Results reflect: Bone density results: NORMAL. Repeat every not able to find years. Pt daughter declined dexa scan   Lung Cancer Screening: (Low Dose CT Chest recommended if Age 83-80 years, 30 pack-year currently smoking OR have quit w/in 15years.) does not qualify.    Lung Cancer Screening Referral: na  Additional Screening:  Hepatitis C Screening: does not qualify; Completed na  Vision Screening: Recommended annual ophthalmology exams for early detection of glaucoma and other disorders of the eye. Is the patient up to date with their annual eye exam?  No  Who is the provider or what is the name of the office in which the patient attends annual eye exams? na If pt is not established with a provider, would they like to be referred to a provider to establish care? No .   Dental Screening: Recommended annual dental exams for proper oral hygiene  Community Resource Referral / Chronic Care Management: CRR required this visit?  No   CCM required this visit?  No      Plan:     I have personally reviewed and noted the following in the patient's chart:   Medical and social history Use of alcohol, tobacco or illicit drugs  Current medications and supplements including opioid prescriptions. Patient is not currently taking opioid prescriptions. Functional ability and status Nutritional status Physical activity Advanced directives List of other physicians Hospitalizations, surgeries, and ER visits in previous 12 months Vitals Screenings to include cognitive, depression, and falls Referrals and appointments  In addition, I have reviewed and discussed with patient certain preventive protocols, quality metrics, and best practice recommendations. A written personalized care plan for preventive services as  well as general preventive health recommendations were provided to patient.     Royal Hawthorn, NP   03/06/2023   Nurse Notes: na

## 2023-03-24 ENCOUNTER — Encounter: Payer: Self-pay | Admitting: Internal Medicine

## 2023-03-24 ENCOUNTER — Non-Acute Institutional Stay (SKILLED_NURSING_FACILITY): Payer: Medicare HMO | Admitting: Internal Medicine

## 2023-03-24 DIAGNOSIS — J449 Chronic obstructive pulmonary disease, unspecified: Secondary | ICD-10-CM

## 2023-03-24 DIAGNOSIS — I10 Essential (primary) hypertension: Secondary | ICD-10-CM | POA: Diagnosis not present

## 2023-03-24 DIAGNOSIS — E039 Hypothyroidism, unspecified: Secondary | ICD-10-CM

## 2023-03-24 DIAGNOSIS — F0390 Unspecified dementia without behavioral disturbance: Secondary | ICD-10-CM

## 2023-03-24 DIAGNOSIS — E782 Mixed hyperlipidemia: Secondary | ICD-10-CM

## 2023-03-24 DIAGNOSIS — N3281 Overactive bladder: Secondary | ICD-10-CM

## 2023-03-24 NOTE — Progress Notes (Unsigned)
[facility] Room Number: 312A Place of Service:  ALF [phone] Provider:  Mahlon GammonGupta, Tevis Dunavan L, MD   Mahlon GammonGupta, Sharea Guinther L, MD  Patient Care Team: Mahlon GammonGupta, Andersyn Fragoso L, MD as PCP - General (Internal Medicine)  Extended Emergency Contact Information Primary Emergency Contact: Palin,Wallace Address: [address]          [place], [place] [id number] [place] Home Phone: [phone] Mobile Phone: [phone] Relation: Spouse Secondary Emergency Contact: Brady,Willamina Address: [address]          [place], [place] Mobile Phone: [phone] Relation: Daughter  Code Status:  DNR Goals of care: Advanced Directive information    03/24/2023   11:51 AM  Advanced Directives  Does Patient Have a Medical Advance Directive? Yes  Type of Estate agentAdvance Directive Healthcare Power of [place]orney;Out of facility DNR (pink MOST or yellow form)  Does patient want to make changes to medical advance directive? No - Patient declined  Copy of Healthcare Power of Attorney in Chart? Yes - validated most recent copy scanned in chart (See row information)  Pre-existing out of facility DNR order (yellow form or pink MOST form) Yellow form placed in chart (order not valid for inpatient use)     Chief Complaint  Patient presents with   Medical Management of Chronic Issues    Routine Visit     HPI:  Pt is a 88 y.o. female seen today for medical management of chronic diseases.    [place]   Patient has a history of dementia Recent MMSE score 19/30 in Neurology Office Recent MOCA 20/30 MRI of the brain has showed generalized atrophy ventricular megaly Failed trial of Sinemet in the past   Also has a history of extensive abdominal surgery in 2014 due to gangrenous gallbladder and fistula History of alcohol abuse,, hypertension, hyperlipidemia and urinary incontinence     She is stable. No new Nursing issues. No Behavior  issues Her weight is stable Walks with her walker No Falls Wt Readings from Last 3 Encounters:  03/24/23 136 lb 6.4 oz (61.9 kg)  03/06/23 134 lb 3.2 oz (60.9 kg)  02/21/23 134 lb 3.2 oz (60.9 kg)    Past Medical History:  Diagnosis Date   Anemia    Bowel obstruction    Calculus of gallbladder with acute cholecystitis, without mention of obstruction 10/01/2013   Open chole on 10/26/13. 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    COPD (chronic obstructive pulmonary disease)    Depression    "sold beach house 01/2012" (10/25/2013)   Gall stones    Goiter    H/O sebaceous cyst    Hepatic cyst 10/01/2013   CT   History of diverticular abscess 09/2013   Hyperlipemia    Hypertension    "was taking RX; took me off it 09/2013" (10/25/2013)   Hypothyroidism    goiter   Insomnia    MCI (mild cognitive impairment)    Osteopenia    Protein calorie malnutrition    malabsorption   Seasonal allergies    Vitamin B12 deficiency    Past Surgical History:  Procedure Laterality Date   ABCESS DRAINAGE  2014   "had drain put in & stayed for ~ 1 wk to 10 day; just came out ~ 10 days ago" (10/25/2013)   CATARACT EXTRACTION W/ INTRAOCULAR LENS IMPLANT Left ~ 2010   CHOLECYSTECTOMY N/A 10/26/2013   Procedure: ATTEMPTED  LAPAROSCOPIC CONVERTED TO OPEN CHOLECYSTECTOMY WITH INTRAOPERATIVE CHOLANGIOGRAM; REPAIR OF CHOLECOLONIC FISTULA; cholecystoduodenal fistula repair; lysis of adhesions; drainage of pelvic abscess; omental patching of duodenal repair;  Surgeon: Clovis Pu. Cornett, MD;  Location: [facility];  Service: General;  Laterality: N/A;   COLON RESECTION  2014   DILATION AND CURETTAGE OF UTERUS     EXCISION MASS NECK Left 10/31/2016   Procedure: EXCISION CYST LEFT NECK;  Surgeon: Harriette Bouillon, MD;  Location: [facility];  Service: General;  Laterality: Left;  EXCISION CYST LEFT NECK    Allergies  Allergen Reactions    Ambien [Zolpidem Tartrate] Other (See Comments)    Makes her unsteady   Cymbalta [Duloxetine Hcl] Other (See Comments)    "kinda went nuts"   Halcion [Triazolam] Other (See Comments)    "kinda went nuts"    Outpatient Encounter Medications as of 03/24/2023  Medication Sig   Ascorbic Acid (VITAMIN C) 500 MG CAPS Take 500 mg by mouth daily.   bisacodyl (DULCOLAX) 10 MG suppository Place 10 mg rectally as needed for moderate constipation.   Cholecalciferol (VITAMIN D3) 50 MCG (2000 UT) TABS Take 1 tablet by mouth daily.   Cyanocobalamin (VITAMIN B 12 PO) Take by mouth.   levothyroxine (SYNTHROID) 50 MCG tablet Take 50 mcg by mouth daily before breakfast.   losartan (COZAAR) 50 MG tablet Take 1 tablet (50 mg total) by mouth daily.   magnesium hydroxide (MILK OF MAGNESIA) 400 MG/5ML suspension Take 5 mLs by mouth daily as needed for mild constipation.   Melatonin 5 MG CAPS Take 5 mg by mouth at bedtime.   memantine (NAMENDA) 5 MG tablet Take 2 tablets (10 mg total) by mouth 2 (two) times daily.   mirabegron ER (MYRBETRIQ) 25 MG TB24 tablet Take 25 mg by mouth daily.   rosuvastatin (CRESTOR) 10 MG tablet Take 10 mg by mouth daily.   No facility-administered encounter medications on file as of 03/24/2023.    Review of Systems  Unable to perform ROS: Dementia    Immunization History  Administered Date(s) Administered   Influenza Split 11/22/2011, 09/30/2013   Influenza, Quadrivalent, Recombinant, Inj, Pf 10/08/2019, 09/14/2020   Influenza,inj,Quad PF,6+ Mos 10/03/2013   Influenza-Unspecified 08/23/2016, 09/20/2022   MODERNA COVID-19 SARS-COV-2 PEDS BIVALENT BOOSTER 6Y-11Y 09/28/2021   Moderna SARS-COV2 Booster Vaccination 10/31/2020   Moderna Sars-Covid-2 Vaccination 12/28/2019, 01/26/2020, 10/21/2022   Pneumococcal Conjugate-13 01/09/2016   Pneumococcal Polysaccharide-23 08/12/2012, 10/03/2013   Td 08/16/2010   Tdap 01/16/2023   Zoster Recombinat (Shingrix) 10/15/2022, 01/16/2023    Zoster, Live 10/07/2006   Pertinent  Health Maintenance Due  Topic Date Due   DEXA SCAN  02/21/2024 (Originally 09/16/2002)   INFLUENZA VACCINE  07/17/2023      04/18/2021   11:56 AM 03/06/2023    3:17 PM  Fall Risk  Falls in the past year?  0  Was there an injury with Fall?  0  Fall Risk Category Calculator  0  (RETIRED) Patient Fall Risk Level Low fall risk   Patient at Risk for Falls Due to  Impaired balance/gait  Fall risk Follow up  Falls evaluation completed   Functional Status Survey:    Vitals:   03/24/23 1147  BP: (!) 169/75  Pulse: 80  Resp: 16  Temp: (!) 96.8 F (36 C)  TempSrc: Temporal  SpO2: 96%  Weight: 136 lb 6.4 oz (61.9 kg)  Height: 5\' 4"  (1.626 m)   Body mass index is 23.41 kg/m. Physical Exam Vitals reviewed.  Constitutional:      Appearance: Normal appearance.  HENT:     Head: Normocephalic.     Nose: Nose normal.     Mouth/Throat:     Mouth: Mucous membranes are moist.     Pharynx: Oropharynx is clear.  Eyes:     Pupils: Pupils are equal, round, and reactive to light.  Cardiovascular:     Rate and Rhythm: Normal rate and regular rhythm.     Pulses: Normal pulses.     Heart sounds: Normal heart sounds. No murmur heard. Pulmonary:     Effort: Pulmonary effort is normal.     Breath sounds: Normal breath sounds.  Abdominal:     General: Abdomen is flat. Bowel sounds are normal.     Palpations: Abdomen is soft.  Musculoskeletal:        General: No swelling.     Cervical back: Neck supple.  Skin:    General: Skin is warm.  Neurological:     General: No focal deficit present.     Mental Status: She is alert.  Psychiatric:        Mood and Affect: Mood normal.        Thought Content: Thought content normal.     Labs reviewed: Recent Labs    01/15/23 0000  NA 140  K 4.2  CL 106  CO2 23*  BUN 12  CREATININE 0.7  CALCIUM 9.0   Recent Labs    01/15/23 0000  AST 18  ALT 8  ALKPHOS 64   Recent Labs    01/15/23 0000  WBC  7.8  HGB 13.2  HCT 39  PLT 202   Lab Results  Component Value Date   TSH 4.07 11/13/2022   No results found for: "HGBA1C" Lab Results  Component Value Date   TRIG 142 11/04/2013    Significant Diagnostic Results in last 30 days:  No results found.  Assessment/Plan 1. Essential hypertension, benign Some BP readings are high but mostly Good Will write to check the BP QD  2. Mixed hyperlipidemia On statin LDL 60 in 8/23  3. COPD mixed type   4. Acquired hypothyroidism TSH was normal in 11/23  5. Dementia without behavioral disturbance Doing well in Memory unit  No Behaviors On Namenda  6. Overactive bladder Myrbetriq    Family/ staff Communication:   Labs/tests ordered:  Lipid Panel

## 2023-04-25 ENCOUNTER — Non-Acute Institutional Stay (SKILLED_NURSING_FACILITY): Payer: Medicare HMO | Admitting: Adult Health

## 2023-04-25 ENCOUNTER — Encounter: Payer: Self-pay | Admitting: Adult Health

## 2023-04-25 DIAGNOSIS — I10 Essential (primary) hypertension: Secondary | ICD-10-CM

## 2023-04-25 DIAGNOSIS — E782 Mixed hyperlipidemia: Secondary | ICD-10-CM | POA: Diagnosis not present

## 2023-04-25 DIAGNOSIS — F0390 Unspecified dementia without behavioral disturbance: Secondary | ICD-10-CM

## 2023-04-25 DIAGNOSIS — F5101 Primary insomnia: Secondary | ICD-10-CM

## 2023-04-25 DIAGNOSIS — N3281 Overactive bladder: Secondary | ICD-10-CM | POA: Diagnosis not present

## 2023-04-25 DIAGNOSIS — R269 Unspecified abnormalities of gait and mobility: Secondary | ICD-10-CM | POA: Diagnosis not present

## 2023-04-25 DIAGNOSIS — E559 Vitamin D deficiency, unspecified: Secondary | ICD-10-CM

## 2023-04-25 NOTE — Progress Notes (Signed)
Location:  [facility] Room Number: [facility]/312/A Place of Service:  SNF [phone]) Provider:  Fletcher Anon, NP Mahlon Gammon, MD  Patient Care Team: Mahlon Gammon, MD as PCP - General (Internal Medicine)  Extended Emergency Contact Information Primary Emergency Contact: Evitt,Wallace Address: [address]          [place], [place] [address] [place] Home Phone: [phone] Mobile Phone: [phone] Relation: Spouse Secondary Emergency Contact: Brady,Lekeya Address: [address]          [place], [place] Mobile Phone: [phone] Relation: Daughter  Code Status:  DNR Goals of care: Advanced Directive information    04/25/2023   10:56 AM  Advanced Directives  Does Patient Have a Medical Advance Directive? Yes  Type of Estate agent of [place];Out of facility DNR (pink MOST or yellow form)  Does patient want to make changes to medical advance directive? No - Patient declined  Copy of Healthcare Power of Attorney in Chart? Yes - validated most recent copy scanned in chart (See row information)  Pre-existing out of facility DNR order (yellow form or pink MOST form) Yellow form placed in chart (order not valid for inpatient use)     Chief Complaint  Patient presents with   Medical Management of Chronic Issues    HPI:  [name] is a 88 y.o. female seen today for medical management of chronic diseases.   Resides in memory care after progression of dementia. MOCA 20/30 02/20/23 Walks with a walker no aggression. Has a shuffling gait.  Has tried sinemet for slow movement and bradykinesia with no benefit.  MRI 04/24/21 showed sgeneralized atrophy, ventriculomegaly, cervical spine showed multilevel degenerative changes, Likely central nervous system degenerative disorder, with vascular component.Had a declined after intrabd infection in 2014 with prolonged TPN.  Update: [name] has no acute  complaints. Had one fall last month no injuries. Weight stable. BP intermittently elevated on automatic cuff but when manual bps are done for the past two months they are WNL.   Weight: 138 lb 3.2 oz (62.7 kg)  Wt Readings from Last 3 Encounters:  04/25/23 138 lb 3.2 oz (62.7 kg)  03/24/23 136 lb 6.4 oz (61.9 kg)  03/06/23 134 lb 3.2 oz (60.9 kg)     COPD: hx of smoking, no current symptoms of cough or sob.   HTN: BUN 12, Cr 0.7 01/15/23   Hypothyroidism:  Lab Results  Component Value Date   TSH 4.07 11/13/2022   Hx of goiter.   HLD LDL 60 August 2023  Past Medical History:  Diagnosis Date   Anemia    Bowel obstruction (HCC)    Calculus of gallbladder with acute cholecystitis, without mention of obstruction 10/01/2013   Open chole on 10/26/13. 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    COPD (chronic obstructive pulmonary disease) (HCC)    Depression    "sold beach house 01/2012" (10/25/2013)   Gall stones    Goiter    H/O sebaceous cyst    Hepatic cyst 10/01/2013   CT   History of diverticular abscess 09/2013   Hyperlipemia    Hypertension    "was taking RX; took me off it 09/2013" (10/25/2013)   Hypothyroidism    goiter   Insomnia    MCI (mild cognitive impairment)    Osteopenia    Protein calorie malnutrition (HCC)    malabsorption   Seasonal allergies    Vitamin B12 deficiency  Past Surgical History:  Procedure Laterality Date   ABCESS DRAINAGE  2014   "had drain put in & stayed for ~ 1 wk to 10 day; just came out ~ 10 days ago" (10/25/2013)   CATARACT EXTRACTION W/ INTRAOCULAR LENS IMPLANT Left ~ 2010   CHOLECYSTECTOMY N/A 10/26/2013   Procedure: ATTEMPTED  LAPAROSCOPIC CONVERTED TO OPEN CHOLECYSTECTOMY WITH INTRAOPERATIVE CHOLANGIOGRAM; REPAIR OF CHOLECOLONIC FISTULA; cholecystoduodenal fistula repair; lysis of adhesions; drainage of pelvic abscess; omental patching of duodenal repair;  Surgeon:  Clovis Pu. Cornett, MD;  [facility]: [facility];  Service: General;  Laterality: N/A;   COLON RESECTION  2014   DILATION AND CURETTAGE OF UTERUS     EXCISION MASS NECK Left 10/31/2016   Procedure: EXCISION CYST LEFT NECK;  Surgeon: Harriette Bouillon, MD;  Location: [facility];  Service: General;  Laterality: Left;  EXCISION CYST LEFT NECK    Allergies  Allergen Reactions   Ambien [Zolpidem Tartrate] Other (See Comments)    Makes her unsteady   Cymbalta [Duloxetine Hcl] Other (See Comments)    "kinda went nuts"   Halcion [Triazolam] Other (See Comments)    "kinda went nuts"    Outpatient Encounter Medications as of 04/25/2023  Medication Sig   Ascorbic Acid (VITAMIN C) 500 MG CAPS Take 500 mg by mouth daily.   Cholecalciferol (VITAMIN D3) 50 MCG (2000 UT) TABS Take 1 tablet by mouth daily.   Cyanocobalamin (VITAMIN B 12 PO) Take by mouth.   levothyroxine (SYNTHROID) 50 MCG tablet Take 50 mcg by mouth daily before breakfast.   losartan (COZAAR) 50 MG tablet Take 1 tablet (50 mg total) by mouth daily.   Melatonin 5 MG CAPS Take 5 mg by mouth at bedtime.   memantine (NAMENDA) 5 MG tablet Take 2 tablets (10 mg total) by mouth 2 (two) times daily.   mirabegron ER (MYRBETRIQ) 25 MG TB24 tablet Take 25 mg by mouth daily.   rosuvastatin (CRESTOR) 10 MG tablet Take 10 mg by mouth daily.   bisacodyl (DULCOLAX) 10 MG suppository Place 10 mg rectally as needed for moderate constipation.   magnesium hydroxide (MILK OF MAGNESIA) 400 MG/5ML suspension Take 5 mLs by mouth daily as needed for mild constipation.   No facility-administered encounter medications on file as of 04/25/2023.    Review of Systems  Constitutional:  Negative for activity change, appetite change, chills, diaphoresis, fatigue, fever and unexpected weight change.  HENT:  Negative for congestion.   Respiratory:  Negative for cough, shortness of breath and wheezing.   Cardiovascular:  Negative for chest pain, palpitations and  leg swelling.  Gastrointestinal:  Negative for abdominal distention, abdominal pain, constipation and diarrhea.  Genitourinary:  Negative for difficulty urinating and dysuria.  Musculoskeletal:  Positive for gait problem. Negative for arthralgias, back pain, joint swelling and myalgias.  Neurological:  Negative for dizziness, tremors, seizures, syncope, facial asymmetry, speech difficulty, weakness, light-headedness, numbness and headaches.  Psychiatric/Behavioral:  Positive for confusion. Negative for agitation and behavioral problems.     Immunization History  Administered Date(s) Administered   Influenza Split 11/22/2011, 09/30/2013   Influenza, Quadrivalent, Recombinant, Inj, Pf 10/08/2019, 09/14/2020   Influenza,inj,Quad PF,6+ Mos 10/03/2013   Influenza-Unspecified 08/23/2016, 09/20/2022   MODERNA COVID-19 SARS-COV-2 PEDS BIVALENT BOOSTER 6Y-11Y 09/28/2021   Moderna SARS-COV2 Booster Vaccination 10/31/2020   Moderna Sars-Covid-2 Vaccination 12/28/2019, 01/26/2020, 10/21/2022   Pneumococcal Conjugate-13 01/09/2016   Pneumococcal Polysaccharide-23 08/12/2012, 10/03/2013   Td 08/16/2010   Tdap 01/16/2023   Zoster Recombinat (Shingrix) 10/15/2022, 01/16/2023  Zoster, Live 10/07/2006   Pertinent  Health Maintenance Due  Topic Date Due   DEXA SCAN  02/21/2024 (Originally 09/16/2002)   INFLUENZA VACCINE  07/17/2023      04/18/2021   11:56 AM 03/06/2023    3:17 PM 04/25/2023   10:58 AM  Fall Risk  Falls in the past year?  0 0  Was there an injury with Fall?  0 0  Fall Risk Category Calculator  0 0  (RETIRED) Patient Fall Risk Level Low fall risk    Patient at Risk for Falls Due to  Impaired balance/gait History of fall(s);Impaired balance/gait;Impaired mobility  Fall risk Follow up  Falls evaluation completed Falls evaluation completed   Functional Status Survey:    Vitals:   04/25/23 1104 04/25/23 1106  BP: (!) 151/79 (!) 160/76  Pulse: 69   Resp: 16   Temp: (!) 97.3 F  (36.3 C)   SpO2: 94%   Weight: 138 lb 3.2 oz (62.7 kg)   Height: 5\' 4"  (1.626 m)    Body mass index is 23.72 kg/m. Physical Exam Vitals and nursing note reviewed.  Constitutional:      General: She is not in acute distress.    Appearance: She is not diaphoretic.  HENT:     Head: Normocephalic and atraumatic.  Neck:     Vascular: No JVD.  Cardiovascular:     Rate and Rhythm: Normal rate and regular rhythm.     Pulses:          Dorsalis pedis pulses are 2+ on the right side and 2+ on the left side.     Heart sounds: No murmur heard. Pulmonary:     Effort: Pulmonary effort is normal. No respiratory distress.     Breath sounds: Normal breath sounds. No wheezing.  Musculoskeletal:     Right lower leg: No edema.     Left lower leg: No edema.  Skin:    General: Skin is warm and dry.  Neurological:     Mental Status: She is alert and oriented to person, place, and time.     Labs reviewed: Recent Labs    01/15/23 0000  NA 140  K 4.2  CL 106  CO2 23*  BUN 12  CREATININE 0.7  CALCIUM 9.0   Recent Labs    01/15/23 0000  AST 18  ALT 8  ALKPHOS 64   Recent Labs    01/15/23 0000  WBC 7.8  HGB 13.2  HCT 39  PLT 202   Lab Results  Component Value Date   TSH 4.07 11/13/2022   No results found for: "HGBA1C" Lab Results  Component Value Date   TRIG 142 11/04/2013    Significant Diagnostic Results in last 30 days:  No results found.  Assessment/Plan  1. Gait abnormality Shuffling gait Hx of dementia Recommend walker use at all times.   2. Essential hypertension, benign Manual bps reveal stable numbers Recommend manual bps Continue losartan   3. Dementia without behavioral disturbance (HCC) Progressive decline in cognition and physical function c/w the disease. Continue supportive care in the skilled environment. Followed by neurology [facility].   4. Primary insomnia No sleeping issues reported.  Continue melatonin   5. Vitamin D  deficiency Last level 42,7 2022 On Vit d supplementation   6. Overactive bladder Continue myrbetriq   7. Mixed hyperlipidemia Continue crestor    Family/ staff Communication: nurse  Labs/tests ordered:  NA

## 2023-05-22 ENCOUNTER — Non-Acute Institutional Stay (SKILLED_NURSING_FACILITY): Payer: Medicare HMO | Admitting: Adult Health

## 2023-05-22 ENCOUNTER — Encounter: Payer: Self-pay | Admitting: Adult Health

## 2023-05-22 DIAGNOSIS — F0390 Unspecified dementia without behavioral disturbance: Secondary | ICD-10-CM | POA: Diagnosis not present

## 2023-05-22 DIAGNOSIS — E039 Hypothyroidism, unspecified: Secondary | ICD-10-CM | POA: Diagnosis not present

## 2023-05-22 DIAGNOSIS — I10 Essential (primary) hypertension: Secondary | ICD-10-CM

## 2023-05-22 DIAGNOSIS — E782 Mixed hyperlipidemia: Secondary | ICD-10-CM

## 2023-05-22 DIAGNOSIS — N3281 Overactive bladder: Secondary | ICD-10-CM

## 2023-05-22 NOTE — Progress Notes (Signed)
Location:  [facility] Room Number: Memory Care 312A Place of Service:  SNF [phone]) Provider:  Peggye Ley, NP  PCP: Mahlon Gammon, MD  Patient Care Team: Mahlon Gammon, MD as PCP - General (Internal Medicine)  Extended Emergency Contact Information Primary Emergency Contact: Kimmet,Wallace Address: [address]          [place], [place] [address] [place] Home Phone: [phone] Mobile Phone: [phone] Relation: Spouse Secondary Emergency Contact: Brady,Danyell Address: [address]          [place], [place] Mobile Phone: [phone] Relation: Daughter  Code Status:  DNR Goals of care: Advanced Directive information    05/22/2023    2:38 PM  Advanced Directives  Does Patient Have a Medical Advance Directive? Yes  Type of Estate agent of [place];Out of facility DNR (pink MOST or yellow form)  Does patient want to make changes to medical advance directive? No - Patient declined  Copy of Healthcare Power of Attorney in Chart? Yes - validated most recent copy scanned in chart (See row information)     Chief Complaint  Patient presents with   Medical Management of Chronic Issues    Medical Management of Chronic Issues.     HPI:  Pt is a 88 y.o. female seen today for medical management of chronic diseases.    Resides in memory care due to dementia. MOCA 20/30 02/20/23 Walks with a walker no aggression. Has a shuffling gait.  Has tried sinemet for slow movement and bradykinesia with no benefit.  MRI 04/24/21 showed sgeneralized atrophy, ventriculomegaly, cervical spine showed multilevel degenerative changes, Likely central nervous system degenerative disorder, with vascular component.Had a declined after intrabd infection in 2014 with prolonged TPN.  Update:  Wt Readings from Last 3 Encounters:  05/22/23 136 lb 9.6 oz (62 kg)  04/25/23 138 lb 3.2 oz (62.7 kg)  03/24/23  136 lb 6.4 oz (61.9 kg)     COPD: hx of smoking, no current symptoms of cough or sob.   HTN: BUN 12, Cr 0.7 01/15/23  BPs systolic 150s-160s on automatic cuff, 130-140 manual  Hypothyroidism:  Lab Results  Component Value Date   TSH 4.07 11/13/2022   Hx of goiter.   HLD LDL 60 August 2023 Past Medical History:  Diagnosis Date   Anemia    Bowel obstruction (HCC)    Calculus of gallbladder with acute cholecystitis, without mention of obstruction 10/01/2013   Open chole on 10/26/13. 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    COPD (chronic obstructive pulmonary disease) (HCC)    Depression    "sold beach house 01/2012" (10/25/2013)   Gall stones    Goiter    H/O sebaceous cyst    Hepatic cyst 10/01/2013   CT   History of diverticular abscess 09/2013   Hyperlipemia    Hypertension    "was taking RX; took me off it 09/2013" (10/25/2013)   Hypothyroidism    goiter   Insomnia    MCI (mild cognitive impairment)    Osteopenia    Protein calorie malnutrition (HCC)    malabsorption   Seasonal allergies    Vitamin B12 deficiency    Past Surgical History:  Procedure Laterality Date   ABCESS DRAINAGE  2014   "had drain put in & stayed for ~ 1 wk to 10 day; just came out ~ 10 days ago" (10/25/2013)   CATARACT EXTRACTION W/ INTRAOCULAR LENS  IMPLANT Left ~ 2010   CHOLECYSTECTOMY N/A 10/26/2013   Procedure: ATTEMPTED  LAPAROSCOPIC CONVERTED TO OPEN CHOLECYSTECTOMY WITH INTRAOPERATIVE CHOLANGIOGRAM; REPAIR OF CHOLECOLONIC FISTULA; cholecystoduodenal fistula repair; lysis of adhesions; drainage of pelvic abscess; omental patching of duodenal repair;  Surgeon: Clovis Pu. Cornett, MD;  Location: [facility];  Service: General;  Laterality: N/A;   COLON RESECTION  2014   DILATION AND CURETTAGE OF UTERUS     EXCISION MASS NECK Left 10/31/2016   Procedure: EXCISION CYST LEFT NECK;  Surgeon: Harriette Bouillon, MD;  Location: [facility];  Service: General;  Laterality: Left;  EXCISION CYST LEFT NECK    Allergies  Allergen Reactions   Ambien [Zolpidem Tartrate] Other (See Comments)    Makes her unsteady   Cymbalta [Duloxetine Hcl] Other (See Comments)    "kinda went nuts"   Halcion [Triazolam] Other (See Comments)    "kinda went nuts"    Outpatient Encounter Medications as of 05/22/2023  Medication Sig   Ascorbic Acid (VITAMIN C) 500 MG CAPS Take 500 mg by mouth daily.   Cholecalciferol (VITAMIN D3) 50 MCG (2000 UT) TABS Take 1 tablet by mouth daily.   Cyanocobalamin (VITAMIN B 12 PO) Take 2,000 mcg by mouth daily.   levothyroxine (SYNTHROID) 50 MCG tablet Take 50 mcg by mouth daily before breakfast.   losartan (COZAAR) 50 MG tablet Take 1 tablet (50 mg total) by mouth daily.   Melatonin 5 MG CAPS Take 5 mg by mouth at bedtime.   memantine (NAMENDA) 5 MG tablet Take 2 tablets (10 mg total) by mouth 2 (two) times daily.   mirabegron ER (MYRBETRIQ) 25 MG TB24 tablet Take 25 mg by mouth daily.   rosuvastatin (CRESTOR) 10 MG tablet Take 10 mg by mouth daily.   [DISCONTINUED] bisacodyl (DULCOLAX) 10 MG suppository Place 10 mg rectally as needed for moderate constipation.   [DISCONTINUED] magnesium hydroxide (MILK OF MAGNESIA) 400 MG/5ML suspension Take 5 mLs by mouth daily as needed for mild constipation.   No facility-administered encounter medications on file as of 05/22/2023.    Review of Systems  Constitutional:  Negative for activity change, appetite change, chills, diaphoresis, fatigue, fever and unexpected weight change.  HENT:  Negative for congestion.   Respiratory:  Negative for cough, shortness of breath and wheezing.   Cardiovascular:  Negative for chest pain, palpitations and leg swelling.  Gastrointestinal:  Negative for abdominal distention, abdominal pain, constipation and diarrhea.  Genitourinary:  Negative for difficulty urinating and dysuria.  Musculoskeletal:  Positive for gait problem. Negative  for arthralgias, back pain, joint swelling and myalgias.  Neurological:  Negative for dizziness, tremors, seizures, syncope, facial asymmetry, speech difficulty, weakness, light-headedness, numbness and headaches.  Psychiatric/Behavioral:  Positive for confusion. Negative for agitation and behavioral problems.     Immunization History  Administered Date(s) Administered   Influenza Split 11/22/2011, 09/30/2013   Influenza, Quadrivalent, Recombinant, Inj, Pf 10/08/2019, 09/14/2020   Influenza,inj,Quad PF,6+ Mos 10/03/2013   Influenza-Unspecified 08/23/2016, 09/20/2022   MODERNA COVID-19 SARS-COV-2 PEDS BIVALENT BOOSTER 6Y-11Y 09/28/2021   Moderna SARS-COV2 Booster Vaccination 10/31/2020   Moderna Sars-Covid-2 Vaccination 12/28/2019, 01/26/2020, 10/21/2022   Pneumococcal Conjugate-13 01/09/2016   Pneumococcal Polysaccharide-23 08/12/2012, 10/03/2013   Td 08/16/2010   Tdap 01/16/2023   Zoster Recombinat (Shingrix) 10/15/2022, 01/16/2023   Zoster, Live 10/07/2006   Pertinent  Health Maintenance Due  Topic Date Due   DEXA SCAN  02/21/2024 (Originally 09/16/2002)   INFLUENZA VACCINE  07/17/2023      04/18/2021  11:56 AM 03/06/2023    3:17 PM 04/25/2023   10:58 AM  Fall Risk  Falls in the past year?  0 0  Was there an injury with Fall?  0 0  Fall Risk Category Calculator  0 0  (RETIRED) Patient Fall Risk Level Low fall risk    Patient at Risk for Falls Due to  Impaired balance/gait History of fall(s);Impaired balance/gait;Impaired mobility  Fall risk Follow up  Falls evaluation completed Falls evaluation completed   Functional Status Survey:    Vitals:   05/22/23 1433 05/22/23 1441  BP: (!) 168/84 (!) 158/84  Pulse: 83   Resp: 16   Temp: (!) 97 F (36.1 C)   SpO2: 97%   Weight: 136 lb 9.6 oz (62 kg)   Height: 5\' 4"  (1.626 m)    Body mass index is 23.45 kg/m. Physical Exam Vitals and nursing note reviewed.  Constitutional:      General: She is not in acute distress.     Appearance: She is not diaphoretic.  HENT:     Head: Normocephalic and atraumatic.  Neck:     Vascular: No JVD.  Cardiovascular:     Rate and Rhythm: Normal rate and regular rhythm.     Heart sounds: No murmur heard. Pulmonary:     Effort: Pulmonary effort is normal. No respiratory distress.     Breath sounds: Normal breath sounds. No wheezing.  Abdominal:     General: Bowel sounds are normal. There is no distension.     Palpations: Abdomen is soft.     Tenderness: There is no abdominal tenderness.  Musculoskeletal:     Right lower leg: No edema.     Left lower leg: No edema.  Skin:    General: Skin is warm and dry.  Neurological:     General: No focal deficit present.     Mental Status: She is alert. Mental status is at baseline.  Psychiatric:        Mood and Affect: Mood normal.     Labs reviewed: Recent Labs    01/15/23 0000  NA 140  K 4.2  CL 106  CO2 23*  BUN 12  CREATININE 0.7  CALCIUM 9.0   Recent Labs    01/15/23 0000  AST 18  ALT 8  ALKPHOS 64   Recent Labs    01/15/23 0000  WBC 7.8  HGB 13.2  HCT 39  PLT 202   Lab Results  Component Value Date   TSH 4.07 11/13/2022   No results found for: "HGBA1C" Lab Results  Component Value Date   TRIG 142 11/04/2013    Significant Diagnostic Results in last 30 days:  No results found.  Assessment/Plan   Essential hypertension, benign Please record manual BPs in matrix each week rather than automatic.  Continue losartan   Dementia without behavioral disturbance (HCC) Progressive decline in cognition and physical function c/w the disease. Continue supportive care in the skilled environment. Followed by neurology [facility].   Hypothyroidism Hx of goiter Continue synthroid 50 mcg qd  Overactive bladder Continue myrbetriq   Mixed hyperlipidemia Continue crestor   Family/ staff Communication: nurse  Labs/tests ordered:  CBC BMP 1 month

## 2023-06-08 ENCOUNTER — Telehealth: Payer: Self-pay | Admitting: Orthopedic Surgery

## 2023-06-08 NOTE — Telephone Encounter (Signed)
[facility] calls to report fever 101.8 and worsening cough. 06/20 patient ate a piece of steak and had brief choking episode. She ended up vomiting food. Cough has progressed the past few days, described as wet. Orders for stat CXR given. Advised to give tylenol 650 mg for fever.

## 2023-06-09 ENCOUNTER — Non-Acute Institutional Stay (SKILLED_NURSING_FACILITY): Payer: Medicare HMO | Admitting: Adult Health

## 2023-06-09 ENCOUNTER — Encounter: Payer: Self-pay | Admitting: Adult Health

## 2023-06-09 DIAGNOSIS — R059 Cough, unspecified: Secondary | ICD-10-CM | POA: Diagnosis not present

## 2023-06-09 DIAGNOSIS — R509 Fever, unspecified: Secondary | ICD-10-CM

## 2023-06-09 DIAGNOSIS — R1312 Dysphagia, oropharyngeal phase: Secondary | ICD-10-CM

## 2023-06-09 MED ORDER — AMOXICILLIN-POT CLAVULANATE 875-125 MG PO TABS: 1.0000 | ORAL_TABLET | Freq: Two times a day (BID) | ORAL | 0 refills | Status: DC

## 2023-06-09 NOTE — Progress Notes (Signed)
Location:  [facility] Room Number: 312A Place of Service:  SNF [phone]) Provider:  Fletcher Anon, NP  Mahlon Gammon, MD  Patient Care Team: Mahlon Gammon, MD as PCP - General (Internal Medicine)  Extended Emergency Contact Information Primary Emergency Contact: Mateo,Wallace Address: [address]          [place], [place] [address] [place] of [place] Home Phone: [phone] Mobile Phone: [phone] Relation: Spouse Secondary Emergency Contact: Brady,Anora Address: [address]          [place], [place] Mobile Phone: [phone] Relation: Daughter  Code Status:  DNR Goals of care: Advanced Directive information    06/09/2023    9:13 AM  Advanced Directives  Does Patient Have a Medical Advance Directive? Yes  Type of Estate agent of [place];Out of facility DNR (pink MOST or yellow form)  Does patient want to make changes to medical advance directive? No - Patient declined  Copy of Healthcare Power of Attorney in Chart? Yes - validated most recent copy scanned in chart (See row information)     Chief Complaint  Patient presents with   Acute Visit    Patient is being seen for a acute fever     HPI:  Pt is a 88 y.o. female seen today for an acute visit for fever.  SBAR written indicating a coughing episode with emesis on 6/21.  Symptoms of fever with temp of 103, shivers, walking difficulty and weakness present on 6/23.  CXR ordered stat and pending. Pt is now feeling better. Temp 100. NO sob or cough. Still using WC more due to slow gait. Sats in the 90s on RA.  LBM 6/22.  NO abd pain or nausea. Rapid covid and flu swab negative.   She has a hx of dementia and resides in the memory care unit.  Past Medical History:  Diagnosis Date   Anemia    Bowel obstruction (HCC)    Calculus of gallbladder with acute cholecystitis, without mention of obstruction 10/01/2013   Open chole  on 10/26/13. 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    COPD (chronic obstructive pulmonary disease) (HCC)    Depression    "sold beach house 01/2012" (10/25/2013)   Gall stones    Goiter    H/O sebaceous cyst    Hepatic cyst 10/01/2013   CT   History of diverticular abscess 09/2013   Hyperlipemia    Hypertension    "was taking RX; took me off it 09/2013" (10/25/2013)   Hypothyroidism    goiter   Insomnia    MCI (mild cognitive impairment)    Osteopenia    Protein calorie malnutrition (HCC)    malabsorption   Seasonal allergies    Vitamin B12 deficiency    Past Surgical History:  Procedure Laterality Date   ABCESS DRAINAGE  2014   "had drain put in & stayed for ~ 1 wk to 10 day; just came out ~ 10 days ago" (10/25/2013)   CATARACT EXTRACTION W/ INTRAOCULAR LENS IMPLANT Left ~ 2010   CHOLECYSTECTOMY N/A 10/26/2013   Procedure: ATTEMPTED  LAPAROSCOPIC CONVERTED TO OPEN CHOLECYSTECTOMY WITH INTRAOPERATIVE CHOLANGIOGRAM; REPAIR OF CHOLECOLONIC FISTULA; cholecystoduodenal fistula repair; lysis of adhesions; drainage of pelvic abscess; omental patching of duodenal repair;  Surgeon: Clovis Pu. Cornett, MD;  Location: [facility] OR;  Service: General;  Laterality: N/A;   COLON RESECTION  2014   DILATION AND CURETTAGE OF UTERUS  EXCISION MASS NECK Left 10/31/2016   Procedure: EXCISION CYST LEFT NECK;  Surgeon: Harriette Bouillon, MD;  Location: [facility];  Service: General;  Laterality: Left;  EXCISION CYST LEFT NECK    Allergies  Allergen Reactions   Ambien [Zolpidem Tartrate] Other (See Comments)    Makes her unsteady   Cymbalta [Duloxetine Hcl] Other (See Comments)    "kinda went nuts"   Halcion [Triazolam] Other (See Comments)    "kinda went nuts"    Outpatient Encounter Medications as of 06/09/2023  Medication Sig   acetaminophen (TYLENOL) 325 MG tablet Take 650 mg by mouth every 4 (four) hours as needed for  fever.   Ascorbic Acid (VITAMIN C) 500 MG CAPS Take 500 mg by mouth daily.   Cholecalciferol (VITAMIN D3) 50 MCG (2000 UT) TABS Take 1 tablet by mouth daily.   Cyanocobalamin (VITAMIN B 12 PO) Take 2,000 mcg by mouth daily.   levothyroxine (SYNTHROID) 50 MCG tablet Take 50 mcg by mouth daily before breakfast.   losartan (COZAAR) 50 MG tablet Take 1 tablet (50 mg total) by mouth daily.   Melatonin 5 MG CAPS Take 5 mg by mouth at bedtime.   memantine (NAMENDA) 5 MG tablet Take 2 tablets (10 mg total) by mouth 2 (two) times daily.   mirabegron ER (MYRBETRIQ) 25 MG TB24 tablet Take 25 mg by mouth daily.   rosuvastatin (CRESTOR) 10 MG tablet Take 10 mg by mouth daily.   No facility-administered encounter medications on file as of 06/09/2023.    Review of Systems  Unable to perform ROS: Dementia    Immunization History  Administered Date(s) Administered   Influenza Split 11/22/2011, 09/30/2013   Influenza, Quadrivalent, Recombinant, Inj, Pf 10/08/2019, 09/14/2020   Influenza,inj,Quad PF,6+ Mos 10/03/2013   Influenza-Unspecified 08/23/2016, 09/20/2022   MODERNA COVID-19 SARS-COV-2 PEDS BIVALENT BOOSTER 6Y-11Y 09/28/2021   Moderna SARS-COV2 Booster Vaccination 10/31/2020   Moderna Sars-Covid-2 Vaccination 12/28/2019, 01/26/2020, 10/21/2022   Pneumococcal Conjugate-13 01/09/2016   Pneumococcal Polysaccharide-23 08/12/2012, 10/03/2013   Td 08/16/2010   Tdap 01/16/2023   Zoster Recombinat (Shingrix) 10/15/2022, 01/16/2023   Zoster, Live 10/07/2006   Pertinent  Health Maintenance Due  Topic Date Due   DEXA SCAN  02/21/2024 (Originally 09/16/2002)   INFLUENZA VACCINE  07/17/2023      04/18/2021   11:56 AM 03/06/2023    3:17 PM 04/25/2023   10:58 AM  Fall Risk  Falls in the past year?  0 0  Was there an injury with Fall?  0 0  Fall Risk Category Calculator  0 0  (RETIRED) Patient Fall Risk Level Low fall risk    Patient at Risk for Falls Due to  Impaired balance/gait History of  fall(s);Impaired balance/gait;Impaired mobility  Fall risk Follow up  Falls evaluation completed Falls evaluation completed   Functional Status Survey:    Vitals:   06/09/23 0908  BP: (!) 172/88  Pulse: (!) 105  Resp: 16  Temp: (!) 101.8 F (38.8 C)  TempSrc: Temporal  SpO2: 92%  Weight: 136 lb 9.6 oz (62 kg)  Height: 5\' 4"  (1.626 m)   Body mass index is 23.45 kg/m. Physical Exam Vitals and nursing note reviewed.  Constitutional:      General: She is not in acute distress.    Appearance: She is not diaphoretic.  HENT:     Head: Normocephalic and atraumatic.     Nose: Nose normal.     Mouth/Throat:     Mouth: Mucous membranes are moist.  Comments: No exudate. Residual food in mouth.  Eyes:     General:        Right eye: No discharge.        Left eye: No discharge.     Conjunctiva/sclera: Conjunctivae normal.     Pupils: Pupils are equal, round, and reactive to light.  Neck:     Vascular: No JVD.  Cardiovascular:     Rate and Rhythm: Normal rate and regular rhythm.     Heart sounds: No murmur heard. Pulmonary:     Effort: Pulmonary effort is normal. No respiratory distress.     Breath sounds: No stridor. Rales (left) present. No wheezing or rhonchi.  Abdominal:     General: Bowel sounds are normal. There is no distension.     Palpations: Abdomen is soft.     Tenderness: There is no abdominal tenderness. There is no right CVA tenderness or left CVA tenderness.  Musculoskeletal:     Cervical back: No rigidity or tenderness.     Right lower leg: No edema.     Left lower leg: No edema.  Lymphadenopathy:     Cervical: Cervical adenopathy present.  Skin:    General: Skin is warm and dry.  Neurological:     General: No focal deficit present.     Mental Status: She is alert. Mental status is at baseline.  Psychiatric:        Mood and Affect: Mood normal.     Labs reviewed: Recent Labs    01/15/23 0000  NA 140  K 4.2  CL 106  CO2 23*  BUN 12   CREATININE 0.7  CALCIUM 9.0   Recent Labs    01/15/23 0000  AST 18  ALT 8  ALKPHOS 64   Recent Labs    01/15/23 0000  WBC 7.8  HGB 13.2  HCT 39  PLT 202   Lab Results  Component Value Date   TSH 4.07 11/13/2022   No results found for: "HGBA1C" Lab Results  Component Value Date   TRIG 142 11/04/2013    Significant Diagnostic Results in last 30 days:  No results found.  Assessment/Plan  1. Fever, unspecified fever cause ?aspiration pna Some delay in xray result Will start augmentin bid 875 mg x 7 days  2. Oropharyngeal dysphagia ST eval and tx   Family/ staff Communication: discussed with her son Ala Kratz (returned phone call)  Labs/tests ordered:  CBC CMP CXR

## 2023-06-10 ENCOUNTER — Telehealth: Payer: Self-pay | Admitting: Orthopedic Surgery

## 2023-06-10 DIAGNOSIS — R509 Fever, unspecified: Secondary | ICD-10-CM | POA: Diagnosis not present

## 2023-06-10 LAB — COMPREHENSIVE METABOLIC PANEL
Albumin: 3.4 — AB (ref 3.5–5.0)
Calcium: 9.1 (ref 8.7–10.7)
Globulin: 3

## 2023-06-10 LAB — BASIC METABOLIC PANEL
BUN: 20 (ref 4–21)
CO2: 18 (ref 13–22)
Chloride: 100 (ref 99–108)
Creatinine: 1 (ref 0.5–1.1)
Glucose: 131
Potassium: 5.5 mEq/L — AB (ref 3.5–5.1)
Sodium: 136 — AB (ref 137–147)

## 2023-06-10 LAB — CBC AND DIFFERENTIAL
HCT: 44 (ref 36–46)
Hemoglobin: 14.4 (ref 12.0–16.0)
Platelets: 175 10*3/uL (ref 150–400)
WBC: 13.6

## 2023-06-10 LAB — HEPATIC FUNCTION PANEL
ALT: 48 U/L — AB (ref 7–35)
AST: 85 — AB (ref 13–35)
Alkaline Phosphatase: 113 (ref 25–125)

## 2023-06-10 LAB — CBC: RBC: 4.62 (ref 3.87–5.11)

## 2023-06-10 NOTE — Telephone Encounter (Signed)
Stat CXR noted mild pulmonary infiltrate in left lung base. 06/24 she was started on Augmentin for suspected aspiration pneumonia.

## 2023-06-17 ENCOUNTER — Non-Acute Institutional Stay (SKILLED_NURSING_FACILITY): Payer: Medicare HMO | Admitting: Orthopedic Surgery

## 2023-06-17 ENCOUNTER — Encounter: Payer: Self-pay | Admitting: Orthopedic Surgery

## 2023-06-17 DIAGNOSIS — N3281 Overactive bladder: Secondary | ICD-10-CM

## 2023-06-17 DIAGNOSIS — E039 Hypothyroidism, unspecified: Secondary | ICD-10-CM | POA: Diagnosis not present

## 2023-06-17 DIAGNOSIS — J189 Pneumonia, unspecified organism: Secondary | ICD-10-CM

## 2023-06-17 DIAGNOSIS — R1312 Dysphagia, oropharyngeal phase: Secondary | ICD-10-CM | POA: Diagnosis not present

## 2023-06-17 DIAGNOSIS — F5101 Primary insomnia: Secondary | ICD-10-CM

## 2023-06-17 DIAGNOSIS — E782 Mixed hyperlipidemia: Secondary | ICD-10-CM

## 2023-06-17 DIAGNOSIS — F0390 Unspecified dementia without behavioral disturbance: Secondary | ICD-10-CM | POA: Diagnosis not present

## 2023-06-17 DIAGNOSIS — I10 Essential (primary) hypertension: Secondary | ICD-10-CM | POA: Diagnosis not present

## 2023-06-17 NOTE — Progress Notes (Signed)
[facility] Room Number: 312-A Place of Service:  SNF [phone]) Provider:  Hazle Nordmann, NP  PCP: Mahlon Gammon, MD  Patient Care Team: Mahlon Gammon, MD as PCP - General (Internal Medicine)  Extended Emergency Contact Information Primary Emergency Contact: Croston,Wallace Address: [address]          [place], [place] [address] [place] of [place] Home Phone: [phone] Mobile Phone: [phone] Relation: Spouse Secondary Emergency Contact: Brady,Caryn Address: [address]          [place], [place] Mobile Phone: [phone] Relation: Daughter  Code Status:  DNR Goals of care: Advanced Directive information    06/17/2023   10:08 AM  Advanced Directives  Does Patient Have a Medical Advance Directive? Yes  Type of Estate agent of [place];Out of facility DNR (pink MOST or yellow form)  Does patient want to make changes to medical advance directive? No - Patient declined  Copy of Healthcare Power of Attorney in Chart? Yes - validated most recent copy scanned in chart (See row information)     Chief Complaint  Patient presents with   Medical Management of Chronic Issues    Routine Visit.     HPI:  Pt is a 88 y.o. female seen today for medical management of chronic diseases.    She currently resides on the skilled nursing unit at [facility]. PMH: HTN, diverticulitis with pericolonic abscess, gangrenous gallbladder s/p abdominal surgery 2014, goiter, hypothyroidism, OAB, dementia, insomnia, and gait abnormality.   PNA- 06/23 fever/chills/weakness, CXR revealed infiltrate left lung base, 06/24 started on Augmentin x 10 days Dysphagia- 06/21 coughing with emesis> concerns for aspiration PNA, remains on regular diet Dementia- recent MMSE 19/30 & MOCA 20/30, 04/2021 MRI brain showed moderate to sever atrophy and moderate ventriculometry/ moderate chronic small vessel ischemic  disease, no behaviors, ambulates with rolator,  remains on Namenda HTN- BUN/creat 20/1.0 06/10/2023, remains on losartan HLD- remains on rosuvastatin OAB- remains on Myrbetriq Hypothyroidism- TSH 4.07 10/2022, remains on levothyroxine Insomnia- remains on melatonin  Recent blood pressures:  06/26- 149/75, 138/88  06/25- 158/78, 134/70  Recent weights:  07/01- 137.2 lbs  06/01- 136.6 lbs  05/01- 138.2 lbs     Past Medical History:  Diagnosis Date   Anemia    Bowel obstruction (HCC)    Calculus of gallbladder with acute cholecystitis, without mention of obstruction 10/01/2013   Open chole on 10/26/13. 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    COPD (chronic obstructive pulmonary disease) (HCC)    Depression    "sold beach house 01/2012" (10/25/2013)   Gall stones    Goiter    H/O sebaceous cyst    Hepatic cyst 10/01/2013   CT   History of diverticular abscess 09/2013   Hyperlipemia    Hypertension    "was taking RX; took me off it 09/2013" (10/25/2013)   Hypothyroidism    goiter   Insomnia    MCI (mild cognitive impairment)    Osteopenia    Protein calorie malnutrition (HCC)    malabsorption   Seasonal allergies    Vitamin B12 deficiency    Past Surgical History:  Procedure Laterality Date   ABCESS DRAINAGE  2014   "had drain put in & stayed for ~ 1 wk to 10 day; just came out ~ 10 days ago" (10/25/2013)   CATARACT EXTRACTION W/ INTRAOCULAR LENS IMPLANT Left ~ 2010   CHOLECYSTECTOMY N/A  10/26/2013   Procedure: ATTEMPTED  LAPAROSCOPIC CONVERTED TO OPEN CHOLECYSTECTOMY WITH INTRAOPERATIVE CHOLANGIOGRAM; REPAIR OF CHOLECOLONIC FISTULA; cholecystoduodenal fistula repair; lysis of adhesions; drainage of pelvic abscess; omental patching of duodenal repair;  Surgeon: Clovis Pu. Cornett, MD;  Location: [facility] OR;  Service: General;  Laterality: N/A;   COLON RESECTION  2014   DILATION AND CURETTAGE OF UTERUS     EXCISION  MASS NECK Left 10/31/2016   Procedure: EXCISION CYST LEFT NECK;  Surgeon: Harriette Bouillon, MD;  Location: [facility];  Service: General;  Laterality: Left;  EXCISION CYST LEFT NECK    Allergies  Allergen Reactions   Ambien [Zolpidem Tartrate] Other (See Comments)    Makes her unsteady   Cymbalta [Duloxetine Hcl] Other (See Comments)    "kinda went nuts"   Halcion [Triazolam] Other (See Comments)    "kinda went nuts"    Allergies as of 06/17/2023       Reactions   Ambien [zolpidem Tartrate] Other (See Comments)   Makes her unsteady   Cymbalta [duloxetine Hcl] Other (See Comments)   "kinda went nuts"   Halcion [triazolam] Other (See Comments)   "kinda went nuts"        Medication List        Accurate as of June 17, 2023 10:09 AM. If you have any questions, ask your nurse or doctor.          STOP taking these medications    amoxicillin-clavulanate 875-125 MG tablet Commonly known as: AUGMENTIN Stopped by: Octavia Heir, NP       TAKE these medications    levothyroxine 50 MCG tablet Commonly known as: SYNTHROID Take 50 mcg by mouth daily before breakfast.   losartan 50 MG tablet Commonly known as: Cozaar Take 1 tablet (50 mg total) by mouth daily.   Melatonin 5 MG Caps Take 5 mg by mouth at bedtime.   memantine 5 MG tablet Commonly known as: Namenda Take 2 tablets (10 mg total) by mouth 2 (two) times daily.   mirabegron ER 25 MG Tb24 tablet Commonly known as: MYRBETRIQ Take 25 mg by mouth daily.   rosuvastatin 10 MG tablet Commonly known as: CRESTOR Take 10 mg by mouth daily.   VITAMIN B 12 PO Take 2,000 mcg by mouth daily.   Vitamin C 500 MG Caps Take 500 mg by mouth daily.   Vitamin D3 50 MCG (2000 UT) Tabs Take 1 tablet by mouth daily.        Review of Systems  Unable to perform ROS: Dementia    Immunization History  Administered Date(s) Administered   Influenza Split 11/22/2011, 09/30/2013   Influenza, Quadrivalent,  Recombinant, Inj, Pf 10/08/2019, 09/14/2020   Influenza,inj,Quad PF,6+ Mos 10/03/2013   Influenza-Unspecified 08/23/2016, 09/20/2022   MODERNA COVID-19 SARS-COV-2 PEDS BIVALENT BOOSTER 6Y-11Y 09/28/2021   Moderna SARS-COV2 Booster Vaccination 10/31/2020   Moderna Sars-Covid-2 Vaccination 12/28/2019, 01/26/2020, 10/21/2022   Pneumococcal Conjugate-13 01/09/2016   Pneumococcal Polysaccharide-23 08/12/2012, 10/03/2013   Td 08/16/2010   Tdap 01/16/2023   Zoster Recombinant(Shingrix) 10/15/2022, 01/16/2023   Zoster, Live 10/07/2006   Pertinent  Health Maintenance Due  Topic Date Due   DEXA SCAN  02/21/2024 (Originally 09/16/2002)   INFLUENZA VACCINE  07/17/2023      04/18/2021   11:56 AM 03/06/2023    3:17 PM 04/25/2023   10:58 AM  Fall Risk  Falls in the past year?  0 0  Was there an injury with Fall?  0 0  Fall Risk Category  Calculator  0 0  (RETIRED) Patient Fall Risk Level Low fall risk    Patient at Risk for Falls Due to  Impaired balance/gait History of fall(s);Impaired balance/gait;Impaired mobility  Fall risk Follow up  Falls evaluation completed Falls evaluation completed   Functional Status Survey:    Vitals:   06/17/23 1006  BP: (!) 149/75  Pulse: 87  Resp: 16  Temp: (!) 97 F (36.1 C)  SpO2: 95%  Weight: 137 lb 3.2 oz (62.2 kg)  Height: 5\' 4"  (1.626 m)   Body mass index is 23.55 kg/m. Physical Exam Vitals reviewed.  Constitutional:      General: She is not in acute distress. HENT:     Head: Normocephalic.     Right Ear: There is no impacted cerumen.     Left Ear: There is no impacted cerumen.     Nose: Nose normal.     Mouth/Throat:     Mouth: Mucous membranes are moist.  Eyes:     General:        Right eye: No discharge.        Left eye: No discharge.  Cardiovascular:     Rate and Rhythm: Normal rate and regular rhythm.     Pulses: Normal pulses.     Heart sounds: Normal heart sounds.  Pulmonary:     Effort: Pulmonary effort is normal. No  respiratory distress.     Breath sounds: Normal breath sounds. No wheezing or rales.  Abdominal:     General: Bowel sounds are normal. There is no distension.     Palpations: Abdomen is soft.     Tenderness: There is no abdominal tenderness.  Musculoskeletal:     Cervical back: Neck supple.     Right lower leg: No edema.     Left lower leg: No edema.  Skin:    General: Skin is warm.     Capillary Refill: Capillary refill takes less than 2 seconds.  Neurological:     General: No focal deficit present.     Mental Status: She is alert. Mental status is at baseline.     Motor: Weakness present.     Gait: Gait abnormal.     Comments: rolator  Psychiatric:        Mood and Affect: Mood normal.     Comments: Very pleasant, follows commands, alert to self/familiar face     Labs reviewed: Recent Labs    01/15/23 0000 06/10/23 0000  NA 140 136*  K 4.2 5.5*  CL 106 100  CO2 23* 18  BUN 12 20  CREATININE 0.7 1.0  CALCIUM 9.0 9.1   Recent Labs    01/15/23 0000 06/10/23 0000  AST 18 85*  ALT 8 48*  ALKPHOS 64 113  ALBUMIN  --  3.4*   Recent Labs    01/15/23 0000 06/10/23 0000  WBC 7.8 13.6  HGB 13.2 14.4  HCT 39 44  PLT 202 175   Lab Results  Component Value Date   TSH 4.07 11/13/2022   No results found for: "HGBA1C" Lab Results  Component Value Date   TRIG 142 11/04/2013    Significant Diagnostic Results in last 30 days:  No results found.  Assessment/Plan 1. Pneumonia of left lower lobe due to infectious organism - 06/01 coughing episode> ? aspiration - 06/23 fever/chills/weakness - CXR confirmed infiltrate to left lower lung - 06/24 Augmentin x 10 days -  lung sounds clear today, vitals stable  2. Oropharyngeal dysphagia -  see above - cont regular diet  3. Dementia without behavioral disturbance (HCC) - followed by neuro> last MMSE 19/30/ MOCA 20/30 - no behaviors - ambulates with rolator - dependent with some ADLs - cont memory care - cont  Namenda  4. Essential hypertension, benign - controlled - cont losartan  5. Mixed hyperlipidemia - cont rosuvastatin  6. Overactive bladder - stable with Myrbetriq  7. Acquired hypothyroidism - TSH stable - cont levothyroxine  8. Primary insomnia - cont melatonin    Family/ staff Communication: plan discussed with patient and nurse  Labs/tests ordered:  none

## 2023-07-15 ENCOUNTER — Non-Acute Institutional Stay (SKILLED_NURSING_FACILITY): Payer: Medicare HMO | Admitting: Orthopedic Surgery

## 2023-07-15 ENCOUNTER — Encounter: Payer: Self-pay | Admitting: Orthopedic Surgery

## 2023-07-15 DIAGNOSIS — R21 Rash and other nonspecific skin eruption: Secondary | ICD-10-CM

## 2023-07-15 DIAGNOSIS — F0390 Unspecified dementia without behavioral disturbance: Secondary | ICD-10-CM

## 2023-07-15 MED ORDER — HYDROCORTISONE 1 % EX CREA
1.0000 | TOPICAL_CREAM | Freq: Two times a day (BID) | CUTANEOUS | Status: AC
Start: 2023-07-15 — End: 2023-07-20

## 2023-07-15 MED ORDER — PREDNISONE 20 MG PO TABS
ORAL_TABLET | ORAL | Status: AC
Start: 2023-07-15 — End: 2023-07-22

## 2023-07-15 NOTE — Progress Notes (Signed)
[facility] Room Number: 312-A Place of Service:  SNF [phone]) Provider:  Hazle Nordmann, NP  PCP: Mahlon Gammon, MD  Patient Care Team: Mahlon Gammon, MD as PCP - General (Internal Medicine)  Extended Emergency Contact Information Primary Emergency Contact: Iezzi,Wallace Address: [address]          [place], [place] [address] [place] Home Phone: [phone] Mobile Phone: [phone] Relation: Spouse Secondary Emergency Contact: Brady,Lance Address: [address]          [place], [place] Mobile Phone: [phone] Relation: Daughter  Code Status:  DNR  Goals of care: Advanced Directive information    07/15/2023   11:03 AM  Advanced Directives  Does Patient Have a Medical Advance Directive? Yes  Type of Estate agent of [place];Out of facility DNR (pink MOST or yellow form)  Does patient want to make changes to medical advance directive? No - Patient declined  Copy of Healthcare Power of Attorney in Chart? Yes - validated most recent copy scanned in chart (See row information)     Chief Complaint  Patient presents with   Acute Visit    Rash on chest.    HPI:  Pt is a 88 y.o. female seen today for an acute visit due to rash.   She currently resides on the skilled nursing unit at [facility]. PMH: HTN, diverticulitis with pericolonic abscess, gangrenous gallbladder s/p abdominal surgery 2014, goiter, hypothyroidism, OAB, dementia, insomnia, and gait abnormality.   Poor historian due to dementia. Nursing reports increased rash to chest x 1 day. No changes to medications, detergents, soaps or lotions. Rash pattern is similar to piece of costume jewelry the day before. She denies itching or pain. Afebrile. Vitals stable.     Past Medical History:  Diagnosis Date   Anemia    Bowel obstruction (HCC)    Calculus of gallbladder with acute cholecystitis, without  mention of obstruction 10/01/2013   Open chole on 10/26/13. 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    COPD (chronic obstructive pulmonary disease) (HCC)    Depression    "sold beach house 01/2012" (10/25/2013)   Gall stones    Goiter    H/O sebaceous cyst    Hepatic cyst 10/01/2013   CT   History of diverticular abscess 09/2013   Hyperlipemia    Hypertension    "was taking RX; took me off it 09/2013" (10/25/2013)   Hypothyroidism    goiter   Insomnia    MCI (mild cognitive impairment)    Osteopenia    Protein calorie malnutrition (HCC)    malabsorption   Seasonal allergies    Vitamin B12 deficiency    Past Surgical History:  Procedure Laterality Date   ABCESS DRAINAGE  2014   "had drain put in & stayed for ~ 1 wk to 10 day; just came out ~ 10 days ago" (10/25/2013)   CATARACT EXTRACTION W/ INTRAOCULAR LENS IMPLANT Left ~ 2010   CHOLECYSTECTOMY N/A 10/26/2013   Procedure: ATTEMPTED  LAPAROSCOPIC CONVERTED TO OPEN CHOLECYSTECTOMY WITH INTRAOPERATIVE CHOLANGIOGRAM; REPAIR OF CHOLECOLONIC FISTULA; cholecystoduodenal fistula repair; lysis of adhesions; drainage of pelvic abscess; omental patching of duodenal repair;  Surgeon: Clovis Pu. Cornett, MD;  Location: [facility];  Service: General;  Laterality: N/A;   COLON RESECTION  2014   DILATION AND CURETTAGE OF UTERUS     EXCISION MASS NECK Left 10/31/2016   Procedure: EXCISION  CYST LEFT NECK;  Surgeon: Harriette Bouillon, MD;  Location: [facility];  Service: General;  Laterality: Left;  EXCISION CYST LEFT NECK    Allergies  Allergen Reactions   Ambien [Zolpidem Tartrate] Other (See Comments)    Makes her unsteady   Cymbalta [Duloxetine Hcl] Other (See Comments)    "kinda went nuts"   Halcion [Triazolam] Other (See Comments)    "kinda went nuts"    Allergies as of 07/15/2023       Reactions   Ambien [zolpidem Tartrate] Other (See Comments)   Makes her unsteady    Cymbalta [duloxetine Hcl] Other (See Comments)   "kinda went nuts"   Halcion [triazolam] Other (See Comments)   "kinda went nuts"        Medication List        Accurate as of July 15, 2023 11:03 AM. If you have any questions, ask your nurse or doctor.          STOP taking these medications    Vitamin C 500 MG Caps Stopped by: Tanay Misuraca E Mozelle Remlinger       TAKE these medications    levothyroxine 50 MCG tablet Commonly known as: SYNTHROID Take 50 mcg by mouth daily before breakfast.   losartan 50 MG tablet Commonly known as: Cozaar Take 1 tablet (50 mg total) by mouth daily.   Melatonin 5 MG Caps Take 5 mg by mouth at bedtime.   memantine 5 MG tablet Commonly known as: Namenda Take 2 tablets (10 mg total) by mouth 2 (two) times daily.   mirabegron ER 25 MG Tb24 tablet Commonly known as: MYRBETRIQ Take 25 mg by mouth daily.   rosuvastatin 10 MG tablet Commonly known as: CRESTOR Take 10 mg by mouth daily.   VITAMIN B 12 PO Take 2,000 mcg by mouth daily.   Vitamin D3 50 MCG (2000 UT) Tabs Take 1 tablet by mouth daily.        Review of Systems  Unable to perform ROS: Dementia    Immunization History  Administered Date(s) Administered   Influenza Split 11/22/2011, 09/30/2013   Influenza, Quadrivalent, Recombinant, Inj, Pf 10/08/2019, 09/14/2020   Influenza,inj,Quad PF,6+ Mos 10/03/2013   Influenza-Unspecified 08/23/2016, 09/20/2022   MODERNA COVID-19 SARS-COV-2 PEDS BIVALENT BOOSTER 72yr-10yr 09/28/2021   Moderna SARS-COV2 Booster Vaccination 10/31/2020   Moderna Sars-Covid-2 Vaccination 12/28/2019, 01/26/2020, 10/21/2022   Pneumococcal Conjugate-13 01/09/2016   Pneumococcal Polysaccharide-23 08/12/2012, 10/03/2013   Td 08/16/2010   Tdap 01/16/2023   Zoster Recombinant(Shingrix) 10/15/2022, 01/16/2023   Zoster, Live 10/07/2006   Pertinent  Health Maintenance Due  Topic Date Due   DEXA SCAN  02/21/2024 (Originally 09/16/2002)   INFLUENZA VACCINE   07/17/2023      04/18/2021   11:56 AM 03/06/2023    3:17 PM 04/25/2023   10:58 AM  Fall Risk  Falls in the past year?  0 0  Was there an injury with Fall?  0 0  Fall Risk Category Calculator  0 0  (RETIRED) Patient Fall Risk Level Low fall risk    Patient at Risk for Falls Due to  Impaired balance/gait History of fall(s);Impaired balance/gait;Impaired mobility  Fall risk Follow up  Falls evaluation completed Falls evaluation completed   Functional Status Survey:    Vitals:   07/15/23 1058  BP: (!) 147/78  Pulse: 94  Resp: 18  Temp: 98.4 F (36.9 C)  SpO2: 93%  Weight: 137 lb 3.2 oz (62.2 kg)  Height: 5\' 4"  (1.626 m)   Body  mass index is 23.55 kg/m. Physical Exam Vitals reviewed.  Constitutional:      General: She is not in acute distress. HENT:     Head: Normocephalic.  Eyes:     General:        Right eye: No discharge.        Left eye: No discharge.  Cardiovascular:     Rate and Rhythm: Normal rate and regular rhythm.     Pulses: Normal pulses.     Heart sounds: Normal heart sounds.  Pulmonary:     Effort: Pulmonary effort is normal. No respiratory distress.     Breath sounds: Normal breath sounds. No wheezing.  Abdominal:     Palpations: Abdomen is soft.  Musculoskeletal:     Cervical back: Neck supple.     Right lower leg: No edema.     Left lower leg: No edema.  Skin:    General: Skin is warm.     Capillary Refill: Capillary refill takes less than 2 seconds.     Findings: Rash present.     Comments: Maculopapular rash to upper chest/neck extending downward in "V" shaped pattern, some lesions open> no sign of infection or scratch marks present.   Neurological:     General: No focal deficit present.     Mental Status: She is alert. Mental status is at baseline.     Motor: Weakness present.     Gait: Gait abnormal.     Comments: FWW  Psychiatric:        Mood and Affect: Mood normal.     Comments: Very pleasant, flat affect, follows commands, alert to  self/familiar face     Labs reviewed: Recent Labs    01/15/23 0000 06/10/23 0000  NA 140 136*  K 4.2 5.5*  CL 106 100  CO2 23* 18  BUN 12 20  CREATININE 0.7 1.0  CALCIUM 9.0 9.1   Recent Labs    01/15/23 0000 06/10/23 0000  AST 18 85*  ALT 8 48*  ALKPHOS 64 113  ALBUMIN  --  3.4*   Recent Labs    01/15/23 0000 06/10/23 0000  WBC 7.8 13.6  HGB 13.2 14.4  HCT 39 44  PLT 202 175   Lab Results  Component Value Date   TSH 4.07 11/13/2022   No results found for: "HGBA1C" Lab Results  Component Value Date   TRIG 142 11/04/2013    Significant Diagnostic Results in last 30 days:  No results found.  Assessment/Plan 1. Rash and nonspecific skin eruption - maculopapular rash to upper chest/neck> pattern "V" shaped resembling necklace worn previous day - no changes to medications or toiletries - start prednisone taper and hydrocortisone cream 1%  2. Dementia without behavioral disturbance (HCC) - no behaviors - dependent with ADLs except feeding - ambulating with walker - weights stable - cont Namenda    Family/ staff Communication: plan discussed with patient and nurse  Labs/tests ordered:  none

## 2023-07-21 ENCOUNTER — Non-Acute Institutional Stay (SKILLED_NURSING_FACILITY): Payer: Medicare HMO | Admitting: Internal Medicine

## 2023-07-21 ENCOUNTER — Encounter: Payer: Self-pay | Admitting: Internal Medicine

## 2023-07-21 DIAGNOSIS — R21 Rash and other nonspecific skin eruption: Secondary | ICD-10-CM | POA: Diagnosis not present

## 2023-07-21 DIAGNOSIS — I10 Essential (primary) hypertension: Secondary | ICD-10-CM | POA: Diagnosis not present

## 2023-07-21 DIAGNOSIS — N3281 Overactive bladder: Secondary | ICD-10-CM | POA: Diagnosis not present

## 2023-07-21 DIAGNOSIS — F5101 Primary insomnia: Secondary | ICD-10-CM | POA: Diagnosis not present

## 2023-07-21 DIAGNOSIS — R269 Unspecified abnormalities of gait and mobility: Secondary | ICD-10-CM | POA: Diagnosis not present

## 2023-07-21 DIAGNOSIS — E039 Hypothyroidism, unspecified: Secondary | ICD-10-CM

## 2023-07-21 DIAGNOSIS — R1312 Dysphagia, oropharyngeal phase: Secondary | ICD-10-CM

## 2023-07-21 DIAGNOSIS — E782 Mixed hyperlipidemia: Secondary | ICD-10-CM | POA: Diagnosis not present

## 2023-07-21 DIAGNOSIS — F0390 Unspecified dementia without behavioral disturbance: Secondary | ICD-10-CM | POA: Diagnosis not present

## 2023-07-21 NOTE — Progress Notes (Signed)
Location:  Medical illustrator of Service:  SNF (31)  Provider:   Code Status: DNR Goals of Care:     07/15/2023   11:03 AM  Advanced Directives  Does Patient Have a Medical Advance Directive? Yes  Type of Estate agent of [place];Out of facility DNR (pink MOST or yellow form)  Does patient want to make changes to medical advance directive? No - Patient declined  Copy of Healthcare Power of Attorney in Chart? Yes - validated most recent copy scanned in chart (See row information)     Chief Complaint  Patient presents with   Medical Management of Chronic Issues    HPI: Patient is a 88 y.o. female seen today for medical management of chronic diseases.   Lives in Memory unit in [place]   Patient has a history of dementia Recent MMSE score 19/30 in Neurology Office Recent MOCA 20/30 MRI of the brain has showed generalized atrophy ventricular megaly Failed trial of Sinemet in the past   Also has a history of extensive abdominal surgery in 2014 due to gangrenous gallbladder and fistula History of alcohol abuse,, hypertension, hyperlipidemia and urinary incontinence  She had bad rash in her Chest area and was given Prednisone Rash most likely due to her Jewelery It has almost resolved She denies any itching    Past Medical History:  Diagnosis Date   Anemia    Bowel obstruction (HCC)    Calculus of gallbladder with acute cholecystitis, without mention of obstruction 10/01/2013   Open chole on 10/26/13. 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    COPD (chronic obstructive pulmonary disease) (HCC)    Depression    "sold beach house 01/2012" (10/25/2013)   Gall stones    Goiter    H/O sebaceous cyst    Hepatic cyst 10/01/2013   CT   History of diverticular abscess 09/2013   Hyperlipemia    Hypertension    "was taking RX; took me off it 09/2013" (10/25/2013)   Hypothyroidism     goiter   Insomnia    MCI (mild cognitive impairment)    Osteopenia    Protein calorie malnutrition (HCC)    malabsorption   Seasonal allergies    Vitamin B12 deficiency     Past Surgical History:  Procedure Laterality Date   ABCESS DRAINAGE  2014   "had drain put in & stayed for ~ 1 wk to 10 day; just came out ~ 10 days ago" (10/25/2013)   CATARACT EXTRACTION W/ INTRAOCULAR LENS IMPLANT Left ~ 2010   CHOLECYSTECTOMY N/A 10/26/2013   Procedure: ATTEMPTED  LAPAROSCOPIC CONVERTED TO OPEN CHOLECYSTECTOMY WITH INTRAOPERATIVE CHOLANGIOGRAM; REPAIR OF CHOLECOLONIC FISTULA; cholecystoduodenal fistula repair; lysis of adhesions; drainage of pelvic abscess; omental patching of duodenal repair;  Surgeon: Clovis Pu. Cornett, MD;  Location: [facility];  Service: General;  Laterality: N/A;   COLON RESECTION  2014   DILATION AND CURETTAGE OF UTERUS     EXCISION MASS NECK Left 10/31/2016   Procedure: EXCISION CYST LEFT NECK;  Surgeon: Harriette Bouillon, MD;  Location:  [facility]ERY [facility];  Service: General;  Laterality: Left;  EXCISION CYST LEFT NECK    Allergies  Allergen Reactions   Ambien [Zolpidem Tartrate] Other (See Comments)    Makes her unsteady   Cymbalta [Duloxetine Hcl] Other (See Comments)    "kinda went nuts"   Halcion [Triazolam] Other (See Comments)    "kinda went nuts"    Outpatient Encounter  Medications as of 07/21/2023  Medication Sig   Cholecalciferol (VITAMIN D3) 50 MCG (2000 UT) TABS Take 1 tablet by mouth daily.   Cyanocobalamin (VITAMIN B 12 PO) Take 2,000 mcg by mouth daily.   levothyroxine (SYNTHROID) 50 MCG tablet Take 50 mcg by mouth daily before breakfast.   losartan (COZAAR) 50 MG tablet Take 1 tablet (50 mg total) by mouth daily.   Melatonin 5 MG CAPS Take 5 mg by mouth at bedtime.   memantine (NAMENDA) 5 MG tablet Take 2 tablets (10 mg total) by mouth 2 (two) times daily.   mirabegron ER (MYRBETRIQ) 25 MG TB24 tablet Take 25 mg by mouth daily.   predniSONE  (DELTASONE) 20 MG tablet Take 2 tablets (40 mg total) by mouth daily with breakfast for 2 days, THEN 1 tablet (20 mg total) daily with breakfast for 5 days.   rosuvastatin (CRESTOR) 10 MG tablet Take 10 mg by mouth daily.   No facility-administered encounter medications on file as of 07/21/2023.    Review of Systems:  Review of Systems  Unable to perform ROS: Dementia    Health Maintenance  Topic Date Due   INFLUENZA VACCINE  07/17/2023   COVID-19 Vaccine (6 - 2023-24 season) 08/15/2023 (Originally 12/16/2022)   DEXA SCAN  02/21/2024 (Originally 09/16/2002)   Medicare Annual Wellness (AWV)  03/05/2024   DTaP/Tdap/Td (3 - Td or Tdap) 01/16/2033   Pneumonia Vaccine 46+ Years old  Completed   Zoster Vaccines- Shingrix  Completed   HPV VACCINES  Aged Out    Physical Exam: Vitals:   07/21/23 1700  BP: (!) 155/80  Pulse: 85  Resp: 18  Temp: (!) 97 F (36.1 C)  Weight: 136 lb (61.7 kg)   Body mass index is 23.34 kg/m. Physical Exam Vitals reviewed.  Constitutional:      Appearance: Normal appearance.  HENT:     Head: Normocephalic.     Nose: Nose normal.     Mouth/Throat:     Mouth: Mucous membranes are moist.     Pharynx: Oropharynx is clear.  Eyes:     Pupils: Pupils are equal, round, and reactive to light.  Cardiovascular:     Rate and Rhythm: Normal rate and regular rhythm.     Pulses: Normal pulses.     Heart sounds: Normal heart sounds. No murmur heard. Pulmonary:     Effort: Pulmonary effort is normal.     Breath sounds: Normal breath sounds.  Abdominal:     General: Abdomen is flat. Bowel sounds are normal.     Palpations: Abdomen is soft.  Musculoskeletal:        General: No swelling.     Cervical back: Neck supple.  Skin:    General: Skin is warm.     Comments: Rash in the Chest area almost resolved  Neurological:     General: No focal deficit present.     Mental Status: She is alert.  Psychiatric:        Mood and Affect: Mood normal.        Thought  Content: Thought content normal.     Labs reviewed: Basic Metabolic Panel: Recent Labs    09/18/22 0000 11/13/22 0000 01/15/23 0000 06/10/23 0000  NA  --   --  140 136*  K  --   --  4.2 5.5*  CL  --   --  106 100  CO2  --   --  23* 18  BUN  --   --  12 20  CREATININE  --   --  0.7 1.0  CALCIUM  --   --  9.0 9.1  TSH 5.07 4.07  --   --    Liver Function Tests: Recent Labs    01/15/23 0000 06/10/23 0000  AST 18 85*  ALT 8 48*  ALKPHOS 64 113  ALBUMIN  --  3.4*   No results for input(s): "LIPASE", "AMYLASE" in the last 8760 hours. No results for input(s): "AMMONIA" in the last 8760 hours. CBC: Recent Labs    01/15/23 0000 06/10/23 0000  WBC 7.8 13.6  HGB 13.2 14.4  HCT 39 44  PLT 202 175   Lipid Panel: No results for input(s): "CHOL", "HDL", "LDLCALC", "TRIG", "CHOLHDL", "LDLDIRECT" in the last 8760 hours. No results found for: "HGBA1C"  Procedures since last visit: No results found.  Assessment/Plan 1. Rash and nonspecific skin eruption Resolved with Prednisone ? Due to her Jewelry  2. Dementia without behavioral disturbance (HCC) On Namenda to help with her behaviors Her MOCA 20/30 MRI in 5/22 Severe Atropy  3. Gait abnormality Uses walker 4. Essential hypertension, benign Losartan  5. Mixed hyperlipidemia Low dose of Crestor Needs Lipid Panel  6. Overactive bladder Myrbetriq  7. Acquired hypothyroidism TSH normal in 11/23  8. Primary insomnia Melatonin     Labs/tests ordered:  CBC,CMP,Lipid TSh Next appt:  Visit date not found

## 2023-07-25 ENCOUNTER — Encounter: Payer: Self-pay | Admitting: Internal Medicine

## 2023-07-29 DIAGNOSIS — R946 Abnormal results of thyroid function studies: Secondary | ICD-10-CM | POA: Diagnosis not present

## 2023-07-29 DIAGNOSIS — R799 Abnormal finding of blood chemistry, unspecified: Secondary | ICD-10-CM | POA: Diagnosis not present

## 2023-07-29 DIAGNOSIS — Z130 Encounter for screening for diseases of the blood and blood-forming organs and certain disorders involving the immune mechanism: Secondary | ICD-10-CM | POA: Diagnosis not present

## 2023-08-15 ENCOUNTER — Non-Acute Institutional Stay (SKILLED_NURSING_FACILITY): Payer: Medicare HMO | Admitting: Adult Health

## 2023-08-15 ENCOUNTER — Encounter: Payer: Self-pay | Admitting: Adult Health

## 2023-08-15 DIAGNOSIS — F0390 Unspecified dementia without behavioral disturbance: Secondary | ICD-10-CM | POA: Diagnosis not present

## 2023-08-15 NOTE — Progress Notes (Signed)
Location:  Medical illustrator of Service:  SNF (31) Provider:   Peggye Ley, ANP [facility] [phone]   Mahlon Gammon, MD  Patient Care Team: Mahlon Gammon, MD as PCP - General (Internal Medicine)  Extended Emergency Contact Information Primary Emergency Contact: Dosher,Wallace Address: [address]          [place], [place] [address] [place] of [place] Home Phone: [phone] Mobile Phone: [phone] Relation: Spouse Secondary Emergency Contact: Brady,Yue Address: [address]          [place], [place] Mobile Phone: [phone] Relation: Daughter  Code Status:  DNR Goals of care: Advanced Directive information    07/15/2023   11:03 AM  Advanced Directives  Does Patient Have a Medical Advance Directive? Yes  Type of Estate agent of [place];Out of facility DNR (pink MOST or yellow form)  Does patient want to make changes to medical advance directive? No - Patient declined  Copy of Healthcare Power of Attorney in Chart? Yes - validated most recent copy scanned in chart (See row information)     Chief Complaint  Patient presents with   Acute Visit    Evaluate mood    HPI:  Pt is a 88 y.o. female seen today for an acute visit for evaluation of mood. Her husband expressed concerns to the nurse that his wife is depressed. Sometimes she is not talkative and has a flat affect.  The nurse has not noted any crying, anxiety, or lack of appetite. Also on weight loss or lack of sleep. During my visit she denied depression. Does feel down occasionally. She participates in activities in the living room and comes out of her room often.  Resides in memory care due to dementia. MOCA 20/30 02/20/23 Walks with a walker no aggression. Has a shuffling gait.  04/24/21 MRI of the brain MRI brain (without) demonstrating: -Moderate to severe atrophy and moderate ventriculomegaly on ex vacuo  basis. -Moderate chronic small vessel ischemic disease. -No acute findings. Past Medical History:  Diagnosis Date   Anemia    Bowel obstruction (HCC)    Calculus of gallbladder with acute cholecystitis, without mention of obstruction 10/01/2013   Open chole on 10/26/13. 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    COPD (chronic obstructive pulmonary disease) (HCC)    Depression    "sold beach house 01/2012" (10/25/2013)   Gall stones    Goiter    H/O sebaceous cyst    Hepatic cyst 10/01/2013   CT   History of diverticular abscess 09/2013   Hyperlipemia    Hypertension    "was taking RX; took me off it 09/2013" (10/25/2013)   Hypothyroidism    goiter   Insomnia    MCI (mild cognitive impairment)    Osteopenia    Protein calorie malnutrition (HCC)    malabsorption   Seasonal allergies    Vitamin B12 deficiency    Past Surgical History:  Procedure Laterality Date   ABCESS DRAINAGE  2014   "had drain put in & stayed for ~ 1 wk to 10 day; just came out ~ 10 days ago" (10/25/2013)   CATARACT EXTRACTION W/ INTRAOCULAR LENS IMPLANT Left ~ 2010   CHOLECYSTECTOMY N/A 10/26/2013   Procedure: ATTEMPTED  LAPAROSCOPIC CONVERTED TO OPEN CHOLECYSTECTOMY WITH INTRAOPERATIVE CHOLANGIOGRAM; REPAIR OF CHOLECOLONIC FISTULA; cholecystoduodenal fistula repair; lysis of adhesions; drainage of pelvic abscess; omental patching of duodenal repair;  Surgeon: Maisie Fus  Liberty Handy, MD;  Location: [facility] OR;  Service: General;  Laterality: N/A;   COLON RESECTION  2014   DILATION AND CURETTAGE OF UTERUS     EXCISION MASS NECK Left 10/31/2016   Procedure: EXCISION CYST LEFT NECK;  Surgeon: Harriette Bouillon, MD;  Location: [facility];  Service: General;  Laterality: Left;  EXCISION CYST LEFT NECK    Allergies  Allergen Reactions   Ambien [Zolpidem Tartrate] Other (See Comments)    Makes her unsteady   Cymbalta [Duloxetine Hcl] Other (See  Comments)    "kinda went nuts"   Halcion [Triazolam] Other (See Comments)    "kinda went nuts"    Outpatient Encounter Medications as of 08/15/2023  Medication Sig   Cholecalciferol (VITAMIN D3) 50 MCG (2000 UT) TABS Take 1 tablet by mouth daily.   Cyanocobalamin (VITAMIN B 12 PO) Take 2,000 mcg by mouth daily.   levothyroxine (SYNTHROID) 50 MCG tablet Take 50 mcg by mouth daily before breakfast.   losartan (COZAAR) 50 MG tablet Take 1 tablet (50 mg total) by mouth daily.   Melatonin 5 MG CAPS Take 5 mg by mouth at bedtime.   memantine (NAMENDA) 5 MG tablet Take 2 tablets (10 mg total) by mouth 2 (two) times daily.   mirabegron ER (MYRBETRIQ) 25 MG TB24 tablet Take 25 mg by mouth daily.   rosuvastatin (CRESTOR) 10 MG tablet Take 10 mg by mouth daily.   No facility-administered encounter medications on file as of 08/15/2023.    Review of Systems  Constitutional:  Negative for activity change, appetite change, chills, diaphoresis, fatigue, fever and unexpected weight change.  HENT:  Negative for congestion.   Respiratory:  Negative for cough, shortness of breath and wheezing.   Cardiovascular:  Negative for chest pain, palpitations and leg swelling.  Gastrointestinal:  Negative for abdominal distention, abdominal pain, constipation and diarrhea.  Genitourinary:  Negative for difficulty urinating and dysuria.  Musculoskeletal:  Positive for gait problem. Negative for arthralgias, back pain, joint swelling and myalgias.  Neurological:  Negative for dizziness, tremors, seizures, syncope, facial asymmetry, speech difficulty, weakness, light-headedness, numbness and headaches.  Psychiatric/Behavioral:  Positive for confusion. Negative for agitation, behavioral problems, dysphoric mood, hallucinations, self-injury and sleep disturbance. The patient is not nervous/anxious and is not hyperactive.     Immunization History  Administered Date(s) Administered   Influenza Split 11/22/2011,  09/30/2013   Influenza, Quadrivalent, Recombinant, Inj, Pf 10/08/2019, 09/14/2020   Influenza,inj,Quad PF,6+ Mos 10/03/2013   Influenza-Unspecified 08/23/2016, 09/20/2022   MODERNA COVID-19 SARS-COV-2 PEDS BIVALENT BOOSTER 73yr-72yr 09/28/2021   Moderna SARS-COV2 Booster Vaccination 10/31/2020   Moderna Sars-Covid-2 Vaccination 12/28/2019, 01/26/2020, 10/21/2022   Pneumococcal Conjugate-13 01/09/2016   Pneumococcal Polysaccharide-23 08/12/2012, 10/03/2013   Td 08/16/2010   Tdap 01/16/2023   Zoster Recombinant(Shingrix) 10/15/2022, 01/16/2023   Zoster, Live 10/07/2006   Pertinent  Health Maintenance Due  Topic Date Due   INFLUENZA VACCINE  07/17/2023   DEXA SCAN  02/21/2024 (Originally 09/16/2002)      04/18/2021   11:56 AM 03/06/2023    3:17 PM 04/25/2023   10:58 AM 07/15/2023    2:29 PM  Fall Risk  Falls in the past year?  0 0 1  Was there an injury with Fall?  0 0 0  Fall Risk Category Calculator  0 0 1  (RETIRED) Patient Fall Risk Level Low fall risk     Patient at Risk for Falls Due to  Impaired balance/gait History of fall(s);Impaired balance/gait;Impaired mobility History of fall(s);Mental status  change;Impaired balance/gait;Impaired mobility  Fall risk Follow up  Falls evaluation completed Falls evaluation completed Falls evaluation completed;Education provided;Falls prevention discussed   Functional Status Survey:    Vitals:   08/15/23 1154  BP: 130/83  Pulse: 85  Resp: 18  Temp: 98.1 F (36.7 C)  SpO2: 93%   There is no height or weight on file to calculate BMI. Physical Exam Vitals and nursing note reviewed.  Constitutional:      Appearance: Normal appearance. She is normal weight.  Neurological:     General: No focal deficit present.     Mental Status: She is alert. Mental status is at baseline.     Comments: shuffles     Labs reviewed: Recent Labs    01/15/23 0000 06/10/23 0000  NA 140 136*  K 4.2 5.5*  CL 106 100  CO2 23* 18  BUN 12 20   CREATININE 0.7 1.0  CALCIUM 9.0 9.1   Recent Labs    01/15/23 0000 06/10/23 0000  AST 18 85*  ALT 8 48*  ALKPHOS 64 113  ALBUMIN  --  3.4*   Recent Labs    01/15/23 0000 06/10/23 0000  WBC 7.8 13.6  HGB 13.2 14.4  HCT 39 44  PLT 202 175   Lab Results  Component Value Date   TSH 4.07 11/13/2022   No results found for: "HGBA1C" Lab Results  Component Value Date   TRIG 142 11/04/2013    Significant Diagnostic Results in last 30 days:  No results found.  Assessment/Plan  1. Dementia without behavioral disturbance (HCC) At this time she does not exhibit signs of depression. We will certainly monitor for this and follow up if indicated.   Family/ staff Communication: Discussed with her daughter Ms. Huston Foley. She is not inclined to feel her mother is depressed and does not wish to start new medication at this time. We both agreed that her symptoms are likely related to progressing dementia. She asked that I call her father (resident's husband Koren Bound). I attempted to but he did not answer the phone and did not have voicemail. I went over our recommendation to monitor for depression and share education regarding the symptoms of dementia with the nurse and she will communicate with [facility].   Labs/tests ordered:  NA

## 2023-09-05 ENCOUNTER — Non-Acute Institutional Stay (SKILLED_NURSING_FACILITY): Payer: Self-pay | Admitting: Adult Health

## 2023-09-05 ENCOUNTER — Encounter: Payer: Self-pay | Admitting: Adult Health

## 2023-09-05 DIAGNOSIS — I10 Essential (primary) hypertension: Secondary | ICD-10-CM | POA: Diagnosis not present

## 2023-09-05 DIAGNOSIS — E039 Hypothyroidism, unspecified: Secondary | ICD-10-CM | POA: Diagnosis not present

## 2023-09-05 DIAGNOSIS — F03B0 Unspecified dementia, moderate, without behavioral disturbance, psychotic disturbance, mood disturbance, and anxiety: Secondary | ICD-10-CM

## 2023-09-05 DIAGNOSIS — E782 Mixed hyperlipidemia: Secondary | ICD-10-CM

## 2023-09-05 NOTE — Progress Notes (Unsigned)
Location:  Medical illustrator of Service:  SNF (31) Provider:  Peggye Ley, NP  PCP: Mahlon Gammon, MD  Patient Care Team: Mahlon Gammon, MD as PCP - General (Internal Medicine)  Extended Emergency Contact Information Primary Emergency Contact: Wrede,Wallace Address: [address]          [place], [place] [address] [place] of [place] Home Phone: [phone] Mobile Phone: [phone] Relation: Spouse Secondary Emergency Contact: Brady,Hadiya Address: [address]          [place], [place] Mobile Phone: [phone] Relation: Daughter  Code Status:  DNR Goals of care: Advanced Directive information    07/15/2023   11:03 AM  Advanced Directives  Does Patient Have a Medical Advance Directive? Yes  Type of Estate agent of [place];Out of facility DNR (pink MOST or yellow form)  Does patient want to make changes to medical advance directive? No - Patient declined  Copy of Healthcare Power of Attorney in Chart? Yes - validated most recent copy scanned in chart (See row information)     Chief Complaint  Patient presents with  . Medical Management of Chronic Issues    HPI:  Pt is a 88 y.o. female seen today for medical management of chronic diseases.    Resides in memory care due to dementia. MOCA 20/30 02/20/23 Walks with a walker no aggression. Has a shuffling gait.  Has tried sinemet for slow movement and bradykinesia with no benefit.  MRI 04/24/21 showed sgeneralized atrophy, ventriculomegaly, cervical spine showed multilevel degenerative changes, Likely central nervous system degenerative disorder, with vascular component.Had a declined after intrabd infection in 2014 with prolonged TPN.  Update: weight stable  Wt Readings from Last 3 Encounters:  09/05/23 139 lb 3.2 oz (63.1 kg)  07/21/23 136 lb (61.7 kg)  07/15/23 137 lb 3.2 oz (62.2 kg)     COPD: hx of smoking, no current symptoms  of cough or sob.   HTN:    BPs systolic 120-130s recently which improved.   Hypothyroidism:  Lab Results  Component Value Date   TSH 4.07 11/13/2022   Hx of goiter.   HLD LDL 60 August 2023 Past Medical History:  Diagnosis Date  . Anemia   . Bowel obstruction (HCC)   . Calculus of gallbladder with acute cholecystitis, without mention of obstruction 10/01/2013   Open chole on 10/26/13. 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 COPD (chronic obstructive pulmonary disease) (HCC)   . Depression    "sold beach house 01/2012" (10/25/2013)  . Gall stones   . Goiter   . H/O sebaceous cyst   . Hepatic cyst 10/01/2013   CT  . History of diverticular abscess 09/2013  . Hyperlipemia   . Hypertension    "was taking RX; took me off it 09/2013" (10/25/2013)  . Hypothyroidism    goiter  . Insomnia   . MCI (mild cognitive impairment)   . Osteopenia   . Protein calorie malnutrition (HCC)    malabsorption  . Seasonal allergies   . Vitamin B12 deficiency    Past Surgical History:  Procedure Laterality Date  . ABCESS DRAINAGE  2014   "had drain put in & stayed for ~ 1 wk to 10 day; just came out ~ 10 days ago" (10/25/2013)  . CATARACT EXTRACTION W/ INTRAOCULAR LENS IMPLANT Left ~ 2010  . CHOLECYSTECTOMY N/A 10/26/2013   Procedure: ATTEMPTED  LAPAROSCOPIC CONVERTED TO OPEN CHOLECYSTECTOMY  WITH INTRAOPERATIVE CHOLANGIOGRAM; REPAIR OF CHOLECOLONIC FISTULA; cholecystoduodenal fistula repair; lysis of adhesions; drainage of pelvic abscess; omental patching of duodenal repair;  Surgeon: Clovis Pu. Cornett, MD;  Location: [facility] OR;  Service: General;  Laterality: N/A;  . COLON RESECTION  2014  . DILATION AND CURETTAGE OF UTERUS    . EXCISION MASS NECK Left 10/31/2016   Procedure: EXCISION CYST LEFT NECK;  Surgeon: Harriette Bouillon, MD;  Location: [facility];  Service: General;  Laterality: Left;  EXCISION CYST LEFT NECK     Allergies  Allergen Reactions  . Ambien [Zolpidem Tartrate] Other (See Comments)    Makes her unsteady  . Cymbalta [Duloxetine Hcl] Other (See Comments)    "kinda went nuts"  . Halcion [Triazolam] Other (See Comments)    "kinda went nuts"    Outpatient Encounter Medications as of 09/05/2023  Medication Sig  . Cholecalciferol (VITAMIN D3) 50 MCG (2000 UT) TABS Take 1 tablet by mouth daily.  . Cyanocobalamin (VITAMIN B 12 PO) Take 2,000 mcg by mouth daily.  Marland Kitchen levothyroxine (SYNTHROID) 50 MCG tablet Take 50 mcg by mouth daily before breakfast.  . losartan (COZAAR) 50 MG tablet Take 1 tablet (50 mg total) by mouth daily.  . Melatonin 5 MG CAPS Take 5 mg by mouth at bedtime.  . memantine (NAMENDA) 5 MG tablet Take 2 tablets (10 mg total) by mouth 2 (two) times daily.  . mirabegron ER (MYRBETRIQ) 25 MG TB24 tablet Take 25 mg by mouth daily.  . rosuvastatin (CRESTOR) 10 MG tablet Take 10 mg by mouth daily.   No facility-administered encounter medications on file as of 09/05/2023.    Review of Systems  Constitutional:  Negative for activity change, appetite change, chills, diaphoresis, fatigue, fever and unexpected weight change.  HENT:  Negative for congestion.   Respiratory:  Negative for cough, shortness of breath and wheezing.   Cardiovascular:  Negative for chest pain, palpitations and leg swelling.  Gastrointestinal:  Negative for abdominal distention, abdominal pain, constipation and diarrhea.  Genitourinary:  Negative for difficulty urinating and dysuria.  Musculoskeletal:  Positive for gait problem. Negative for arthralgias, back pain, joint swelling and myalgias.  Neurological:  Negative for dizziness, tremors, seizures, syncope, facial asymmetry, speech difficulty, weakness, light-headedness, numbness and headaches.  Psychiatric/Behavioral:  Positive for confusion. Negative for agitation and behavioral problems.     Immunization History  Administered Date(s) Administered   . Influenza Split 11/22/2011, 09/30/2013  . Influenza, Quadrivalent, Recombinant, Inj, Pf 10/08/2019, 09/14/2020  . Influenza,inj,Quad PF,6+ Mos 10/03/2013  . Influenza-Unspecified 08/23/2016, 09/20/2022  . MODERNA COVID-19 SARS-COV-2 PEDS BIVALENT BOOSTER 29yr-44yr 09/28/2021  . Moderna SARS-COV2 Booster Vaccination 10/31/2020  . Moderna Sars-Covid-2 Vaccination 12/28/2019, 01/26/2020, 10/21/2022  . Pneumococcal Conjugate-13 01/09/2016  . Pneumococcal Polysaccharide-23 08/12/2012, 10/03/2013  . Td 08/16/2010  . Tdap 01/16/2023  . Zoster Recombinant(Shingrix) 10/15/2022, 01/16/2023  . Zoster, Live 10/07/2006   Pertinent  Health Maintenance Due  Topic Date Due  . INFLUENZA VACCINE  07/17/2023  . DEXA SCAN  02/21/2024 (Originally 09/16/2002)      04/18/2021   11:56 AM 03/06/2023    3:17 PM 04/25/2023   10:58 AM 07/15/2023    2:29 PM  Fall Risk  Falls in the past year?  0 0 1  Was there an injury with Fall?  0 0 0  Fall Risk Category Calculator  0 0 1  (RETIRED) Patient Fall Risk Level Low fall risk     Patient at Risk for Falls Due to  Impaired  balance/gait History of fall(s);Impaired balance/gait;Impaired mobility History of fall(s);Mental status change;Impaired balance/gait;Impaired mobility  Fall risk Follow up  Falls evaluation completed Falls evaluation completed Falls evaluation completed;Education provided;Falls prevention discussed   Functional Status Survey:    Vitals:   09/05/23 1150  BP: 128/81  Pulse: 79  Resp: 16  Temp: 97.8 F (36.6 C)  SpO2: 96%  Weight: 139 lb 3.2 oz (63.1 kg)   Body mass index is 23.89 kg/m. Physical Exam Vitals and nursing note reviewed.  Constitutional:      General: She is not in acute distress.    Appearance: She is not diaphoretic.  HENT:     Head: Normocephalic and atraumatic.  Neck:     Vascular: No JVD.  Cardiovascular:     Rate and Rhythm: Normal rate and regular rhythm.     Heart sounds: No murmur heard. Pulmonary:      Effort: Pulmonary effort is normal. No respiratory distress.     Breath sounds: Normal breath sounds. No wheezing.  Abdominal:     General: Bowel sounds are normal. There is no distension.     Palpations: Abdomen is soft.     Tenderness: There is no abdominal tenderness.  Musculoskeletal:     Right lower leg: No edema.     Left lower leg: No edema.  Skin:    General: Skin is warm and dry.  Neurological:     General: No focal deficit present.     Mental Status: She is alert. Mental status is at baseline.  Psychiatric:        Mood and Affect: Mood normal.    Labs reviewed: Recent Labs    01/15/23 0000 06/10/23 0000  NA 140 136*  K 4.2 5.5*  CL 106 100  CO2 23* 18  BUN 12 20  CREATININE 0.7 1.0  CALCIUM 9.0 9.1   Recent Labs    01/15/23 0000 06/10/23 0000  AST 18 85*  ALT 8 48*  ALKPHOS 64 113  ALBUMIN  --  3.4*   Recent Labs    01/15/23 0000 06/10/23 0000  WBC 7.8 13.6  HGB 13.2 14.4  HCT 39 44  PLT 202 175   Lab Results  Component Value Date   TSH 4.07 11/13/2022   No results found for: "HGBA1C" Lab Results  Component Value Date   TRIG 142 11/04/2013    Significant Diagnostic Results in last 30 days:  No results found.  Assessment/Plan   Essential hypertension, benign Improved Continue losartan   Hyperkalemia Mild Will recheck K  Dementia without behavioral disturbance (HCC) Progressive decline in cognition and physical function c/w the disease. Continue supportive care in the skilled environment. Followed by neurology [facility] [place].   Hypothyroidism Hx of goiter Continue synthroid 50 mcg qd  Overactive bladder Continue myrbetriq   Mixed hyperlipidemia Continue crestor   Family/ staff Communication: nurse  Labs/tests ordered:  recheck

## 2023-09-15 ENCOUNTER — Encounter: Payer: Self-pay | Admitting: Internal Medicine

## 2023-09-15 ENCOUNTER — Non-Acute Institutional Stay (SKILLED_NURSING_FACILITY): Payer: Medicare HMO | Admitting: Internal Medicine

## 2023-09-15 DIAGNOSIS — L299 Pruritus, unspecified: Secondary | ICD-10-CM

## 2023-09-15 DIAGNOSIS — I10 Essential (primary) hypertension: Secondary | ICD-10-CM | POA: Diagnosis not present

## 2023-09-15 DIAGNOSIS — F03B0 Unspecified dementia, moderate, without behavioral disturbance, psychotic disturbance, mood disturbance, and anxiety: Secondary | ICD-10-CM

## 2023-09-15 DIAGNOSIS — F32A Depression, unspecified: Secondary | ICD-10-CM | POA: Diagnosis not present

## 2023-09-15 DIAGNOSIS — E039 Hypothyroidism, unspecified: Secondary | ICD-10-CM

## 2023-09-15 NOTE — Progress Notes (Signed)
Location: [facility] Room Number: 312A Place of Service:  SNF [phone])  Provider: Mahlon Gammon, MD   Code Status: DNR Goals of Care:     09/15/2023    4:57 PM  Advanced Directives  Does Patient Have a Medical Advance Directive? Yes  Type of Estate agent of [place];Out of facility DNR (pink MOST or yellow form)  Does patient want to make changes to medical advance directive? No - Patient declined  Copy of Healthcare Power of Attorney in Chart? Yes - validated most recent copy scanned in chart (See row information)     Chief Complaint  Patient presents with   Acute Visit    Patient is being seen for acute itching    Immunizations    Discuss the need for flu , and covid vaccine    HPI: Patient is a 88 y.o. female seen today for an acute visit for itchin gin her hands Back and Legs Also Possible Mild Depression per her husband  Lives in Memory unit in [place]   Patient has a history of dementia Recent MMSE score 19/30 in Neurology Office Recent MOCA 20/30 MRI of the brain has showed generalized atrophy ventricular megaly  Recently Husband thinks she has been more depressed Patient agreed that  She feels down   Also has been itching all over her body No Rash but her skin is dry and some areas where she has been trying to Itch Has some rash    Past history  Also has a history of extensive abdominal surgery in 2014 due to gangrenous gallbladder and fistula History of alcohol abuse,, hypertension, hyperlipidemia and urinary incontinence   Past Medical History:  Diagnosis Date   Anemia    Bowel obstruction (HCC)    Calculus of gallbladder with acute cholecystitis, without mention of obstruction 10/01/2013   Open chole on 10/26/13. 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    COPD (chronic obstructive pulmonary disease) (HCC)    Depression    "sold beach house  01/2012" (10/25/2013)   Gall stones    Goiter    H/O sebaceous cyst    Hepatic cyst 10/01/2013   CT   History of diverticular abscess 09/2013   Hyperlipemia    Hypertension    "was taking RX; took me off it 09/2013" (10/25/2013)   Hypothyroidism    goiter   Insomnia    MCI (mild cognitive impairment)    Osteopenia    Protein calorie malnutrition (HCC)    malabsorption   Seasonal allergies    Vitamin B12 deficiency     Past Surgical History:  Procedure Laterality Date   ABCESS DRAINAGE  2014   "had drain put in & stayed for ~ 1 wk to 10 day; just came out ~ 10 days ago" (10/25/2013)   CATARACT EXTRACTION W/ INTRAOCULAR LENS IMPLANT Left ~ 2010   CHOLECYSTECTOMY N/A 10/26/2013   Procedure: ATTEMPTED  LAPAROSCOPIC CONVERTED TO OPEN CHOLECYSTECTOMY WITH INTRAOPERATIVE CHOLANGIOGRAM; REPAIR OF CHOLECOLONIC FISTULA; cholecystoduodenal fistula repair; lysis of adhesions; drainage of pelvic abscess; omental patching of duodenal repair;  Surgeon: Clovis Pu. Cornett, MD;  Location: [facility] OR;  Service: General;  Laterality: N/A;   COLON RESECTION  2014   DILATION AND CURETTAGE OF UTERUS     EXCISION MASS NECK Left 10/31/2016   Procedure: EXCISION CYST LEFT NECK;  Surgeon: Harriette Bouillon, MD;  Location: [facility];  Service: General;  Laterality: Left;  EXCISION  CYST LEFT NECK    Allergies  Allergen Reactions   Ambien [Zolpidem Tartrate] Other (See Comments)    Makes her unsteady   Cymbalta [Duloxetine Hcl] Other (See Comments)    "kinda went nuts"   Halcion [Triazolam] Other (See Comments)    "kinda went nuts"    Outpatient Encounter Medications as of 09/15/2023  Medication Sig   Cholecalciferol (VITAMIN D3) 50 MCG (2000 UT) TABS Take 1 tablet by mouth daily.   Cyanocobalamin (VITAMIN B 12 PO) Take 2,000 mcg by mouth daily.   levothyroxine (SYNTHROID) 50 MCG tablet Take 50 mcg by mouth daily before breakfast.   losartan (COZAAR) 50 MG tablet Take 1 tablet (50 mg total)  by mouth daily.   Melatonin 5 MG CAPS Take 5 mg by mouth at bedtime.   memantine (NAMENDA) 5 MG tablet Take 2 tablets (10 mg total) by mouth 2 (two) times daily.   mirabegron ER (MYRBETRIQ) 25 MG TB24 tablet Take 25 mg by mouth daily.   rosuvastatin (CRESTOR) 10 MG tablet Take 10 mg by mouth daily.   No facility-administered encounter medications on file as of 09/15/2023.    Review of Systems:  Review of Systems  Constitutional:  Negative for activity change and appetite change.  HENT: Negative.    Respiratory:  Negative for cough and shortness of breath.   Cardiovascular:  Negative for leg swelling.  Gastrointestinal:  Negative for constipation.  Genitourinary: Negative.   Musculoskeletal:  Negative for arthralgias, gait problem and myalgias.  Skin: Negative.   Neurological:  Negative for dizziness and weakness.  Psychiatric/Behavioral:  Positive for confusion and dysphoric mood. Negative for sleep disturbance.     Health Maintenance  Topic Date Due   INFLUENZA VACCINE  07/17/2023   COVID-19 Vaccine (6 - 2023-24 season) 08/17/2023   DEXA SCAN  02/21/2024 (Originally 09/16/2002)   Medicare Annual Wellness (AWV)  03/05/2024   DTaP/Tdap/Td (3 - Td or Tdap) 01/16/2033   Pneumonia Vaccine 95+ Years old  Completed   Zoster Vaccines- Shingrix  Completed   HPV VACCINES  Aged Out    Physical Exam: Vitals:   09/15/23 1653  BP: (!) 143/83  Pulse: 83  Resp: 17  Temp: (!) 97.2 F (36.2 C)  TempSrc: Temporal  SpO2: 95%  Weight: 139 lb 3.2 oz (63.1 kg)  Height: 5\' 4"  (1.626 m)   Body mass index is 23.89 kg/m. Physical Exam Vitals reviewed.  Constitutional:      Appearance: Normal appearance.  HENT:     Head: Normocephalic.     Nose: Nose normal.     Mouth/Throat:     Mouth: Mucous membranes are moist.     Pharynx: Oropharynx is clear.  Eyes:     Pupils: Pupils are equal, round, and reactive to light.  Cardiovascular:     Rate and Rhythm: Normal rate and regular rhythm.      Pulses: Normal pulses.     Heart sounds: Normal heart sounds. No murmur heard. Pulmonary:     Effort: Pulmonary effort is normal.     Breath sounds: Normal breath sounds.  Abdominal:     General: Abdomen is flat. Bowel sounds are normal.     Palpations: Abdomen is soft.  Musculoskeletal:        General: No swelling.     Cervical back: Neck supple.  Skin:    General: Skin is warm.     Findings: No bruising, lesion or rash.  Neurological:     General: No focal  deficit present.     Mental Status: She is alert.  Psychiatric:        Mood and Affect: Mood normal.        Thought Content: Thought content normal.     Labs reviewed: Basic Metabolic Panel: Recent Labs    09/18/22 0000 11/13/22 0000 01/15/23 0000 06/10/23 0000  NA  --   --  140 136*  K  --   --  4.2 5.5*  CL  --   --  106 100  CO2  --   --  23* 18  BUN  --   --  12 20  CREATININE  --   --  0.7 1.0  CALCIUM  --   --  9.0 9.1  TSH 5.07 4.07  --   --    Liver Function Tests: Recent Labs    01/15/23 0000 06/10/23 0000  AST 18 85*  ALT 8 48*  ALKPHOS 64 113  ALBUMIN  --  3.4*   No results for input(s): "LIPASE", "AMYLASE" in the last 8760 hours. No results for input(s): "AMMONIA" in the last 8760 hours. CBC: Recent Labs    01/15/23 0000 06/10/23 0000  WBC 7.8 13.6  HGB 13.2 14.4  HCT 39 44  PLT 202 175   Lipid Panel: No results for input(s): "CHOL", "HDL", "LDLCALC", "TRIG", "CHOLHDL", "LDLDIRECT" in the last 8760 hours. No results found for: "HGBA1C"  Procedures since last visit: No results found.  Assessment/Plan 1. Itching No Rash noticed Sarna Lotion BID Possible can be related to Behaviors and Depression  2. Mild depression Will Start on Lexpro 5 mg every day BMP in 1 week  3. Moderate dementia, unspecified dementia type, unspecified whether behavioral, psychotic, or mood disturbance or anxiety (HCC) Namenda to help with Behavviors  4. Essential hypertension,  benign Cozaar  5. Acquired hypothyroidism TSH normal in 07/2023   Labs/tests ordered:  * No order type specified * Next appt:  Visit date not found

## 2023-09-23 ENCOUNTER — Encounter: Payer: Self-pay | Admitting: Internal Medicine

## 2023-09-23 DIAGNOSIS — Z13228 Encounter for screening for other metabolic disorders: Secondary | ICD-10-CM | POA: Diagnosis not present

## 2023-09-23 LAB — COMPREHENSIVE METABOLIC PANEL
Albumin: 4 (ref 3.5–5.0)
Calcium: 9.5 (ref 8.7–10.7)
Globulin: 1.9
eGFR: 85

## 2023-09-23 LAB — HEPATIC FUNCTION PANEL
ALT: 12 U/L (ref 7–35)
AST: 23 (ref 13–35)
Alkaline Phosphatase: 82 (ref 25–125)
Bilirubin, Total: 0.5

## 2023-09-23 LAB — BASIC METABOLIC PANEL
BUN: 11 (ref 4–21)
CO2: 21 (ref 13–22)
Chloride: 102 (ref 99–108)
Creatinine: 0.7 (ref 0.5–1.1)
Glucose: 112
Potassium: 4.1 meq/L (ref 3.5–5.1)
Sodium: 138 (ref 137–147)

## 2023-10-01 DIAGNOSIS — L309 Dermatitis, unspecified: Secondary | ICD-10-CM | POA: Diagnosis not present

## 2023-10-06 ENCOUNTER — Encounter: Payer: Self-pay | Admitting: Adult Health

## 2023-10-06 ENCOUNTER — Non-Acute Institutional Stay (SKILLED_NURSING_FACILITY): Payer: Self-pay | Admitting: Adult Health

## 2023-10-06 DIAGNOSIS — F32A Depression, unspecified: Secondary | ICD-10-CM | POA: Diagnosis not present

## 2023-10-06 DIAGNOSIS — I10 Essential (primary) hypertension: Secondary | ICD-10-CM

## 2023-10-06 DIAGNOSIS — E782 Mixed hyperlipidemia: Secondary | ICD-10-CM

## 2023-10-06 DIAGNOSIS — E039 Hypothyroidism, unspecified: Secondary | ICD-10-CM

## 2023-10-06 DIAGNOSIS — L539 Erythematous condition, unspecified: Secondary | ICD-10-CM | POA: Diagnosis not present

## 2023-10-06 DIAGNOSIS — F01B3 Vascular dementia, moderate, with mood disturbance: Secondary | ICD-10-CM | POA: Diagnosis not present

## 2023-10-06 NOTE — Progress Notes (Signed)
Location:  [facility] Room Number: 312A Place of Service:  SNF [phone]) Provider:  Tamsen Roers, MD  Patient Care Team: Mahlon Gammon, MD as PCP - General (Internal Medicine)  Extended Emergency Contact Information Primary Emergency Contact: Spanos,Wallace Address: [address]          [place], [place] [address] [place] Home Phone: [phone] Mobile Phone: [phone] Relation: Spouse Secondary Emergency Contact: Brady,Renai Address: [address]          [place], [place] Mobile Phone: [phone] Relation: Daughter  Code Status:  DNR Goals of care: Advanced Directive information    10/06/2023    9:25 AM  Advanced Directives  Does Patient Have a Medical Advance Directive? Yes  Type of Estate agent of [place];Out of facility DNR (pink MOST or yellow form)  Does patient want to make changes to medical advance directive? No - Patient declined  Copy of Healthcare Power of Attorney in Chart? Yes - validated most recent copy scanned in chart (See row information)     Chief Complaint  Patient presents with   Medical Management of Chronic Issues    Patient is being seen for a routine visit   Immunizations    Patient is due for flu and covid     HPI:  Pt is a 88 y.o. female seen today for medical management of chronic diseases.    BP running 140-160's systolic 70-80 diastolic on the automatic cuff. Repeat bp with manual today was 122/70  No edema, cp, or sob  Saw dermatology for itching and a rash. Triamcinolone prescribed. No rash noted per nurse.  Does have some redness to her buttocks due to incontinence and is using Endit cream.   She was started on Lexapro by Dr Chales Abrahams on 09/15/23 due to concerns for depression per her husband. The nurse has not noted  any changes in her mood. She is flat most of the time. No crying or agitation. Repeat BMP  after starting Lexapro was normal  Resides in memory care due to dementia. MOCA 20/30 02/20/23, MMSE 15/30 07/29/23 Walks with a walker no aggression. Has a shuffling gait.  Has tried sinemet for slow movement and bradykinesia with no benefit.  MRI 04/24/21 showed generalized atrophy, ventriculomegaly, cervical spine showed multilevel degenerative changes, Likely central nervous system degenerative disorder, with vascular component.Had a declined after intrabd infection in 2014 with prolonged TPN. COPD: hx of smoking, no current symptoms of cough or sob.   Hypothyroidism: TSH 2.13 07/29/23   Hx of goiter.   HLD LDL 55 07/29/23  Wt Readings from Last 3 Encounters:  10/06/23 136 lb 12.8 oz (62.1 kg)  09/15/23 139 lb 3.2 oz (63.1 kg)  09/05/23 139 lb 3.2 oz (63.1 kg)    OAB: no issues with frequency reported. Has incontinence of urine and stool  Bone density declined by POA Mammogram aged out Colonoscopy aged out Past Medical History:  Diagnosis Date   Anemia    Bowel obstruction (HCC)    Calculus of gallbladder with acute cholecystitis, without mention of obstruction 10/01/2013   Open chole on 10/26/13. 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    COPD (chronic obstructive pulmonary disease) (HCC)    Depression    "sold beach house 01/2012" (10/25/2013)   Gall stones    Goiter    H/O sebaceous cyst    Hepatic cyst 10/01/2013   CT  History of diverticular abscess 09/2013   Hyperlipemia    Hypertension    "was taking RX; took me off it 09/2013" (10/25/2013)   Hypothyroidism    goiter   Insomnia    MCI (mild cognitive impairment)    Osteopenia    Protein calorie malnutrition (HCC)    malabsorption   Seasonal allergies    Vitamin B12 deficiency    Past Surgical History:  Procedure Laterality Date   ABCESS DRAINAGE  2014   "had drain put in & stayed for ~ 1 wk to 10 day; just came out ~ 10 days ago" (10/25/2013)    CATARACT EXTRACTION W/ INTRAOCULAR LENS IMPLANT Left ~ 2010   CHOLECYSTECTOMY N/A 10/26/2013   Procedure: ATTEMPTED  LAPAROSCOPIC CONVERTED TO OPEN CHOLECYSTECTOMY WITH INTRAOPERATIVE CHOLANGIOGRAM; REPAIR OF CHOLECOLONIC FISTULA; cholecystoduodenal fistula repair; lysis of adhesions; drainage of pelvic abscess; omental patching of duodenal repair;  Surgeon: Clovis Pu. Cornett, MD;  Location: [facility] OR;  Service: General;  Laterality: N/A;   COLON RESECTION  2014   DILATION AND CURETTAGE OF UTERUS     EXCISION MASS NECK Left 10/31/2016   Procedure: EXCISION CYST LEFT NECK;  Surgeon: Harriette Bouillon, MD;  Location: [facility];  Service: General;  Laterality: Left;  EXCISION CYST LEFT NECK    Allergies  Allergen Reactions   Ambien [Zolpidem Tartrate] Other (See Comments)    Makes her unsteady   Cymbalta [Duloxetine Hcl] Other (See Comments)    "kinda went nuts"   Halcion [Triazolam] Other (See Comments)    "kinda went nuts"    Outpatient Encounter Medications as of 10/06/2023  Medication Sig   camphor-menthol (SARNA) lotion Apply 1 Application topically as needed for itching.   Cholecalciferol (VITAMIN D3) 50 MCG (2000 UT) TABS Take 1 tablet by mouth daily.   Cyanocobalamin (VITAMIN B 12 PO) Take 2,000 mcg by mouth daily.   escitalopram (LEXAPRO) 5 MG tablet Take 5 mg by mouth daily.   levothyroxine (SYNTHROID) 50 MCG tablet Take 50 mcg by mouth daily before breakfast.   losartan (COZAAR) 50 MG tablet Take 1 tablet (50 mg total) by mouth daily.   Melatonin 5 MG CAPS Take 5 mg by mouth at bedtime.   memantine (NAMENDA) 5 MG tablet Take 2 tablets (10 mg total) by mouth 2 (two) times daily.   mirabegron ER (MYRBETRIQ) 25 MG TB24 tablet Take 25 mg by mouth daily.   rosuvastatin (CRESTOR) 10 MG tablet Take 10 mg by mouth daily.   triamcinolone cream (KENALOG) 0.1 % Apply 1 Application topically 2 (two) times daily.   No facility-administered encounter medications on file as of  10/06/2023.    Review of Systems  Constitutional:  Negative for activity change, appetite change, chills, diaphoresis, fatigue, fever and unexpected weight change.  HENT:  Negative for congestion.   Eyes:  Negative for visual disturbance.  Respiratory:  Negative for cough, shortness of breath and wheezing.   Cardiovascular:  Negative for chest pain, palpitations and leg swelling.  Gastrointestinal:  Negative for abdominal distention, abdominal pain, constipation and diarrhea.  Genitourinary:  Negative for difficulty urinating and dysuria.  Musculoskeletal:  Positive for gait problem. Negative for arthralgias, back pain, joint swelling and myalgias.  Skin:  Positive for color change (erythema to buttocks).  Neurological:  Negative for dizziness, tremors, seizures, syncope, facial asymmetry, speech difficulty, weakness, light-headedness, numbness and headaches.  Psychiatric/Behavioral:  Positive for confusion. Negative for agitation, behavioral problems, dysphoric mood, hallucinations and sleep disturbance.  Immunization History  Administered Date(s) Administered   Influenza Split 11/22/2011, 09/30/2013   Influenza, Quadrivalent, Recombinant, Inj, Pf 10/08/2019, 09/14/2020   Influenza,inj,Quad PF,6+ Mos 10/03/2013   Influenza-Unspecified 08/23/2016, 09/20/2022   MODERNA COVID-19 SARS-COV-2 PEDS BIVALENT BOOSTER 69yr-20yr 09/28/2021   Moderna SARS-COV2 Booster Vaccination 10/31/2020   Moderna Sars-Covid-2 Vaccination 12/28/2019, 01/26/2020, 10/21/2022   Pneumococcal Conjugate-13 01/09/2016   Pneumococcal Polysaccharide-23 08/12/2012, 10/03/2013   Td 08/16/2010   Tdap 01/16/2023   Zoster Recombinant(Shingrix) 10/15/2022, 01/16/2023   Zoster, Live 10/07/2006   Pertinent  Health Maintenance Due  Topic Date Due   INFLUENZA VACCINE  07/17/2023   DEXA SCAN  02/21/2024 (Originally 09/16/2002)      04/18/2021   11:56 AM 03/06/2023    3:17 PM 04/25/2023   10:58 AM 07/15/2023    2:29 PM   Fall Risk  Falls in the past year?  0 0 1  Was there an injury with Fall?  0 0 0  Fall Risk Category Calculator  0 0 1  (RETIRED) Patient Fall Risk Level Low fall risk     Patient at Risk for Falls Due to  Impaired balance/gait History of fall(s);Impaired balance/gait;Impaired mobility History of fall(s);Mental status change;Impaired balance/gait;Impaired mobility  Fall risk Follow up  Falls evaluation completed Falls evaluation completed Falls evaluation completed;Education provided;Falls prevention discussed   Functional Status Survey:    Vitals:   10/06/23 0920 10/06/23 1124  BP: (!) 162/82 122/70  Pulse: 80   Resp: 16   Temp: 98 F (36.7 C)   TempSrc: Temporal   SpO2: 98%   Weight: 136 lb 12.8 oz (62.1 kg)   Height: 5\' 4"  (1.626 m)    Body mass index is 23.48 kg/m. Physical Exam Vitals and nursing note reviewed.  Constitutional:      General: She is not in acute distress.    Appearance: She is not diaphoretic.  HENT:     Head: Normocephalic and atraumatic.  Neck:     Vascular: No JVD.  Cardiovascular:     Rate and Rhythm: Normal rate and regular rhythm.     Heart sounds: No murmur heard. Pulmonary:     Effort: Pulmonary effort is normal. No respiratory distress.     Breath sounds: Normal breath sounds. No wheezing.  Musculoskeletal:     Right lower leg: No edema.     Left lower leg: No edema.  Skin:    General: Skin is warm and dry.     Findings: Erythema (buttock and sacral area. NO open areas.) present.  Neurological:     General: No focal deficit present.     Mental Status: She is alert. Mental status is at baseline.    Labs reviewed: Recent Labs    01/15/23 0000 06/10/23 0000 09/23/23 0000  NA 140 136* 138  K 4.2 5.5* 4.1  CL 106 100 102  CO2 23* 18 21  BUN 12 20 11   CREATININE 0.7 1.0 0.7  CALCIUM 9.0 9.1 9.5   Recent Labs    01/15/23 0000 06/10/23 0000 09/23/23 0000  AST 18 85* 23  ALT 8 48* 12  ALKPHOS 64 113 82  ALBUMIN  --  3.4*  4.0   Recent Labs    01/15/23 0000 06/10/23 0000  WBC 7.8 13.6  HGB 13.2 14.4  HCT 39 44  PLT 202 175   Lab Results  Component Value Date   TSH 4.07 11/13/2022   No results found for: "HGBA1C" Lab Results  Component Value Date  TRIG 142 11/04/2013    Significant Diagnostic Results in last 30 days:  No results found.  Assessment/Plan  1. Essential hypertension, benign Use manual bp cuff for measurements when this is done her bp is normal Continue losartan  2. Mild depression Continue Lexapro, needs more time to assess   3. Erythema Barrier cream to buttocks due to incontinence   4. Mixed hyperlipidemia Continue Crestor   5. Acquired hypothyroidism Continue Synthroid   6. Moderate vascular dementia with mood disturbance (HCC) Progressive decline in cognition and physical function c/w the disease. Continue supportive care in the skilled environment.    Family/ staff Communication: resident and nurse.   Labs/tests ordered:  TSH due in Nov

## 2023-11-03 ENCOUNTER — Encounter: Payer: Self-pay | Admitting: Internal Medicine

## 2023-11-03 ENCOUNTER — Non-Acute Institutional Stay (SKILLED_NURSING_FACILITY): Payer: Self-pay | Admitting: Internal Medicine

## 2023-11-03 DIAGNOSIS — F01B3 Vascular dementia, moderate, with mood disturbance: Secondary | ICD-10-CM

## 2023-11-03 DIAGNOSIS — I10 Essential (primary) hypertension: Secondary | ICD-10-CM | POA: Diagnosis not present

## 2023-11-03 DIAGNOSIS — F32A Depression, unspecified: Secondary | ICD-10-CM

## 2023-11-03 DIAGNOSIS — N3281 Overactive bladder: Secondary | ICD-10-CM | POA: Diagnosis not present

## 2023-11-03 DIAGNOSIS — E039 Hypothyroidism, unspecified: Secondary | ICD-10-CM | POA: Diagnosis not present

## 2023-11-03 DIAGNOSIS — F5101 Primary insomnia: Secondary | ICD-10-CM

## 2023-11-03 DIAGNOSIS — E782 Mixed hyperlipidemia: Secondary | ICD-10-CM

## 2023-11-03 NOTE — Progress Notes (Unsigned)
Location:  [facility] Room Number: 312A Place of Service:  SNF [phone]) Provider:  Mahlon Gammon, MD   Mahlon Gammon, MD  Patient Care Team: Mahlon Gammon, MD as PCP - General (Internal Medicine)  Extended Emergency Contact Information Primary Emergency Contact: Bruhl,Wallace Address: [address]          [place], [place] [address] [place] of [place] Home Phone: [phone] Mobile Phone: [phone] Relation: Spouse Secondary Emergency Contact: Brady,Elica Address: [address]          [place], [place] Mobile Phone: [phone] Relation: Daughter  Code Status:  DNR Goals of care: Advanced Directive information    11/03/2023    4:55 PM  Advanced Directives  Does Patient Have a Medical Advance Directive? Yes  Type of Estate agent of [place];Out of facility DNR (pink MOST or yellow form)  Does patient want to make changes to medical advance directive? No - Patient declined  Copy of Healthcare Power of Attorney in Chart? Yes - validated most recent copy scanned in chart (See row information)     Chief Complaint  Patient presents with   Medical Management of Chronic Issues    Patient is being seen for a routine visit'    Immunizations    Patient is due for flu and covid     HPI:  Pt is a 88 y.o. female seen today for medical management of chronic diseases.   Lives in Memory unit in [place]   Patient has a history of dementia Recent MMSE score 19/30 in Neurology Office Recent MOCA 20/30 MRI of the brain has showed generalized atrophy ventricular megaly   She is stable. No new Nursing issues. No Behavior issues Walks with her walker Has Shuffling Gait Has failed Sinemet in the past No Falls Nurses do Notice her itching and Picking Some spots in her hands where she has tried to pick  Wt Readings from Last 3 Encounters:  11/03/23 134 lb 4.8 oz (60.9 kg)  10/06/23 136 lb  12.8 oz (62.1 kg)  09/15/23 139 lb 3.2 oz (63.1 kg)   Past Medical History:  Diagnosis Date   Anemia    Bowel obstruction (HCC)    Calculus of gallbladder with acute cholecystitis, without mention of obstruction 10/01/2013   Open chole on 10/26/13. 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    COPD (chronic obstructive pulmonary disease) (HCC)    Depression    "sold beach house 01/2012" (10/25/2013)   Gall stones    Goiter    H/O sebaceous cyst    Hepatic cyst 10/01/2013   CT   History of diverticular abscess 09/2013   Hyperlipemia    Hypertension    "was taking RX; took me off it 09/2013" (10/25/2013)   Hypothyroidism    goiter   Insomnia    MCI (mild cognitive impairment)    Osteopenia    Protein calorie malnutrition (HCC)    malabsorption   Seasonal allergies    Vitamin B12 deficiency    Past Surgical History:  Procedure Laterality Date   ABCESS DRAINAGE  2014   "had drain put in & stayed for ~ 1 wk to 10 day; just came out ~ 10 days ago" (10/25/2013)   CATARACT EXTRACTION W/ INTRAOCULAR LENS IMPLANT Left ~ 2010   CHOLECYSTECTOMY N/A 10/26/2013   Procedure: ATTEMPTED  LAPAROSCOPIC CONVERTED TO OPEN CHOLECYSTECTOMY WITH INTRAOPERATIVE CHOLANGIOGRAM; REPAIR OF CHOLECOLONIC FISTULA; cholecystoduodenal fistula  repair; lysis of adhesions; drainage of pelvic abscess; omental patching of duodenal repair;  Surgeon: Clovis Pu. Cornett, MD;  Location: [facility] OR;  Service: General;  Laterality: N/A;   COLON RESECTION  2014   DILATION AND CURETTAGE OF UTERUS     EXCISION MASS NECK Left 10/31/2016   Procedure: EXCISION CYST LEFT NECK;  Surgeon: Harriette Bouillon, MD;  Location: [facility];  Service: General;  Laterality: Left;  EXCISION CYST LEFT NECK    Allergies  Allergen Reactions   Ambien [Zolpidem Tartrate] Other (See Comments)    Makes her unsteady   Cymbalta [Duloxetine Hcl] Other (See Comments)    "kinda went  nuts"   Halcion [Triazolam] Other (See Comments)    "kinda went nuts"    Outpatient Encounter Medications as of 11/03/2023  Medication Sig   camphor-menthol (SARNA) lotion Apply 1 Application topically as needed for itching.   Cholecalciferol (VITAMIN D3) 50 MCG (2000 UT) TABS Take 1 tablet by mouth daily.   Cyanocobalamin (VITAMIN B 12 PO) Take 2,000 mcg by mouth daily.   escitalopram (LEXAPRO) 5 MG tablet Take 5 mg by mouth daily.   levothyroxine (SYNTHROID) 50 MCG tablet Take 50 mcg by mouth daily before breakfast.   losartan (COZAAR) 50 MG tablet Take 1 tablet (50 mg total) by mouth daily.   Melatonin 5 MG CAPS Take 5 mg by mouth at bedtime.   memantine (NAMENDA) 5 MG tablet Take 2 tablets (10 mg total) by mouth 2 (two) times daily.   mirabegron ER (MYRBETRIQ) 25 MG TB24 tablet Take 25 mg by mouth daily.   rosuvastatin (CRESTOR) 10 MG tablet Take 10 mg by mouth daily.   triamcinolone cream (KENALOG) 0.1 % Apply 1 Application topically 2 (two) times daily as needed.   No facility-administered encounter medications on file as of 11/03/2023.    Review of Systems  Unable to perform ROS: Dementia    Immunization History  Administered Date(s) Administered   Influenza Split 11/22/2011, 09/30/2013   Influenza, Mdck, Trivalent,PF 6+ MOS(egg free) 08/23/2016   Influenza, Quadrivalent, Recombinant, Inj, Pf 10/08/2019, 09/14/2020   Influenza,inj,Quad PF,6+ Mos 10/03/2013   Influenza-Unspecified 08/23/2016, 09/20/2022   MODERNA COVID-19 SARS-COV-2 PEDS BIVALENT BOOSTER 10yr-53yr 09/28/2021   Moderna SARS-COV2 Booster Vaccination 10/31/2020   Moderna Sars-Covid-2 Vaccination 12/28/2019, 01/26/2020, 10/21/2022   Pneumococcal Conjugate-13 01/09/2016   Pneumococcal Polysaccharide-23 08/12/2012, 10/03/2013   Td 08/16/2010   Td (Adult),5 Lf Tetanus Toxid, Preservative Free 08/16/2010   Tdap 01/16/2023   Zoster Recombinant(Shingrix) 10/15/2022, 01/16/2023   Zoster, Live 10/07/2006    Pertinent  Health Maintenance Due  Topic Date Due   INFLUENZA VACCINE  07/17/2023   DEXA SCAN  02/21/2024 (Originally 09/16/2002)      04/18/2021   11:56 AM 03/06/2023    3:17 PM 04/25/2023   10:58 AM 07/15/2023    2:29 PM 11/03/2023    4:55 PM  Fall Risk  Falls in the past year?  0 0 1 0  Was there an injury with Fall?  0 0 0 0  Fall Risk Category Calculator  0 0 1 0  (RETIRED) Patient Fall Risk Level Low fall risk      Patient at Risk for Falls Due to  Impaired balance/gait History of fall(s);Impaired balance/gait;Impaired mobility History of fall(s);Mental status change;Impaired balance/gait;Impaired mobility No Fall Risks  Fall risk Follow up  Falls evaluation completed Falls evaluation completed Falls evaluation completed;Education provided;Falls prevention discussed Falls evaluation completed   Functional Status Survey:    Vitals:  11/03/23 1653  BP: (!) 167/76  Pulse: 77  Resp: 16  Temp: 97.7 F (36.5 C)  TempSrc: Temporal  SpO2: 94%  Weight: 134 lb 4.8 oz (60.9 kg)  Height: 5\' 4"  (1.626 m)   Body mass index is 23.05 kg/m. Physical Exam Vitals reviewed.  Constitutional:      Appearance: Normal appearance.  HENT:     Head: Normocephalic.     Nose: Nose normal.     Mouth/Throat:     Mouth: Mucous membranes are moist.     Pharynx: Oropharynx is clear.  Eyes:     Pupils: Pupils are equal, round, and reactive to light.  Cardiovascular:     Rate and Rhythm: Normal rate and regular rhythm.     Pulses: Normal pulses.     Heart sounds: Normal heart sounds. No murmur heard. Pulmonary:     Effort: Pulmonary effort is normal.     Breath sounds: Normal breath sounds.  Abdominal:     General: Abdomen is flat. Bowel sounds are normal.     Palpations: Abdomen is soft.  Musculoskeletal:        General: No swelling.     Cervical back: Neck supple.  Skin:    General: Skin is warm.     Comments: Some places where she has been Picking herself  Neurological:      General: No focal deficit present.     Mental Status: She is alert.     Comments: Gait is shuffling  Psychiatric:        Mood and Affect: Mood normal.        Thought Content: Thought content normal.     Labs reviewed: Recent Labs    01/15/23 0000 06/10/23 0000 09/23/23 0000  NA 140 136* 138  K 4.2 5.5* 4.1  CL 106 100 102  CO2 23* 18 21  BUN 12 20 11   CREATININE 0.7 1.0 0.7  CALCIUM 9.0 9.1 9.5   Recent Labs    01/15/23 0000 06/10/23 0000 09/23/23 0000  AST 18 85* 23  ALT 8 48* 12  ALKPHOS 64 113 82  ALBUMIN  --  3.4* 4.0   Recent Labs    01/15/23 0000 06/10/23 0000  WBC 7.8 13.6  HGB 13.2 14.4  HCT 39 44  PLT 202 175   Lab Results  Component Value Date   TSH 4.07 11/13/2022   No results found for: "HGBA1C" Lab Results  Component Value Date   TRIG 142 11/04/2013    Significant Diagnostic Results in last 30 days:  No results found.  Assessment/Plan 1. Essential hypertension, benign BP mostly in good Levels On Losartan  2. Mild depression with ? Picking her Skin Will Change her Lexapro to 10 mg   3. Moderate vascular dementia with mood disturbance (HCC) On Namenda Doing well in SNF  4. Acquired hypothyroidism TSH normal in 8/24  5. Mixed hyperlipidemia Crestor LDL 55 in 8/24  6. Overactive bladder Myrbetriq  7. Primary insomnia Melatonin    Family/ staff Communication:   Labs/tests ordered:

## 2023-11-04 DIAGNOSIS — E039 Hypothyroidism, unspecified: Secondary | ICD-10-CM | POA: Diagnosis not present

## 2023-11-17 ENCOUNTER — Non-Acute Institutional Stay (SKILLED_NURSING_FACILITY): Payer: Self-pay | Admitting: Internal Medicine

## 2023-11-17 ENCOUNTER — Encounter: Payer: Self-pay | Admitting: Internal Medicine

## 2023-11-17 DIAGNOSIS — I10 Essential (primary) hypertension: Secondary | ICD-10-CM

## 2023-11-17 NOTE — Progress Notes (Signed)
Location:  [facility] Room Number: 131A Place of Service:  SNF [phone]) Provider:  Mahlon Gammon, MD   Mahlon Gammon, MD  Patient Care Team: Mahlon Gammon, MD as PCP - General (Internal Medicine)  Extended Emergency Contact Information Primary Emergency Contact: Brugger,Wallace Address: [address]          [place], [place] [address] [place] of [place] Home Phone: [phone] Mobile Phone: [phone] Relation: Spouse Secondary Emergency Contact: Brady,Noemie Address: [address]          [place], [place] Mobile Phone: [phone] Relation: Daughter  Code Status:  DNR Goals of care: Advanced Directive information    11/17/2023    4:33 PM  Advanced Directives  Does Patient Have a Medical Advance Directive? Yes  Type of Estate agent of [place];Out of facility DNR (pink MOST or yellow form)  Does patient want to make changes to medical advance directive? No - Patient declined  Copy of Healthcare Power of Attorney in Chart? Yes - validated most recent copy scanned in chart (See row information)     Chief Complaint  Patient presents with   Acute Visit    Patient is being seen for HTN    HPI:  Pt is a 88 y.o. female seen today for an acute visit for Elevated BP  Lives in Skilled in [place]  Patient has a history of dementia Recent MMSE score 19/30 in Neurology Office Recent MOCA 20/30 MRI of the brain has showed generalized atrophy ventricular megaly   Recent move to new room BP Elevated She is asymptomatic though does get anxious sometimes Has dementia but had no complains today Past Medical History:  Diagnosis Date   Anemia    Bowel obstruction (HCC)    Calculus of gallbladder with acute cholecystitis, without mention of obstruction 10/01/2013   Open chole on 10/26/13. 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     COPD (chronic obstructive pulmonary disease) (HCC)    Depression    "sold beach house 01/2012" (10/25/2013)   Gall stones    Goiter    H/O sebaceous cyst    Hepatic cyst 10/01/2013   CT   History of diverticular abscess 09/2013   Hyperlipemia    Hypertension    "was taking RX; took me off it 09/2013" (10/25/2013)   Hypothyroidism    goiter   Insomnia    MCI (mild cognitive impairment)    Osteopenia    Protein calorie malnutrition (HCC)    malabsorption   Seasonal allergies    Vitamin B12 deficiency    Past Surgical History:  Procedure Laterality Date   ABCESS DRAINAGE  2014   "had drain put in & stayed for ~ 1 wk to 10 day; just came out ~ 10 days ago" (10/25/2013)   CATARACT EXTRACTION W/ INTRAOCULAR LENS IMPLANT Left ~ 2010   CHOLECYSTECTOMY N/A 10/26/2013   Procedure: ATTEMPTED  LAPAROSCOPIC CONVERTED TO OPEN CHOLECYSTECTOMY WITH INTRAOPERATIVE CHOLANGIOGRAM; REPAIR OF CHOLECOLONIC FISTULA; cholecystoduodenal fistula repair; lysis of adhesions; drainage of pelvic abscess; omental patching of duodenal repair;  Surgeon: Clovis Pu. Cornett, MD;  Location: [facility] OR;  Service: General;  Laterality: N/A;   COLON RESECTION  2014   DILATION AND CURETTAGE OF UTERUS     EXCISION MASS NECK Left 10/31/2016   Procedure: EXCISION CYST LEFT NECK;  Surgeon: Harriette Bouillon, MD;  Location: [facility];  Service: General;  Laterality: Left;  EXCISION CYST LEFT NECK    Allergies  Allergen Reactions   Ambien [Zolpidem Tartrate] Other (See Comments)    Makes her unsteady   Cymbalta [Duloxetine Hcl] Other (See Comments)    "kinda went nuts"   Halcion [Triazolam] Other (See Comments)    "kinda went nuts"    Outpatient Encounter Medications as of 11/17/2023  Medication Sig   camphor-menthol (SARNA) lotion Apply 1 Application topically as needed for itching.   Cholecalciferol (VITAMIN D3) 50 MCG (2000 UT) TABS Take 1 tablet by mouth daily.   Cyanocobalamin (VITAMIN B 12 PO) Take  2,000 mcg by mouth daily.   escitalopram (LEXAPRO) 5 MG tablet Take 10 mg by mouth daily.   levothyroxine (SYNTHROID) 50 MCG tablet Take 50 mcg by mouth daily before breakfast.   losartan (COZAAR) 50 MG tablet Take 1 tablet (50 mg total) by mouth daily.   Melatonin 5 MG CAPS Take 5 mg by mouth at bedtime.   memantine (NAMENDA) 5 MG tablet Take 2 tablets (10 mg total) by mouth 2 (two) times daily.   mirabegron ER (MYRBETRIQ) 25 MG TB24 tablet Take 25 mg by mouth daily.   rosuvastatin (CRESTOR) 10 MG tablet Take 10 mg by mouth daily.   triamcinolone cream (KENALOG) 0.1 % Apply 1 Application topically 2 (two) times daily as needed.   No facility-administered encounter medications on file as of 11/17/2023.    Review of Systems  Unable to perform ROS: Dementia    Immunization History  Administered Date(s) Administered   Influenza Split 11/22/2011, 09/30/2013   Influenza, Mdck, Trivalent,PF 6+ MOS(egg free) 08/23/2016   Influenza, Quadrivalent, Recombinant, Inj, Pf 10/08/2019, 09/14/2020   Influenza,inj,Quad PF,6+ Mos 10/03/2013   Influenza-Unspecified 08/23/2016, 09/20/2022   MODERNA COVID-19 SARS-COV-2 PEDS BIVALENT BOOSTER 46yr-21yr 09/28/2021   Moderna SARS-COV2 Booster Vaccination 10/31/2020   Moderna Sars-Covid-2 Vaccination 12/28/2019, 01/26/2020, 10/21/2022   Pneumococcal Conjugate-13 01/09/2016   Pneumococcal Polysaccharide-23 08/12/2012, 10/03/2013   Td 08/16/2010   Td (Adult),5 Lf Tetanus Toxid, Preservative Free 08/16/2010   Tdap 01/16/2023   Zoster Recombinant(Shingrix) 10/15/2022, 01/16/2023   Zoster, Live 10/07/2006   Pertinent  Health Maintenance Due  Topic Date Due   INFLUENZA VACCINE  07/17/2023   DEXA SCAN  02/21/2024 (Originally 09/16/2002)      04/18/2021   11:56 AM 03/06/2023    3:17 PM 04/25/2023   10:58 AM 07/15/2023    2:29 PM 11/03/2023    4:55 PM  Fall Risk  Falls in the past year?  0 0 1 0  Was there an injury with Fall?  0 0 0 0  Fall Risk Category  Calculator  0 0 1 0  (RETIRED) Patient Fall Risk Level Low fall risk      Patient at Risk for Falls Due to  Impaired balance/gait History of fall(s);Impaired balance/gait;Impaired mobility History of fall(s);Mental status change;Impaired balance/gait;Impaired mobility No Fall Risks  Fall risk Follow up  Falls evaluation completed Falls evaluation completed Falls evaluation completed;Education provided;Falls prevention discussed Falls evaluation completed   Functional Status Survey:    Vitals:   11/17/23 1632  BP: (!) 169/71  Pulse: 71  Resp: 15  Temp: 97.7 F (36.5 C)  TempSrc: Temporal  SpO2: 93%  Weight: 134 lb 14.4 oz (61.2 kg)  Height: 5\' 4"  (1.626 m)   Body mass index is 23.16 kg/m. Physical Exam Vitals reviewed.  Constitutional:      Appearance: Normal appearance.  HENT:     Head: Normocephalic.     Nose: Nose  normal.     Mouth/Throat:     Mouth: Mucous membranes are moist.     Pharynx: Oropharynx is clear.  Eyes:     Pupils: Pupils are equal, round, and reactive to light.  Cardiovascular:     Rate and Rhythm: Normal rate and regular rhythm.     Pulses: Normal pulses.     Heart sounds: Normal heart sounds. No murmur heard. Pulmonary:     Effort: Pulmonary effort is normal.     Breath sounds: Normal breath sounds.  Abdominal:     General: Abdomen is flat. Bowel sounds are normal.     Palpations: Abdomen is soft.  Musculoskeletal:        General: No swelling.     Cervical back: Neck supple.  Skin:    General: Skin is warm.  Neurological:     General: No focal deficit present.     Mental Status: She is alert.  Psychiatric:        Mood and Affect: Mood normal.        Thought Content: Thought content normal.     Labs reviewed: Recent Labs    01/15/23 0000 06/10/23 0000 09/23/23 0000  NA 140 136* 138  K 4.2 5.5* 4.1  CL 106 100 102  CO2 23* 18 21  BUN 12 20 11   CREATININE 0.7 1.0 0.7  CALCIUM 9.0 9.1 9.5   Recent Labs    01/15/23 0000  06/10/23 0000 09/23/23 0000  AST 18 85* 23  ALT 8 48* 12  ALKPHOS 64 113 82  ALBUMIN  --  3.4* 4.0   Recent Labs    01/15/23 0000 06/10/23 0000  WBC 7.8 13.6  HGB 13.2 14.4  HCT 39 44  PLT 202 175   Lab Results  Component Value Date   TSH 4.07 11/13/2022   No results found for: "HGBA1C" Lab Results  Component Value Date   TRIG 142 11/04/2013    Significant Diagnostic Results in last 30 days:  No results found.  Assessment/Plan 1. Essential hypertension, benign Change Losartan to 50 in am and add 25 in PM Also Check BP every day for 2 weeks  Other issues  2 Mild depression with ? Picking her Skin Will Change her Lexapro to 10 mg     3. Moderate vascular dementia with mood disturbance (HCC) On Namenda Doing well in SNF   4. Acquired hypothyroidism TSH normal in 8/24   5. Mixed hyperlipidemia Crestor LDL 55 in 8/24   6. Overactive bladder Myrbetriq   7. Primary insomnia Melatonin       Family/ staff Communication:   Labs/tests ordered:

## 2023-11-18 ENCOUNTER — Telehealth: Payer: Medicare HMO

## 2023-11-18 NOTE — Telephone Encounter (Signed)
Patient's husband called and requested for patient to be seen due to itch the patient continue to have. He would like to know if it were another medication the patient could use or take to help her because of the scabs patient has.

## 2023-12-02 ENCOUNTER — Non-Acute Institutional Stay (SKILLED_NURSING_FACILITY): Payer: Self-pay | Admitting: Orthopedic Surgery

## 2023-12-02 ENCOUNTER — Encounter: Payer: Self-pay | Admitting: Orthopedic Surgery

## 2023-12-02 DIAGNOSIS — L853 Xerosis cutis: Secondary | ICD-10-CM

## 2023-12-02 DIAGNOSIS — N3281 Overactive bladder: Secondary | ICD-10-CM

## 2023-12-02 DIAGNOSIS — E782 Mixed hyperlipidemia: Secondary | ICD-10-CM | POA: Diagnosis not present

## 2023-12-02 DIAGNOSIS — F5101 Primary insomnia: Secondary | ICD-10-CM | POA: Diagnosis not present

## 2023-12-02 DIAGNOSIS — I10 Essential (primary) hypertension: Secondary | ICD-10-CM

## 2023-12-02 DIAGNOSIS — E039 Hypothyroidism, unspecified: Secondary | ICD-10-CM

## 2023-12-02 DIAGNOSIS — W19XXXA Unspecified fall, initial encounter: Secondary | ICD-10-CM | POA: Diagnosis not present

## 2023-12-02 DIAGNOSIS — F32A Depression, unspecified: Secondary | ICD-10-CM | POA: Diagnosis not present

## 2023-12-02 DIAGNOSIS — H6123 Impacted cerumen, bilateral: Secondary | ICD-10-CM | POA: Diagnosis not present

## 2023-12-02 DIAGNOSIS — F01B3 Vascular dementia, moderate, with mood disturbance: Secondary | ICD-10-CM

## 2023-12-02 MED ORDER — DEBROX 6.5 % OT SOLN: 5.0000 [drp] | Freq: Two times a day (BID) | OTIC | Status: AC

## 2023-12-02 NOTE — Progress Notes (Signed)
Location:  [facility] Room Number: 131/A Place of Service:  SNF [phone]) Provider:  Octavia Heir, NP   Mahlon Gammon, MD  Patient Care Team: Mahlon Gammon, MD as PCP - General (Internal Medicine)  Extended Emergency Contact Information Primary Emergency Contact: Fierro,Wallace Address: [address]          [place], [place] [address] [place] Home Phone: [phone] Mobile Phone: [phone] Relation: Spouse Secondary Emergency Contact: Brady,Jalaiyah Address: [address]          [place], [place] Mobile Phone: [phone] Relation: Daughter  Code Status:  DNR Goals of care: Advanced Directive information    11/17/2023    4:33 PM  Advanced Directives  Does Patient Have a Medical Advance Directive? Yes  Type of Estate agent of [place];Out of facility DNR (pink MOST or yellow form)  Does patient want to make changes to medical advance directive? No - Patient declined  Copy of Healthcare Power of Attorney in Chart? Yes - validated most recent copy scanned in chart (See row information)     Chief Complaint  Patient presents with   Medical Management of Chronic Issues    HPI:  Pt is a 88 y.o. female seen today for medical management of chronic diseases.    She currently resides on the skilled nursing unit at [facility]. PMH: HTN, diverticulitis with pericolonic abscess, gangrenous gallbladder s/p abdominal surgery 2014, goiter, hypothyroidism, OAB, dementia, insomnia, and gait abnormality.    Fall- 12/16 found on floor next to bed, no apparent injury, neuro checks normal Dementia- recent MMSE 19/30 & MOCA 20/30, 04/2021 MRI brain showed moderate to sever atrophy and moderate ventriculometry/ moderate chronic small vessel ischemic disease, itching for months per husband> seen by dermatology> no improvement, ambulates with rolator, remains on Namenda HTN- BUN/creat 11/0.7  09/23/2023, remains on losartan HLD- LDL 55 remains on rosuvastatin, ALT/AST 12/23 10/08//2024 Depression- Na+ 138 12/02/2023, no mood changes, continues to have flat affect, remains on Lexapro (started 09/2023) OAB- remains on Myrbetriq Hypothyroidism- TSH 1.43 11/04/2023, remains on levothyroxine Insomnia- remains on melatonin  Moved out of memory care weeks ago. Adjusting well per nursing.   Recent blood pressures:  12/17- 144/82  12/16- 158/82  12/15- 130/76  Recent weights:  12/01- 134.9 lbs  11/01- 134.3 lbs  10/01- 136.8 lbs     Past Medical History:  Diagnosis Date   Anemia    Bowel obstruction (HCC)    Calculus of gallbladder with acute cholecystitis, without mention of obstruction 10/01/2013   Open chole on 10/26/13. 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    COPD (chronic obstructive pulmonary disease) (HCC)    Depression    "sold beach house 01/2012" (10/25/2013)   Gall stones    Goiter    H/O sebaceous cyst    Hepatic cyst 10/01/2013   CT   History of diverticular abscess 09/2013   Hyperlipemia    Hypertension    "was taking RX; took me off it 09/2013" (10/25/2013)   Hypothyroidism    goiter   Insomnia    MCI (mild cognitive impairment)    Osteopenia    Protein calorie malnutrition (HCC)    malabsorption   Seasonal allergies    Vitamin B12 deficiency    Past Surgical History:  Procedure Laterality Date   ABCESS DRAINAGE  2014   "had drain put in & stayed for ~ 1 wk to 10 day;  just came out ~ 10 days ago" (10/25/2013)   CATARACT EXTRACTION W/ INTRAOCULAR LENS IMPLANT Left ~ 2010   CHOLECYSTECTOMY N/A 10/26/2013   Procedure: ATTEMPTED  LAPAROSCOPIC CONVERTED TO OPEN CHOLECYSTECTOMY WITH INTRAOPERATIVE CHOLANGIOGRAM; REPAIR OF CHOLECOLONIC FISTULA; cholecystoduodenal fistula repair; lysis of adhesions; drainage of pelvic abscess; omental patching of duodenal repair;  Surgeon: Clovis Pu. Cornett,  MD;  Location: [facility] OR;  Service: General;  Laterality: N/A;   COLON RESECTION  2014   DILATION AND CURETTAGE OF UTERUS     EXCISION MASS NECK Left 10/31/2016   Procedure: EXCISION CYST LEFT NECK;  Surgeon: Harriette Bouillon, MD;  Location: [facility];  Service: General;  Laterality: Left;  EXCISION CYST LEFT NECK    Allergies  Allergen Reactions   Ambien [Zolpidem Tartrate] Other (See Comments)    Makes her unsteady   Cymbalta [Duloxetine Hcl] Other (See Comments)    "kinda went nuts"   Halcion [Triazolam] Other (See Comments)    "kinda went nuts"    Outpatient Encounter Medications as of 12/02/2023  Medication Sig   camphor-menthol (SARNA) lotion Apply 1 Application topically as needed for itching.   Cholecalciferol (VITAMIN D3) 50 MCG (2000 UT) TABS Take 1 tablet by mouth daily.   Cyanocobalamin (VITAMIN B 12 PO) Take 2,000 mcg by mouth daily.   escitalopram (LEXAPRO) 5 MG tablet Take 10 mg by mouth daily.   levothyroxine (SYNTHROID) 50 MCG tablet Take 50 mcg by mouth daily before breakfast.   losartan (COZAAR) 50 MG tablet Take 1 tablet (50 mg total) by mouth daily. (Patient taking differently: Take by mouth 2 (two) times daily. 50 mg in AM and 25 mg in PM)   Melatonin 5 MG CAPS Take 5 mg by mouth at bedtime.   memantine (NAMENDA) 5 MG tablet Take 2 tablets (10 mg total) by mouth 2 (two) times daily.   mirabegron ER (MYRBETRIQ) 25 MG TB24 tablet Take 25 mg by mouth daily.   rosuvastatin (CRESTOR) 10 MG tablet Take 10 mg by mouth daily.   triamcinolone cream (KENALOG) 0.1 % Apply 1 Application topically 2 (two) times daily as needed.   No facility-administered encounter medications on file as of 12/02/2023.    Review of Systems  Unable to perform ROS: Dementia    Immunization History  Administered Date(s) Administered   Influenza Split 11/22/2011, 09/30/2013   Influenza, Mdck, Trivalent,PF 6+ MOS(egg free) 08/23/2016   Influenza, Quadrivalent, Recombinant, Inj,  Pf 10/08/2019, 09/14/2020   Influenza,inj,Quad PF,6+ Mos 10/03/2013   Influenza-Unspecified 08/23/2016, 09/20/2022   MODERNA COVID-19 SARS-COV-2 PEDS BIVALENT BOOSTER 75yr-41yr 09/28/2021   Moderna SARS-COV2 Booster Vaccination 10/31/2020   Moderna Sars-Covid-2 Vaccination 12/28/2019, 01/26/2020, 10/21/2022   Pneumococcal Conjugate-13 01/09/2016   Pneumococcal Polysaccharide-23 08/12/2012, 10/03/2013   Td 08/16/2010   Td (Adult),5 Lf Tetanus Toxid, Preservative Free 08/16/2010   Tdap 01/16/2023   Zoster Recombinant(Shingrix) 10/15/2022, 01/16/2023   Zoster, Live 10/07/2006   Pertinent  Health Maintenance Due  Topic Date Due   INFLUENZA VACCINE  07/17/2023   DEXA SCAN  02/21/2024 (Originally 09/16/2002)      04/18/2021   11:56 AM 03/06/2023    3:17 PM 04/25/2023   10:58 AM 07/15/2023    2:29 PM 11/03/2023    4:55 PM  Fall Risk  Falls in the past year?  0 0 1 0  Was there an injury with Fall?  0 0 0 0  Fall Risk Category Calculator  0 0 1 0  (RETIRED) Patient Fall Risk Level Low  fall risk      Patient at Risk for Falls Due to  Impaired balance/gait History of fall(s);Impaired balance/gait;Impaired mobility History of fall(s);Mental status change;Impaired balance/gait;Impaired mobility No Fall Risks  Fall risk Follow up  Falls evaluation completed Falls evaluation completed Falls evaluation completed;Education provided;Falls prevention discussed Falls evaluation completed   Functional Status Survey:    Vitals:   12/02/23 1449  BP: (!) 149/73  Pulse: 78  Resp: 18  Temp: (!) 97.3 F (36.3 C)  SpO2: 92%  Weight: 134 lb 14.4 oz (61.2 kg)  Height: 5\' 4"  (1.626 m)   Body mass index is 23.16 kg/m. Physical Exam Vitals reviewed.  Constitutional:      General: She is not in acute distress. HENT:     Head: Normocephalic.     Right Ear: There is impacted cerumen.     Left Ear: There is impacted cerumen.     Nose: Nose normal.     Mouth/Throat:     Mouth: Mucous membranes are  moist.  Eyes:     General:        Right eye: No discharge.        Left eye: No discharge.  Cardiovascular:     Rate and Rhythm: Normal rate and regular rhythm.     Pulses: Normal pulses.     Heart sounds: Normal heart sounds.  Pulmonary:     Effort: Pulmonary effort is normal. No respiratory distress.     Breath sounds: Normal breath sounds. No wheezing.  Abdominal:     General: Bowel sounds are normal. There is no distension.     Palpations: Abdomen is soft.     Tenderness: There is no abdominal tenderness.  Musculoskeletal:     Cervical back: Neck supple.     Right lower leg: No edema.     Left lower leg: No edema.     Comments: WBAT, able to move extremitites without difficulty  Skin:    General: Skin is warm.     Capillary Refill: Capillary refill takes less than 2 seconds.     Comments: Some areas on dry flaking skin on extremities, no rash noted  Neurological:     General: No focal deficit present.     Mental Status: She is alert. Mental status is at baseline.     Motor: Weakness present.     Gait: Gait abnormal.  Psychiatric:        Mood and Affect: Mood normal.     Comments: Mainly non verbal, follows commands, alert to self/familiar face     Labs reviewed: Recent Labs    01/15/23 0000 06/10/23 0000 09/23/23 0000  NA 140 136* 138  K 4.2 5.5* 4.1  CL 106 100 102  CO2 23* 18 21  BUN 12 20 11   CREATININE 0.7 1.0 0.7  CALCIUM 9.0 9.1 9.5   Recent Labs    01/15/23 0000 06/10/23 0000 09/23/23 0000  AST 18 85* 23  ALT 8 48* 12  ALKPHOS 64 113 82  ALBUMIN  --  3.4* 4.0   Recent Labs    01/15/23 0000 06/10/23 0000  WBC 7.8 13.6  HGB 13.2 14.4  HCT 39 44  PLT 202 175   Lab Results  Component Value Date   TSH 4.07 11/13/2022   No results found for: "HGBA1C" Lab Results  Component Value Date   TRIG 142 11/04/2013    Significant Diagnostic Results in last 30 days:  No results found.  Assessment/Plan: 1. Bilateral  hearing loss due to  cerumen impaction (Primary) - start Debrox- 5 gtts po BID x 5 days - flush ears when debrox complete  2. Fall, initial encounter - 12/16 found on floor beside bed by staff - no apparent injury - neuro checks normal  3. Moderate vascular dementia with mood disturbance (HCC) - no behaviors - moved off memory unit 2 weeks ago> adjusting well per nursing  - ongoing skin picking/ itching behaviors - ambulates with rolator - cont Namenda - will increase Lexapro to 15 mg to see if behaviors improve - bmp in 2 weeks  4. Essential hypertension, benign - controlled with losartan  5. Mixed hyperlipidemia - LDL stable - cont rosuvastatin  6. Mild depression - flat affect - no mood changes - 09/2023 Lexapro started - see above - will increase Lexapro to 15 mg  - bmp in 2 weeks  7. OAB (overactive bladder) - cont Myrbetriq  8. Acquired hypothyroidism - TSH stable - cont levothyroxine  9. Primary insomnia - cont melatonin  10. Dry skin - cont kenalog skin     Family/ staff Communication: plan discussed with patient and nurse  Labs/tests ordered:  bmp in 2 weeks

## 2023-12-03 DIAGNOSIS — L299 Pruritus, unspecified: Secondary | ICD-10-CM | POA: Diagnosis not present

## 2023-12-08 NOTE — Telephone Encounter (Signed)
I have made referral to Dermatology

## 2023-12-16 DIAGNOSIS — L2989 Other pruritus: Secondary | ICD-10-CM | POA: Diagnosis not present

## 2023-12-16 DIAGNOSIS — L308 Other specified dermatitis: Secondary | ICD-10-CM | POA: Diagnosis not present

## 2023-12-16 DIAGNOSIS — Z79899 Other long term (current) drug therapy: Secondary | ICD-10-CM | POA: Diagnosis not present

## 2023-12-16 DIAGNOSIS — L821 Other seborrheic keratosis: Secondary | ICD-10-CM | POA: Diagnosis not present

## 2023-12-19 DIAGNOSIS — Z13228 Encounter for screening for other metabolic disorders: Secondary | ICD-10-CM | POA: Diagnosis not present

## 2023-12-19 LAB — COMPREHENSIVE METABOLIC PANEL
Calcium: 9.2 (ref 8.7–10.7)
eGFR: 78

## 2023-12-19 LAB — BASIC METABOLIC PANEL
BUN: 14 (ref 4–21)
CO2: 24 — AB (ref 13–22)
Chloride: 104 (ref 99–108)
Creatinine: 0.8 (ref 0.5–1.1)
Glucose: 96
Potassium: 4.1 meq/L (ref 3.5–5.1)
Sodium: 140 (ref 137–147)

## 2023-12-22 ENCOUNTER — Non-Acute Institutional Stay (SKILLED_NURSING_FACILITY): Payer: Medicare HMO | Admitting: Adult Health

## 2023-12-22 DIAGNOSIS — L299 Pruritus, unspecified: Secondary | ICD-10-CM | POA: Diagnosis not present

## 2023-12-22 DIAGNOSIS — I10 Essential (primary) hypertension: Secondary | ICD-10-CM | POA: Diagnosis not present

## 2023-12-25 ENCOUNTER — Encounter: Payer: Self-pay | Admitting: Adult Health

## 2023-12-25 MED ORDER — LOSARTAN POTASSIUM 50 MG PO TABS: 50.0000 mg | ORAL_TABLET | Freq: Two times a day (BID) | ORAL | Status: DC

## 2023-12-25 MED ORDER — EUCERIN ADVANCED REPAIR EX CREA: 1.0000 | TOPICAL_CREAM | Freq: Every day | CUTANEOUS | Status: AC | PRN

## 2023-12-25 NOTE — Progress Notes (Signed)
 Location:  [facility] Service:  SNF (31) Provider:   Bari America, ANP [facility] [phone]   Charlanne Fredia CROME, MD  Patient Care Team: Charlanne Fredia CROME, MD as PCP - General (Internal Medicine)  Extended Emergency Contact Information Primary Emergency Contact: Versteeg,Wallace Address: [address]          [place], [place] [address] [place] Home Phone: [phone] Mobile Phone: [phone] Relation: Spouse Secondary Emergency Contact: Brady,Jasmeen Address: [address]          [place], [place] Mobile Phone: [phone] Relation: Daughter  Code Status:  DNR Goals of care: Advanced Directive information    11/17/2023    4:33 PM  Advanced Directives  Does Patient Have a Medical Advance Directive? Yes  Type of Estate Agent of [place];Out of facility DNR (pink MOST or yellow form)  Does patient want to make changes to medical advance directive? No - Patient declined  Copy of Healthcare Power of Attorney in Chart? Yes - validated most recent copy scanned in chart (See row information)     Chief Complaint  Patient presents with   Acute Visit    BP elevated, also pruritus     HPI:  Pt is a 89 y.o. female seen today for an acute visit for follow up regard rash and HTN  She was seen by dermatology for repetitive scratching and diagnosed with asteatotic dermatitis. Ordered Eucerin and triamcinolone. Labs were ordered CBC LFTs and bilirubin to look for systemic causes of her pruritus. I do not have the results. The rash is gone and our lab work her ok.  It as felt that some of her scratching was related to a behavior. Lexapro was increased to 15 mg 12/17.  The nurse feels the scratching has improved.   There is no stop date on the triamcinolone.   In addition, BP was elevated, losartan  increased 11/17/23  Bps improved but are now going back up into the 170/90s.   The manual numbers are lower.   Past Medical History:  Diagnosis Date   Anemia    Bowel obstruction (HCC)    Calculus of gallbladder with acute cholecystitis, without mention of obstruction 10/01/2013   Open chole on 10/26/13. 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    COPD (chronic obstructive pulmonary disease) (HCC)    Depression    sold beach house 01/2012 (10/25/2013)   Gall stones    Goiter    H/O sebaceous cyst    Hepatic cyst 10/01/2013   CT   History of diverticular abscess 09/2013   Hyperlipemia    Hypertension    was taking RX; took me off it 09/2013 (10/25/2013)   Hypothyroidism    goiter   Insomnia    MCI (mild cognitive impairment)    Osteopenia    Protein calorie malnutrition (HCC)    malabsorption   Seasonal allergies    Vitamin B12 deficiency    Past Surgical History:  Procedure Laterality Date   ABCESS DRAINAGE  2014   had drain put in & stayed for ~ 1 wk to 10 day; just came out ~ 10 days ago (10/25/2013)   CATARACT EXTRACTION W/ INTRAOCULAR LENS IMPLANT Left ~ 2010   CHOLECYSTECTOMY N/A 10/26/2013   Procedure: ATTEMPTED  LAPAROSCOPIC CONVERTED TO OPEN CHOLECYSTECTOMY WITH INTRAOPERATIVE CHOLANGIOGRAM; REPAIR OF CHOLECOLONIC FISTULA; cholecystoduodenal fistula repair; lysis of adhesions; drainage of pelvic abscess; omental patching  of duodenal repair;  Surgeon: Debby LABOR. Cornett, MD;  Location: [facility] OR;  Service: General;  Laterality: N/A;   COLON RESECTION  2014   DILATION AND CURETTAGE OF UTERUS     EXCISION MASS NECK Left 10/31/2016   Procedure: EXCISION CYST LEFT NECK;  Surgeon: Debby Shipper, MD;  Location: [facility];  Service: General;  Laterality: Left;  EXCISION CYST LEFT NECK    Allergies  Allergen Reactions   Ambien  [Zolpidem  Tartrate] Other (See Comments)    Makes her unsteady   Cymbalta [Duloxetine Hcl] Other (See Comments)    kinda went nuts   Halcion  [Triazolam] Other (See Comments)    kinda went nuts    Outpatient Encounter Medications as of 12/22/2023  Medication Sig   Emollient (EUCERIN ADVANCED REPAIR) CREA Apply 1 Application topically daily as needed.   Cholecalciferol (VITAMIN D3) 50 MCG (2000 UT) TABS Take 1 tablet by mouth daily.   Cyanocobalamin  (VITAMIN B 12 PO) Take 2,000 mcg by mouth daily.   escitalopram (LEXAPRO) 5 MG tablet Take 10 mg by mouth daily.   levothyroxine  (SYNTHROID ) 50 MCG tablet Take 50 mcg by mouth daily before breakfast.   losartan  (COZAAR ) 50 MG tablet Take 1 tablet (50 mg total) by mouth 2 (two) times daily. 50 mg in AM and 25 mg in PM   Melatonin 5 MG CAPS Take 5 mg by mouth at bedtime.   memantine  (NAMENDA ) 5 MG tablet Take 2 tablets (10 mg total) by mouth 2 (two) times daily.   mirabegron ER (MYRBETRIQ) 25 MG TB24 tablet Take 25 mg by mouth daily.   rosuvastatin (CRESTOR) 10 MG tablet Take 10 mg by mouth daily.   triamcinolone cream (KENALOG) 0.1 % Apply 1 Application topically 2 (two) times daily.   [DISCONTINUED] camphor-menthol (SARNA) lotion Apply 1 Application topically as needed for itching.   [DISCONTINUED] losartan  (COZAAR ) 50 MG tablet Take 1 tablet (50 mg total) by mouth daily. (Patient taking differently: Take by mouth 2 (two) times daily. 50 mg in AM and 25 mg in PM)   No facility-administered encounter medications on file as of 12/22/2023.    Review of Systems  Constitutional:  Negative for activity change, appetite change, chills, diaphoresis, fatigue, fever and unexpected weight change.  HENT:  Negative for congestion.   Respiratory:  Negative for cough, shortness of breath and wheezing.   Cardiovascular:  Negative for chest pain, palpitations and leg swelling.  Gastrointestinal:  Negative for abdominal distention, abdominal pain, constipation and diarrhea.  Genitourinary:  Negative for difficulty urinating and dysuria.  Musculoskeletal:  Positive for gait problem. Negative for  arthralgias, back pain, joint swelling and myalgias.  Neurological:  Negative for dizziness, tremors, seizures, syncope, facial asymmetry, speech difficulty, weakness, light-headedness, numbness and headaches.  Psychiatric/Behavioral:  Positive for confusion. Negative for agitation and behavioral problems.     Immunization History  Administered Date(s) Administered   Influenza Split 11/22/2011, 09/30/2013   Influenza, Mdck, Trivalent,PF 6+ MOS(egg free) 08/23/2016   Influenza, Quadrivalent, Recombinant, Inj, Pf 10/08/2019, 09/14/2020   Influenza,inj,Quad PF,6+ Mos 10/03/2013   Influenza-Unspecified 08/23/2016, 09/20/2022   MODERNA COVID-19 SARS-COV-2 PEDS BIVALENT BOOSTER 62yr-45yr 09/28/2021   Moderna SARS-COV2 Booster Vaccination 10/31/2020   Moderna Sars-Covid-2 Vaccination 12/28/2019, 01/26/2020, 10/21/2022   Pneumococcal Conjugate-13 01/09/2016   Pneumococcal Polysaccharide-23 08/12/2012, 10/03/2013   Td 08/16/2010   Td (Adult),5 Lf Tetanus Toxid, Preservative Free 08/16/2010   Tdap 01/16/2023   Zoster Recombinant(Shingrix) 10/15/2022, 01/16/2023   Zoster, Live 10/07/2006   Pertinent  Health Maintenance Due  Topic Date Due   INFLUENZA VACCINE  07/17/2023   DEXA SCAN  02/21/2024 (Originally 09/16/2002)      03/06/2023    3:17 PM 04/25/2023   10:58 AM 07/15/2023    2:29 PM 11/03/2023    4:55 PM 12/02/2023    3:22 PM  Fall Risk  Falls in the past year? 0 0 1 0 1  Was there an injury with Fall? 0 0 0 0 0  Fall Risk Category Calculator 0 0 1 0 1  Patient at Risk for Falls Due to Impaired balance/gait History of fall(s);Impaired balance/gait;Impaired mobility History of fall(s);Mental status change;Impaired balance/gait;Impaired mobility No Fall Risks History of fall(s);Impaired balance/gait;Impaired mobility  Fall risk Follow up Falls evaluation completed Falls evaluation completed Falls evaluation completed;Education provided;Falls prevention discussed Falls evaluation completed  Falls evaluation completed;Education provided   Functional Status Survey:    Vitals:   12/25/23 1447  BP: (!) 159/85  Pulse: 70  Resp: 17  Temp: (!) 97 F (36.1 C)  SpO2: 94%  Weight: 134 lb 14.4 oz (61.2 kg)   Body mass index is 23.16 kg/m. Physical Exam Vitals and nursing note reviewed.  Constitutional:      General: She is not in acute distress.    Appearance: She is not diaphoretic.  HENT:     Head: Normocephalic and atraumatic.  Neck:     Vascular: No JVD.  Cardiovascular:     Rate and Rhythm: Normal rate and regular rhythm.     Heart sounds: No murmur heard. Pulmonary:     Effort: Pulmonary effort is normal. No respiratory distress.     Breath sounds: Normal breath sounds. No wheezing.  Musculoskeletal:     Right lower leg: No edema.     Left lower leg: No edema.  Skin:    General: Skin is warm and dry.     Findings: No rash.  Neurological:     Mental Status: She is alert. Mental status is at baseline.     Labs reviewed: Recent Labs    01/15/23 0000 06/10/23 0000 09/23/23 0000  NA 140 136* 138  K 4.2 5.5* 4.1  CL 106 100 102  CO2 23* 18 21  BUN 12 20 11   CREATININE 0.7 1.0 0.7  CALCIUM 9.0 9.1 9.5   Recent Labs    01/15/23 0000 06/10/23 0000 09/23/23 0000  AST 18 85* 23  ALT 8 48* 12  ALKPHOS 64 113 82  ALBUMIN   --  3.4* 4.0   Recent Labs    01/15/23 0000 06/10/23 0000  WBC 7.8 13.6  HGB 13.2 14.4  HCT 39 44  PLT 202 175   Lab Results  Component Value Date   TSH 4.07 11/13/2022   No results found for: HGBA1C Lab Results  Component Value Date   TRIG 142 11/04/2013    Significant Diagnostic Results in last 30 days:  No results found.  Assessment/Plan  1. Pruritus (Primary) Improved Currently on triamcinolone with no stop date. Recommend they call dermatology and obtain a stop date. Continue lexapro 15 mg every day.  2. Essential hypertension, benign Manual BPs daily for 1 week and report to me Continue current  medication   Family/ staff Communication: nurse  Labs/tests ordered:  NA

## 2024-01-05 ENCOUNTER — Non-Acute Institutional Stay (SKILLED_NURSING_FACILITY): Payer: Self-pay | Admitting: Internal Medicine

## 2024-01-05 ENCOUNTER — Encounter: Payer: Self-pay | Admitting: Internal Medicine

## 2024-01-05 DIAGNOSIS — I10 Essential (primary) hypertension: Secondary | ICD-10-CM | POA: Diagnosis not present

## 2024-01-05 DIAGNOSIS — F01B3 Vascular dementia, moderate, with mood disturbance: Secondary | ICD-10-CM

## 2024-01-05 NOTE — Progress Notes (Signed)
Location:  [facility] Room Number: 131A Place of Service:  SNF [phone]) Provider:  Mahlon Gammon, MD   Mahlon Gammon, MD  Patient Care Team: Mahlon Gammon, MD as PCP - General (Internal Medicine)  Extended Emergency Contact Information Primary Emergency Contact: Dunavant,Wallace Address: [address]          [place], [place] [address] [place] Home Phone: [phone] Mobile Phone: [phone] Relation: Spouse Secondary Emergency Contact: Brady,Margarine Address: [address]          [place], [place] Mobile Phone: [phone] Relation: Daughter  Code Status:  DNR Goals of care: Advanced Directive information    01/05/2024    2:41 PM  Advanced Directives  Does Patient Have a Medical Advance Directive? Yes  Type of Estate agent of [place];Out of facility DNR (pink MOST or yellow form)  Does patient want to make changes to medical advance directive? No - Patient declined  Copy of Healthcare Power of Attorney in Chart? Yes - validated most recent copy scanned in chart (See row information)     Chief Complaint  Patient presents with   Acute Visit    Patient is being seen for elevated BP   Immunizations    Patient is due for covid and flu vaccine    Quality Metric Gaps    Patient is due for AWV after 03/06/2024    HPI:  Pt is a 89 y.o. female seen today for an acute visit for Elevated BP  Lives in Skilled in [place]   Patient has a history of dementia Recent MMSE score 19/30 in Neurology Office Recent MOCA 20/30 MRI of the brain has showed generalized atrophy ventricular megaly  BP running high Some readings of SBP  more then 160 Patient asymptomatic No Other issues today  Past Medical History:  Diagnosis Date   Anemia    Bowel obstruction (HCC)    Calculus of gallbladder with acute cholecystitis, without mention of obstruction 10/01/2013   Open chole on 10/26/13.  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    COPD (chronic obstructive pulmonary disease) (HCC)    Depression    "sold beach house 01/2012" (10/25/2013)   Gall stones    Goiter    H/O sebaceous cyst    Hepatic cyst 10/01/2013   CT   History of diverticular abscess 09/2013   Hyperlipemia    Hypertension    "was taking RX; took me off it 09/2013" (10/25/2013)   Hypothyroidism    goiter   Insomnia    MCI (mild cognitive impairment)    Osteopenia    Protein calorie malnutrition (HCC)    malabsorption   Seasonal allergies    Vitamin B12 deficiency    Past Surgical History:  Procedure Laterality Date   ABCESS DRAINAGE  2014   "had drain put in & stayed for ~ 1 wk to 10 day; just came out ~ 10 days ago" (10/25/2013)   CATARACT EXTRACTION W/ INTRAOCULAR LENS IMPLANT Left ~ 2010   CHOLECYSTECTOMY N/A 10/26/2013   Procedure: ATTEMPTED  LAPAROSCOPIC CONVERTED TO OPEN CHOLECYSTECTOMY WITH INTRAOPERATIVE CHOLANGIOGRAM; REPAIR OF CHOLECOLONIC FISTULA; cholecystoduodenal fistula repair; lysis of adhesions; drainage of pelvic abscess; omental patching of duodenal repair;  Surgeon: Clovis Pu. Cornett, MD;  Location: [facility] OR;  Service: General;  Laterality: N/A;   COLON RESECTION  2014   DILATION AND CURETTAGE OF UTERUS     EXCISION MASS NECK  Left 10/31/2016   Procedure: EXCISION CYST LEFT NECK;  Surgeon: Harriette Bouillon, MD;  Location: [facility];  Service: General;  Laterality: Left;  EXCISION CYST LEFT NECK    Allergies  Allergen Reactions   Ambien [Zolpidem Tartrate] Other (See Comments)    Makes her unsteady   Cymbalta [Duloxetine Hcl] Other (See Comments)    "kinda went nuts"   Halcion [Triazolam] Other (See Comments)    "kinda went nuts"    Outpatient Encounter Medications as of 01/05/2024  Medication Sig   Cholecalciferol (VITAMIN D3) 50 MCG (2000 UT) TABS Take 1 tablet by mouth daily.   Cyanocobalamin (VITAMIN B  12 PO) Take 2,000 mcg by mouth daily.   Emollient (EUCERIN ADVANCED REPAIR) CREA Apply 1 Application topically daily as needed.   escitalopram (LEXAPRO) 5 MG tablet Take 15 mg by mouth daily.   hydrOXYzine (ATARAX) 25 MG tablet Take 25 mg by mouth daily.   levothyroxine (SYNTHROID) 50 MCG tablet Take 50 mcg by mouth daily before breakfast.   losartan (COZAAR) 50 MG tablet Take 1 tablet (50 mg total) by mouth 2 (two) times daily. 50 mg in AM and 25 mg in PM   Melatonin 5 MG CAPS Take 5 mg by mouth at bedtime.   memantine (NAMENDA) 5 MG tablet Take 2 tablets (10 mg total) by mouth 2 (two) times daily.   mirabegron ER (MYRBETRIQ) 25 MG TB24 tablet Take 25 mg by mouth daily.   multivitamin-lutein (OCUVITE-LUTEIN) CAPS capsule Take 1 capsule by mouth daily.   rosuvastatin (CRESTOR) 10 MG tablet Take 10 mg by mouth daily.   triamcinolone cream (KENALOG) 0.1 % Apply 1 Application topically 2 (two) times daily. (Patient not taking: Reported on 01/05/2024)   No facility-administered encounter medications on file as of 01/05/2024.    Review of Systems  Unable to perform ROS: Dementia    Immunization History  Administered Date(s) Administered   Influenza Inj Mdck Quad Pf 08/23/2016   Influenza Split 11/22/2011, 09/30/2013   Influenza, Mdck, Trivalent,PF 6+ MOS(egg free) 08/23/2016   Influenza, Quadrivalent, Recombinant, Inj, Pf 10/08/2019, 09/14/2020   Influenza,inj,Quad PF,6+ Mos 10/03/2013   Influenza-Unspecified 08/23/2016, 09/20/2022   MODERNA COVID-19 SARS-COV-2 PEDS BIVALENT BOOSTER 48yr-12yr 09/28/2021   Moderna SARS-COV2 Booster Vaccination 10/31/2020   Moderna Sars-Covid-2 Vaccination 12/28/2019, 01/26/2020, 10/21/2022   Pneumococcal Conjugate-13 01/09/2016   Pneumococcal Polysaccharide-23 08/12/2012, 10/03/2013   Td 08/16/2010   Td (Adult),5 Lf Tetanus Toxid, Preservative Free 08/16/2010   Tdap 01/16/2023   Zoster Recombinant(Shingrix) 10/15/2022, 01/16/2023   Zoster, Live 10/07/2006    Pertinent  Health Maintenance Due  Topic Date Due   INFLUENZA VACCINE  07/17/2023   DEXA SCAN  02/21/2024 (Originally 09/16/2002)      03/06/2023    3:17 PM 04/25/2023   10:58 AM 07/15/2023    2:29 PM 11/03/2023    4:55 PM 12/02/2023    3:22 PM  Fall Risk  Falls in the past year? 0 0 1 0 1  Was there an injury with Fall? 0 0 0 0 0  Fall Risk Category Calculator 0 0 1 0 1  Patient at Risk for Falls Due to Impaired balance/gait History of fall(s);Impaired balance/gait;Impaired mobility History of fall(s);Mental status change;Impaired balance/gait;Impaired mobility No Fall Risks History of fall(s);Impaired balance/gait;Impaired mobility  Fall risk Follow up Falls evaluation completed Falls evaluation completed Falls evaluation completed;Education provided;Falls prevention discussed Falls evaluation completed Falls evaluation completed;Education provided   Functional Status Survey:    Vitals:   01/05/24 1413  BP: (!) 164/82  Pulse: 70  Resp: 17  Temp: (!) 97 F (36.1 C)  TempSrc: Temporal  SpO2: 94%  Weight: 129 lb 6.4 oz (58.7 kg)  Height: 5\' 4"  (1.626 m)   Body mass index is 22.21 kg/m. Physical Exam Vitals reviewed.  Constitutional:      Appearance: Normal appearance.  HENT:     Head: Normocephalic.     Nose: Nose normal.     Mouth/Throat:     Mouth: Mucous membranes are moist.     Pharynx: Oropharynx is clear.  Eyes:     Pupils: Pupils are equal, round, and reactive to light.  Cardiovascular:     Rate and Rhythm: Normal rate and regular rhythm.     Pulses: Normal pulses.     Heart sounds: Normal heart sounds. No murmur heard. Pulmonary:     Effort: Pulmonary effort is normal.     Breath sounds: Normal breath sounds.  Abdominal:     General: Abdomen is flat. Bowel sounds are normal.     Palpations: Abdomen is soft.  Musculoskeletal:        General: No swelling.     Cervical back: Neck supple.  Skin:    General: Skin is warm.  Neurological:     General:  No focal deficit present.     Mental Status: She is alert.  Psychiatric:        Mood and Affect: Mood normal.        Thought Content: Thought content normal.     Labs reviewed: Recent Labs    01/15/23 0000 06/10/23 0000 09/23/23 0000  NA 140 136* 138  K 4.2 5.5* 4.1  CL 106 100 102  CO2 23* 18 21  BUN 12 20 11   CREATININE 0.7 1.0 0.7  CALCIUM 9.0 9.1 9.5   Recent Labs    01/15/23 0000 06/10/23 0000 09/23/23 0000  AST 18 85* 23  ALT 8 48* 12  ALKPHOS 64 113 82  ALBUMIN  --  3.4* 4.0   Recent Labs    01/15/23 0000 06/10/23 0000  WBC 7.8 13.6  HGB 13.2 14.4  HCT 39 44  PLT 202 175   Lab Results  Component Value Date   TSH 4.07 11/13/2022   No results found for: "HGBA1C" Lab Results  Component Value Date   TRIG 142 11/04/2013    Significant Diagnostic Results in last 30 days:  No results found.  Assessment/Plan 1. Essential hypertension, benign (Primary) Will start her on Norvasc 2.5 mg Qpm And Change Losartan to 50 mg BID  2. Moderate vascular dementia with mood disturbance (HCC) Continue care in SNF    Family/ staff Communication:  Labs/tests ordered:

## 2024-01-22 ENCOUNTER — Non-Acute Institutional Stay (SKILLED_NURSING_FACILITY): Payer: Medicare HMO | Admitting: Adult Health

## 2024-01-22 DIAGNOSIS — F32A Depression, unspecified: Secondary | ICD-10-CM | POA: Diagnosis not present

## 2024-01-22 DIAGNOSIS — F01B3 Vascular dementia, moderate, with mood disturbance: Secondary | ICD-10-CM | POA: Diagnosis not present

## 2024-01-22 DIAGNOSIS — I10 Essential (primary) hypertension: Secondary | ICD-10-CM | POA: Diagnosis not present

## 2024-01-22 DIAGNOSIS — L299 Pruritus, unspecified: Secondary | ICD-10-CM

## 2024-01-22 DIAGNOSIS — E039 Hypothyroidism, unspecified: Secondary | ICD-10-CM | POA: Diagnosis not present

## 2024-01-23 ENCOUNTER — Encounter: Payer: Self-pay | Admitting: Adult Health

## 2024-01-23 NOTE — Progress Notes (Signed)
 Location:  [facility] Service:  SNF (31) Provider:  Tawni Darlean CARROLYN Charlanne Fredia LITTIE, MD  Patient Care Team: Charlanne Fredia LITTIE, MD as PCP - General (Internal Medicine)  Extended Emergency Contact Information Primary Emergency Contact: Applin,Wallace Address: [address]          [place], [place] [address] [place] Home Phone: [phone] Mobile Phone: [phone] Relation: Spouse Secondary Emergency Contact: Brady,Lounell Address: [address]          [place], [place] Mobile Phone: [phone] Relation: Daughter  Code Status:  DNR Goals of care: Advanced Directive information    01/05/2024    2:41 PM  Advanced Directives  Does Patient Have a Medical Advance Directive? Yes  Type of Estate Agent of [place];Out of facility DNR (pink MOST or yellow form)  Does patient want to make changes to medical advance directive? No - Patient declined  Copy of Healthcare Power of Attorney in Chart? Yes - validated most recent copy scanned in chart (See row information)     Chief Complaint  Patient presents with   Medical Management of Chronic Issues    HPI:  Pt is a 89 y.o. female seen today for medical management of chronic diseases.    BP improved after starting norvasc and losartan  increase No edema or sob is noted.   She was started on Lexapro by Dr Charlanne on 09/15/23 due to concerns for depression per her husband. Later lexapro was increased due to repetitive scratching. She also tried triamcinolone and eucerin and it improved. More ON 2/1 her husband noticed that she was scratching her chest again and so the triamcinolone was added back. The nurse has not visually seen a rash and there are no concerns for itching from staff.   Resides in memory care due to dementia. MOCA 20/30 02/20/23, MMSE 15/30 07/29/23 Walks with a walker but spends most of the day in the [facility]. Has a shuffling  gait.  Has tried sinemet  for slow movement and bradykinesia with no benefit.  MRI 04/24/21 showed generalized atrophy, ventriculomegaly, cervical spine showed multilevel degenerative changes, Likely central nervous system degenerative disorder, with vascular component.Had a declined after intrabd infection in 2014 with prolonged TPN. COPD: hx of smoking, no current symptoms of cough or sob.   Hypothyroidism: TSH 1.43 11/04/23   Hx of goiter.   HLD: LDL 55 07/29/23  Wt Readings from Last 3 Encounters:  01/23/24 128 lb (58.1 kg)  01/05/24 129 lb 6.4 oz (58.7 kg)  12/25/23 134 lb 14.4 oz (61.2 kg)    OAB: no issues with frequency reported. Has incontinence of urine and stool  Bone density declined by POA Mammogram aged out Colonoscopy aged out Past Medical History:  Diagnosis Date   Anemia    Bowel obstruction (HCC)    Calculus of gallbladder with acute cholecystitis, without mention of obstruction 10/01/2013   Open chole on 10/26/13. 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    COPD (chronic obstructive pulmonary disease) (HCC)    Depression    sold beach house 01/2012 (10/25/2013)   Gall stones    Goiter    H/O sebaceous cyst    Hepatic cyst 10/01/2013   CT   History of diverticular abscess 09/2013   Hyperlipemia    Hypertension    was taking RX; took me off it 09/2013 (10/25/2013)   Hypothyroidism    goiter   Insomnia  MCI (mild cognitive impairment)    Osteopenia    Protein calorie malnutrition (HCC)    malabsorption   Seasonal allergies    Vitamin B12 deficiency    Past Surgical History:  Procedure Laterality Date   ABCESS DRAINAGE  2014   had drain put in & stayed for ~ 1 wk to 10 day; just came out ~ 10 days ago (10/25/2013)   CATARACT EXTRACTION W/ INTRAOCULAR LENS IMPLANT Left ~ 2010   CHOLECYSTECTOMY N/A 10/26/2013   Procedure: ATTEMPTED  LAPAROSCOPIC CONVERTED TO OPEN CHOLECYSTECTOMY WITH  INTRAOPERATIVE CHOLANGIOGRAM; REPAIR OF CHOLECOLONIC FISTULA; cholecystoduodenal fistula repair; lysis of adhesions; drainage of pelvic abscess; omental patching of duodenal repair;  Surgeon: Debby LABOR. Cornett, MD;  Location: [facility] OR;  Service: General;  Laterality: N/A;   COLON RESECTION  2014   DILATION AND CURETTAGE OF UTERUS     EXCISION MASS NECK Left 10/31/2016   Procedure: EXCISION CYST LEFT NECK;  Surgeon: Debby Shipper, MD;  Location: [facility];  Service: General;  Laterality: Left;  EXCISION CYST LEFT NECK    Allergies  Allergen Reactions   Ambien  [Zolpidem  Tartrate] Other (See Comments)    Makes her unsteady   Cymbalta [Duloxetine Hcl] Other (See Comments)    kinda went nuts   Halcion [Triazolam] Other (See Comments)    kinda went nuts    Outpatient Encounter Medications as of 01/22/2024  Medication Sig   triamcinolone cream (KENALOG) 0.1 % Apply 1 Application topically 2 (two) times daily.   amLODipine (NORVASC) 2.5 MG tablet Take 2.5 mg by mouth daily.   Cholecalciferol (VITAMIN D3) 50 MCG (2000 UT) TABS Take 1 tablet by mouth daily.   Cyanocobalamin  (VITAMIN B 12 PO) Take 2,000 mcg by mouth daily.   Emollient (EUCERIN ADVANCED REPAIR) CREA Apply 1 Application topically daily as needed.   escitalopram (LEXAPRO) 5 MG tablet Take 15 mg by mouth daily.   hydrOXYzine (ATARAX) 25 MG tablet Take 12.5 mg by mouth 2 (two) times daily as needed.   levothyroxine  (SYNTHROID ) 50 MCG tablet Take 50 mcg by mouth daily before breakfast.   losartan  (COZAAR ) 50 MG tablet Take 50 mg by mouth in the morning and at bedtime.   Melatonin 5 MG CAPS Take 5 mg by mouth at bedtime.   memantine  (NAMENDA ) 5 MG tablet Take 2 tablets (10 mg total) by mouth 2 (two) times daily.   mirabegron ER (MYRBETRIQ) 25 MG TB24 tablet Take 25 mg by mouth daily.   multivitamin-lutein (OCUVITE-LUTEIN) CAPS capsule Take 1 capsule by mouth daily.   rosuvastatin (CRESTOR) 10 MG tablet Take 10 mg by  mouth daily.   No facility-administered encounter medications on file as of 01/22/2024.    Review of Systems  Constitutional:  Negative for activity change, appetite change, chills, diaphoresis, fatigue, fever and unexpected weight change.  HENT:  Negative for congestion.   Eyes:  Negative for visual disturbance.  Respiratory:  Negative for cough, shortness of breath and wheezing.   Cardiovascular:  Negative for chest pain, palpitations and leg swelling.  Gastrointestinal:  Negative for abdominal distention, abdominal pain, constipation and diarrhea.  Genitourinary:  Negative for difficulty urinating and dysuria.  Musculoskeletal:  Positive for gait problem. Negative for arthralgias, back pain, joint swelling and myalgias.  Skin:  Negative for color change.       pruritis  Neurological:  Negative for dizziness, tremors, seizures, syncope, facial asymmetry, speech difficulty, weakness, light-headedness, numbness and headaches.  Psychiatric/Behavioral:  Positive for confusion. Negative for  agitation, behavioral problems, dysphoric mood, hallucinations and sleep disturbance.     Immunization History  Administered Date(s) Administered   Influenza Inj Mdck Quad Pf 08/23/2016   Influenza Split 11/22/2011, 09/30/2013   Influenza, Mdck, Trivalent,PF 6+ MOS(egg free) 08/23/2016   Influenza, Quadrivalent, Recombinant, Inj, Pf 10/08/2019, 09/14/2020   Influenza,inj,Quad PF,6+ Mos 10/03/2013   Influenza-Unspecified 08/23/2016, 09/20/2022   MODERNA COVID-19 SARS-COV-2 PEDS BIVALENT BOOSTER 103yr-90yr 09/28/2021   Moderna SARS-COV2 Booster Vaccination 10/31/2020   Moderna Sars-Covid-2 Vaccination 12/28/2019, 01/26/2020, 10/21/2022   Pneumococcal Conjugate-13 01/09/2016   Pneumococcal Polysaccharide-23 08/12/2012, 10/03/2013   Td 08/16/2010   Td (Adult),5 Lf Tetanus Toxid, Preservative Free 08/16/2010   Tdap 01/16/2023   Zoster Recombinant(Shingrix) 10/15/2022, 01/16/2023   Zoster, Live 10/07/2006    Pertinent  Health Maintenance Due  Topic Date Due   INFLUENZA VACCINE  07/17/2023   DEXA SCAN  02/21/2024 (Originally 09/16/2002)      03/06/2023    3:17 PM 04/25/2023   10:58 AM 07/15/2023    2:29 PM 11/03/2023    4:55 PM 12/02/2023    3:22 PM  Fall Risk  Falls in the past year? 0 0 1 0 1  Was there an injury with Fall? 0 0 0 0 0  Fall Risk Category Calculator 0 0 1 0 1  Patient at Risk for Falls Due to Impaired balance/gait History of fall(s);Impaired balance/gait;Impaired mobility History of fall(s);Mental status change;Impaired balance/gait;Impaired mobility No Fall Risks History of fall(s);Impaired balance/gait;Impaired mobility  Fall risk Follow up Falls evaluation completed Falls evaluation completed Falls evaluation completed;Education provided;Falls prevention discussed Falls evaluation completed Falls evaluation completed;Education provided   Functional Status Survey:    Vitals:   01/23/24 1200  BP: 120/62  Pulse: 81  Temp: (!) 97 F (36.1 C)  SpO2: 92%  Weight: 128 lb (58.1 kg)   Body mass index is 21.97 kg/m. Physical Exam Vitals and nursing note reviewed.  Constitutional:      General: She is not in acute distress.    Appearance: She is not diaphoretic.  HENT:     Head: Normocephalic and atraumatic.  Neck:     Vascular: No JVD.  Cardiovascular:     Rate and Rhythm: Normal rate and regular rhythm.     Heart sounds: No murmur heard. Pulmonary:     Effort: Pulmonary effort is normal. No respiratory distress.     Breath sounds: Normal breath sounds. No wheezing.  Abdominal:     General: Bowel sounds are normal. There is no distension.     Palpations: Abdomen is soft.     Tenderness: There is no abdominal tenderness.  Musculoskeletal:     Right lower leg: No edema.     Left lower leg: No edema.  Skin:    General: Skin is warm and dry.     Findings: No erythema.  Neurological:     General: No focal deficit present.     Mental Status: She is alert.  Mental status is at baseline.     Labs reviewed: Recent Labs    06/10/23 0000 09/23/23 0000 12/19/23 0000  NA 136* 138 140  K 5.5* 4.1 4.1  CL 100 102 104  CO2 18 21 24*  BUN 20 11 14   CREATININE 1.0 0.7 0.8  CALCIUM 9.1 9.5 9.2   Recent Labs    06/10/23 0000 09/23/23 0000  AST 85* 23  ALT 48* 12  ALKPHOS 113 82  ALBUMIN  3.4* 4.0   Recent Labs    06/10/23 0000  WBC 13.6  HGB 14.4  HCT 44  PLT 175   Lab Results  Component Value Date   TSH 4.07 11/13/2022   No results found for: HGBA1C Lab Results  Component Value Date   TRIG 142 11/04/2013    Significant Diagnostic Results in last 30 days:  No results found.  Assessment/Plan  1. Essential hypertension, benign Improved Continue norvasc and losartan   2. Mild depression No signs of depression ON Lexapro  3. Mixed hyperlipidemia Continue Crestor   4. Acquired hypothyroidism Continue Synthroid    5. Moderate vascular dementia with mood disturbance (HCC) Progressive decline in cognition and physical function c/w the disease. Continue supportive care in the skilled environment.  6. Pruritus No signs of rash on exam No concerns for itching from staff Continue triamcinolone.  Family/ staff Communication: resident and nurse.   Labs/tests ordered:  NA

## 2024-03-02 ENCOUNTER — Encounter: Payer: Self-pay | Admitting: Neurology

## 2024-03-02 ENCOUNTER — Ambulatory Visit (INDEPENDENT_AMBULATORY_CARE_PROVIDER_SITE_OTHER): Payer: Medicare HMO | Admitting: Neurology

## 2024-03-02 VITALS — BP 100/50 | HR 64 | Ht 64.0 in | Wt 133.0 lb

## 2024-03-02 DIAGNOSIS — F0390 Unspecified dementia without behavioral disturbance: Secondary | ICD-10-CM | POA: Diagnosis not present

## 2024-03-02 NOTE — Progress Notes (Signed)
 Chief Complaint  Patient presents with   Follow-up    Pt in 12 with husband  Pt and husband memory  a little better    ASSESSMENT AND PLAN  [name] is a 89 y.o. female  1.  Dementia  2.  Gait abnormality  3.  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. Husband thinks mobility has improved.  -Living in skilled nursing at [facility] -[facility] 11/30 today (20/30; 19/30) -On Namenda 10 mg twice daily -Previous trial of Sinemet 25/100 mg 3 times daily, failed to show significant benefit, it was discontinued -Encouraged involvement in group exercise, social activity -MRI of the brain showed significant generalized atrophy, ventriculomegaly, cervical spine showed multilevel degenerative changes, no evidence of spinal cord compression -Return here on an as-needed basis   DIAGNOSTIC DATA (LABS, IMAGING, TESTING)  MRI cervical spine without contrast on Apr 24, 2021: - At C3-4 disc bulging with mild spinal stenosis and mild biforaminal stenosis. - Degenerative spondylosis and disc bulging from C3-4, C4-5 and C5-6 levels.  MRI brain (without), 04/24/2021 -Moderate to severe atrophy and moderate ventriculomegaly on ex vacuo basis. -Moderate chronic small vessel ischemic disease. -No acute findings.  Laboratory evaluation showed normal B12, thyroid panel, vitamin D  Labs from facility 07/23/22 showed creatinine 0.78, normal ALT, AST, TSH elevated 17.80, CBC unremarkable  HISTORICAL  [name] is a 89 year old female, seen in request by her primary care physician Dr.   Evlyn [name], [name], for evaluation of sudden worsening of memory loss, gait abnormality, she is accompanied by her daughter and husband at today's visit on 02/17/2021.  I reviewed and summarized the referring  note. Patient had extensive abdominal surgery in 2014, due to chronic acute on chronic cholecystitis with gangrenous gallbladder, fistula to duodenum from gallbladder, fistula to transverse colon from gallbladder, small bowel obstruction from pelvic adhesive secondary to previous diverticulitis and abscess, she underwent laparoscopic converted to open cholecystectomy, repair of cholecystoduodenal fistula, cholecystocolonic fistula, drainage of residual pelvic abscess, omental patching of duodenum and colonic repair,  She had prolonged postoperative ileus, was kept on TPN due to low p.o. intake, also had a significant postoperative pain, required polypharmacy treatment at that time,  Patient was discharged to nursing home following hospital admission, initially had significant memory loss, poor functional status, gradually recovered over the years, but was never able to bounce back to her presurgical level, had occasional bowel and bladder incontinence,  Before recent acute worsening, she was able to play bridges, walk without assistant, carry on simple conversations, family did reported that she has a long history of excessive alcohol use, can drink up to 2 bottles of wine in 1 day  Around April 20s,  she was noted to have acute worsening, had increased bowel and bladder, profound gait abnormality, worsening memory loss, husband reported that her symptoms happened overnight, but daughter reported that she already has a slow decline baseline  She was taken to emergency room on Apr 18 2021, laboratory evaluation showed no evidence of UTI, borderline troponin, normal WBC, hemoglobin of 15.3, CMP was normal, creatinine of 0.69  Update Apr 26, 2021, She is accompanied by her husband and daughter at today's clinical visit, she was getting a trial of Sinemet 25/100 mg 3 times daily for couple days, husband reported significant improvement, but daughter reported that she sleeps so much after taking  medication,  has quit giving her medication  Patient overall has improved, she can walk better, is more cooperative, but continues to have significant memory loss, gait abnormality, bowel and bladder incontinence,  Laboratory evaluation showed normal B12 CPK, thyroid function, vitamin D  MRI of the brain reviewed with patient and her family: Significant generalized atrophy, ventriculomegaly, profound periventricular white matter disease, which likely explain her significant memory loss, gait abnormality, fluctuation of her functional status  MRI of cervical spine showed multilevel degenerative changes, no evidence of cord compression  UPDATE Jan 07 2022: She lives with her husband at [facility], husband is the main caregiver, over the past few months, she has made some progress, has better appetite, able to ambulate without assistant, continue have significant memory loss, difficult to carry on a complete sentence conversation, less urinary/bowel accident  She did spend most of the time in a sitting position, husband is very dedicated to her care  Update August 21, 2022 SS: Has lived at [facility] for 8 years, just moved to memory unit 5 weeks ago. Adjusting well. Notable more interactive than other residents. Can sing all the songs, forgets details about family members, their names. Can spell nearly everything. Does her own dressings, gets help with dressing. Walks with rolling walker. No falls. No complaints. 04/19/21 MOCA 12/30, today 19/30.  Update February 20, 2023 SS: Remains in memory unit at [facility]. Thinks memory is doing fine, husband has noted decline. MOCA 20/30 today. Once last week she asked how many children they had. Her husband visits every afternoon, they eat in main dining room. Participates in social activities. She looks beautiful today. Nov 2023 TSH 4.07. Reviewed nursing home note from PCP, appears stable.  Sleeps well, good appetite.  Update March 02, 2024 SS: [facility]  11/30 today. Hearing has declined. Is skilled nursing care. Husband thinks walking better, using walker. Itching, felt related to dehydration, got cream helped tremendously. Has urinary and bowel incontinence. Remains on Namenda 10 mg BID. Eats nightly with her husband in the dining room. Does social activity, exercise class. Needs help with ADLs. Has not fallen recently. Feels depressed at current facility. Sleeps during the day. Husband spends half the day with her, her interest in conversation is very little. Asks the same questions.   REVIEW OF SYSTEMS: Full 14 system review of systems performed and notable only for as above All other review of systems were negative.  PHYSICAL EXAM:   Vitals:   03/02/24 1414  BP: (!) 100/50  Pulse: 64  Weight: 133 lb (60.3 kg)  Height: 5\' 4"  (1.626 m)   Not recorded    Body mass index is 22.83 kg/m.  PHYSICAL EXAMNIATION:  Gen: NAD, conversant, well nourised, well groomed     NEUROLOGICAL EXAM:  MENTAL STATUS: Speech:    Speech is normal; cooperative, relies on her husband for history, she is quiet, seems uninterested      03/02/2024    2:25 PM 02/20/2023    2:04 PM 08/21/2022    3:49 PM 04/19/2021    8:00 PM  Montreal Cognitive Assessment   Visuospatial/ Executive (0/5) 2 2 4 1   Naming (0/3) 2 2 3 3   Attention: Read list of digits (0/2) 1 1 2 1   Attention: Read list of letters (0/1) 1 1 1 1   Attention: Serial 7 subtraction starting at 100 (0/3) 1 3 2 1   Language: Repeat phrase (0/2) 0 2 2 2   Language : Fluency (0/1) 1 1 1  0  Abstraction (0/2) 1 1 1 2   Delayed Recall (0/5) 0 5 0 0  Orientation (0/6) 2 2 3 1   Total 11 20 19 12   Adjusted Score (based on education)  20      CRANIAL NERVES: CN II: Visual fields are full to confrontation. Pupils are round equal and briskly reactive to light. CN III, IV, VI: extraocular movement are normal. No ptosis. CN V: Facial sensation is intact to light touch CN VII: Face is symmetric with normal  eye closure  CN VIII: Hearing is normal to causal conversation. CN IX, X: Phonation is normal. CN XI: Head turning and shoulder shrug are intact  MOTOR: Good strength all extremities, no significant bradykinesia, or rigidity noted  SENSORY: Intact to light touch  COORDINATION: There is no trunk or limb dysmetria noted.  GAIT/STANCE: She needs push-up to get up from seated position, stood fairly quickly, wide-based gait, slightly unsteady, can ambulate short distances in the room with standby assistance. ALLERGIES: Allergies  Allergen Reactions   Ambien [Zolpidem Tartrate] Other (See Comments)    Makes her unsteady   Cymbalta [Duloxetine Hcl] Other (See Comments)    "kinda went nuts"   Halcion [Triazolam] Other (See Comments)    "kinda went nuts"    HOME MEDICATIONS: Current Outpatient Medications  Medication Sig Dispense Refill   amLODipine (NORVASC) 2.5 MG tablet Take 2.5 mg by mouth daily.     Cholecalciferol (VITAMIN D3) 50 MCG (2000 UT) TABS Take 1 tablet by mouth daily.     Cyanocobalamin (VITAMIN B 12 PO) Take 2,000 mcg by mouth daily.     Emollient (EUCERIN ADVANCED REPAIR) CREA Apply 1 Application topically daily as needed.     escitalopram (LEXAPRO) 5 MG tablet Take 15 mg by mouth daily.     hydrOXYzine (ATARAX) 25 MG tablet Take 12.5 mg by mouth 2 (two) times daily as needed.     levothyroxine (SYNTHROID) 50 MCG tablet Take 50 mcg by mouth daily before breakfast.     losartan (COZAAR) 50 MG tablet Take 50 mg by mouth in the morning and at bedtime.     Melatonin 5 MG CAPS Take 5 mg by mouth at bedtime.     memantine (NAMENDA) 5 MG tablet Take 2 tablets (10 mg total) by mouth 2 (two) times daily. 60 tablet 11   mirabegron ER (MYRBETRIQ) 25 MG TB24 tablet Take 25 mg by mouth daily.     multivitamin-lutein (OCUVITE-LUTEIN) CAPS capsule Take 1 capsule by mouth daily.     rosuvastatin (CRESTOR) 10 MG tablet Take 10 mg by mouth daily.     triamcinolone cream (KENALOG) 0.1  % Apply 1 Application topically 2 (two) times daily.     No current facility-administered medications for this visit.    PAST MEDICAL HISTORY: Past Medical History:  Diagnosis Date   Anemia    Bowel obstruction (HCC)    Calculus of gallbladder with acute cholecystitis, without mention of obstruction 10/01/2013   Open chole on 10/26/13. 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    COPD (chronic obstructive pulmonary disease) (HCC)    Depression    "sold beach house 01/2012" (10/25/2013)   Gall stones    Goiter    H/O sebaceous cyst    Hepatic cyst 10/01/2013   CT   History of diverticular abscess 09/2013   Hyperlipemia    Hypertension    "was taking RX; took me off it 09/2013" (10/25/2013)   Hypothyroidism  goiter   Insomnia    MCI (mild cognitive impairment)    Osteopenia    Protein calorie malnutrition (HCC)    malabsorption   Seasonal allergies    Vitamin B12 deficiency     PAST SURGICAL HISTORY: Past Surgical History:  Procedure Laterality Date   ABCESS DRAINAGE  2014   "had drain put in & stayed for ~ 1 wk to 10 day; just came out ~ 10 days ago" (10/25/2013)   CATARACT EXTRACTION W/ INTRAOCULAR LENS IMPLANT Left ~ 2010   CHOLECYSTECTOMY N/A 10/26/2013   Procedure: ATTEMPTED  LAPAROSCOPIC CONVERTED TO OPEN CHOLECYSTECTOMY WITH INTRAOPERATIVE CHOLANGIOGRAM; REPAIR OF CHOLECOLONIC FISTULA; cholecystoduodenal fistula repair; lysis of adhesions; drainage of pelvic abscess; omental patching of duodenal repair;  Surgeon: Clovis Pu. Cornett, MD;  Location: [facility] OR;  Service: General;  Laterality: N/A;   COLON RESECTION  2014   DILATION AND CURETTAGE OF UTERUS     EXCISION MASS NECK Left 10/31/2016   Procedure: EXCISION CYST LEFT NECK;  Surgeon: Harriette Bouillon, MD;  Location: [facility];  Service: General;  Laterality: Left;  EXCISION CYST LEFT NECK    FAMILY HISTORY: Family History  Problem Relation  Age of Onset   Prostate cancer Father    Alzheimer's disease Neg Hx     SOCIAL HISTORY: Social History   Socioeconomic History   Marital status: Married    Spouse name: Koren Bound   Number of children: 3   Years of education: Not on file   Highest education level: Bachelor's degree (e.g., BA, AB, BS)  Occupational History   Not on file  Tobacco Use   Smoking status: Former    Current packs/day: 0.00    Average packs/day: 0.3 packs/day for 55.0 years (13.8 ttl pk-yrs)    Types: Cigarettes    Start date: 01/20/1966    Quit date: 01/20/2021    Years since quitting: 3.1   Smokeless tobacco: Never  Vaping Use   Vaping status: Never Used  Substance and Sexual Activity   Alcohol use: Not Currently   Drug use: No   Sexual activity: Not Currently    Birth control/protection: Post-menopausal  Other Topics Concern   Not on file  Social History Narrative   Pt resides at memory unit    Retired    Social Drivers of Corporate investment banker Strain: Not on [employer] Insecurity: Not on file  Transportation Needs: Not on file  Physical Activity: Not on file  Stress: Not on file  Social Connections: Unknown (04/29/2022)   Received from [facility]   Social Network    Social Network: Not on file  Intimate Partner Violence: Unknown (03/21/2022)   Received from [facility]   HITS    Physically Hurt: Not on file    Insult or Talk Down To: Not on file    Threaten Physical Harm: Not on file    Scream or Curse: Not on file   Otila Kluver, DNP  [facility] [address] [place], [place] [address] [phone]

## 2024-03-02 NOTE — Patient Instructions (Signed)
 Great to see you today.  We will continue current medications.  Continue exercise, activity.  Continue follow-up with primary care for management of vascular risk factors.  Return here on an as-needed basis.

## 2024-03-08 ENCOUNTER — Non-Acute Institutional Stay (SKILLED_NURSING_FACILITY): Payer: Self-pay | Admitting: Internal Medicine

## 2024-03-08 DIAGNOSIS — N3281 Overactive bladder: Secondary | ICD-10-CM | POA: Diagnosis not present

## 2024-03-08 DIAGNOSIS — E039 Hypothyroidism, unspecified: Secondary | ICD-10-CM

## 2024-03-08 DIAGNOSIS — I10 Essential (primary) hypertension: Secondary | ICD-10-CM

## 2024-03-08 DIAGNOSIS — L299 Pruritus, unspecified: Secondary | ICD-10-CM | POA: Diagnosis not present

## 2024-03-08 DIAGNOSIS — F32A Depression, unspecified: Secondary | ICD-10-CM

## 2024-03-08 DIAGNOSIS — F01B3 Vascular dementia, moderate, with mood disturbance: Secondary | ICD-10-CM

## 2024-03-08 DIAGNOSIS — E782 Mixed hyperlipidemia: Secondary | ICD-10-CM | POA: Diagnosis not present

## 2024-03-08 NOTE — Progress Notes (Unsigned)
 Location:  Medical illustrator of Service:  SNF (31)  Provider:   Code Status: DNR Goals of Care:     01/05/2024    2:41 PM  Advanced Directives  Does Patient Have a Medical Advance Directive? Yes  Type of Estate agent of [place];Out of facility DNR (pink MOST or yellow form)  Does patient want to make changes to medical advance directive? No - Patient declined  Copy of Healthcare Power of Attorney in Chart? Yes - validated most recent copy scanned in chart (See row information)     Chief Complaint  Patient presents with   Care Management    HPI: Patient is a 89 y.o. female seen today for medical management of chronic diseases.    Lives in Skilled in [place]   Patient has a history of dementia Recent MMSE score 11/30 in Neurology Office  MRI of the brain has showed generalized atrophy ventricular megaly Depression, Hypothyroidism, HLD   She is stable. No new Nursing issues. No Behavior issues Her weight is stable Can walk with her walker and Assist Usually stays in her Wheelchair Gets Itching due to Anxiety No Falls Wt Readings from Last 3 Encounters:  03/08/24 131 lb 11.5 oz (59.7 kg)  03/02/24 133 lb (60.3 kg)  01/23/24 128 lb (58.1 kg)     Past Medical History:  Diagnosis Date   Anemia    Bowel obstruction (HCC)    Calculus of gallbladder with acute cholecystitis, without mention of obstruction 10/01/2013   Open chole on 10/26/13. 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    COPD (chronic obstructive pulmonary disease) (HCC)    Depression    "sold beach house 01/2012" (10/25/2013)   Gall stones    Goiter    H/O sebaceous cyst    Hepatic cyst 10/01/2013   CT   History of diverticular abscess 09/2013   Hyperlipemia    Hypertension    "was taking RX; took me off it 09/2013" (10/25/2013)   Hypothyroidism    goiter   Insomnia    MCI (mild cognitive  impairment)    Osteopenia    Protein calorie malnutrition (HCC)    malabsorption   Seasonal allergies    Vitamin B12 deficiency     Past Surgical History:  Procedure Laterality Date   ABCESS DRAINAGE  2014   "had drain put in & stayed for ~ 1 wk to 10 day; just came out ~ 10 days ago" (10/25/2013)   CATARACT EXTRACTION W/ INTRAOCULAR LENS IMPLANT Left ~ 2010   CHOLECYSTECTOMY N/A 10/26/2013   Procedure: ATTEMPTED  LAPAROSCOPIC CONVERTED TO OPEN CHOLECYSTECTOMY WITH INTRAOPERATIVE CHOLANGIOGRAM; REPAIR OF CHOLECOLONIC FISTULA; cholecystoduodenal fistula repair; lysis of adhesions; drainage of pelvic abscess; omental patching of duodenal repair;  Surgeon: Clovis Pu. Cornett, MD;  Location: [facility] OR;  Service: General;  Laterality: N/A;   COLON RESECTION  2014   DILATION AND CURETTAGE OF UTERUS     EXCISION MASS NECK Left 10/31/2016   Procedure: EXCISION CYST LEFT NECK;  Surgeon: Harriette Bouillon, MD;  Location: [facility];  Service: General;  Laterality: Left;  EXCISION CYST LEFT NECK    Allergies  Allergen Reactions   Ambien [Zolpidem Tartrate] Other (See Comments)    Makes her unsteady   Cymbalta [Duloxetine Hcl] Other (See Comments)    "kinda went nuts"   Halcion [Triazolam] Other (See Comments)    "kinda went nuts"    Outpatient Encounter Medications  as of 03/08/2024  Medication Sig   amLODipine (NORVASC) 2.5 MG tablet Take 2.5 mg by mouth daily.   Cholecalciferol (VITAMIN D3) 50 MCG (2000 UT) TABS Take 1 tablet by mouth daily.   Cyanocobalamin (VITAMIN B 12 PO) Take 2,000 mcg by mouth daily.   Emollient (EUCERIN ADVANCED REPAIR) CREA Apply 1 Application topically daily as needed.   escitalopram (LEXAPRO) 5 MG tablet Take 15 mg by mouth daily.   hydrOXYzine (ATARAX) 25 MG tablet Take 12.5 mg by mouth 2 (two) times daily as needed.   levothyroxine (SYNTHROID) 50 MCG tablet Take 50 mcg by mouth daily before breakfast.   losartan (COZAAR) 50 MG tablet Take 50 mg by  mouth in the morning and at bedtime.   Melatonin 5 MG CAPS Take 5 mg by mouth at bedtime.   memantine (NAMENDA) 5 MG tablet Take 2 tablets (10 mg total) by mouth 2 (two) times daily.   mirabegron ER (MYRBETRIQ) 25 MG TB24 tablet Take 25 mg by mouth daily.   multivitamin-lutein (OCUVITE-LUTEIN) CAPS capsule Take 1 capsule by mouth daily.   rosuvastatin (CRESTOR) 10 MG tablet Take 10 mg by mouth daily.   triamcinolone cream (KENALOG) 0.1 % Apply 1 Application topically 2 (two) times daily.   No facility-administered encounter medications on file as of 03/08/2024.    Review of Systems:  Review of Systems  Unable to perform ROS: Dementia    Health Maintenance  Topic Date Due   DEXA SCAN  Never done   INFLUENZA VACCINE  07/17/2023   COVID-19 Vaccine (5 - 2024-25 season) 08/17/2023   Medicare Annual Wellness (AWV)  03/05/2024   DTaP/Tdap/Td (4 - Td or Tdap) 01/16/2033   Pneumonia Vaccine 3+ Years old  Completed   Zoster Vaccines- Shingrix  Completed   HPV VACCINES  Aged Out    Physical Exam: Vitals:   03/08/24 1143  BP: (!) 148/86  Pulse: 76  Resp: 18  Temp: (!) 97 F (36.1 C)  Weight: 131 lb 11.5 oz (59.7 kg)   Body mass index is 22.61 kg/m. Physical Exam Vitals reviewed.  Constitutional:      Appearance: Normal appearance.  HENT:     Head: Normocephalic.     Nose: Nose normal.     Mouth/Throat:     Mouth: Mucous membranes are moist.     Pharynx: Oropharynx is clear.  Eyes:     Pupils: Pupils are equal, round, and reactive to light.  Cardiovascular:     Rate and Rhythm: Normal rate and regular rhythm.     Pulses: Normal pulses.     Heart sounds: Normal heart sounds. No murmur heard. Pulmonary:     Effort: Pulmonary effort is normal.     Breath sounds: Normal breath sounds.  Abdominal:     General: Abdomen is flat. Bowel sounds are normal.     Palpations: Abdomen is soft.  Musculoskeletal:        General: No swelling.     Cervical back: Neck supple.  Skin:     General: Skin is warm.  Neurological:     General: No focal deficit present.     Mental Status: She is alert.  Psychiatric:        Mood and Affect: Mood normal.        Thought Content: Thought content normal.     Labs reviewed: Basic Metabolic Panel: Recent Labs    06/10/23 0000 09/23/23 0000 12/19/23 0000  NA 136* 138 140  K 5.5* 4.1  4.1  CL 100 102 104  CO2 18 21 24*  BUN 20 11 14   CREATININE 1.0 0.7 0.8  CALCIUM 9.1 9.5 9.2   Liver Function Tests: Recent Labs    06/10/23 0000 09/23/23 0000  AST 85* 23  ALT 48* 12  ALKPHOS 113 82  ALBUMIN 3.4* 4.0   No results for input(s): "LIPASE", "AMYLASE" in the last 8760 hours. No results for input(s): "AMMONIA" in the last 8760 hours. CBC: Recent Labs    06/10/23 0000  WBC 13.6  HGB 14.4  HCT 44  PLT 175   Lipid Panel: No results for input(s): "CHOL", "HDL", "LDLCALC", "TRIG", "CHOLHDL", "LDLDIRECT" in the last 8760 hours. No results found for: "HGBA1C"  Procedures since last visit: No results found.  Assessment/Plan 1. Essential hypertension, benign (Primary) On Cozaar and Amlodipine BP better Controlled now  2. Moderate vascular dementia with mood disturbance (HCC) Doing well in SNF On Namenda  3. Mild depression Lexapro  4. Acquired hypothyroidism TSH normal in 11/24  5. Pruritus Atarax PRN   6. Mixed hyperlipidemia LDL 55 in 8/24  7. OAB (overactive bladder) Myrbetriq    Labs/tests ordered:  * No order type specified * Next appt:  Visit date not found

## 2024-03-09 ENCOUNTER — Encounter: Payer: Self-pay | Admitting: Internal Medicine

## 2024-03-19 ENCOUNTER — Non-Acute Institutional Stay: Payer: Self-pay | Admitting: Adult Health

## 2024-03-19 ENCOUNTER — Encounter: Payer: Self-pay | Admitting: Adult Health

## 2024-03-19 DIAGNOSIS — Z0000 Encounter for general adult medical examination without abnormal findings: Secondary | ICD-10-CM

## 2024-03-19 NOTE — Progress Notes (Signed)
 Subjective:   [name] is a 89 y.o. female who presents for Medicare Annual (Subsequent) preventive examination at [facility]   Visit Complete: In person  Patient Medicare AWV questionnaire was completed by the patient on 03/19/24; I have confirmed that all information answered by patient is correct and no changes since this date.  Cardiac Risk Factors include: advanced age (>75men, >60 women);hypertension     Objective:    Today's Vitals   03/19/24 1033  BP: 139/77  Pulse: 91  Resp: 16  Temp: 98.5 F (36.9 C)  Weight: 131 lb 6.4 oz (59.6 kg)   Body mass index is 22.55 kg/m.     03/19/2024   10:49 AM 01/05/2024    2:41 PM 11/17/2023    4:33 PM 11/03/2023    4:55 PM 10/06/2023    9:25 AM 09/15/2023    4:57 PM 07/15/2023   11:03 AM  Advanced Directives  Does Patient Have a Medical Advance Directive? Yes Yes Yes Yes Yes Yes Yes  Type of Estate agent of [place];Living will;Out of facility DNR (pink MOST or yellow form) Healthcare Power of [place];Out of facility DNR (pink MOST or yellow form) Healthcare Power of [place];Out of facility DNR (pink MOST or yellow form) Healthcare Power of [place];Out of facility DNR (pink MOST or yellow form) Healthcare Power of [place];Out of facility DNR (pink MOST or yellow form) Healthcare Power of [place];Out of facility DNR (pink MOST or yellow form) Healthcare Power of [place];Out of facility DNR (pink MOST or yellow form)  Does patient want to make changes to medical advance directive?  No - Patient declined No - Patient declined No - Patient declined No - Patient declined No - Patient declined No - Patient declined  Copy of Healthcare Power of Attorney in Chart? Yes - validated most recent copy scanned in chart (See row information) Yes - validated most recent copy scanned in chart (See row information) Yes - validated most recent copy scanned in chart (See row information) Yes - validated  most recent copy scanned in chart (See row information) Yes - validated most recent copy scanned in chart (See row information) Yes - validated most recent copy scanned in chart (See row information) Yes - validated most recent copy scanned in chart (See row information)  Pre-existing out of facility DNR order (yellow form or pink MOST form) Yellow form placed in chart (order not valid for inpatient use)          Current Medications (verified) Outpatient Encounter Medications as of 03/19/2024  Medication Sig   amLODipine (NORVASC) 2.5 MG tablet Take 2.5 mg by mouth daily.   Cholecalciferol (VITAMIN D3) 50 MCG (2000 UT) TABS Take 1 tablet by mouth daily.   Cyanocobalamin (VITAMIN B 12 PO) Take 2,000 mcg by mouth daily.   Emollient (EUCERIN ADVANCED REPAIR) CREA Apply 1 Application topically daily as needed.   escitalopram (LEXAPRO) 5 MG tablet Take 15 mg by mouth daily.   hydrOXYzine (ATARAX) 25 MG tablet Take 12.5 mg by mouth 2 (two) times daily as needed.   levothyroxine (SYNTHROID) 50 MCG tablet Take 50 mcg by mouth daily before breakfast.   losartan (COZAAR) 50 MG tablet Take 50 mg by mouth in the morning and at bedtime.   Melatonin 5 MG CAPS Take 5 mg by mouth at bedtime.   memantine (NAMENDA) 5 MG tablet Take 2 tablets (10 mg total) by mouth 2 (two) times daily.   mirabegron ER (MYRBETRIQ) 25 MG  TB24 tablet Take 25 mg by mouth daily.   multivitamin-lutein (OCUVITE-LUTEIN) CAPS capsule Take 1 capsule by mouth daily.   rosuvastatin (CRESTOR) 10 MG tablet Take 10 mg by mouth daily.   triamcinolone cream (KENALOG) 0.1 % Apply 1 Application topically 2 (two) times daily.   No facility-administered encounter medications on file as of 03/19/2024.    Allergies (verified) Ambien [zolpidem tartrate], Cymbalta [duloxetine hcl], and Halcion [triazolam]   History: Past Medical History:  Diagnosis Date   Anemia    Bowel obstruction (HCC)    Calculus of gallbladder with acute cholecystitis,  without mention of obstruction 10/01/2013   Open chole on 10/26/13. 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    COPD (chronic obstructive pulmonary disease) (HCC)    Depression    "sold beach house 01/2012" (10/25/2013)   Gall stones    Goiter    H/O sebaceous cyst    Hepatic cyst 10/01/2013   CT   History of diverticular abscess 09/2013   Hyperlipemia    Hypertension    "was taking RX; took me off it 09/2013" (10/25/2013)   Hypothyroidism    goiter   Insomnia    MCI (mild cognitive impairment)    Osteopenia    Protein calorie malnutrition (HCC)    malabsorption   Seasonal allergies    Vitamin B12 deficiency    Past Surgical History:  Procedure Laterality Date   ABCESS DRAINAGE  2014   "had drain put in & stayed for ~ 1 wk to 10 day; just came out ~ 10 days ago" (10/25/2013)   CATARACT EXTRACTION W/ INTRAOCULAR LENS IMPLANT Left ~ 2010   CHOLECYSTECTOMY N/A 10/26/2013   Procedure: ATTEMPTED  LAPAROSCOPIC CONVERTED TO OPEN CHOLECYSTECTOMY WITH INTRAOPERATIVE CHOLANGIOGRAM; REPAIR OF CHOLECOLONIC FISTULA; cholecystoduodenal fistula repair; lysis of adhesions; drainage of pelvic abscess; omental patching of duodenal repair;  Surgeon: Clovis Pu. Cornett, MD;  Location: [facility] OR;  Service: General;  Laterality: N/A;   COLON RESECTION  2014   DILATION AND CURETTAGE OF UTERUS     EXCISION MASS NECK Left 10/31/2016   Procedure: EXCISION CYST LEFT NECK;  Surgeon: Harriette Bouillon, MD;  Location: [facility];  Service: General;  Laterality: Left;  EXCISION CYST LEFT NECK   Family History  Problem Relation Age of Onset   Prostate cancer Father    Alzheimer's disease Neg Hx    Social History   Socioeconomic History   Marital status: Married    Spouse name: Koren Bound   Number of children: 3   Years of education: Not on file   Highest education level: Bachelor's degree (e.g., BA, AB, BS)  Occupational History   Not on  file  Tobacco Use   Smoking status: Former    Current packs/day: 0.00    Average packs/day: 0.3 packs/day for 55.0 years (13.8 ttl pk-yrs)    Types: Cigarettes    Start date: 01/20/1966    Quit date: 01/20/2021    Years since quitting: 3.1   Smokeless tobacco: Never  Vaping Use   Vaping status: Never Used  Substance and Sexual Activity   Alcohol use: Not Currently   Drug use: No   Sexual activity: Not Currently    Birth control/protection: Post-menopausal  Other Topics Concern   Not on file  Social History Narrative   Pt resides at [facility] unit    Retired    Social Drivers of Corporate investment banker Strain: Not on [employer] Insecurity: Not  on file  Transportation Needs: Not on file  Physical Activity: Not on file  Stress: Not on file  Social Connections: Unknown (04/29/2022)   [facility]   Social Network    Social Network: Not on file    Tobacco Counseling Counseling given: Not Answered   Clinical Intake:  Pre-visit preparation completed: No  Pain : No/denies pain     BMI - recorded: 22.55 Nutritional Status: BMI of 19-24  Normal Nutritional Risks: None Diabetes: No  How often do you need to have someone help you when you read instructions, pamphlets, or other written materials from your doctor or pharmacy?: 5 - Always What is the last grade level you completed in school?: College  Interpreter Needed?: No  Information entered by :: Tilmon Wisehart NP   Activities of Daily Living    03/19/2024   10:50 AM  In your present state of health, do you have any difficulty performing the following activities:  Hearing? 1  Vision? 0  Difficulty concentrating or making decisions? 1  Walking or climbing stairs? 1  Dressing or bathing? 1  Doing errands, shopping? 1  Preparing Food and eating ? Y  Using the Toilet? Y  In the past six months, have you accidently leaked urine? Y  Do you have problems with loss of bowel control? N   Managing your Medications? Y  Managing your Finances? Y  Housekeeping or managing your Housekeeping? Y    Patient Care Team: Mahlon Gammon, MD as PCP - General (Internal Medicine)  Indicate any recent Medical Services you may have received from other than [facility] providers in the past year (date may be approximate).     Assessment:   This is a routine wellness examination for Ahlani.  Hearing/Vision screen No results found.   Goals Addressed             This Visit's Progress    Optimal Cognitive Function       Evidence-based guidance:  Assess cognitive function using a standardized tool every 6 months or as condition changes.  Perform medication review to identify those that may impact cognitive function such as anticholinergic, opiate, benzodiazepine, digoxin, tricyclic antidepressant, skeletal muscle relaxant or antiepileptic medication.  Consider presence of hypotension or hypoglycemia because of intensive treatment for hypertension and diabetes as contributors to cognitive decline.  Screen for depression using a standardized tool such as the Geriatric Depression Scale.  Optimize visual and auditory function; refer for eye and hearing exam and advocate for treatment that may include eyeglasses, hearing aid or cataract surgery; refer for or provide occupational therapy evaluation.  Provide or refer for individual or group cognitive stimulation therapy, reminiscence therapy, cognitive training or rehabilitation, which may provide some benefit with memory loss and cognition in early stages of dementia.  Encourage social relationships and physical activity based on ability.  Promote strategies that will preserve the individual's abilities; focus on strengths and quality of life optimization.  Prepare patient and caregiver for introduction of pharmacologic therapy that may include cholinesterase inhibitor or NMDA (N-methyl-d-aspartate) receptor antagonist.  Monitor for and address  medication side effects such as anorexia, weight loss, syncope, bradycardia, falls and fractures.   Notes:        Depression Screen    03/19/2024   10:36 AM 12/02/2023    3:22 PM 11/03/2023    4:55 PM 03/06/2023    2:40 PM  PHQ 2/9 Scores  PHQ - 2 Score 0 0 0 0  Fall Risk    03/19/2024   10:38 AM 12/02/2023    3:22 PM 11/03/2023    4:55 PM 07/15/2023    2:29 PM 04/25/2023   10:58 AM  Fall Risk   Falls in the past year? 1 1 0 1 0  Number falls in past yr: 0 0 0 0 0  Injury with Fall? 0 0 0 0 0  Risk for fall due to : History of fall(s);Impaired balance/gait History of fall(s);Impaired balance/gait;Impaired mobility No Fall Risks History of fall(s);Mental status change;Impaired balance/gait;Impaired mobility History of fall(s);Impaired balance/gait;Impaired mobility  Follow up  Falls evaluation completed;Education provided Falls evaluation completed Falls evaluation completed;Education provided;Falls prevention discussed Falls evaluation completed    MEDICARE RISK AT HOME: Medicare Risk at Home Any stairs in or around the home?: No If so, are there any without handrails?: No Home free of loose throw rugs in walkways, pet beds, electrical cords, etc?: Yes Adequate lighting in your home to reduce risk of falls?: Yes Life alert?: No Use of a cane, walker or w/c?: Yes Grab bars in the bathroom?: Yes Shower chair or bench in shower?: Yes Elevated toilet seat or a handicapped toilet?: Yes  TIMED UP AND GO:  Was the test performed?  No    Cognitive Function:    03/06/2023    2:36 PM  MMSE - Mini Mental State Exam  Orientation to time 1  Orientation to Place 5  Registration 3  Attention/ Calculation 5  Recall 0  Language- name 2 objects 2  Language- repeat 1  Language- follow 3 step command 3  Language- read & follow direction 1  Write a sentence 1  Copy design 1  Total score 23      03/02/2024    2:25 PM 02/20/2023    2:04 PM 08/21/2022    3:49 PM 04/19/2021    8:00  PM  Montreal Cognitive Assessment   Visuospatial/ Executive (0/5) 2 2 4 1   Naming (0/3) 2 2 3 3   Attention: Read list of digits (0/2) 1 1 2 1   Attention: Read list of letters (0/1) 1 1 1 1   Attention: Serial 7 subtraction starting at 100 (0/3) 1 3 2 1   Language: Repeat phrase (0/2) 0 2 2 2   Language : Fluency (0/1) 1 1 1  0  Abstraction (0/2) 1 1 1 2   Delayed Recall (0/5) 0 5 0 0  Orientation (0/6) 2 2 3 1   Total 11 20 19 12   Adjusted Score (based on education)  20        03/19/2024   10:36 AM  6CIT Screen  What Year? 4 points  What month? 3 points  What time? 0 points  Count back from 20 2 points  Months in reverse 4 points  Repeat phrase 10 points  Total Score 23 points    Immunizations Immunization History  Administered Date(s) Administered   Influenza Inj Mdck Quad Pf 08/23/2016   Influenza Split 11/22/2011, 09/30/2013   Influenza, Mdck, Trivalent,PF 6+ MOS(egg free) 08/23/2016   Influenza, Quadrivalent, Recombinant, Inj, Pf 10/08/2019, 09/14/2020   Influenza,inj,Quad PF,6+ Mos 10/03/2013   Influenza-Unspecified 08/23/2016, 09/20/2022   MODERNA COVID-19 SARS-COV-2 PEDS BIVALENT BOOSTER 49yr-58yr 09/28/2021   Moderna SARS-COV2 Booster Vaccination 10/31/2020   Moderna Sars-Covid-2 Vaccination 12/28/2019, 01/26/2020, 10/21/2022   Pneumococcal Conjugate-13 01/09/2016   Pneumococcal Polysaccharide-23 08/12/2012, 10/03/2013   Td 08/16/2010   Td (Adult),5 Lf Tetanus Toxid, Preservative Free 08/16/2010   Tdap 01/16/2023   Zoster Recombinant(Shingrix) 10/15/2022, 01/16/2023  Zoster, Live 10/07/2006    TDAP status: Up to date  Flu Vaccine status: Up to date  Pneumococcal vaccine status: Up to date  Covid-19 vaccine status: Completed vaccines  Qualifies for Shingles Vaccine? Yes   Zostavax completed Yes   Shingrix Completed?: Yes  Screening Tests Health Maintenance  Topic Date Due   COVID-19 Vaccine (5 - 2024-25 season) 08/17/2023   INFLUENZA VACCINE  07/16/2024    Medicare Annual Wellness (AWV)  03/19/2025   DTaP/Tdap/Td (4 - Td or Tdap) 01/16/2033   Pneumonia Vaccine 84+ Years old  Completed   Zoster Vaccines- Shingrix  Completed   HPV VACCINES  Aged Out   DEXA SCAN  Discontinued    Health Maintenance  Health Maintenance Due  Topic Date Due   COVID-19 Vaccine (5 - 2024-25 season) 08/17/2023    Colorectal cancer screening: No longer required.   Mammogram status: No longer required due to age.  Declined BMD last year due to advancing dementia, age etc  Lung Cancer Screening: (Low Dose CT Chest recommended if Age 59-80 years, 20 pack-year currently smoking OR have quit w/in 15years.) does not qualify.   Lung Cancer Screening Referral: NA  Additional Screening:  Hepatitis C Screening: does not qualify; Completed NA  Vision Screening: Recommended annual ophthalmology exams for early detection of glaucoma and other disorders of the eye. Is the patient up to date with their annual eye exam?  No  Who is the provider or what is the name of the office in which the patient attends annual eye exams? NA If pt is not established with a provider, would they like to be referred to a provider to establish care? No .   Dental Screening: Recommended annual dental exams for proper oral hygiene  Diabetic Foot Exam: NA  Community Resource Referral / Chronic Care Management: CRR required this visit?  No   CCM required this visit?  No     Plan:     I have personally reviewed and noted the following in the patient's chart:   Medical and social history Use of alcohol, tobacco or illicit drugs  Current medications and supplements including opioid prescriptions. Patient is not currently taking opioid prescriptions. Functional ability and status Nutritional status Physical activity Advanced directives List of other physicians Hospitalizations, surgeries, and ER visits in previous 12 months Vitals Screenings to include cognitive, depression,  and falls Referrals and appointments  In addition, I have reviewed and discussed with patient certain preventive protocols, quality metrics, and best practice recommendations. A written personalized care plan for preventive services as well as general preventive health recommendations were provided to patient.     Fletcher Anon, NP   03/19/2024   After Visit Summary: fax[facility] to [facility]  Nurse Notes: NA

## 2024-03-19 NOTE — Patient Instructions (Addendum)
 [name] , Thank you for taking time to come for your Medicare Wellness Visit. I appreciate your ongoing commitment to your health goals. Please review the following plan we discussed and let me know if I can assist you in the future.   Screening recommendations/referrals: Colonoscopy aged out Mammogram aged out Bone Density declined Recommended yearly ophthalmology/optometry visit for glaucoma screening and checkup Recommended yearly dental visit for hygiene and checkup  Vaccinations: Influenza vaccine- due annually in September/October Pneumococcal vaccine up to date Tdap vaccine up to date Shingles vaccine up to date    Advanced directives: reviewed  Conditions/risks identified: fall risk   Next appointment: 1 year   Preventive Care 28 Years and Older, Female Preventive care refers to lifestyle choices and visits with your health care provider that can promote health and wellness. What does preventive care include? A yearly physical exam. This is also called an annual well check. Dental exams once or twice a year. Routine eye exams. Ask your health care provider how often you should have your eyes checked. Personal lifestyle choices, including: Daily care of your teeth and gums. Regular physical activity. Eating a healthy diet. Avoiding tobacco and drug use. Limiting alcohol use. Practicing safe sex. Taking low-dose aspirin every day. Taking vitamin and mineral supplements as recommended by your health care provider. What happens during an annual well check? The services and screenings done by your health care provider during your annual well check will depend on your age, overall health, lifestyle risk factors, and family history of disease. Counseling  Your health care provider may ask you questions about your: Alcohol use. Tobacco use. Drug use. Emotional well-being. Home and relationship well-being. Sexual activity. Eating habits. History of falls. Memory  and ability to understand (cognition). Work and work Astronomer. Reproductive health. Screening  You may have the following tests or measurements: Height, weight, and BMI. Blood pressure. Lipid and cholesterol levels. These may be checked every 5 years, or more frequently if you are over 22 years old. Skin check. Lung cancer screening. You may have this screening every year starting at age 86 if you have a 30-pack-year history of smoking and currently smoke or have quit within the past 15 years. Fecal occult blood test (FOBT) of the stool. You may have this test every year starting at age 81. Flexible sigmoidoscopy or colonoscopy. You may have a sigmoidoscopy every 5 years or a colonoscopy every 10 years starting at age 70. Hepatitis C blood test. Hepatitis B blood test. Sexually transmitted disease (STD) testing. Diabetes screening. This is done by checking your blood sugar (glucose) after you have not eaten for a while (fasting). You may have this done every 1-3 years. Bone density scan. This is done to screen for osteoporosis. You may have this done starting at age 22. Mammogram. This may be done every 1-2 years. Talk to your health care provider about how often you should have regular mammograms. Talk with your health care provider about your test results, treatment options, and if necessary, the need for more tests. Vaccines  Your health care provider may recommend certain vaccines, such as: Influenza vaccine. This is recommended every year. Tetanus, diphtheria, and acellular pertussis (Tdap, Td) vaccine. You may need a Td booster every 10 years. Zoster vaccine. You may need this after age 16. Pneumococcal 13-valent conjugate (PCV13) vaccine. One dose is recommended after age 10. Pneumococcal polysaccharide (PPSV23) vaccine. One dose is recommended after age 40. Talk to your health care provider about which screenings  and vaccines you need and how often you need them. This  information is not intended to replace advice given to you by your health care provider. Make sure you discuss any questions you have with your health care provider. Document Released: 12/29/2015 Document Revised: 08/21/2016 Document Reviewed: 10/03/2015 Elsevier Interactive Patient Education  2017 [employer].  Fall Prevention in the Home Falls can cause injuries. They can happen to people of all ages. There are many things you can do to make your home safe and to help prevent falls. What can I do on the outside of my home? Regularly fix the edges of walkways and driveways and fix any cracks. Remove anything that might make you trip as you walk through a door, such as a raised step or threshold. Trim any bushes or trees on the path to your home. Use bright outdoor lighting. Clear any walking paths of anything that might make someone trip, such as rocks or tools. Regularly check to see if handrails are loose or broken. Make sure that both sides of any steps have handrails. Any raised decks and porches should have guardrails on the edges. Have any leaves, snow, or ice cleared regularly. Use sand or salt on walking paths during winter. Clean up any spills in your garage right away. This includes oil or grease spills. What can I do in the bathroom? Use night lights. Install grab bars by the toilet and in the tub and shower. Do not use towel bars as grab bars. Use non-skid mats or decals in the tub or shower. If you need to sit down in the shower, use a plastic, non-slip stool. Keep the floor dry. Clean up any water that spills on the floor as soon as it happens. Remove soap buildup in the tub or shower regularly. Attach bath mats securely with double-sided non-slip rug tape. Do not have throw rugs and other things on the floor that can make you trip. What can I do in the bedroom? Use night lights. Make sure that you have a light by your bed that is easy to reach. Do not use any sheets or  blankets that are too big for your bed. They should not hang down onto the floor. Have a firm chair that has side arms. You can use this for support while you get dressed. Do not have throw rugs and other things on the floor that can make you trip. What can I do in the kitchen? Clean up any spills right away. Avoid walking on wet floors. Keep items that you use a lot in easy-to-reach places. If you need to reach something above you, use a strong step stool that has a grab bar. Keep electrical cords out of the way. Do not use floor polish or wax that makes floors slippery. If you must use wax, use non-skid floor wax. Do not have throw rugs and other things on the floor that can make you trip. What can I do with my stairs? Do not leave any items on the stairs. Make sure that there are handrails on both sides of the stairs and use them. Fix handrails that are broken or loose. Make sure that handrails are as long as the stairways. Check any carpeting to make sure that it is firmly attached to the stairs. Fix any carpet that is loose or worn. Avoid having throw rugs at the top or bottom of the stairs. If you do have throw rugs, attach them to the floor with carpet tape.  Make sure that you have a light switch at the top of the stairs and the bottom of the stairs. If you do not have them, ask someone to add them for you. What else can I do to help prevent falls? Wear shoes that: Do not have high heels. Have rubber bottoms. Are comfortable and fit you well. Are closed at the toe. Do not wear sandals. If you use a stepladder: Make sure that it is fully opened. Do not climb a closed stepladder. Make sure that both sides of the stepladder are locked into place. Ask someone to hold it for you, if possible. Clearly mark and make sure that you can see: Any grab bars or handrails. First and last steps. Where the edge of each step is. Use tools that help you move around (mobility aids) if they are  needed. These include: Canes. Walkers. Scooters. Crutches. Turn on the lights when you go into a dark area. Replace any light bulbs as soon as they burn out. Set up your furniture so you have a clear path. Avoid moving your furniture around. If any of your floors are uneven, fix them. If there are any pets around you, be aware of where they are. Review your medicines with your doctor. Some medicines can make you feel dizzy. This can increase your chance of falling. Ask your doctor what other things that you can do to help prevent falls. This information is not intended to replace advice given to you by your health care provider. Make sure you discuss any questions you have with your health care provider. Document Released: 09/28/2009 Document Revised: 05/09/2016 Document Reviewed: 01/06/2015 Elsevier Interactive Patient Education  2017 [employer].

## 2024-03-30 DIAGNOSIS — M79671 Pain in right foot: Secondary | ICD-10-CM | POA: Diagnosis not present

## 2024-03-30 DIAGNOSIS — L602 Onychogryphosis: Secondary | ICD-10-CM | POA: Diagnosis not present

## 2024-03-30 DIAGNOSIS — M79672 Pain in left foot: Secondary | ICD-10-CM | POA: Diagnosis not present

## 2024-03-30 DIAGNOSIS — M2041 Other hammer toe(s) (acquired), right foot: Secondary | ICD-10-CM | POA: Diagnosis not present

## 2024-03-30 DIAGNOSIS — M2042 Other hammer toe(s) (acquired), left foot: Secondary | ICD-10-CM | POA: Diagnosis not present

## 2024-04-06 ENCOUNTER — Non-Acute Institutional Stay (SKILLED_NURSING_FACILITY): Payer: Self-pay | Admitting: Orthopedic Surgery

## 2024-04-06 ENCOUNTER — Encounter: Payer: Self-pay | Admitting: Orthopedic Surgery

## 2024-04-06 DIAGNOSIS — N3281 Overactive bladder: Secondary | ICD-10-CM | POA: Diagnosis not present

## 2024-04-06 DIAGNOSIS — F32A Depression, unspecified: Secondary | ICD-10-CM | POA: Diagnosis not present

## 2024-04-06 DIAGNOSIS — E782 Mixed hyperlipidemia: Secondary | ICD-10-CM

## 2024-04-06 DIAGNOSIS — F5101 Primary insomnia: Secondary | ICD-10-CM

## 2024-04-06 DIAGNOSIS — H6123 Impacted cerumen, bilateral: Secondary | ICD-10-CM | POA: Diagnosis not present

## 2024-04-06 DIAGNOSIS — F0390 Unspecified dementia without behavioral disturbance: Secondary | ICD-10-CM

## 2024-04-06 DIAGNOSIS — E039 Hypothyroidism, unspecified: Secondary | ICD-10-CM | POA: Diagnosis not present

## 2024-04-06 DIAGNOSIS — I10 Essential (primary) hypertension: Secondary | ICD-10-CM | POA: Diagnosis not present

## 2024-04-06 MED ORDER — DEBROX 6.5 % OT SOLN: 5.0000 [drp] | Freq: Two times a day (BID) | OTIC | Status: AC

## 2024-04-06 NOTE — Progress Notes (Signed)
 Location:  [facility] Room Number: 131/A Place of Service:  SNF [phone]) Provider:  Arnetha Bhat, NP   Marguerite Shiley, MD  Patient Care Team: Marguerite Shiley, MD as PCP - General (Internal Medicine)  Extended Emergency Contact Information Primary Emergency Contact: Stitely,Wallace Address: [address]          [place], [place] [address] [place] Home Phone: [phone] Mobile Phone: [phone] Relation: Spouse Secondary Emergency Contact: freeman,stan Mobile Phone: [phone] Relation: Son  Code Status:  DNR Goals of care: Advanced Directive information    03/19/2024   10:49 AM  Advanced Directives  Does Patient Have a Medical Advance Directive? Yes  Type of Estate agent of [place];Living will;Out of facility DNR (pink MOST or yellow form)  Copy of Healthcare Power of Attorney in Chart? Yes - validated most recent copy scanned in chart (See row information)  Pre-existing out of facility DNR order (yellow form or pink MOST form) Yellow form placed in chart (order not valid for inpatient use)     Chief Complaint  Patient presents with   Medical Management of Chronic Issues    HPI:  Pt is a 89 y.o. female seen today for medical management of chronic diseases.    She currently resides on the skilled nursing unit at [facility]. PMH: HTN, diverticulitis with pericolonic abscess, gangrenous gallbladder s/p abdominal surgery 2014, goiter, hypothyroidism, OAB, dementia, insomnia, and gait abnormality.    Dementia- categorized by neurology " CNS degenerative disorder with vascular component," recent MoCA 11/30 03/02/2024, 04/2021 MRI brain showed moderate to sever atrophy and moderate ventriculometry/ moderate chronic small vessel ischemic disease, unsuccessful trial of sinemet , remains on Namenda  HTN- BUN/creat 14/0.8 12/19/2023, remains on losartan  HLD- LDL 55 remains on rosuvastatin, ALT/AST 12/23  10/08//2024 Depression- Na+ 140 12/22/2023, no mood changes, continues to have flat affect, remains on Lexapro  OAB- remains on Myrbetriq Hypothyroidism- TSH 1.43 11/04/2023, remains on levothyroxine  Insomnia- remains on melatonin  Recent blood pressures:  04/22- 119/70, 136/76  04/21- 145/74  Recent weights:  04/01- 131.4 lbs  03/01- 131.2 lbs  02/01- 128 lbs   Past Medical History:  Diagnosis Date   Anemia    Bowel obstruction (HCC)    Calculus of gallbladder with acute cholecystitis, without mention of obstruction 10/01/2013   Open chole on 10/26/13. 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    COPD (chronic obstructive pulmonary disease) (HCC)    Depression    "sold beach house 01/2012" (10/25/2013)   Gall stones    Goiter    H/O sebaceous cyst    Hepatic cyst 10/01/2013   CT   History of diverticular abscess 09/2013   Hyperlipemia    Hypertension    "was taking RX; took me off it 09/2013" (10/25/2013)   Hypothyroidism    goiter   Insomnia    MCI (mild cognitive impairment)    Osteopenia    Protein calorie malnutrition (HCC)    malabsorption   Seasonal allergies    Vitamin B12 deficiency    Past Surgical History:  Procedure Laterality Date   ABCESS DRAINAGE  2014   "had drain put in & stayed for ~ 1 wk to 10 day; just came out ~ 10 days ago" (10/25/2013)   CATARACT EXTRACTION W/ INTRAOCULAR LENS IMPLANT Left ~ 2010   CHOLECYSTECTOMY N/A 10/26/2013   Procedure: ATTEMPTED  LAPAROSCOPIC CONVERTED TO OPEN CHOLECYSTECTOMY WITH INTRAOPERATIVE CHOLANGIOGRAM; REPAIR OF CHOLECOLONIC FISTULA; cholecystoduodenal  fistula repair; lysis of adhesions; drainage of pelvic abscess; omental patching of duodenal repair;  Surgeon: Brandy Cal. Cornett, MD;  Location: [facility] OR;  Service: General;  Laterality: N/A;   COLON RESECTION  2014   DILATION AND CURETTAGE OF UTERUS     EXCISION MASS NECK Left 10/31/2016   Procedure: EXCISION  CYST LEFT NECK;  Surgeon: Sim Dryer, MD;  Location: [facility];  Service: General;  Laterality: Left;  EXCISION CYST LEFT NECK    Allergies  Allergen Reactions   Ambien  [Zolpidem  Tartrate] Other (See Comments)    Makes her unsteady   Cymbalta [Duloxetine Hcl] Other (See Comments)    "kinda went nuts"   Halcion [Triazolam] Other (See Comments)    "kinda went nuts"    Outpatient Encounter Medications as of 04/06/2024  Medication Sig   amLODipine (NORVASC) 2.5 MG tablet Take 2.5 mg by mouth daily.   Cholecalciferol (VITAMIN D3) 50 MCG (2000 UT) TABS Take 1 tablet by mouth daily.   Cyanocobalamin  (VITAMIN B 12 PO) Take 2,000 mcg by mouth daily.   Emollient (EUCERIN ADVANCED REPAIR) CREA Apply 1 Application topically daily as needed.   escitalopram (LEXAPRO) 5 MG tablet Take 15 mg by mouth daily.   hydrOXYzine (ATARAX) 25 MG tablet Take 12.5 mg by mouth 2 (two) times daily as needed.   levothyroxine  (SYNTHROID ) 50 MCG tablet Take 50 mcg by mouth daily before breakfast.   losartan  (COZAAR ) 50 MG tablet Take 50 mg by mouth in the morning and at bedtime.   Melatonin 5 MG CAPS Take 5 mg by mouth at bedtime.   memantine  (NAMENDA ) 5 MG tablet Take 2 tablets (10 mg total) by mouth 2 (two) times daily.   mirabegron ER (MYRBETRIQ) 25 MG TB24 tablet Take 25 mg by mouth daily.   multivitamin-lutein (OCUVITE-LUTEIN) CAPS capsule Take 1 capsule by mouth daily.   rosuvastatin (CRESTOR) 10 MG tablet Take 10 mg by mouth daily.   triamcinolone cream (KENALOG) 0.1 % Apply 1 Application topically 2 (two) times daily.   No facility-administered encounter medications on file as of 04/06/2024.    Review of Systems  Unable to perform ROS: Dementia    Immunization History  Administered Date(s) Administered   Fluzone Influenza virus vaccine,trivalent (IIV3), split virus 11/22/2011   Influenza Inj Mdck Quad Pf 08/23/2016   Influenza Split 09/30/2013   Influenza, Mdck, Trivalent,PF 6+  MOS(egg free) 08/23/2016   Influenza, Quadrivalent, Recombinant, Inj, Pf 10/08/2019, 09/14/2020   Influenza,inj,Quad PF,6+ Mos 10/03/2013   Influenza-Unspecified 08/23/2016, 09/20/2022   MODERNA COVID-19 SARS-COV-2 PEDS BIVALENT BOOSTER 10yr-72yr 09/28/2021   Moderna SARS-COV2 Booster Vaccination 10/31/2020   Moderna Sars-Covid-2 Vaccination 12/28/2019, 01/26/2020, 10/21/2022   Pneumococcal Conjugate-13 01/09/2016   Pneumococcal Polysaccharide-23 08/12/2012, 10/03/2013   Td 08/16/2010   Td (Adult),5 Lf Tetanus Toxid, Preservative Free 08/16/2010   Tdap 01/16/2023   Zoster Recombinant(Shingrix) 10/15/2022, 01/16/2023   Zoster, Live 10/07/2006   Pertinent  Health Maintenance Due  Topic Date Due   INFLUENZA VACCINE  07/16/2024   DEXA SCAN  Discontinued      04/25/2023   10:58 AM 07/15/2023    2:29 PM 11/03/2023    4:55 PM 12/02/2023    3:22 PM 03/19/2024   10:38 AM  Fall Risk  Falls in the past year? 0 1 0 1 1  Was there an injury with Fall? 0 0 0 0 0  Fall Risk Category Calculator 0 1 0 1 1  Patient at Risk for Falls Due to History  of fall(s);Impaired balance/gait;Impaired mobility History of fall(s);Mental status change;Impaired balance/gait;Impaired mobility No Fall Risks History of fall(s);Impaired balance/gait;Impaired mobility History of fall(s);Impaired balance/gait  Fall risk Follow up Falls evaluation completed Falls evaluation completed;Education provided;Falls prevention discussed Falls evaluation completed Falls evaluation completed;Education provided    Functional Status Survey:    Vitals:   04/06/24 1627  BP: 136/76  Pulse: 84  Resp: 16  Temp: (!) 97.5 F (36.4 C)  SpO2: 99%  Weight: 131 lb 6.4 oz (59.6 kg)  Height: 5\' 4"  (1.626 m)   Body mass index is 22.55 kg/m. Physical Exam Vitals reviewed.  Constitutional:      General: She is not in acute distress. HENT:     Head: Normocephalic.     Right Ear: There is impacted cerumen.     Left Ear: There is  impacted cerumen.     Nose: Nose normal.     Mouth/Throat:     Mouth: Mucous membranes are moist.  Eyes:     General:        Right eye: No discharge.        Left eye: No discharge.  Cardiovascular:     Rate and Rhythm: Normal rate and regular rhythm.     Pulses: Normal pulses.     Heart sounds: Normal heart sounds.  Pulmonary:     Effort: Pulmonary effort is normal. No respiratory distress.     Breath sounds: Normal breath sounds. No wheezing or rales.  Abdominal:     General: Bowel sounds are normal. There is no distension.     Palpations: Abdomen is soft.     Tenderness: There is no abdominal tenderness.  Musculoskeletal:     Cervical back: Neck supple.     Right lower leg: No edema.     Left lower leg: No edema.  Skin:    General: Skin is warm.     Capillary Refill: Capillary refill takes less than 2 seconds.  Neurological:     General: No focal deficit present.     Mental Status: She is alert. Mental status is at baseline.     Motor: Weakness present.     Gait: Gait abnormal.  Psychiatric:        Mood and Affect: Mood normal.     Labs reviewed: Recent Labs    06/10/23 0000 09/23/23 0000 12/19/23 0000  NA 136* 138 140  K 5.5* 4.1 4.1  CL 100 102 104  CO2 18 21 24*  BUN 20 11 14   CREATININE 1.0 0.7 0.8  CALCIUM 9.1 9.5 9.2   Recent Labs    06/10/23 0000 09/23/23 0000  AST 85* 23  ALT 48* 12  ALKPHOS 113 82  ALBUMIN  3.4* 4.0   Recent Labs    06/10/23 0000  WBC 13.6  HGB 14.4  HCT 44  PLT 175   Lab Results  Component Value Date   TSH 4.07 11/13/2022   No results found for: "HGBA1C" Lab Results  Component Value Date   TRIG 142 11/04/2013    Significant Diagnostic Results in last 30 days:  No results found.  Assessment/Plan 1. Bilateral hearing loss due to cerumen impaction (Primary) - start Debrox per [facility] protocol  2. Dementia without behavioral disturbance (HCC) - followed by neurology - recent MoCA 11/30 - no behaviors -  dependent with ADLs except feeding  - cont Namenda   3. Essential hypertension, benign - controlled with low dose amlodipine  4. Mixed hyperlipidemia - cont rosuvastatin  5. Mild  depression - no mood changes - Na+ stable - cont Lexapro  6. OAB (overactive bladder) - cont Myrbetriq  7. Acquired hypothyroidism - TSH stable - cont levothyroxine   8. Primary insomnia - cont melatonin    Family/ staff Communication: plan discussed with nurse  Labs/tests ordered:  none

## 2024-04-12 ENCOUNTER — Encounter: Payer: Self-pay | Admitting: Adult Health

## 2024-04-12 ENCOUNTER — Non-Acute Institutional Stay (SKILLED_NURSING_FACILITY): Payer: Self-pay | Admitting: Adult Health

## 2024-04-12 DIAGNOSIS — H6121 Impacted cerumen, right ear: Secondary | ICD-10-CM | POA: Diagnosis not present

## 2024-04-12 NOTE — Progress Notes (Unsigned)
 Location:  [facility] Room Number: 131 A Place of Service:  SNF [phone]) Provider: Raylene Calamity, NP    Patient Care Team: Marguerite Shiley, MD as PCP - General (Internal Medicine)  Extended Emergency Contact Information Primary Emergency Contact: Pha,Wallace Address: [address]          [place], [place] [address] [place] Home Phone: [phone] Mobile Phone: [phone] Relation: Spouse Secondary Emergency Contact: freeman,stan Mobile Phone: [phone] Relation: Son  Code Status:  DNR Goals of care: Advanced Directive information    03/19/2024   10:49 AM  Advanced Directives  Does Patient Have a Medical Advance Directive? Yes  Type of Estate agent of [place];Living will;Out of facility DNR (pink MOST or yellow form)  Copy of Healthcare Power of Attorney in Chart? Yes - validated most recent copy scanned in chart (See row information)  Pre-existing out of facility DNR order (yellow form or pink MOST form) Yellow form placed in chart (order not valid for inpatient use)     Chief Complaint  Patient presents with   Acute Visit    Cerumen impaction    HPI:  Pt is a 89 y.o. female seen today for an acute visit for   The patient is an 89 year old with dementia who presents with cerumen impaction in the right ear.  She has a history of cerumen impaction, with several instances this year where Debrox and irrigation were used to flush her ears. These treatments have been successful on the left ear, but the right ear continues to have impacted cerumen.  There is no difficulty in hearing or pain, and she does not experience discomfort in the ear. An attempt to remove the cerumen from the right ear was made, but it was deep, and she was unable to sit still, preventing completion of the task.  She resides in a skilled care facility. Past Medical History:  Diagnosis Date   Anemia    Bowel  obstruction (HCC)    Calculus of gallbladder with acute cholecystitis, without mention of obstruction 10/01/2013   Open chole on 10/26/13. 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    COPD (chronic obstructive pulmonary disease) (HCC)    Depression    "sold beach house 01/2012" (10/25/2013)   Gall stones    Goiter    H/O sebaceous cyst    Hepatic cyst 10/01/2013   CT   History of diverticular abscess 09/2013   Hyperlipemia    Hypertension    "was taking RX; took me off it 09/2013" (10/25/2013)   Hypothyroidism    goiter   Insomnia    MCI (mild cognitive impairment)    Osteopenia    Protein calorie malnutrition (HCC)    malabsorption   Seasonal allergies    Vitamin B12 deficiency    Past Surgical History:  Procedure Laterality Date   ABCESS DRAINAGE  2014   "had drain put in & stayed for ~ 1 wk to 10 day; just came out ~ 10 days ago" (10/25/2013)   CATARACT EXTRACTION W/ INTRAOCULAR LENS IMPLANT Left ~ 2010   CHOLECYSTECTOMY N/A 10/26/2013   Procedure: ATTEMPTED  LAPAROSCOPIC CONVERTED TO OPEN CHOLECYSTECTOMY WITH INTRAOPERATIVE CHOLANGIOGRAM; REPAIR OF CHOLECOLONIC FISTULA; cholecystoduodenal fistula repair; lysis of adhesions; drainage of pelvic abscess; omental patching of duodenal repair;  Surgeon: Brandy Cal. Cornett, MD;  Location: [facility] OR;  Service: General;  Laterality: N/A;   COLON RESECTION  2014   DILATION  AND CURETTAGE OF UTERUS     EXCISION MASS NECK Left 10/31/2016   Procedure: EXCISION CYST LEFT NECK;  Surgeon: Sim Dryer, MD;  Location: [facility];  Service: General;  Laterality: Left;  EXCISION CYST LEFT NECK    Allergies  Allergen Reactions   Ambien  [Zolpidem  Tartrate] Other (See Comments)    Makes her unsteady   Cymbalta [Duloxetine Hcl] Other (See Comments)    "kinda went nuts"   Halcion [Triazolam] Other (See Comments)    "kinda went nuts"    Outpatient Encounter Medications as  of 04/12/2024  Medication Sig   amLODipine (NORVASC) 2.5 MG tablet Take 2.5 mg by mouth daily.   ascorbic acid (VITAMIN C) 500 MG tablet Take 500 mg by mouth daily.   Cholecalciferol (VITAMIN D3) 50 MCG (2000 UT) TABS Take by mouth daily.   Cyanocobalamin  (VITAMIN B 12 PO) Take 2,000 mcg by mouth daily.   Emollient (EUCERIN ADVANCED REPAIR) CREA Apply 1 Application topically daily as needed.   escitalopram (LEXAPRO) 5 MG tablet Take 15 mg by mouth daily.   hydrOXYzine (ATARAX) 25 MG tablet Take 12.5 mg by mouth 2 (two) times daily as needed.   levothyroxine  (SYNTHROID ) 50 MCG tablet Take 50 mcg by mouth daily before breakfast.   losartan  (COZAAR ) 50 MG tablet Take 50 mg by mouth in the morning and at bedtime.   Melatonin 5 MG CAPS Take 5 mg by mouth at bedtime.   memantine  (NAMENDA ) 5 MG tablet Take 2 tablets (10 mg total) by mouth 2 (two) times daily.   mirabegron ER (MYRBETRIQ) 25 MG TB24 tablet Take 25 mg by mouth daily.   multivitamin-lutein (OCUVITE-LUTEIN) CAPS capsule Take 1 capsule by mouth daily.   rosuvastatin (CRESTOR) 10 MG tablet Take 10 mg by mouth daily.   triamcinolone cream (KENALOG) 0.1 % Apply 1 Application topically 2 (two) times daily. (Patient not taking: Reported on 04/12/2024)   No facility-administered encounter medications on file as of 04/12/2024.    Review of Systems  Immunization History  Administered Date(s) Administered   Fluzone Influenza virus vaccine,trivalent (IIV3), split virus 11/22/2011   Influenza Inj Mdck Quad Pf 08/23/2016   Influenza Split 09/30/2013   Influenza, Mdck, Trivalent,PF 6+ MOS(egg free) 08/23/2016   Influenza, Quadrivalent, Recombinant, Inj, Pf 10/08/2019, 09/14/2020   Influenza,inj,Quad PF,6+ Mos 10/03/2013   Influenza-Unspecified 08/23/2016, 09/20/2022   MODERNA COVID-19 SARS-COV-2 PEDS BIVALENT BOOSTER 24yr-66yr 09/28/2021   Moderna SARS-COV2 Booster Vaccination 10/31/2020   Moderna Sars-Covid-2 Vaccination 12/28/2019, 01/26/2020,  10/21/2022   Pneumococcal Conjugate-13 01/09/2016   Pneumococcal Polysaccharide-23 08/12/2012, 10/03/2013   Td 08/16/2010   Td (Adult),5 Lf Tetanus Toxid, Preservative Free 08/16/2010   Tdap 01/16/2023   Zoster Recombinant(Shingrix) 10/15/2022, 01/16/2023   Zoster, Live 10/07/2006   Pertinent  Health Maintenance Due  Topic Date Due   INFLUENZA VACCINE  07/16/2024   DEXA SCAN  Discontinued      07/15/2023    2:29 PM 11/03/2023    4:55 PM 12/02/2023    3:22 PM 03/19/2024   10:38 AM 04/06/2024    7:11 PM  Fall Risk  Falls in the past year? 1 0 1 1 1   Was there an injury with Fall? 0 0 0 0 0  Fall Risk Category Calculator 1 0 1 1 1   Patient at Risk for Falls Due to History of fall(s);Mental status change;Impaired balance/gait;Impaired mobility No Fall Risks History of fall(s);Impaired balance/gait;Impaired mobility History of fall(s);Impaired balance/gait History of fall(s);Impaired balance/gait  Fall risk Follow up  Falls evaluation completed;Education provided;Falls prevention discussed Falls evaluation completed Falls evaluation completed;Education provided  Falls evaluation completed;Education provided   Functional Status Survey:    Vitals:   04/12/24 1208  BP: (!) 158/75  Pulse: 83  Resp: 16  Temp: 97.7 F (36.5 C)  SpO2: 94%  Weight: 131 lb 3.2 oz (59.5 kg)  Height: 5\' 4"  (1.626 m)   Body mass index is 22.52 kg/m. Physical Exam Constitutional:      Appearance: Normal appearance.  HENT:     Head: Atraumatic.     Right Ear: There is impacted cerumen.     Left Ear: Tympanic membrane normal.  Musculoskeletal:     Cervical back: No rigidity or tenderness.  Lymphadenopathy:     Cervical: No cervical adenopathy.  Neurological:     Mental Status: She is alert. Mental status is at baseline.  Psychiatric:        Mood and Affect: Mood normal.     Labs reviewed: Recent Labs    06/10/23 0000 09/23/23 0000 12/19/23 0000  NA 136* 138 140  K 5.5* 4.1 4.1  CL 100 102  104  CO2 18 21 24*  BUN 20 11 14   CREATININE 1.0 0.7 0.8  CALCIUM 9.1 9.5 9.2   Recent Labs    06/10/23 0000 09/23/23 0000  AST 85* 23  ALT 48* 12  ALKPHOS 113 82  ALBUMIN  3.4* 4.0   Recent Labs    06/10/23 0000  WBC 13.6  HGB 14.4  HCT 44  PLT 175   Lab Results  Component Value Date   TSH 4.07 11/13/2022   No results found for: "HGBA1C" Lab Results  Component Value Date   TRIG 142 11/04/2013    Significant Diagnostic Results in last 30 days:  No results found.  Assessment/Plan  Impacted cerumen, right ear Chronic impaction persists despite Debrox and irrigation. Manual removal unsuccessful due to deep impaction and movement. - Consider ENT referral if unresolved and causing symptoms. Otherwise would not intervene.

## 2024-04-14 ENCOUNTER — Encounter: Payer: Self-pay | Admitting: Adult Health

## 2024-04-20 DIAGNOSIS — H6123 Impacted cerumen, bilateral: Secondary | ICD-10-CM | POA: Diagnosis not present

## 2024-05-04 ENCOUNTER — Non-Acute Institutional Stay (SKILLED_NURSING_FACILITY): Payer: Self-pay | Admitting: Orthopedic Surgery

## 2024-05-04 ENCOUNTER — Encounter: Payer: Self-pay | Admitting: Orthopedic Surgery

## 2024-05-04 DIAGNOSIS — F5101 Primary insomnia: Secondary | ICD-10-CM

## 2024-05-04 DIAGNOSIS — N3281 Overactive bladder: Secondary | ICD-10-CM | POA: Diagnosis not present

## 2024-05-04 DIAGNOSIS — E782 Mixed hyperlipidemia: Secondary | ICD-10-CM

## 2024-05-04 DIAGNOSIS — F0390 Unspecified dementia without behavioral disturbance: Secondary | ICD-10-CM

## 2024-05-04 DIAGNOSIS — E039 Hypothyroidism, unspecified: Secondary | ICD-10-CM

## 2024-05-04 DIAGNOSIS — H6122 Impacted cerumen, left ear: Secondary | ICD-10-CM | POA: Diagnosis not present

## 2024-05-04 DIAGNOSIS — F32A Depression, unspecified: Secondary | ICD-10-CM

## 2024-05-04 DIAGNOSIS — I10 Essential (primary) hypertension: Secondary | ICD-10-CM | POA: Diagnosis not present

## 2024-05-04 NOTE — Progress Notes (Signed)
 [facility] Room Number: 131-A Place of Service:  SNF (31) Provider:  Ulyses Gandy, NP  [id number], Dawn Brome, MD  Patient Care Team: Dawn Shiley, MD as PCP - General (Internal Medicine)  Extended Emergency Contact Information Primary Emergency Contact: [name],Dawn Address: [address]          [place], [place] [address] [place] Home Phone: [phone] Mobile Phone: [phone] Relation: Spouse Secondary Emergency Contact: [name],Dawn Mobile Phone: [phone] Relation: Son  Code Status:  DNR Goals of care: Advanced Directive information    05/04/2024   10:27 AM  Advanced Directives  Does Patient Have a Medical Advance Directive? Yes  Type of Estate agent of [place];Out of facility DNR (pink MOST or yellow form)  Does patient want to make changes to medical advance directive? No - Patient declined  Copy of Healthcare Power of Attorney in Chart? Yes - validated most recent copy scanned in chart (See row information)     Chief Complaint  Patient presents with   Medical Management of Chronic Issues    Routine Visit. Discuss the need for Covid Booster.    HPI:  Pt is a 89 y.o. female seen today for medical management of chronic diseases.    She currently resides on the skilled nursing unit at [facility]. PMH: HTN, diverticulitis with pericolonic abscess, gangrenous gallbladder s/p abdominal surgery 2014, goiter, hypothyroidism, OAB, dementia, insomnia, and gait abnormality.   Left ear cerumen impaction- completed 2 rounds Debrox this month, f/u with ENT 05/20> ear flushed Dementia- categorized by neurology " CNS degenerative disorder with vascular component," recent MoCA 11/30 03/02/2024, 04/2021 MRI brain showed moderate to sever atrophy and moderate ventriculometry/ moderate chronic small vessel ischemic disease, unsuccessful trial of sinemet , remains on Namenda  HTN-  BUN/creat 14/0.8 12/19/2023, remains on losartan  HLD- LDL 55, remains on rosuvastatin, ALT/AST 12/23 10/08//2024 Depression- Na+ 140 12/22/2023, no mood changes, continues to have flat affect, remains on Lexapro  OAB- remains on Myrbetriq Hypothyroidism- TSH 1.43 11/04/2023, remains on levothyroxine  Insomnia- remains on melatonin  Recent blood pressures:  05/19- 144/82  05/18- 133/78, 125/79  05/17- 151/75  Recent weights:  05/01- 136.3 lbs  04/01- 131.4 lbs  03/01- 131.2 lbs    Past Medical History:  Diagnosis Date   Anemia    Bowel obstruction (HCC)    Calculus of gallbladder with acute cholecystitis, without mention of obstruction 10/01/2013   Open chole on 10/26/13. 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    COPD (chronic obstructive pulmonary disease) (HCC)    Depression    "sold beach house 01/2012" (10/25/2013)   Gall stones    Goiter    H/O sebaceous cyst    Hepatic cyst 10/01/2013   CT   History of diverticular abscess 09/2013   Hyperlipemia    Hypertension    "was taking RX; took me off it 09/2013" (10/25/2013)   Hypothyroidism    goiter   Insomnia    MCI (mild cognitive impairment)    Osteopenia    Protein calorie malnutrition (HCC)    malabsorption   Seasonal allergies    Vitamin B12 deficiency    Past Surgical History:  Procedure Laterality Date   ABCESS DRAINAGE  2014   "had drain put in & stayed for ~ 1 wk to 10 day; just came out ~ 10 days ago" (10/25/2013)   CATARACT EXTRACTION W/ INTRAOCULAR LENS IMPLANT Left ~ 2010   CHOLECYSTECTOMY  N/A 10/26/2013   Procedure: ATTEMPTED  LAPAROSCOPIC CONVERTED TO OPEN CHOLECYSTECTOMY WITH INTRAOPERATIVE CHOLANGIOGRAM; REPAIR OF CHOLECOLONIC FISTULA; cholecystoduodenal fistula repair; lysis of adhesions; drainage of pelvic abscess; omental patching of duodenal repair;  Surgeon: Dawn Cal. Cornett, MD;  Location: [facility] OR;  Service: General;  Laterality: N/A;    COLON RESECTION  2014   DILATION AND CURETTAGE OF UTERUS     EXCISION MASS NECK Left 10/31/2016   Procedure: EXCISION CYST LEFT NECK;  Surgeon: Dawn Dryer, MD;  Location: [facility];  Service: General;  Laterality: Left;  EXCISION CYST LEFT NECK    Allergies  Allergen Reactions   Ambien  [Zolpidem  Tartrate] Other (See Comments)    Makes her unsteady   Cymbalta [Duloxetine Hcl] Other (See Comments)    "kinda went nuts"   Halcion [Triazolam] Other (See Comments)    "kinda went nuts"    Allergies as of 05/04/2024       Reactions   Ambien  [zolpidem  Tartrate] Other (See Comments)   Makes her unsteady   Cymbalta [duloxetine Hcl] Other (See Comments)   "kinda went nuts"   Halcion [triazolam] Other (See Comments)   "kinda went nuts"        Medication List        Accurate as of May 04, 2024 10:27 AM. If you have any questions, ask your nurse or doctor.          amLODipine 2.5 MG tablet Commonly known as: NORVASC Take 2.5 mg by mouth daily.   ascorbic acid 500 MG tablet Commonly known as: VITAMIN C Take 500 mg by mouth daily.   escitalopram 5 MG tablet Commonly known as: LEXAPRO Take 15 mg by mouth daily.   Eucerin Advanced Repair Crea Apply 1 Application topically daily as needed.   hydrOXYzine 25 MG tablet Commonly known as: ATARAX Take 12.5 mg by mouth 2 (two) times daily as needed.   levothyroxine  50 MCG tablet Commonly known as: SYNTHROID  Take 50 mcg by mouth daily before breakfast.   losartan  50 MG tablet Commonly known as: COZAAR  Take 50 mg by mouth in the morning and at bedtime.   Melatonin 5 MG Caps Take 5 mg by mouth at bedtime.   memantine  5 MG tablet Commonly known as: Namenda  Take 2 tablets (10 mg total) by mouth 2 (two) times daily.   mirabegron ER 25 MG Tb24 tablet Commonly known as: MYRBETRIQ Take 25 mg by mouth daily.   multivitamin-lutein Caps capsule Take 1 capsule by mouth daily.   ondansetron  4 MG  tablet Commonly known as: ZOFRAN  Take 4 mg by mouth every 6 (six) hours as needed for nausea or vomiting.   rosuvastatin 10 MG tablet Commonly known as: CRESTOR Take 10 mg by mouth daily.   triamcinolone cream 0.1 % Commonly known as: KENALOG Apply 1 Application topically as needed.   VITAMIN B 12 PO Take 2,000 mcg by mouth daily.   Vitamin D3 50 MCG (2000 UT) Tabs Take by mouth daily.        Review of Systems  Unable to perform ROS: Dementia    Immunization History  Administered Date(s) Administered   Fluzone Influenza virus vaccine,trivalent (IIV3), split virus 11/22/2011   Influenza Inj Mdck Quad Pf 08/23/2016   Influenza Split 09/30/2013   Influenza, Mdck, Trivalent,PF 6+ MOS(egg free) 08/23/2016   Influenza, Quadrivalent, Recombinant, Inj, Pf 10/08/2019, 09/14/2020   Influenza,inj,Quad PF,6+ Mos 10/03/2013   Influenza-Unspecified 08/23/2016, 09/20/2022   MODERNA COVID-19 SARS-COV-2 PEDS BIVALENT BOOSTER 97yr-81yr 09/28/2021  Moderna SARS-COV2 Booster Vaccination 10/31/2020   Moderna Sars-Covid-2 Vaccination 12/28/2019, 01/26/2020, 10/21/2022   Pneumococcal Conjugate-13 01/09/2016   Pneumococcal Polysaccharide-23 08/12/2012, 10/03/2013   Td 08/16/2010   Td (Adult),5 Lf Tetanus Toxid, Preservative Free 08/16/2010   Tdap 01/16/2023   Zoster Recombinant(Shingrix) 10/15/2022, 01/16/2023   Zoster, Live 10/07/2006   Pertinent  Health Maintenance Due  Topic Date Due   INFLUENZA VACCINE  07/16/2024   DEXA SCAN  Discontinued      07/15/2023    2:29 PM 11/03/2023    4:55 PM 12/02/2023    3:22 PM 03/19/2024   10:38 AM 04/06/2024    7:11 PM  Fall Risk  Falls in the past year? 1 0 1 1 1   Was there an injury with Fall? 0 0 0 0 0  Fall Risk Category Calculator 1 0 1 1 1   Patient at Risk for Falls Due to History of fall(s);Mental status change;Impaired balance/gait;Impaired mobility No Fall Risks History of fall(s);Impaired balance/gait;Impaired mobility History of  fall(s);Impaired balance/gait History of fall(s);Impaired balance/gait  Fall risk Follow up Falls evaluation completed;Education provided;Falls prevention discussed Falls evaluation completed Falls evaluation completed;Education provided  Falls evaluation completed;Education provided   Functional Status Survey:    Vitals:   05/04/24 1020  BP: (!) 144/82  Pulse: 84  Resp: 18  Temp: 98.1 F (36.7 C)  SpO2: 95%  Weight: 136 lb 4.8 oz (61.8 kg)  Height: 5\' 4"  (1.626 m)   Body mass index is 23.4 kg/m. Physical Exam Vitals reviewed.  Constitutional:      General: She is not in acute distress. HENT:     Head: Normocephalic.     Right Ear: There is no impacted cerumen.     Left Ear: There is no impacted cerumen.     Nose: Nose normal.     Mouth/Throat:     Mouth: Mucous membranes are moist.  Eyes:     General:        Right eye: No discharge.        Left eye: No discharge.  Cardiovascular:     Rate and Rhythm: Normal rate and regular rhythm.     Pulses: Normal pulses.     Heart sounds: Normal heart sounds.  Pulmonary:     Effort: Pulmonary effort is normal.     Breath sounds: Normal breath sounds.  Abdominal:     General: Bowel sounds are normal.     Palpations: Abdomen is soft.  Musculoskeletal:     Cervical back: Neck supple.     Right lower leg: No edema.     Left lower leg: No edema.  Skin:    General: Skin is warm.     Capillary Refill: Capillary refill takes less than 2 seconds.  Neurological:     General: No focal deficit present.     Mental Status: She is alert. Mental status is at baseline.     Gait: Gait abnormal.  Psychiatric:        Mood and Affect: Mood normal.     Labs reviewed: Recent Labs    06/10/23 0000 09/23/23 0000 12/19/23 0000  NA 136* 138 140  K 5.5* 4.1 4.1  CL 100 102 104  CO2 18 21 24*  BUN 20 11 14   CREATININE 1.0 0.7 0.8  CALCIUM 9.1 9.5 9.2   Recent Labs    06/10/23 0000 09/23/23 0000  AST 85* 23  ALT 48* 12  ALKPHOS  113 82  ALBUMIN  3.4* 4.0   Recent  Labs    06/10/23 0000  WBC 13.6  HGB 14.4  HCT 44  PLT 175   Lab Results  Component Value Date   TSH 4.07 11/13/2022   No results found for: "HGBA1C" Lab Results  Component Value Date   TRIG 142 11/04/2013    Significant Diagnostic Results in last 30 days:  No results found.  Assessment/Plan: 1. Left ear impacted cerumen (Primary) - competed 2 rounds debrox - 05/20 ENT> flushed> moderate amount of wax  2. Dementia without behavioral disturbance (HCC) - no behaviors - dependent with some ADLs - ambulates with walker - cont Namenda   3. Essential hypertension, benign - controlled with losartan   4. Mixed hyperlipidemia - LDL stable - cont rosuvastatin  5. Mild depression - mood stable - cont Lexparo  6. OAB (overactive bladder) - cont Myrbetriq  7. Acquired hypothyroidism - TSH stable - cont levothyroxine   8. Primary insomnia - cont melatonin    Family/ staff Communication: plan discussed with patient and nurse  Labs/tests ordered:  cbc/diff and cmp 05/27

## 2024-05-05 DIAGNOSIS — L299 Pruritus, unspecified: Secondary | ICD-10-CM | POA: Diagnosis not present

## 2024-05-11 DIAGNOSIS — F0150 Vascular dementia without behavioral disturbance: Secondary | ICD-10-CM | POA: Diagnosis not present

## 2024-05-11 DIAGNOSIS — I10 Essential (primary) hypertension: Secondary | ICD-10-CM | POA: Diagnosis not present

## 2024-05-11 LAB — BASIC METABOLIC PANEL WITH GFR
BUN: 18 (ref 4–21)
CO2: 24 — AB (ref 13–22)
Chloride: 105 (ref 99–108)
Creatinine: 0.9 (ref 0.5–1.1)
Glucose: 102
Potassium: 4.4 meq/L (ref 3.5–5.1)
Sodium: 141 (ref 137–147)

## 2024-05-11 LAB — COMPREHENSIVE METABOLIC PANEL WITH GFR
Albumin: 3.7 (ref 3.5–5.0)
Calcium: 9.1 (ref 8.7–10.7)
Globulin: 1.8

## 2024-05-11 LAB — HEPATIC FUNCTION PANEL
ALT: 16 U/L (ref 7–35)
AST: 19 (ref 13–35)
Alkaline Phosphatase: 74 (ref 25–125)

## 2024-05-11 LAB — CBC: RBC: 4.39 (ref 3.87–5.11)

## 2024-05-11 LAB — CBC AND DIFFERENTIAL
HCT: 42 (ref 36–46)
Hemoglobin: 14.2 (ref 12.0–16.0)
Platelets: 247 10*3/uL (ref 150–400)
WBC: 7.9

## 2024-06-15 ENCOUNTER — Encounter: Payer: Self-pay | Admitting: Orthopedic Surgery

## 2024-06-15 ENCOUNTER — Non-Acute Institutional Stay (SKILLED_NURSING_FACILITY): Payer: Self-pay | Admitting: Orthopedic Surgery

## 2024-06-15 DIAGNOSIS — E782 Mixed hyperlipidemia: Secondary | ICD-10-CM | POA: Diagnosis not present

## 2024-06-15 DIAGNOSIS — N3281 Overactive bladder: Secondary | ICD-10-CM | POA: Diagnosis not present

## 2024-06-15 DIAGNOSIS — F0390 Unspecified dementia without behavioral disturbance: Secondary | ICD-10-CM

## 2024-06-15 DIAGNOSIS — I10 Essential (primary) hypertension: Secondary | ICD-10-CM | POA: Diagnosis not present

## 2024-06-15 DIAGNOSIS — F5101 Primary insomnia: Secondary | ICD-10-CM | POA: Diagnosis not present

## 2024-06-15 DIAGNOSIS — E039 Hypothyroidism, unspecified: Secondary | ICD-10-CM | POA: Diagnosis not present

## 2024-06-15 DIAGNOSIS — F32A Depression, unspecified: Secondary | ICD-10-CM

## 2024-06-15 NOTE — Progress Notes (Signed)
 [facility] Room Number: 131-A Place of Service:  SNF [phone]) Provider:  Greig Cluster, NP  PCP: Charlanne Fredia CROME, MD  Patient Care Team: Charlanne Fredia CROME, MD as PCP - General (Internal Medicine)  Extended Emergency Contact Information Primary Emergency Contact: Keach,Wallace Address: [address]          [place], [place] [address] [place] Home Phone: [phone] Mobile Phone: [phone] Relation: Spouse Secondary Emergency Contact: freeman,stan Mobile Phone: [phone] Relation: Son  Code Status:  DNR Goals of care: Advanced Directive information    06/15/2024   12:15 PM  Advanced Directives  Does Patient Have a Medical Advance Directive? Yes  Type of Estate agent of [place];Out of facility DNR (pink MOST or yellow form)  Does patient want to make changes to medical advance directive? No - Patient declined  Copy of Healthcare Power of Attorney in Chart? Yes - validated most recent copy scanned in chart (See row information)     Chief Complaint  Patient presents with   Medical Management of Chronic Issues    Routine visit. Discuss the need for Covid Booster.    HPI:  Pt is a 89 y.o. female seen today for medical management of chronic diseases.    She currently resides on the skilled nursing unit at [facility]. PMH: HTN, diverticulitis with pericolonic abscess, gangrenous gallbladder s/p abdominal surgery 2014, goiter, hypothyroidism, OAB, dementia, insomnia, and gait abnormality.   HTN- BUN/creat 18/0.9 05/11/2024, remains on losartan  HLD- LDL 55, remains on rosuvastatin, ALT/AST 16/19 05/11/2024  Dementia- categorized by neurology  CNS degenerative disorder with vascular component, recent MoCA 11/30 03/02/2024, MMSE 15/30 07/29/2023, 04/2021 MRI brain showed moderate to severe atrophy and moderate ventriculometry/ moderate chronic small vessel ischemic disease, unsuccessful trial  of sinemet , remains on Namenda  Depression- Na+ 140 12/22/2023, no mood changes, continues to have flat affect, remains on Lexapro  OAB- remains on Myrbetriq Hypothyroidism- TSH 1.43 11/04/2023, remains on levothyroxine  Insomnia- remains on melatonin  Recent blood pressures:  06/30- 123/74  06/29- 143/81  06/27- 109/70  Recent weights:  06/01- 137 lbs  05/01- 136 lbs  04/01- 131 lbs      Past Medical History:  Diagnosis Date   Anemia    Bowel obstruction (HCC)    Calculus of gallbladder with acute cholecystitis, without mention of obstruction 10/01/2013   Open chole on 10/26/13. 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    COPD (chronic obstructive pulmonary disease) (HCC)    Depression    sold beach house 01/2012 (10/25/2013)   Gall stones    Goiter    H/O sebaceous cyst    Hepatic cyst 10/01/2013   CT   History of diverticular abscess 09/2013   Hyperlipemia    Hypertension    was taking RX; took me off it 09/2013 (10/25/2013)   Hypothyroidism    goiter   Insomnia    MCI (mild cognitive impairment)    Osteopenia    Protein calorie malnutrition (HCC)    malabsorption   Seasonal allergies    Vitamin B12 deficiency    Past Surgical History:  Procedure Laterality Date   ABCESS DRAINAGE  2014   had drain put in & stayed for ~ 1 wk to 10 day; just came out ~ 10 days ago (10/25/2013)   CATARACT EXTRACTION W/ INTRAOCULAR LENS IMPLANT Left ~ 2010   CHOLECYSTECTOMY N/A 10/26/2013   Procedure: ATTEMPTED  LAPAROSCOPIC CONVERTED TO  OPEN CHOLECYSTECTOMY WITH INTRAOPERATIVE CHOLANGIOGRAM; REPAIR OF CHOLECOLONIC FISTULA; cholecystoduodenal fistula repair; lysis of adhesions; drainage of pelvic abscess; omental patching of duodenal repair;  Surgeon: Debby LABOR. Cornett, MD;  Location: [facility] OR;  Service: General;  Laterality: N/A;   COLON RESECTION  2014   DILATION AND CURETTAGE OF UTERUS     EXCISION MASS NECK Left  10/31/2016   Procedure: EXCISION CYST LEFT NECK;  Surgeon: Debby Shipper, MD;  Location: [facility];  Service: General;  Laterality: Left;  EXCISION CYST LEFT NECK    Allergies  Allergen Reactions   Ambien  [Zolpidem  Tartrate] Other (See Comments)    Makes her unsteady   Cymbalta [Duloxetine Hcl] Other (See Comments)    kinda went nuts   Halcion [Triazolam] Other (See Comments)    kinda went nuts    Allergies as of 06/15/2024       Reactions   Ambien  [zolpidem  Tartrate] Other (See Comments)   Makes her unsteady   Cymbalta [duloxetine Hcl] Other (See Comments)   kinda went nuts   Halcion [triazolam] Other (See Comments)   kinda went nuts        Medication List        Accurate as of June 15, 2024 12:16 PM. If you have any questions, ask your nurse or doctor.          amLODipine 2.5 MG tablet Commonly known as: NORVASC Take 2.5 mg by mouth daily.   ascorbic acid 500 MG tablet Commonly known as: VITAMIN C Take 500 mg by mouth daily.   escitalopram 5 MG tablet Commonly known as: LEXAPRO Take 15 mg by mouth daily.   Eucerin Advanced Repair Crea Apply 1 Application topically daily as needed.   levothyroxine  50 MCG tablet Commonly known as: SYNTHROID  Take 50 mcg by mouth daily before breakfast.   losartan  50 MG tablet Commonly known as: COZAAR  Take 50 mg by mouth in the morning and at bedtime.   Melatonin 5 MG Caps Take 5 mg by mouth at bedtime.   memantine  5 MG tablet Commonly known as: Namenda  Take 2 tablets (10 mg total) by mouth 2 (two) times daily.   mirabegron ER 25 MG Tb24 tablet Commonly known as: MYRBETRIQ Take 25 mg by mouth daily.   multivitamin-lutein Caps capsule Take 1 capsule by mouth daily.   ondansetron  4 MG tablet Commonly known as: ZOFRAN  Take 4 mg by mouth every 6 (six) hours as needed for nausea or vomiting.   rosuvastatin 10 MG tablet Commonly known as: CRESTOR Take 10 mg by mouth daily.    triamcinolone cream 0.1 % Commonly known as: KENALOG Apply 1 Application topically as needed.   VITAMIN B 12 PO Take 2,000 mcg by mouth daily.   Vitamin D3 50 MCG (2000 UT) Tabs Take by mouth daily.        Review of Systems  Unable to perform ROS: Dementia    Immunization History  Administered Date(s) Administered   Fluzone Influenza virus vaccine,trivalent (IIV3), split virus 11/22/2011   Influenza Inj Mdck Quad Pf 08/23/2016   Influenza Split 09/30/2013   Influenza, Mdck, Trivalent,PF 6+ MOS(egg free) 08/23/2016   Influenza, Quadrivalent, Recombinant, Inj, Pf 10/08/2019, 09/14/2020   Influenza,inj,Quad PF,6+ Mos 10/03/2013   Influenza-Unspecified 08/23/2016, 09/20/2022   MODERNA COVID-19 SARS-COV-2 PEDS BIVALENT BOOSTER 90yr-89yr 09/28/2021   Moderna SARS-COV2 Booster Vaccination 10/31/2020   Moderna Sars-Covid-2 Vaccination 12/28/2019, 01/26/2020, 10/21/2022   Pneumococcal Conjugate-13 01/09/2016   Pneumococcal Polysaccharide-23 08/12/2012, 10/03/2013   Td 08/16/2010  Td (Adult),5 Lf Tetanus Toxid, Preservative Free 08/16/2010   Tdap 01/16/2023   Zoster Recombinant(Shingrix) 10/15/2022, 01/16/2023   Zoster, Live 10/07/2006   Pertinent  Health Maintenance Due  Topic Date Due   INFLUENZA VACCINE  07/16/2024   DEXA SCAN  Discontinued      07/15/2023    2:29 PM 11/03/2023    4:55 PM 12/02/2023    3:22 PM 03/19/2024   10:38 AM 04/06/2024    7:11 PM  Fall Risk  Falls in the past year? 1 0 1 1 1   Was there an injury with Fall? 0 0 0 0 0  Fall Risk Category Calculator 1 0 1 1 1   Patient at Risk for Falls Due to History of fall(s);Mental status change;Impaired balance/gait;Impaired mobility No Fall Risks History of fall(s);Impaired balance/gait;Impaired mobility History of fall(s);Impaired balance/gait History of fall(s);Impaired balance/gait  Fall risk Follow up Falls evaluation completed;Education provided;Falls prevention discussed Falls evaluation completed Falls  evaluation completed;Education provided  Falls evaluation completed;Education provided   Functional Status Survey:    Vitals:   06/15/24 1210  BP: 123/74  Pulse: 80  Resp: 16  Temp: 98.1 F (36.7 C)  SpO2: 94%  Weight: 137 lb 1.3 oz (62.2 kg)  Height: 5' 4 (1.626 m)   Body mass index is 23.53 kg/m. Physical Exam Vitals reviewed.  Constitutional:      General: She is not in acute distress. HENT:     Head: Normocephalic.     Right Ear: There is no impacted cerumen.     Left Ear: There is no impacted cerumen.     Nose: Nose normal.     Mouth/Throat:     Mouth: Mucous membranes are moist.   Eyes:     General:        Right eye: No discharge.        Left eye: No discharge.    Cardiovascular:     Rate and Rhythm: Normal rate and regular rhythm.     Pulses: Normal pulses.     Heart sounds: Normal heart sounds.  Pulmonary:     Effort: Pulmonary effort is normal.     Breath sounds: Normal breath sounds.  Abdominal:     General: There is no distension.     Palpations: Abdomen is soft.     Tenderness: There is no abdominal tenderness.   Musculoskeletal:     Cervical back: Neck supple.     Right lower leg: No edema.     Left lower leg: No edema.   Skin:    General: Skin is warm.     Capillary Refill: Capillary refill takes less than 2 seconds.   Neurological:     General: No focal deficit present.     Mental Status: She is alert.     Gait: Gait abnormal.   Psychiatric:     Comments: Flat affect, follows commands, alert to self/familiar face     Labs reviewed: Recent Labs    09/23/23 0000 12/19/23 0000 05/11/24 0000  NA 138 140 141  K 4.1 4.1 4.4  CL 102 104 105  CO2 21 24* 24*  BUN 11 14 18   CREATININE 0.7 0.8 0.9  CALCIUM 9.5 9.2 9.1   Recent Labs    09/23/23 0000 05/11/24 0000  AST 23 19  ALT 12 16  ALKPHOS 82 74  ALBUMIN  4.0 3.7   Recent Labs    05/11/24 0000  WBC 7.9  HGB 14.2  HCT 42  PLT 247  Lab Results  Component Value  Date   TSH 4.07 11/13/2022   No results found for: HGBA1C Lab Results  Component Value Date   TRIG 142 11/04/2013    Significant Diagnostic Results in last 30 days:  No results found.  Assessment/Plan 1. Essential hypertension, benign (Primary) - controlled with losartan   2. Mixed hyperlipidemia - LDL stable - cont rosuvastatin  3. Dementia without behavioral disturbance (HCC) - no behaviors - MMSE was 15/30 07/2023 - associated with CNS degenerative disorder - weight stable - dependent with some ADLs - cont Namenda   4. Mild depression - no mood changes - cont Namenda   5. Overactive bladder - cont Myrbetriq  6. Acquired hypothyroidism - cont levothyroxine   7. Primary insomnia - cont melatonin    Family/ staff Communication: plan discussed with patient and nurse  Labs/tests ordered:  none

## 2024-08-03 ENCOUNTER — Encounter: Payer: Self-pay | Admitting: Orthopedic Surgery

## 2024-08-03 ENCOUNTER — Non-Acute Institutional Stay (SKILLED_NURSING_FACILITY): Payer: Self-pay | Admitting: Orthopedic Surgery

## 2024-08-03 DIAGNOSIS — I10 Essential (primary) hypertension: Secondary | ICD-10-CM | POA: Diagnosis not present

## 2024-08-03 DIAGNOSIS — E039 Hypothyroidism, unspecified: Secondary | ICD-10-CM | POA: Diagnosis not present

## 2024-08-03 DIAGNOSIS — E559 Vitamin D deficiency, unspecified: Secondary | ICD-10-CM | POA: Diagnosis not present

## 2024-08-03 DIAGNOSIS — F32A Depression, unspecified: Secondary | ICD-10-CM

## 2024-08-03 DIAGNOSIS — N3281 Overactive bladder: Secondary | ICD-10-CM

## 2024-08-03 DIAGNOSIS — E782 Mixed hyperlipidemia: Secondary | ICD-10-CM

## 2024-08-03 DIAGNOSIS — F5101 Primary insomnia: Secondary | ICD-10-CM

## 2024-08-03 DIAGNOSIS — F0390 Unspecified dementia without behavioral disturbance: Secondary | ICD-10-CM

## 2024-08-03 NOTE — Progress Notes (Signed)
 Location[facility] Room Number: 131 P Place of Service:  SNF (31) Provider: Greig Cluster, NP    Patient Care Team: Charlanne Fredia CROME, MD as PCP - General (Internal Medicine)  Extended Emergency Contact Information Primary Emergency Contact: Gellatly,Wallace Address: [address]          [place], [place] [address] [place] Home Phone: [phone] Mobile Phone: [phone] Relation: Spouse Secondary Emergency Contact: freeman,stan Mobile Phone: [phone] Relation: Son  Code Status:  DNR Goals of care: Advanced Directive information    06/15/2024   12:15 PM  Advanced Directives  Does Patient Have a Medical Advance Directive? Yes  Type of Estate agent of [place];Out of facility DNR (pink MOST or yellow form)  Does patient want to make changes to medical advance directive? No - Patient declined  Copy of Healthcare Power of Attorney in Chart? Yes - validated most recent copy scanned in chart (See row information)     Chief Complaint  Patient presents with   Routine Visit    HPI:  Pt is a 89 y.o. female seen today for medical management of chronic diseases.    She currently resides on the skilled nursing unit at [facility]. PMH: HTN, diverticulitis with pericolonic abscess, gangrenous gallbladder s/p abdominal surgery 2014, goiter, hypothyroidism, OAB, dementia, insomnia, and gait abnormality.   Vitamin D deficiency-  vitamin D level 42.7 04/2021, remains on vitamin D HTN- BUN/creat 18/0.9 05/11/2024, remains on losartan  HLD- LDL 55, remains on rosuvastatin, ALT/AST 16/19 05/11/2024  Dementia- categorized by neurology  CNS degenerative disorder with vascular component, recent MoCA 11/30 03/02/2024, MMSE 15/30 07/29/2023, 04/2021 MRI brain showed moderate to severe atrophy and moderate ventriculometry/ moderate chronic small vessel ischemic disease, unsuccessful trial of sinemet , remains on  Namenda  Depression- Na+ 141 05/11/2024, no mood changes, continues to have flat affect, remains on Lexapro  OAB- remains on Myrbetriq Hypothyroidism- TSH 1.43 11/04/2023, remains on levothyroxine  Insomnia- remains on melatonin  Recent weights:  08/01- 138 lbs  07/01- 141 lbs  06/01- 137 lbs  Recent blood pressures:  08/19- 144/83  08/18- 147/85, 123/73  08/17- 158/80, 148/73   Past Medical History:  Diagnosis Date   Anemia    Bowel obstruction (HCC)    Calculus of gallbladder with acute cholecystitis, without mention of obstruction 10/01/2013   Open chole on 10/26/13. 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    COPD (chronic obstructive pulmonary disease) (HCC)    Depression    sold beach house 01/2012 (10/25/2013)   Gall stones    Goiter    H/O sebaceous cyst    Hepatic cyst 10/01/2013   CT   History of diverticular abscess 09/2013   Hyperlipemia    Hypertension    was taking RX; took me off it 09/2013 (10/25/2013)   Hypothyroidism    goiter   Insomnia    MCI (mild cognitive impairment)    Osteopenia    Protein calorie malnutrition (HCC)    malabsorption   Seasonal allergies    Vitamin B12 deficiency    Past Surgical History:  Procedure Laterality Date   ABCESS DRAINAGE  2014   had drain put in & stayed for ~ 1 wk to 10 day; just came out ~ 10 days ago (10/25/2013)   CATARACT EXTRACTION W/ INTRAOCULAR LENS IMPLANT Left ~ 2010   CHOLECYSTECTOMY N/A 10/26/2013   Procedure: ATTEMPTED  LAPAROSCOPIC CONVERTED TO OPEN CHOLECYSTECTOMY WITH INTRAOPERATIVE CHOLANGIOGRAM; REPAIR OF CHOLECOLONIC  FISTULA; cholecystoduodenal fistula repair; lysis of adhesions; drainage of pelvic abscess; omental patching of duodenal repair;  Surgeon: Debby LABOR. Cornett, MD;  Location: [facility];  Service: General;  Laterality: N/A;   COLON RESECTION  2014   DILATION AND CURETTAGE OF UTERUS     EXCISION MASS NECK Left 10/31/2016    Procedure: EXCISION CYST LEFT NECK;  Surgeon: Debby Shipper, MD;  Location: [facility];  Service: General;  Laterality: Left;  EXCISION CYST LEFT NECK    Allergies  Allergen Reactions   Ambien  [Zolpidem  Tartrate] Other (See Comments)    Makes her unsteady   Cymbalta [Duloxetine Hcl] Other (See Comments)    kinda went nuts   Halcion [Triazolam] Other (See Comments)    kinda went nuts    Outpatient Encounter Medications as of 08/03/2024  Medication Sig   amLODipine (NORVASC) 2.5 MG tablet Take 2.5 mg by mouth daily.   ascorbic acid (VITAMIN C) 500 MG tablet Take 500 mg by mouth daily.   Cholecalciferol (VITAMIN D3) 50 MCG (2000 UT) TABS Take by mouth daily.   Cyanocobalamin  (VITAMIN B 12 PO) Take 2,000 mcg by mouth daily.   escitalopram (LEXAPRO) 5 MG tablet Take 15 mg by mouth daily.   levothyroxine  (SYNTHROID ) 50 MCG tablet Take 50 mcg by mouth daily before breakfast.   losartan  (COZAAR ) 50 MG tablet Take 50 mg by mouth in the morning and at bedtime.   Melatonin 5 MG CAPS Take 5 mg by mouth at bedtime.   memantine  (NAMENDA ) 5 MG tablet Take 2 tablets (10 mg total) by mouth 2 (two) times daily.   mirabegron ER (MYRBETRIQ) 25 MG TB24 tablet Take 25 mg by mouth daily.   multivitamin-lutein (OCUVITE-LUTEIN) CAPS capsule Take 1 capsule by mouth daily.   rosuvastatin (CRESTOR) 10 MG tablet Take 10 mg by mouth daily.   Emollient (EUCERIN ADVANCED REPAIR) CREA Apply 1 Application topically daily as needed.   ondansetron  (ZOFRAN ) 4 MG tablet Take 4 mg by mouth every 6 (six) hours as needed for nausea or vomiting.   triamcinolone cream (KENALOG) 0.1 % Apply 1 Application topically as needed.   No facility-administered encounter medications on file as of 08/03/2024.    Review of Systems  Unable to perform ROS: Dementia    Immunization History  Administered Date(s) Administered   Fluzone Influenza virus vaccine,trivalent (IIV3), split virus 11/22/2011   Influenza Inj  Mdck Quad Pf 08/23/2016   Influenza Split 09/30/2013   Influenza, Mdck, Trivalent,PF 6+ MOS(egg free) 08/23/2016   Influenza, Quadrivalent, Recombinant, Inj, Pf 10/08/2019, 09/14/2020   Influenza,inj,Quad PF,6+ Mos 10/03/2013   Influenza-Unspecified 08/23/2016, 09/20/2022   MODERNA COVID-19 SARS-COV-2 PEDS BIVALENT BOOSTER 65yr-100yr 09/28/2021   Moderna SARS-COV2 Booster Vaccination 10/31/2020   Moderna Sars-Covid-2 Vaccination 12/28/2019, 01/26/2020, 10/21/2022   Pneumococcal Conjugate-13 01/09/2016   Pneumococcal Polysaccharide-23 08/12/2012, 10/03/2013   Td 08/16/2010   Td (Adult),5 Lf Tetanus Toxid, Preservative Free 08/16/2010   Tdap 01/16/2023   Zoster Recombinant(Shingrix) 10/15/2022, 01/16/2023   Zoster, Live 10/07/2006   Pertinent  Health Maintenance Due  Topic Date Due   INFLUENZA VACCINE  07/16/2024   DEXA SCAN  Discontinued      07/15/2023    2:29 PM 11/03/2023    4:55 PM 12/02/2023    3:22 PM 03/19/2024   10:38 AM 04/06/2024    7:11 PM  Fall Risk  Falls in the past year? 1 0 1 1 1   Was there an injury with Fall? 0 0 0 0 0  Fall Risk Category Calculator 1 0 1 1 1   Patient at Risk for Falls Due to History of fall(s);Mental status change;Impaired balance/gait;Impaired mobility No Fall Risks History of fall(s);Impaired balance/gait;Impaired mobility History of fall(s);Impaired balance/gait History of fall(s);Impaired balance/gait  Fall risk Follow up Falls evaluation completed;Education provided;Falls prevention discussed Falls evaluation completed Falls evaluation completed;Education provided  Falls evaluation completed;Education provided   Functional Status Survey:    Vitals:   08/03/24 1234  BP: (!) 144/83  Pulse: 62  Resp: 18  Temp: (!) 97.2 F (36.2 C)  SpO2: 96%  Weight: 138 lb (62.6 kg)  Height: 5' 4 (1.626 m)   Body mass index is 23.69 kg/m. Physical Exam Vitals reviewed.  Constitutional:      General: She is not in acute distress. HENT:     Head:  Normocephalic.     Right Ear: There is no impacted cerumen.     Left Ear: There is no impacted cerumen.     Nose: Nose normal.     Mouth/Throat:     Mouth: Mucous membranes are moist.  Eyes:     General:        Right eye: No discharge.        Left eye: No discharge.  Cardiovascular:     Rate and Rhythm: Normal rate and regular rhythm.     Pulses: Normal pulses.     Heart sounds: Normal heart sounds.  Pulmonary:     Effort: Pulmonary effort is normal.     Breath sounds: Normal breath sounds.  Abdominal:     General: Bowel sounds are normal.     Palpations: Abdomen is soft.  Musculoskeletal:     Cervical back: Neck supple.     Right lower leg: No edema.     Left lower leg: No edema.  Skin:    General: Skin is warm.     Capillary Refill: Capillary refill takes less than 2 seconds.  Neurological:     General: No focal deficit present.     Mental Status: She is alert. Mental status is at baseline.     Motor: Weakness present.     Gait: Gait abnormal.  Psychiatric:        Mood and Affect: Mood normal.     Labs reviewed: Recent Labs    09/23/23 0000 12/19/23 0000 05/11/24 0000  NA 138 140 141  K 4.1 4.1 4.4  CL 102 104 105  CO2 21 24* 24*  BUN 11 14 18   CREATININE 0.7 0.8 0.9  CALCIUM 9.5 9.2 9.1   Recent Labs    09/23/23 0000 05/11/24 0000  AST 23 19  ALT 12 16  ALKPHOS 82 74  ALBUMIN  4.0 3.7   Recent Labs    05/11/24 0000  WBC 7.9  HGB 14.2  HCT 42  PLT 247   Lab Results  Component Value Date   TSH 4.07 11/13/2022   No results found for: HGBA1C Lab Results  Component Value Date   TRIG 142 11/04/2013    Significant Diagnostic Results in last 30 days:  No results found.  Assessment/Plan 1. Vitamin D deficiency (Primary) - no recent level> recheck next routine lab draw - cont vitamin D  2. Essential hypertension, benign - controlled with low dose norvasc and losartan   3. Mixed hyperlipidemia - cont rosuvastatin  4. Dementia without  behavioral disturbance (HCC) - no behaviors - weight stable - dependent with ADLs except feeding - cont Namenda   5. Mild depression - no  changes in mood - cont escitalopram  6. Overactive bladder - cont myrbetriq  7. Acquired hypothyroidism - cont levothyroxine   8. Primary insomnia - cont melatonin    Family/ staff Communication: plan discussed with patient and nurse  Labs/tests ordered:  none

## 2024-08-30 ENCOUNTER — Encounter: Payer: Self-pay | Admitting: Internal Medicine

## 2024-08-30 ENCOUNTER — Non-Acute Institutional Stay (SKILLED_NURSING_FACILITY): Payer: Self-pay | Admitting: Internal Medicine

## 2024-08-30 DIAGNOSIS — E782 Mixed hyperlipidemia: Secondary | ICD-10-CM

## 2024-08-30 DIAGNOSIS — I10 Essential (primary) hypertension: Secondary | ICD-10-CM | POA: Diagnosis not present

## 2024-08-30 DIAGNOSIS — E039 Hypothyroidism, unspecified: Secondary | ICD-10-CM | POA: Diagnosis not present

## 2024-08-30 DIAGNOSIS — F32A Depression, unspecified: Secondary | ICD-10-CM

## 2024-08-30 DIAGNOSIS — F5101 Primary insomnia: Secondary | ICD-10-CM

## 2024-08-30 DIAGNOSIS — F0390 Unspecified dementia without behavioral disturbance: Secondary | ICD-10-CM

## 2024-08-30 DIAGNOSIS — N3281 Overactive bladder: Secondary | ICD-10-CM

## 2024-08-30 NOTE — Progress Notes (Signed)
 [facility] Room Number: 131-P Place of Service:  SNF [phone]) Provider:  Krystal Bring  PCP: Bring Fredia CROME, MD  Patient Care Team: Bring Fredia CROME, MD as PCP - General (Internal Medicine)  Extended Emergency Contact Information Primary Emergency Contact: Utter,Wallace Address: [address]          [place], [place] [address] [place] Home Phone: [phone] Mobile Phone: [phone] Relation: Spouse Secondary Emergency Contact: freeman,stan Mobile Phone: [phone] Relation: Son  Code Status:  DNR Goals of care: Advanced Directive information    08/30/2024   11:49 AM  Advanced Directives  Does Patient Have a Medical Advance Directive? Yes  Type of Estate agent of [place];Out of facility DNR (pink MOST or yellow form)  Does patient want to make changes to medical advance directive? No - Patient declined  Copy of Healthcare Power of Attorney in Chart? Yes - validated most recent copy scanned in chart (See row information)     Chief Complaint  Patient presents with   Medical Management of Chronic Issues    Routine visit. Discuss the need for Influenza vaccine, and Covid Booster.    HPI:  Pt is a 89 y.o. female seen today for medical management of chronic diseases.    Lives in [facility] [place]   Patient has a history of dementia Recent MMSE score 11/30 in Neurology Office  MRI of the brain has showed generalized atrophy ventricular megaly Depression, Hypothyroidism, HLD     She is stable. No new Nursing issues. No Behavior issues Twana Griffon some weight  Walks with her walker No Falls Wt Readings from Last 3 Encounters:  08/30/24 141 lb 6.4 oz (64.1 kg)  08/03/24 138 lb (62.6 kg)  06/15/24 137 lb 1.3 oz (62.2 kg)   Past Medical History:  Diagnosis Date   Anemia    Bowel obstruction (HCC)    Calculus of gallbladder with acute cholecystitis, without mention of  obstruction 10/01/2013   Open chole on 10/26/13. 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    COPD (chronic obstructive pulmonary disease) (HCC)    Depression    sold beach house 01/2012 (10/25/2013)   Gall stones    Goiter    H/O sebaceous cyst    Hepatic cyst 10/01/2013   CT   History of diverticular abscess 09/2013   Hyperlipemia    Hypertension    was taking RX; took me off it 09/2013 (10/25/2013)   Hypothyroidism    goiter   Insomnia    MCI (mild cognitive impairment)    Osteopenia    Protein calorie malnutrition (HCC)    malabsorption   Seasonal allergies    Vitamin B12 deficiency    Past Surgical History:  Procedure Laterality Date   ABCESS DRAINAGE  2014   had drain put in & stayed for ~ 1 wk to 10 day; just came out ~ 10 days ago (10/25/2013)   CATARACT EXTRACTION W/ INTRAOCULAR LENS IMPLANT Left ~ 2010   CHOLECYSTECTOMY N/A 10/26/2013   Procedure: ATTEMPTED  LAPAROSCOPIC CONVERTED TO OPEN CHOLECYSTECTOMY WITH INTRAOPERATIVE CHOLANGIOGRAM; REPAIR OF CHOLECOLONIC FISTULA; cholecystoduodenal fistula repair; lysis of adhesions; drainage of pelvic abscess; omental patching of duodenal repair;  Surgeon: Debby LABOR. Cornett, MD;  Location: [facility];  Service: General;  Laterality: N/A;   COLON RESECTION  2014   DILATION AND CURETTAGE OF UTERUS     EXCISION MASS NECK Left 10/31/2016  Procedure: EXCISION CYST LEFT NECK;  Surgeon: Debby Shipper, MD;  Location: [facility];  Service: General;  Laterality: Left;  EXCISION CYST LEFT NECK    Allergies  Allergen Reactions   Ambien  [Zolpidem  Tartrate] Other (See Comments)    Makes her unsteady   Cymbalta [Duloxetine Hcl] Other (See Comments)    kinda went nuts   Halcion [Triazolam] Other (See Comments)    kinda went nuts    Allergies as of 08/30/2024       Reactions   Ambien  [zolpidem  Tartrate] Other (See Comments)   Makes her unsteady   Cymbalta  [duloxetine Hcl] Other (See Comments)   kinda went nuts   Halcion [triazolam] Other (See Comments)   kinda went nuts        Medication List        Accurate as of August 30, 2024 11:49 AM. If you have any questions, ask your nurse or doctor.          amLODipine 2.5 MG tablet Commonly known as: NORVASC Take 2.5 mg by mouth daily.   ascorbic acid 500 MG tablet Commonly known as: VITAMIN C Take 500 mg by mouth daily.   escitalopram 5 MG tablet Commonly known as: LEXAPRO Take 15 mg by mouth daily.   Eucerin Advanced Repair Crea Apply 1 Application topically daily as needed.   levothyroxine  50 MCG tablet Commonly known as: SYNTHROID  Take 50 mcg by mouth daily before breakfast.   losartan  50 MG tablet Commonly known as: COZAAR  Take 50 mg by mouth in the morning and at bedtime.   Melatonin 5 MG Caps Take 5 mg by mouth at bedtime.   memantine  5 MG tablet Commonly known as: Namenda  Take 2 tablets (10 mg total) by mouth 2 (two) times daily.   Milk of Magnesia 7.75 % suspension Generic drug: magnesium  hydroxide Take 30 mLs by mouth daily as needed for mild constipation.   mirabegron ER 25 MG Tb24 tablet Commonly known as: MYRBETRIQ Take 25 mg by mouth daily.   multivitamin-lutein Caps capsule Take 1 capsule by mouth daily.   ABC COMPLETE SENIOR WOMENS 50+ PO Take 1 tablet by mouth every morning.   ondansetron  4 MG tablet Commonly known as: ZOFRAN  Take 4 mg by mouth every 6 (six) hours as needed for nausea or vomiting.   rosuvastatin 10 MG tablet Commonly known as: CRESTOR Take 10 mg by mouth daily.   triamcinolone cream 0.1 % Commonly known as: KENALOG Apply 1 Application topically every 8 (eight) hours as needed.   VITAMIN B 12 PO Take 2,000 mcg by mouth daily.   Vitamin D3 50 MCG (2000 UT) Tabs Take by mouth daily.        Review of Systems  Unable to perform ROS: Dementia    Immunization History  Administered Date(s) Administered    Fluzone Influenza virus vaccine,trivalent (IIV3), split virus 11/22/2011   Influenza Inj Mdck Quad Pf 08/23/2016   Influenza Split 09/30/2013   Influenza, Mdck, Trivalent,PF 6+ MOS(egg free) 08/23/2016   Influenza, Quadrivalent, Recombinant, Inj, Pf 10/08/2019, 09/14/2020   Influenza,inj,Quad PF,6+ Mos 10/03/2013   Influenza-Unspecified 08/23/2016, 09/20/2022   MODERNA COVID-19 SARS-COV-2 PEDS BIVALENT BOOSTER 40yr-72yr 09/28/2021   Moderna SARS-COV2 Booster Vaccination 10/31/2020   Moderna Sars-Covid-2 Vaccination 12/28/2019, 01/26/2020, 10/21/2022   Pneumococcal Conjugate-13 01/09/2016   Pneumococcal Polysaccharide-23 08/12/2012, 10/03/2013   Td 08/16/2010   Td (Adult),5 Lf Tetanus Toxid, Preservative Free 08/16/2010   Tdap 01/16/2023   Zoster Recombinant(Shingrix) 10/15/2022, 01/16/2023   Zoster,  Live 10/07/2006   Pertinent  Health Maintenance Due  Topic Date Due   Influenza Vaccine  07/16/2024   DEXA SCAN  Discontinued      11/03/2023    4:55 PM 12/02/2023    3:22 PM 03/19/2024   10:38 AM 04/06/2024    7:11 PM 08/03/2024    3:27 PM  Fall Risk  Falls in the past year? 0 1 1 1  0  Was there an injury with Fall? 0 0 0 0 0  Fall Risk Category Calculator 0 1 1 1  0  Patient at Risk for Falls Due to No Fall Risks History of fall(s);Impaired balance/gait;Impaired mobility History of fall(s);Impaired balance/gait History of fall(s);Impaired balance/gait History of fall(s);Impaired balance/gait  Fall risk Follow up Falls evaluation completed Falls evaluation completed;Education provided  Falls evaluation completed;Education provided Falls evaluation completed;Education provided   Functional Status Survey:    Vitals:   08/30/24 1144  BP: (!) 140/82  Pulse: 67  Resp: 16  Temp: 98.1 F (36.7 C)  SpO2: 94%  Weight: 141 lb 6.4 oz (64.1 kg)  Height: 5' 4 (1.626 m)   Body mass index is 24.27 kg/m. Physical Exam Vitals reviewed.  Constitutional:      Appearance: Normal  appearance.  HENT:     Head: Normocephalic.     Nose: Nose normal.     Mouth/Throat:     Mouth: Mucous membranes are moist.     Pharynx: Oropharynx is clear.  Eyes:     Pupils: Pupils are equal, round, and reactive to light.  Cardiovascular:     Rate and Rhythm: Normal rate and regular rhythm.     Pulses: Normal pulses.     Heart sounds: Murmur heard.  Pulmonary:     Effort: Pulmonary effort is normal.     Breath sounds: Normal breath sounds.  Abdominal:     General: Abdomen is flat. Bowel sounds are normal.     Palpations: Abdomen is soft.  Musculoskeletal:        General: No swelling.     Cervical back: Neck supple.     Comments: Mild swelling in her ankle area  Skin:    General: Skin is warm.  Neurological:     General: No focal deficit present.     Mental Status: She is alert and oriented to person, place, and time.  Psychiatric:        Mood and Affect: Mood normal.        Thought Content: Thought content normal.     Labs reviewed: Recent Labs    09/23/23 0000 12/19/23 0000 05/11/24 0000  NA 138 140 141  K 4.1 4.1 4.4  CL 102 104 105  CO2 21 24* 24*  BUN 11 14 18   CREATININE 0.7 0.8 0.9  CALCIUM 9.5 9.2 9.1   Recent Labs    09/23/23 0000 05/11/24 0000  AST 23 19  ALT 12 16  ALKPHOS 82 74  ALBUMIN  4.0 3.7   Recent Labs    05/11/24 0000  WBC 7.9  HGB 14.2  HCT 42  PLT 247   Lab Results  Component Value Date   TSH 4.07 11/13/2022   No results found for: HGBA1C Lab Results  Component Value Date   TRIG 142 11/04/2013    Significant Diagnostic Results in last 30 days:  No results found.  Assessment/Plan 1. Essential hypertension, benign (Primary) Norvasc and Cozaar  2. Mixed hyperlipidemia Crestor  3. Dementia without behavioral disturbance [facility]) Doing well in SNF On  Namenda   4. Mild depression Lexapro  5. Overactive bladder Myrbetriq  6. Acquired hypothyroidism TSH normal in 11/24  7. Primary  insomnia Melatonin  Family/ staff Communication:   Labs/tests ordered:

## 2024-09-21 ENCOUNTER — Non-Acute Institutional Stay (SKILLED_NURSING_FACILITY): Payer: Self-pay | Admitting: Orthopedic Surgery

## 2024-09-21 ENCOUNTER — Encounter: Payer: Self-pay | Admitting: Orthopedic Surgery

## 2024-09-21 DIAGNOSIS — N3281 Overactive bladder: Secondary | ICD-10-CM

## 2024-09-21 DIAGNOSIS — F01B3 Vascular dementia, moderate, with mood disturbance: Secondary | ICD-10-CM | POA: Diagnosis not present

## 2024-09-21 DIAGNOSIS — E039 Hypothyroidism, unspecified: Secondary | ICD-10-CM

## 2024-09-21 DIAGNOSIS — F32A Depression, unspecified: Secondary | ICD-10-CM | POA: Diagnosis not present

## 2024-09-21 DIAGNOSIS — E782 Mixed hyperlipidemia: Secondary | ICD-10-CM

## 2024-09-21 DIAGNOSIS — E559 Vitamin D deficiency, unspecified: Secondary | ICD-10-CM | POA: Diagnosis not present

## 2024-09-21 DIAGNOSIS — I10 Essential (primary) hypertension: Secondary | ICD-10-CM

## 2024-09-21 DIAGNOSIS — F5101 Primary insomnia: Secondary | ICD-10-CM

## 2024-09-21 NOTE — Progress Notes (Unsigned)
 [facility] Room Number: [facility] 131P Place of Service:  SNF [phone]) Provider:  Greig Cluster, NP  PCP: Charlanne Fredia CROME, MD  Patient Care Team: Charlanne Fredia CROME, MD as PCP - General (Internal Medicine)  Extended Emergency Contact Information Primary Emergency Contact: Jaye,Wallace Address: [address]          [place], [place] [address] [place] Home Phone: [phone] Mobile Phone: [phone] Relation: Spouse Secondary Emergency Contact: freeman,stan Mobile Phone: [phone] Relation: Son  Code Status:  DNR Goals of care: Advanced Directive information    08/30/2024   11:49 AM  Advanced Directives  Does Patient Have a Medical Advance Directive? Yes  Type of Estate agent of [place];Out of facility DNR (pink MOST or yellow form)  Does patient want to make changes to medical advance directive? No - Patient declined  Copy of Healthcare Power of Attorney in Chart? Yes - validated most recent copy scanned in chart (See row information)     Chief Complaint  Patient presents with   Medical Management of Chronic Issues    Medical Management of Chronic Issues.     HPI:  Pt is a 89 y.o. female seen today for medical management of chronic diseases.    She currently resides on the skilled nursing unit at [facility]. PMH: HTN, diverticulitis with pericolonic abscess, gangrenous gallbladder s/p abdominal surgery 2014, goiter, hypothyroidism, OAB, dementia, insomnia, and gait abnormality.    HTN- BUN/creat 18/0.9 05/11/2024, remains on losartan  HLD- LDL 55, remains on rosuvastatin, ALT/AST 16/19 05/11/2024  Dementia- categorized by neurology  CNS degenerative disorder with vascular component, recent MoCA 11/30 03/02/2024, MMSE 15/30 07/29/2023, 04/2021 MRI brain showed moderate to severe atrophy and moderate ventriculometry/ moderate chronic small vessel ischemic disease, unsuccessful trial  of sinemet , remains on Namenda  Depression- Na+ 141 05/11/2024, no mood changes, continues to have flat affect, remains on Lexapro  OAB- remains on Myrbetriq Hypothyroidism- TSH 1.43 11/04/2023, remains on levothyroxine  Insomnia- remains on melatonin Vitamin D deficiency-  vitamin D level 42.7 04/2021, remains on vitamin D  Recent weights:  10/01- not listed  09/01- 141.4 lbs  08/01- 138 lbs  Recent blood pressures:  10/08- 127/74  10/07- 139/67  10/06- 142/82  Past Medical History:  Diagnosis Date   Anemia    Bowel obstruction (HCC)    Calculus of gallbladder with acute cholecystitis, without mention of obstruction 10/01/2013   Open chole on 10/26/13. 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    COPD (chronic obstructive pulmonary disease) (HCC)    Depression    sold beach house 01/2012 (10/25/2013)   Gall stones    Goiter    H/O sebaceous cyst    Hepatic cyst 10/01/2013   CT   History of diverticular abscess 09/2013   Hyperlipemia    Hypertension    was taking RX; took me off it 09/2013 (10/25/2013)   Hypothyroidism    goiter   Insomnia    MCI (mild cognitive impairment)    Osteopenia    Protein calorie malnutrition    malabsorption   Seasonal allergies    Vitamin B12 deficiency    Past Surgical History:  Procedure Laterality Date   ABCESS DRAINAGE  2014   had drain put in & stayed for ~ 1 wk to 10 day; just came out ~ 10 days ago (10/25/2013)   CATARACT EXTRACTION W/ INTRAOCULAR LENS IMPLANT Left ~ 2010   CHOLECYSTECTOMY N/A 10/26/2013  Procedure: ATTEMPTED  LAPAROSCOPIC CONVERTED TO OPEN CHOLECYSTECTOMY WITH INTRAOPERATIVE CHOLANGIOGRAM; REPAIR OF CHOLECOLONIC FISTULA; cholecystoduodenal fistula repair; lysis of adhesions; drainage of pelvic abscess; omental patching of duodenal repair;  Surgeon: Debby LABOR. Cornett, MD;  Location: [facility];  Service: General;  Laterality: N/A;   COLON RESECTION  2014    DILATION AND CURETTAGE OF UTERUS     EXCISION MASS NECK Left 10/31/2016   Procedure: EXCISION CYST LEFT NECK;  Surgeon: Debby Shipper, MD;  Location: [facility];  Service: General;  Laterality: Left;  EXCISION CYST LEFT NECK    Allergies  Allergen Reactions   Ambien  [Zolpidem  Tartrate] Other (See Comments)    Makes her unsteady   Cymbalta [Duloxetine Hcl] Other (See Comments)    kinda went nuts   Halcion [Triazolam] Other (See Comments)    kinda went nuts    Outpatient Encounter Medications as of 09/21/2024  Medication Sig   amLODipine (NORVASC) 2.5 MG tablet Take 2.5 mg by mouth daily.   ascorbic acid (VITAMIN C) 500 MG tablet Take 500 mg by mouth daily.   Cholecalciferol (VITAMIN D3) 50 MCG (2000 UT) TABS Take by mouth daily.   Cyanocobalamin  (VITAMIN B 12 PO) Take 2,000 mcg by mouth daily.   Emollient (EUCERIN ADVANCED REPAIR) CREA Apply 1 Application topically daily as needed.   escitalopram (LEXAPRO) 5 MG tablet Take 15 mg by mouth daily.   levothyroxine  (SYNTHROID ) 50 MCG tablet Take 50 mcg by mouth daily before breakfast.   losartan  (COZAAR ) 50 MG tablet Take 50 mg by mouth in the morning and at bedtime.   magnesium  hydroxide (MILK OF MAGNESIA) 400 MG/5ML suspension Take 30 mLs by mouth daily as needed for mild constipation.   Melatonin 5 MG CAPS Take 5 mg by mouth at bedtime.   memantine  (NAMENDA ) 5 MG tablet Take 2 tablets (10 mg total) by mouth 2 (two) times daily.   mirabegron ER (MYRBETRIQ) 25 MG TB24 tablet Take 25 mg by mouth daily.   Multiple Vitamins-Minerals (ABC COMPLETE SENIOR WOMENS 50+ PO) Take 1 tablet by mouth every morning.   ondansetron  (ZOFRAN ) 4 MG tablet Take 4 mg by mouth every 6 (six) hours as needed for nausea or vomiting.   rosuvastatin (CRESTOR) 10 MG tablet Take 10 mg by mouth daily.   triamcinolone cream (KENALOG) 0.1 % Apply 1 Application topically every 8 (eight) hours as needed.   multivitamin-lutein (OCUVITE-LUTEIN) CAPS  capsule Take 1 capsule by mouth daily. (Patient not taking: Reported on 09/21/2024)   No facility-administered encounter medications on file as of 09/21/2024.    Review of Systems  Unable to perform ROS: Dementia    Immunization History  Administered Date(s) Administered   Fluzone Influenza virus vaccine,trivalent (IIV3), split virus 11/22/2011   Influenza Inj Mdck Quad Pf 08/23/2016   Influenza Split 09/30/2013   Influenza, Mdck, Trivalent,PF 6+ MOS(egg free) 08/23/2016   Influenza, Quadrivalent, Recombinant, Inj, Pf 10/08/2019, 09/14/2020   Influenza,inj,Quad PF,6+ Mos 10/03/2013   Influenza-Unspecified 08/23/2016, 09/20/2022, 09/14/2024   MODERNA COVID-19 SARS-COV-2 PEDS BIVALENT BOOSTER 71yr-43yr 09/28/2021   Moderna SARS-COV2 Booster Vaccination 10/31/2020   Moderna Sars-Covid-2 Vaccination 12/28/2019, 01/26/2020, 10/21/2022   Pneumococcal Conjugate-13 01/09/2016   Pneumococcal Polysaccharide-23 08/12/2012, 10/03/2013   Td 08/16/2010   Td (Adult),5 Lf Tetanus Toxid, Preservative Free 08/16/2010   Tdap 01/16/2023   Unspecified SARS-COV-2 Vaccination 09/14/2024   Zoster Recombinant(Shingrix) 10/15/2022, 01/16/2023   Zoster, Live 10/07/2006   Pertinent  Health Maintenance Due  Topic Date Due   Influenza Vaccine  Completed   DEXA SCAN  Discontinued      11/03/2023    4:55 PM 12/02/2023    3:22 PM 03/19/2024   10:38 AM 04/06/2024    7:11 PM 08/03/2024    3:27 PM  Fall Risk  Falls in the past year? 0 1 1 1  0  Was there an injury with Fall? 0 0 0 0 0  Fall Risk Category Calculator 0 1 1 1  0  Patient at Risk for Falls Due to No Fall Risks History of fall(s);Impaired balance/gait;Impaired mobility History of fall(s);Impaired balance/gait History of fall(s);Impaired balance/gait History of fall(s);Impaired balance/gait  Fall risk Follow up Falls evaluation completed Falls evaluation completed;Education provided  Falls evaluation completed;Education provided Falls evaluation  completed;Education provided   Functional Status Survey:    Vitals:   09/21/24 1059  BP: 116/77  Pulse: 68  Resp: 20  Temp: (!) 97.5 F (36.4 C)  SpO2: 94%  Weight: 141 lb 6.4 oz (64.1 kg)  Height: 5' 4 (1.626 m)   Body mass index is 24.27 kg/m. Physical Exam Vitals reviewed.  Constitutional:      General: She is not in acute distress. HENT:     Head: Normocephalic.     Right Ear: There is no impacted cerumen.     Left Ear: There is no impacted cerumen.     Nose: Nose normal.     Mouth/Throat:     Mouth: Mucous membranes are moist.  Eyes:     General:        Right eye: No discharge.        Left eye: No discharge.  Cardiovascular:     Rate and Rhythm: Normal rate and regular rhythm.     Pulses: Normal pulses.     Heart sounds: Normal heart sounds.  Pulmonary:     Effort: Pulmonary effort is normal.     Breath sounds: Normal breath sounds.  Abdominal:     General: Bowel sounds are normal. There is no distension.     Palpations: Abdomen is soft.     Tenderness: There is no abdominal tenderness.  Musculoskeletal:     Cervical back: Neck supple.     Right lower leg: No edema.     Left lower leg: No edema.  Skin:    General: Skin is warm.     Capillary Refill: Capillary refill takes less than 2 seconds.  Neurological:     General: No focal deficit present.     Mental Status: She is alert and oriented to person, place, and time.     Gait: Gait abnormal.  Psychiatric:        Mood and Affect: Mood normal.     Labs reviewed: Recent Labs    09/23/23 0000 12/19/23 0000 05/11/24 0000  NA 138 140 141  K 4.1 4.1 4.4  CL 102 104 105  CO2 21 24* 24*  BUN 11 14 18   CREATININE 0.7 0.8 0.9  CALCIUM 9.5 9.2 9.1   Recent Labs    09/23/23 0000 05/11/24 0000  AST 23 19  ALT 12 16  ALKPHOS 82 74  ALBUMIN  4.0 3.7   Recent Labs    05/11/24 0000  WBC 7.9  HGB 14.2  HCT 42  PLT 247   Lab Results  Component Value Date   TSH 4.07 11/13/2022   No  results found for: HGBA1C Lab Results  Component Value Date   TRIG 142 11/04/2013    Significant Diagnostic Results in last 30  days:  No results found.  Assessment/Plan: 1. Essential hypertension, benign (Primary) - controlled with amlodipine and losartan   2. Mixed hyperlipidemia - cont rosuvastatin  3. Moderate vascular dementia with mood disturbance (HCC) - no behaviors - dependent with ADLs except feeding - weight stable - cont Namenda   4. Mild depression - stable mood - very supportive family - cont Lexapro  5. OAB (overactive bladder) - cont Myrbetriq  6. Acquired hypothyroidism - cont levothyroxine   7. Primary insomnia - cont melatonin  8. Vitamin D deficiency - cont vitamin D    Family/ staff Communication: plan discussed with nurse  Labs/tests ordered:  routine labs due 10/2024, recommend adding vitamin D level

## 2024-09-27 ENCOUNTER — Encounter: Payer: Self-pay | Admitting: Adult Health

## 2024-09-27 ENCOUNTER — Non-Acute Institutional Stay (SKILLED_NURSING_FACILITY): Payer: Self-pay | Admitting: Adult Health

## 2024-09-27 DIAGNOSIS — B9789 Other viral agents as the cause of diseases classified elsewhere: Secondary | ICD-10-CM | POA: Diagnosis not present

## 2024-09-27 DIAGNOSIS — J988 Other specified respiratory disorders: Secondary | ICD-10-CM | POA: Diagnosis not present

## 2024-09-27 NOTE — Progress Notes (Signed)
 Location:  [facility] Room Number: 131 P Place of Service:  SNF [phone]) Provider:  Tawni America, NP    Patient Care Team: Charlanne Fredia CROME, MD as PCP - General (Internal Medicine)  Extended Emergency Contact Information Primary Emergency Contact: Gudino,Wallace Address: [address]          [place], [place] [address] [place] Home Phone: [phone] Mobile Phone: [phone] Relation: Spouse Secondary Emergency Contact: freeman,stan Mobile Phone: [phone] Relation: Son  Code Status:  DNR Goals of care: Advanced Directive information    08/30/2024   11:49 AM  Advanced Directives  Does Patient Have a Medical Advance Directive? Yes  Type of Estate agent of [place];Out of facility DNR (pink MOST or yellow form)  Does patient want to make changes to medical advance directive? No - Patient declined  Copy of Healthcare Power of Attorney in Chart? Yes - validated most recent copy scanned in chart (See row information)     Chief Complaint  Patient presents with   Cough    HPI:  Pt is a 89 y.o. female seen today for an acute visit for a cough and congestion  Symptoms of cough and runny nose for two days Covid and flu swab negative CXR ordered 10/12 due to crackles per nurse on exam and cough which did not any acute process.   The nurse for the day shift reports she is improved with less cough. Appetite is adequate with no acute concerns.      Past Medical History:  Diagnosis Date   Anemia    Bowel obstruction (HCC)    Calculus of gallbladder with acute cholecystitis, without mention of obstruction 10/01/2013   Open chole on 10/26/13. 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    COPD (chronic obstructive pulmonary disease) (HCC)    Depression    sold beach house 01/2012 (10/25/2013)   Gall stones    Goiter    H/O sebaceous cyst     Hepatic cyst 10/01/2013   CT   History of diverticular abscess 09/2013   Hyperlipemia    Hypertension    was taking RX; took me off it 09/2013 (10/25/2013)   Hypothyroidism    goiter   Insomnia    MCI (mild cognitive impairment)    Osteopenia    Protein calorie malnutrition    malabsorption   Seasonal allergies    Vitamin B12 deficiency    Past Surgical History:  Procedure Laterality Date   ABCESS DRAINAGE  2014   had drain put in & stayed for ~ 1 wk to 10 day; just came out ~ 10 days ago (10/25/2013)   CATARACT EXTRACTION W/ INTRAOCULAR LENS IMPLANT Left ~ 2010   CHOLECYSTECTOMY N/A 10/26/2013   Procedure: ATTEMPTED  LAPAROSCOPIC CONVERTED TO OPEN CHOLECYSTECTOMY WITH INTRAOPERATIVE CHOLANGIOGRAM; REPAIR OF CHOLECOLONIC FISTULA; cholecystoduodenal fistula repair; lysis of adhesions; drainage of pelvic abscess; omental patching of duodenal repair;  Surgeon: Debby LABOR. Cornett, MD;  Location: [facility] OR;  Service: General;  Laterality: N/A;   COLON RESECTION  2014   DILATION AND CURETTAGE OF UTERUS     EXCISION MASS NECK Left 10/31/2016   Procedure: EXCISION CYST LEFT NECK;  Surgeon: Debby Shipper, MD;  Location: [facility];  Service: General;  Laterality: Left;  EXCISION CYST LEFT NECK    Allergies  Allergen Reactions   Ambien  [Zolpidem  Tartrate] Other (See Comments)    Makes her unsteady   Cymbalta [Duloxetine  Hcl] Other (See Comments)    kinda went nuts   Halcion [Triazolam] Other (See Comments)    kinda went nuts    Outpatient Encounter Medications as of 09/27/2024  Medication Sig   amLODipine (NORVASC) 2.5 MG tablet Take 2.5 mg by mouth daily.   ascorbic acid (VITAMIN C) 500 MG tablet Take 500 mg by mouth daily.   Cholecalciferol (VITAMIN D3) 50 MCG (2000 UT) TABS Take by mouth daily.   Cyanocobalamin  (VITAMIN B 12 PO) Take 2,000 mcg by mouth daily.   Emollient (EUCERIN ADVANCED REPAIR) CREA Apply 1 Application topically daily as needed.    escitalopram (LEXAPRO) 5 MG tablet Take 15 mg by mouth daily.   levothyroxine  (SYNTHROID ) 50 MCG tablet Take 50 mcg by mouth daily before breakfast.   losartan  (COZAAR ) 50 MG tablet Take 50 mg by mouth in the morning and at bedtime.   magnesium  hydroxide (MILK OF MAGNESIA) 400 MG/5ML suspension Take 30 mLs by mouth daily as needed for mild constipation.   Melatonin 5 MG CAPS Take 5 mg by mouth at bedtime.   memantine  (NAMENDA ) 5 MG tablet Take 2 tablets (10 mg total) by mouth 2 (two) times daily.   mirabegron ER (MYRBETRIQ) 25 MG TB24 tablet Take 25 mg by mouth daily.   Multiple Vitamins-Minerals (ABC COMPLETE SENIOR WOMENS 50+ PO) Take 1 tablet by mouth every morning.   ondansetron  (ZOFRAN ) 4 MG tablet Take 4 mg by mouth every 6 (six) hours as needed for nausea or vomiting.   rosuvastatin (CRESTOR) 10 MG tablet Take 10 mg by mouth daily.   triamcinolone cream (KENALOG) 0.1 % Apply 1 Application topically every 8 (eight) hours as needed.   No facility-administered encounter medications on file as of 09/27/2024.    Review of Systems  Unable to perform ROS: Dementia    Immunization History  Administered Date(s) Administered   Fluzone Influenza virus vaccine,trivalent (IIV3), split virus 11/22/2011   Influenza Inj Mdck Quad Pf 08/23/2016   Influenza Split 09/30/2013   Influenza, Mdck, Trivalent,PF 6+ MOS(egg free) 08/23/2016   Influenza, Quadrivalent, Recombinant, Inj, Pf 10/08/2019, 09/14/2020   Influenza,inj,Quad PF,6+ Mos 10/03/2013   Influenza-Unspecified 08/23/2016, 09/20/2022, 09/14/2024   MODERNA COVID-19 SARS-COV-2 PEDS BIVALENT BOOSTER 22yr-53yr 09/28/2021   Moderna SARS-COV2 Booster Vaccination 10/31/2020   Moderna Sars-Covid-2 Vaccination 12/28/2019, 01/26/2020, 10/21/2022   Pneumococcal Conjugate-13 01/09/2016   Pneumococcal Polysaccharide-23 08/12/2012, 10/03/2013   Td 08/16/2010   Td (Adult),5 Lf Tetanus Toxid, Preservative Free 08/16/2010   Tdap 01/16/2023   Unspecified  SARS-COV-2 Vaccination 09/14/2024   Zoster Recombinant(Shingrix) 10/15/2022, 01/16/2023   Zoster, Live 10/07/2006   Pertinent  Health Maintenance Due  Topic Date Due   Influenza Vaccine  Completed   DEXA SCAN  Discontinued      12/02/2023    3:22 PM 03/19/2024   10:38 AM 04/06/2024    7:11 PM 08/03/2024    3:27 PM 09/22/2024   11:59 AM  Fall Risk  Falls in the past year? 1 1 1  0 0  Was there an injury with Fall? 0 0 0 0 0  Fall Risk Category Calculator 1 1 1  0 0  Patient at Risk for Falls Due to History of fall(s);Impaired balance/gait;Impaired mobility History of fall(s);Impaired balance/gait History of fall(s);Impaired balance/gait History of fall(s);Impaired balance/gait History of fall(s);Impaired balance/gait  Fall risk Follow up Falls evaluation completed;Education provided  Falls evaluation completed;Education provided Falls evaluation completed;Education provided Falls evaluation completed   Functional Status Survey:    Vitals:   09/27/24 9141  BP: 107/70  Pulse: 85  Resp: 20  Temp: 98.8 F (37.1 C)  SpO2: 98%  Weight: 141 lb 6.4 oz (64.1 kg)  Height: 5' 4 (1.626 m)   Body mass index is 24.27 kg/m. Physical Exam Vitals and nursing note reviewed.  Constitutional:      Appearance: Normal appearance.  HENT:     Head: Normocephalic and atraumatic.     Right Ear: Tympanic membrane and ear canal normal.     Left Ear: Tympanic membrane and ear canal normal.     Nose: Congestion present.     Mouth/Throat:     Mouth: Mucous membranes are moist.     Pharynx: Oropharynx is clear. No oropharyngeal exudate or posterior oropharyngeal erythema.  Eyes:     Conjunctiva/sclera: Conjunctivae normal.     Pupils: Pupils are equal, round, and reactive to light.  Cardiovascular:     Rate and Rhythm: Normal rate and regular rhythm.     Heart sounds: No murmur heard. Pulmonary:     Effort: Pulmonary effort is normal. No respiratory distress.     Breath sounds: Normal breath  sounds.  Abdominal:     General: Bowel sounds are normal. There is no distension.     Palpations: Abdomen is soft.  Musculoskeletal:     Right lower leg: Edema (+1 pedal) present.     Left lower leg: No edema.  Skin:    General: Skin is warm and dry.  Neurological:     General: No focal deficit present.     Mental Status: She is alert. Mental status is at baseline.  Psychiatric:        Mood and Affect: Mood normal.     Labs reviewed: Recent Labs    12/19/23 0000 05/11/24 0000  NA 140 141  K 4.1 4.4  CL 104 105  CO2 24* 24*  BUN 14 18  CREATININE 0.8 0.9  CALCIUM 9.2 9.1   Recent Labs    05/11/24 0000  AST 19  ALT 16  ALKPHOS 74  ALBUMIN  3.7   Recent Labs    05/11/24 0000  WBC 7.9  HGB 14.2  HCT 42  PLT 247   Lab Results  Component Value Date   TSH 4.07 11/13/2022   No results found for: HGBA1C Lab Results  Component Value Date   TRIG 142 11/04/2013    Significant Diagnostic Results in last 30 days:  No results found.  Assessment/Plan  1. Viral respiratory illness (Primary) Upper resp Normal vitals Clear lungs Normal xray Neg for flu and covid Rest Fluids Prn robitussin if needed Let me know if she is not improving.

## 2024-10-04 ENCOUNTER — Encounter: Payer: Self-pay | Admitting: Adult Health

## 2024-10-04 ENCOUNTER — Non-Acute Institutional Stay (SKILLED_NURSING_FACILITY): Admitting: Adult Health

## 2024-10-04 DIAGNOSIS — R051 Acute cough: Secondary | ICD-10-CM | POA: Diagnosis not present

## 2024-10-04 DIAGNOSIS — R1111 Vomiting without nausea: Secondary | ICD-10-CM | POA: Diagnosis not present

## 2024-10-04 DIAGNOSIS — R531 Weakness: Secondary | ICD-10-CM | POA: Diagnosis not present

## 2024-10-04 LAB — HEPATIC FUNCTION PANEL
ALT: 15 U/L (ref 7–35)
AST: 21 (ref 13–35)
Alkaline Phosphatase: 78 (ref 25–125)
Bilirubin, Total: 0.3

## 2024-10-04 LAB — COMPREHENSIVE METABOLIC PANEL WITH GFR
Calcium: 9 (ref 8.7–10.7)
Globulin: 2
eGFR: 69

## 2024-10-04 LAB — BASIC METABOLIC PANEL WITH GFR
BUN: 16 (ref 4–21)
CO2: 21 (ref 13–22)
Chloride: 107 (ref 99–108)
Creatinine: 0.8 (ref 0.5–1.1)
Glucose: 142
Potassium: 3.9 meq/L (ref 3.5–5.1)
Sodium: 142 (ref 137–147)

## 2024-10-04 LAB — CBC AND DIFFERENTIAL
HCT: 41 (ref 36–46)
Hemoglobin: 14 (ref 12.0–16.0)
Platelets: 257 K/uL (ref 150–400)
WBC: 7.4

## 2024-10-04 LAB — CBC: RBC: 4.37 (ref 3.87–5.11)

## 2024-10-04 NOTE — Progress Notes (Addendum)
 Location:  [facility] Room Number: 131 P Place of Service:  SNF [phone]) Provider:  Tawni America, NP   Patient Care Team: Dawn Fredia CROME, MD as PCP - General (Internal Medicine)  Extended Emergency Contact Information Primary Emergency Contact: [name] Address: [address]          [place], [place] [address] [place] Home Phone: [phone] Mobile Phone: [phone] Relation: Spouse Secondary Emergency Contact: [name] Mobile Phone: [phone] Relation: Son  Code Status:  DNR Goals of care: Advanced Directive information    08/30/2024   11:49 AM  Advanced Directives  Does Patient Have a Medical Advance Directive? Yes  Type of Estate agent of [place];Out of facility DNR (pink MOST or yellow form)  Does patient want to make changes to medical advance directive? No - Patient declined  Copy of Healthcare Power of Attorney in Chart? Yes - validated most recent copy scanned in chart (See row information)     Chief Complaint  Patient presents with   Cough    HPI:  Pt is a 89 y.o. female seen today for an acute visit for a cough and vomiting episode.   She had an episode of vomiting after drinking a non alcoholic beverage. Then coughed and wheezed on 10/03/24.  Symptoms resolved on 10/04/24.  She ate breakfast and tolerated it well. No fever, cough, sore throat etc. No sob or wheeze. No known sick contacts.  Seems to be back to baseline.  Bowel movements recorded regularly per record.  No abd pain.    Past Medical History:  Diagnosis Date   Anemia    Bowel obstruction (HCC)    Calculus of gallbladder with acute cholecystitis, without mention of obstruction 10/01/2013   Open chole on 10/26/13. 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    COPD (chronic obstructive pulmonary disease) (HCC)    Depression    sold beach house  01/2012 (10/25/2013)   Gall stones    Goiter    H/O sebaceous cyst    Hepatic cyst 10/01/2013   CT   History of diverticular abscess 09/2013   Hyperlipemia    Hypertension    was taking RX; took me off it 09/2013 (10/25/2013)   Hypothyroidism    goiter   Insomnia    MCI (mild cognitive impairment)    Osteopenia    Protein calorie malnutrition    malabsorption   Seasonal allergies    Vitamin B12 deficiency    Past Surgical History:  Procedure Laterality Date   ABCESS DRAINAGE  2014   had drain put in & stayed for ~ 1 wk to 10 day; just came out ~ 10 days ago (10/25/2013)   CATARACT EXTRACTION W/ INTRAOCULAR LENS IMPLANT Left ~ 2010   CHOLECYSTECTOMY N/A 10/26/2013   Procedure: ATTEMPTED  LAPAROSCOPIC CONVERTED TO OPEN CHOLECYSTECTOMY WITH INTRAOPERATIVE CHOLANGIOGRAM; REPAIR OF CHOLECOLONIC FISTULA; cholecystoduodenal fistula repair; lysis of adhesions; drainage of pelvic abscess; omental patching of duodenal repair;  Surgeon: [name]. Cornett, MD;  Location: [facility] OR;  Service: General;  Laterality: N/A;   COLON RESECTION  2014   DILATION AND CURETTAGE OF UTERUS     EXCISION MASS NECK Left 10/31/2016   Procedure: EXCISION CYST LEFT NECK;  Surgeon: [name] Shipper, MD;  [name];  Service: General;  Laterality: Left;  EXCISION CYST LEFT NECK    Allergies  Allergen Reactions   Ambien  [Zolpidem  Tartrate] Other (See Comments)  Makes her unsteady   Cymbalta [Duloxetine Hcl] Other (See Comments)    kinda went nuts   Halcion [Triazolam] Other (See Comments)    kinda went nuts    Outpatient Encounter Medications as of 10/04/2024  Medication Sig   amLODipine (NORVASC) 2.5 MG tablet Take 2.5 mg by mouth daily.   ascorbic acid (VITAMIN C) 500 MG tablet Take 500 mg by mouth daily.   Cholecalciferol (VITAMIN D3) 50 MCG (2000 UT) TABS Take by mouth daily.   Cyanocobalamin  (VITAMIN B 12 PO) Take 2,000 mcg by mouth daily.   Emollient (EUCERIN  ADVANCED REPAIR) CREA Apply 1 Application topically daily as needed.   escitalopram (LEXAPRO) 5 MG tablet Take 15 mg by mouth daily.   levothyroxine  (SYNTHROID ) 50 MCG tablet Take 50 mcg by mouth daily before breakfast.   losartan  (COZAAR ) 50 MG tablet Take 50 mg by mouth in the morning and at bedtime.   magnesium  hydroxide (MILK OF MAGNESIA) 400 MG/5ML suspension Take 30 mLs by mouth daily as needed for mild constipation.   Melatonin 5 MG CAPS Take 5 mg by mouth at bedtime.   memantine  (NAMENDA ) 5 MG tablet Take 2 tablets (10 mg total) by mouth 2 (two) times daily.   mirabegron ER (MYRBETRIQ) 25 MG TB24 tablet Take 25 mg by mouth daily.   Multiple Vitamins-Minerals (ABC COMPLETE SENIOR WOMENS 50+ PO) Take 1 tablet by mouth every morning.   ondansetron  (ZOFRAN ) 4 MG tablet Take 4 mg by mouth every 6 (six) hours as needed for nausea or vomiting.   rosuvastatin (CRESTOR) 10 MG tablet Take 10 mg by mouth daily.   triamcinolone cream (KENALOG) 0.1 % Apply 1 Application topically every 8 (eight) hours as needed.   No facility-administered encounter medications on file as of 10/04/2024.    Review of Systems  Constitutional:  Negative for activity change, appetite change, chills, diaphoresis, fatigue and fever.  HENT:  Negative for congestion.   Respiratory:  Negative for cough, shortness of breath and wheezing.   Cardiovascular:  Negative for chest pain and leg swelling.  Gastrointestinal:  Positive for vomiting (resolved). Negative for abdominal distention, abdominal pain, constipation, diarrhea and nausea.  Genitourinary:  Negative for difficulty urinating, dysuria and urgency.  Musculoskeletal:  Positive for gait problem. Negative for back pain, myalgias and neck pain.  Skin:  Negative for rash.  Neurological:  Negative for dizziness and weakness.  Psychiatric/Behavioral:  Positive for confusion. Negative for agitation and behavioral problems.     Immunization History  Administered Date(s)  Administered   Fluzone Influenza virus vaccine,trivalent (IIV3), split virus 11/22/2011   Influenza Inj Mdck Quad Pf 08/23/2016   Influenza Split 09/30/2013   Influenza, Mdck, Trivalent,PF 6+ MOS(egg free) 08/23/2016   Influenza, Quadrivalent, Recombinant, Inj, Pf 10/08/2019, 09/14/2020   Influenza,inj,Quad PF,6+ Mos 10/03/2013   Influenza-Unspecified 08/23/2016, 09/20/2022, 09/14/2024   MODERNA COVID-19 SARS-COV-2 PEDS BIVALENT BOOSTER 105yr-80yr 09/28/2021   Moderna SARS-COV2 Booster Vaccination 10/31/2020   Moderna Sars-Covid-2 Vaccination 12/28/2019, 01/26/2020, 10/21/2022   Pneumococcal Conjugate-13 01/09/2016   Pneumococcal Polysaccharide-23 08/12/2012, 10/03/2013   Td 08/16/2010   Td (Adult),5 Lf Tetanus Toxid, Preservative Free 08/16/2010   Tdap 01/16/2023   Unspecified SARS-COV-2 Vaccination 09/14/2024   Zoster Recombinant(Shingrix) 10/15/2022, 01/16/2023   Zoster, Live 10/07/2006   Pertinent  Health Maintenance Due  Topic Date Due   Influenza Vaccine  Completed   DEXA SCAN  Discontinued      12/02/2023    3:22 PM 03/19/2024   10:38 AM 04/06/2024  7:11 PM 08/03/2024    3:27 PM 09/22/2024   11:59 AM  Fall Risk  Falls in the past year? 1 1 1  0 0  Was there an injury with Fall? 0 0 0 0 0  Fall Risk Category Calculator 1 1 1  0 0  Patient at Risk for Falls Due to History of fall(s);Impaired balance/gait;Impaired mobility History of fall(s);Impaired balance/gait History of fall(s);Impaired balance/gait History of fall(s);Impaired balance/gait History of fall(s);Impaired balance/gait  Fall risk Follow up Falls evaluation completed;Education provided  Falls evaluation completed;Education provided Falls evaluation completed;Education provided Falls evaluation completed   Functional Status Survey:    Vitals:   10/04/24 0930  BP: (!) 162/80  Pulse: 98  Temp: 98.3 F (36.8 C)  SpO2: 99%  Weight: 141 lb 3.2 oz (64 kg)  Height: 5' 4 (1.626 m)   Body mass index is 24.24  kg/m. Physical Exam Vitals and nursing note reviewed.  Constitutional:      General: She is not in acute distress.    Appearance: She is not diaphoretic.  HENT:     Head: Normocephalic and atraumatic.  Neck:     Vascular: No JVD.  Cardiovascular:     Rate and Rhythm: Normal rate and regular rhythm.     Heart sounds: No murmur heard. Pulmonary:     Effort: Pulmonary effort is normal. No respiratory distress.     Breath sounds: Normal breath sounds. No wheezing.  Abdominal:     General: Abdomen is flat. Bowel sounds are normal. There is no distension.     Palpations: Abdomen is soft.     Tenderness: There is no abdominal tenderness.  Musculoskeletal:     Right lower leg: No edema.     Left lower leg: No edema.  Skin:    General: Skin is warm and dry.  Neurological:     General: No focal deficit present.     Mental Status: She is alert. Mental status is at baseline.  Psychiatric:        Mood and Affect: Mood normal.     Labs reviewed: Recent Labs    12/19/23 0000 05/11/24 0000  NA 140 141  K 4.1 4.4  CL 104 105  CO2 24* 24*  BUN 14 18  CREATININE 0.8 0.9  CALCIUM 9.2 9.1   Recent Labs    05/11/24 0000  AST 19  ALT 16  ALKPHOS 74  ALBUMIN  3.7   Recent Labs    05/11/24 0000  WBC 7.9  HGB 14.2  HCT 42  PLT 247   Lab Results  Component Value Date   TSH 4.07 11/13/2022   No results found for: HGBA1C Lab Results  Component Value Date   TRIG 142 11/04/2013    Significant Diagnostic Results in last 30 days:  No results found.  Assessment/Plan  1. Acute cough (Primary) With associated wheezing which resolved Wrote an order if this re occurs to check CXR Rapid covid and flu negative.   2. Vomiting without nausea, unspecified vomiting type No further issues Will check labs, covid and flu swab.    Labs/tests ordered:  CBC CMP

## 2024-10-12 ENCOUNTER — Non-Acute Institutional Stay (SKILLED_NURSING_FACILITY): Payer: Self-pay | Admitting: Orthopedic Surgery

## 2024-10-12 ENCOUNTER — Encounter: Payer: Self-pay | Admitting: Orthopedic Surgery

## 2024-10-12 DIAGNOSIS — J3489 Other specified disorders of nose and nasal sinuses: Secondary | ICD-10-CM

## 2024-10-12 DIAGNOSIS — R051 Acute cough: Secondary | ICD-10-CM

## 2024-10-12 MED ORDER — CETIRIZINE HCL 10 MG PO TABS: 10.0000 mg | ORAL_TABLET | Freq: Every day | ORAL | Status: DC

## 2024-10-12 NOTE — Progress Notes (Signed)
 Location:  [facility] Room Number: 131/A Place of Service:  SNF [phone]) Provider:  Greig FORBES Cluster, NP   Charlanne Fredia CROME, MD  Patient Care Team: Charlanne Fredia CROME, MD as PCP - General (Internal Medicine)  Extended Emergency Contact Information Primary Emergency Contact: Paletta,Wallace Address: [address]          [place], [place] [address] [place] Home Phone: [phone] Mobile Phone: [phone] Relation: Spouse Secondary Emergency Contact: freeman,stan Mobile Phone: [phone] Relation: Son  Code Status:  DNR Goals of care: Advanced Directive information    08/30/2024   11:49 AM  Advanced Directives  Does Patient Have a Medical Advance Directive? Yes  Type of Estate Agent of [place];Out of facility DNR (pink MOST or yellow form)  Does patient want to make changes to medical advance directive? No - Patient declined  Copy of Healthcare Power of Attorney in Chart? Yes - validated most recent copy scanned in chart (See row information)     Chief Complaint  Patient presents with   Acute Visit    Ongoing cough, nasal congestion    HPI:  Pt is a 89 y.o. female seen today for medical management of chronic diseases.    She currently resides on the skilled nursing unit at [facility]. PMH: HTN, diverticulitis with pericolonic abscess, gangrenous gallbladder s/p abdominal surgery 2014, goiter, hypothyroidism, OAB, dementia, insomnia, and gait abnormality.   Poor historian due to dementia. 10/13 she had cough and runny nose x 2 days. Covid/flu negative. Robitussin prn prescribed. She then had episode of vomiting non alcoholic beverage. She had some wheezing for a brief period of time but it resolved. Repeat covid/flu negative. Today, nurse reports ongoing cough. She is also having nasal congestion. Robitussin prn has been given without any improvement. She denies chest pain or shortness of breath. Afebrile.  Vitals stable.        Past Medical History:  Diagnosis Date   Anemia    Bowel obstruction (HCC)    Calculus of gallbladder with acute cholecystitis, without mention of obstruction 10/01/2013   Open chole on 10/26/13. 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    COPD (chronic obstructive pulmonary disease) (HCC)    Depression    sold beach house 01/2012 (10/25/2013)   Gall stones    Goiter    H/O sebaceous cyst    Hepatic cyst 10/01/2013   CT   History of diverticular abscess 09/2013   Hyperlipemia    Hypertension    was taking RX; took me off it 09/2013 (10/25/2013)   Hypothyroidism    goiter   Insomnia    MCI (mild cognitive impairment)    Osteopenia    Protein calorie malnutrition    malabsorption   Seasonal allergies    Vitamin B12 deficiency    Past Surgical History:  Procedure Laterality Date   ABCESS DRAINAGE  2014   had drain put in & stayed for ~ 1 wk to 10 day; just came out ~ 10 days ago (10/25/2013)   CATARACT EXTRACTION W/ INTRAOCULAR LENS IMPLANT Left ~ 2010   CHOLECYSTECTOMY N/A 10/26/2013   Procedure: ATTEMPTED  LAPAROSCOPIC CONVERTED TO OPEN CHOLECYSTECTOMY WITH INTRAOPERATIVE CHOLANGIOGRAM; REPAIR OF CHOLECOLONIC FISTULA; cholecystoduodenal fistula repair; lysis of adhesions; drainage of pelvic abscess; omental patching of duodenal repair;  Surgeon: Debby LABOR. Cornett, MD;  Location: [facility] OR;  Service: General;  Laterality: N/A;   COLON RESECTION  2014   DILATION AND  CURETTAGE OF UTERUS     EXCISION MASS NECK Left 10/31/2016   Procedure: EXCISION CYST LEFT NECK;  Surgeon: Debby Shipper, MD;  Location: [facility];  Service: General;  Laterality: Left;  EXCISION CYST LEFT NECK    Allergies  Allergen Reactions   Ambien  [Zolpidem  Tartrate] Other (See Comments)    Makes her unsteady   Cymbalta [Duloxetine Hcl] Other (See Comments)    kinda went nuts   Halcion [Triazolam] Other  (See Comments)    kinda went nuts    Outpatient Encounter Medications as of 10/12/2024  Medication Sig   amLODipine (NORVASC) 2.5 MG tablet Take 2.5 mg by mouth daily.   ascorbic acid (VITAMIN C) 500 MG tablet Take 500 mg by mouth daily.   Cholecalciferol (VITAMIN D3) 50 MCG (2000 UT) TABS Take by mouth daily.   Cyanocobalamin  (VITAMIN B 12 PO) Take 2,000 mcg by mouth daily.   Emollient (EUCERIN ADVANCED REPAIR) CREA Apply 1 Application topically daily as needed.   escitalopram (LEXAPRO) 5 MG tablet Take 15 mg by mouth daily.   levothyroxine  (SYNTHROID ) 50 MCG tablet Take 50 mcg by mouth daily before breakfast.   losartan  (COZAAR ) 50 MG tablet Take 50 mg by mouth in the morning and at bedtime.   magnesium  hydroxide (MILK OF MAGNESIA) 400 MG/5ML suspension Take 30 mLs by mouth daily as needed for mild constipation.   Melatonin 5 MG CAPS Take 5 mg by mouth at bedtime.   memantine  (NAMENDA ) 5 MG tablet Take 2 tablets (10 mg total) by mouth 2 (two) times daily.   mirabegron ER (MYRBETRIQ) 25 MG TB24 tablet Take 25 mg by mouth daily.   Multiple Vitamins-Minerals (ABC COMPLETE SENIOR WOMENS 50+ PO) Take 1 tablet by mouth every morning.   ondansetron  (ZOFRAN ) 4 MG tablet Take 4 mg by mouth every 6 (six) hours as needed for nausea or vomiting.   rosuvastatin (CRESTOR) 10 MG tablet Take 10 mg by mouth daily.   triamcinolone cream (KENALOG) 0.1 % Apply 1 Application topically every 8 (eight) hours as needed.   No facility-administered encounter medications on file as of 10/12/2024.    Review of Systems  Unable to perform ROS: Dementia    Immunization History  Administered Date(s) Administered   Fluzone Influenza virus vaccine,trivalent (IIV3), split virus 11/22/2011   Influenza Inj Mdck Quad Pf 08/23/2016   Influenza Split 09/30/2013   Influenza, Mdck, Trivalent,PF 6+ MOS(egg free) 08/23/2016   Influenza, Quadrivalent, Recombinant, Inj, Pf 10/08/2019, 09/14/2020   Influenza,inj,Quad  PF,6+ Mos 10/03/2013   Influenza-Unspecified 08/23/2016, 09/20/2022, 09/14/2024   MODERNA COVID-19 SARS-COV-2 PEDS BIVALENT BOOSTER 25yr-61yr 09/28/2021   Moderna SARS-COV2 Booster Vaccination 10/31/2020   Moderna Sars-Covid-2 Vaccination 12/28/2019, 01/26/2020, 10/21/2022   Pneumococcal Conjugate-13 01/09/2016   Pneumococcal Polysaccharide-23 08/12/2012, 10/03/2013   Td 08/16/2010   Td (Adult),5 Lf Tetanus Toxid, Preservative Free 08/16/2010   Tdap 01/16/2023   Unspecified SARS-COV-2 Vaccination 09/14/2024   Zoster Recombinant(Shingrix) 10/15/2022, 01/16/2023   Zoster, Live 10/07/2006   Pertinent  Health Maintenance Due  Topic Date Due   Influenza Vaccine  Completed   DEXA SCAN  Discontinued      12/02/2023    3:22 PM 03/19/2024   10:38 AM 04/06/2024    7:11 PM 08/03/2024    3:27 PM 09/22/2024   11:59 AM  Fall Risk  Falls in the past year? 1 1 1  0 0  Was there an injury with Fall? 0 0 0 0 0  Fall Risk Category Calculator 1 1  1 0 0  Patient at Risk for Falls Due to History of fall(s);Impaired balance/gait;Impaired mobility History of fall(s);Impaired balance/gait History of fall(s);Impaired balance/gait History of fall(s);Impaired balance/gait History of fall(s);Impaired balance/gait  Fall risk Follow up Falls evaluation completed;Education provided  Falls evaluation completed;Education provided Falls evaluation completed;Education provided Falls evaluation completed   Functional Status Survey:    Vitals:   10/12/24 1450  BP: (!) 156/82  Pulse: 88  Resp: 14  Temp: (!) 97.5 F (36.4 C)  SpO2: 99%  Weight: 141 lb 3.2 oz (64 kg)  Height: 5' 4 (1.626 m)   Body mass index is 24.24 kg/m. Physical Exam Vitals reviewed.  Constitutional:      General: She is not in acute distress. HENT:     Head: Normocephalic.     Nose: Rhinorrhea present.     Right Turbinates: Enlarged.     Left Turbinates: Enlarged.     Mouth/Throat:     Pharynx: No posterior oropharyngeal erythema.   Eyes:     General:        Right eye: No discharge.        Left eye: No discharge.  Cardiovascular:     Rate and Rhythm: Normal rate and regular rhythm.     Pulses: Normal pulses.     Heart sounds: Normal heart sounds.  Pulmonary:     Effort: Pulmonary effort is normal. No respiratory distress.     Breath sounds: Normal breath sounds. No wheezing or rales.  Abdominal:     Palpations: Abdomen is soft.  Musculoskeletal:     Cervical back: Neck supple.  Lymphadenopathy:     Cervical: No cervical adenopathy.  Skin:    General: Skin is warm.     Capillary Refill: Capillary refill takes less than 2 seconds.  Neurological:     General: No focal deficit present.     Mental Status: She is alert. Mental status is at baseline.  Psychiatric:        Mood and Affect: Mood normal.     Labs reviewed: Recent Labs    12/19/23 0000 05/11/24 0000 10/04/24 0000 10/04/24 1047  NA 140 141 142  --   K 4.1 4.4 3.9  --   CL 104 105 107  --   CO2 24* 24* 21  --   BUN 14 18 16   --   CREATININE 0.8 0.9 0.8  --   CALCIUM 9.2 9.1  --  9.0   Recent Labs    05/11/24 0000 10/04/24 0000  AST 19 21  ALT 16 15  ALKPHOS 74 78  ALBUMIN  3.7  --    Recent Labs    05/11/24 0000 10/04/24 1048  WBC 7.9 7.4  HGB 14.2 14.0  HCT 42 41  PLT 247 257   Lab Results  Component Value Date   TSH 4.07 11/13/2022   No results found for: HGBA1C Lab Results  Component Value Date   TRIG 142 11/04/2013    Significant Diagnostic Results in last 30 days:  No results found.  Assessment/Plan 1. Acute cough (Primary) - ongoing - onset 10/13 - covid/flu negative x 2 - ? Rhinorrhea triggering cough reflex> trial Zyrtec  - repeat CXR if no improvement - consider PPI trial to r/o reflux - cont robitussin prn  2. Rhinorrhea - enlarged nasal turbinates, clear nasal drainage - see above - cetirizine (ZYRTEC) 10 MG tablet; Take 1 tablet (10 mg total) by mouth at bedtime for 14 days.    Family/  staff Communication: plan discussed with nurse  Labs/tests ordered:  none

## 2024-10-20 DIAGNOSIS — J9811 Atelectasis: Secondary | ICD-10-CM | POA: Diagnosis not present

## 2024-10-21 ENCOUNTER — Non-Acute Institutional Stay (SKILLED_NURSING_FACILITY): Payer: Self-pay | Admitting: Adult Health

## 2024-10-21 DIAGNOSIS — J0110 Acute frontal sinusitis, unspecified: Secondary | ICD-10-CM

## 2024-10-22 ENCOUNTER — Encounter: Payer: Self-pay | Admitting: Adult Health

## 2024-10-22 MED ORDER — DOXYCYCLINE HYCLATE 100 MG PO TABS: 100.0000 mg | ORAL_TABLET | Freq: Two times a day (BID) | ORAL | Status: DC

## 2024-10-22 NOTE — Progress Notes (Signed)
 Location:  [facility] of Service:  SNF (31) Provider:   Bari [place], ANP [facility] [phone]   Charlanne Fredia CROME, MD  Patient Care Team: Charlanne Fredia CROME, MD as PCP - General (Internal Medicine)  Extended Emergency Contact Information Primary Emergency Contact: Osley,Wallace Address: [address]          [place], [place] [address] [place] Home Phone: [phone] Mobile Phone: [phone] Relation: Spouse Secondary Emergency Contact: freeman,stan Mobile Phone: [phone] Relation: Son  Code Status:  DNR Goals of care: Advanced Directive information    08/30/2024   11:49 AM  Advanced Directives  Does Patient Have a Medical Advance Directive? Yes  Type of Estate Agent of [place];Out of facility DNR (pink MOST or yellow form)  Does patient want to make changes to medical advance directive? No - Patient declined  Copy of Healthcare Power of Attorney in Chart? Yes - validated most recent copy scanned in chart (See row information)     Chief Complaint  Patient presents with   Acute Visit    Runny nose and cough    HPI:  Pt is a 89 y.o. female seen today for an acute visit for runny nose and cough.  Symptoms present off and on for 3 weeks.  No fever or GI symptoms Rapid covid and flu negative x 2 Experienced shortness of breath on exertion CXR obtained 10/20/24 which was showed no acute process but low lung volumes Sats normal on RA with no acute distress Appetite adequate.  Sick contacts on her hall noted.    Past Medical History:  Diagnosis Date   Anemia    Bowel obstruction (HCC)    Calculus of gallbladder with acute cholecystitis, without mention of obstruction 10/01/2013   Open chole on 10/26/13. 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    COPD (chronic obstructive pulmonary disease) (HCC)    Depression     sold beach house 01/2012 (10/25/2013)   Gall stones    Goiter    H/O sebaceous cyst    Hepatic cyst 10/01/2013   CT   History of diverticular abscess 09/2013   Hyperlipemia    Hypertension    was taking RX; took me off it 09/2013 (10/25/2013)   Hypothyroidism    goiter   Insomnia    MCI (mild cognitive impairment)    Osteopenia    Protein calorie malnutrition    malabsorption   Seasonal allergies    Vitamin B12 deficiency    Past Surgical History:  Procedure Laterality Date   ABCESS DRAINAGE  2014   had drain put in & stayed for ~ 1 wk to 10 day; just came out ~ 10 days ago (10/25/2013)   CATARACT EXTRACTION W/ INTRAOCULAR LENS IMPLANT Left ~ 2010   CHOLECYSTECTOMY N/A 10/26/2013   Procedure: ATTEMPTED  LAPAROSCOPIC CONVERTED TO OPEN CHOLECYSTECTOMY WITH INTRAOPERATIVE CHOLANGIOGRAM; REPAIR OF CHOLECOLONIC FISTULA; cholecystoduodenal fistula repair; lysis of adhesions; drainage of pelvic abscess; omental patching of duodenal repair;  Surgeon: Debby LABOR. Cornett, MD;  Location: [facility] OR;  Service: General;  Laterality: N/A;   COLON RESECTION  2014   DILATION AND CURETTAGE OF UTERUS     EXCISION MASS NECK Left 10/31/2016   Procedure: EXCISION CYST LEFT NECK;  Surgeon: Debby Shipper, MD;  Location: [facility];  Service: General;  Laterality: Left;  EXCISION CYST LEFT NECK    Allergies  Allergen Reactions   Ambien  [  Zolpidem  Tartrate] Other (See Comments)    Makes her unsteady   Cymbalta [Duloxetine Hcl] Other (See Comments)    kinda went nuts   Halcion [Triazolam] Other (See Comments)    kinda went nuts    Outpatient Encounter Medications as of 10/21/2024  Medication Sig   doxycycline (VIBRA-TABS) 100 MG tablet Take 1 tablet (100 mg total) by mouth 2 (two) times daily for 7 days.   amLODipine (NORVASC) 2.5 MG tablet Take 2.5 mg by mouth daily.   ascorbic acid (VITAMIN C) 500 MG tablet Take 500 mg by mouth daily.   cetirizine (ZYRTEC) 10 MG tablet Take 1  tablet (10 mg total) by mouth at bedtime for 14 days.   Cholecalciferol (VITAMIN D3) 50 MCG (2000 UT) TABS Take by mouth daily.   Cyanocobalamin  (VITAMIN B 12 PO) Take 2,000 mcg by mouth daily.   Emollient (EUCERIN ADVANCED REPAIR) CREA Apply 1 Application topically daily as needed.   escitalopram (LEXAPRO) 5 MG tablet Take 15 mg by mouth daily.   levothyroxine  (SYNTHROID ) 50 MCG tablet Take 50 mcg by mouth daily before breakfast.   losartan  (COZAAR ) 50 MG tablet Take 50 mg by mouth in the morning and at bedtime.   magnesium  hydroxide (MILK OF MAGNESIA) 400 MG/5ML suspension Take 30 mLs by mouth daily as needed for mild constipation.   Melatonin 5 MG CAPS Take 5 mg by mouth at bedtime.   memantine  (NAMENDA ) 5 MG tablet Take 2 tablets (10 mg total) by mouth 2 (two) times daily.   mirabegron ER (MYRBETRIQ) 25 MG TB24 tablet Take 25 mg by mouth daily.   Multiple Vitamins-Minerals (ABC COMPLETE SENIOR WOMENS 50+ PO) Take 1 tablet by mouth every morning.   ondansetron  (ZOFRAN ) 4 MG tablet Take 4 mg by mouth every 6 (six) hours as needed for nausea or vomiting.   rosuvastatin (CRESTOR) 10 MG tablet Take 10 mg by mouth daily.   triamcinolone cream (KENALOG) 0.1 % Apply 1 Application topically every 8 (eight) hours as needed.   No facility-administered encounter medications on file as of 10/21/2024.    Review of Systems  Unable to perform ROS: Dementia    Immunization History  Administered Date(s) Administered   Fluzone Influenza virus vaccine,trivalent (IIV3), split virus 11/22/2011   Influenza Inj Mdck Quad Pf 08/23/2016   Influenza Split 09/30/2013   Influenza, Mdck, Trivalent,PF 6+ MOS(egg free) 08/23/2016   Influenza, Quadrivalent, Recombinant, Inj, Pf 10/08/2019, 09/14/2020   Influenza,inj,Quad PF,6+ Mos 10/03/2013   Influenza-Unspecified 08/23/2016, 09/20/2022, 09/14/2024   MODERNA COVID-19 SARS-COV-2 PEDS BIVALENT BOOSTER 44yr-13yr 09/28/2021   Moderna SARS-COV2 Booster Vaccination  10/31/2020   Moderna Sars-Covid-2 Vaccination 12/28/2019, 01/26/2020, 10/21/2022   Pneumococcal Conjugate-13 01/09/2016   Pneumococcal Polysaccharide-23 08/12/2012, 10/03/2013   Td 08/16/2010   Td (Adult),5 Lf Tetanus Toxid, Preservative Free 08/16/2010   Tdap 01/16/2023   Unspecified SARS-COV-2 Vaccination 09/14/2024   Zoster Recombinant(Shingrix) 10/15/2022, 01/16/2023   Zoster, Live 10/07/2006   Pertinent  Health Maintenance Due  Topic Date Due   Influenza Vaccine  Completed   DEXA SCAN  Discontinued      03/19/2024   10:38 AM 04/06/2024    7:11 PM 08/03/2024    3:27 PM 09/22/2024   11:59 AM 10/12/2024    3:08 PM  Fall Risk  Falls in the past year? 1 1 0 0 0  Was there an injury with Fall? 0 0 0 0 0  Fall Risk Category Calculator 1 1 0 0 0  Patient at Risk for Falls  Due to History of fall(s);Impaired balance/gait History of fall(s);Impaired balance/gait History of fall(s);Impaired balance/gait History of fall(s);Impaired balance/gait History of fall(s);Impaired balance/gait  Fall risk Follow up  Falls evaluation completed;Education provided Falls evaluation completed;Education provided Falls evaluation completed Falls evaluation completed   Functional Status Survey:    Vitals:   10/21/24 0710  BP: 120/68  Pulse: 71  Resp: 16  Temp: 98 F (36.7 C)  SpO2: 95%   There is no height or weight on file to calculate BMI. Physical Exam Vitals and nursing note reviewed.  Constitutional:      Appearance: Normal appearance.  HENT:     Head: Normocephalic and atraumatic.     Right Ear: Tympanic membrane normal.     Left Ear: Tympanic membrane normal.     Nose: Congestion and rhinorrhea present.     Mouth/Throat:     Mouth: Mucous membranes are moist.     Pharynx: Oropharynx is clear.  Eyes:     Conjunctiva/sclera: Conjunctivae normal.     Pupils: Pupils are equal, round, and reactive to light.  Cardiovascular:     Rate and Rhythm: Normal rate and regular rhythm.     Heart  sounds: No murmur heard. Pulmonary:     Effort: Pulmonary effort is normal. No respiratory distress.     Breath sounds: Normal breath sounds. No wheezing.  Abdominal:     General: Bowel sounds are normal. There is no distension.     Palpations: Abdomen is soft.     Tenderness: There is no abdominal tenderness.  Musculoskeletal:     Cervical back: No rigidity.     Right lower leg: Edema (pedal) present.     Left lower leg: Edema (pedal) present.  Lymphadenopathy:     Cervical: No cervical adenopathy.  Skin:    General: Skin is warm and dry.  Neurological:     General: No focal deficit present.     Mental Status: She is alert. Mental status is at baseline.  Psychiatric:        Mood and Affect: Mood normal.     Labs reviewed: Recent Labs    12/19/23 0000 05/11/24 0000 10/04/24 0000 10/04/24 1047  NA 140 141 142  --   K 4.1 4.4 3.9  --   CL 104 105 107  --   CO2 24* 24* 21  --   BUN 14 18 16   --   CREATININE 0.8 0.9 0.8  --   CALCIUM 9.2 9.1  --  9.0   Recent Labs    05/11/24 0000 10/04/24 0000  AST 19 21  ALT 16 15  ALKPHOS 74 78  ALBUMIN  3.7  --    Recent Labs    05/11/24 0000 10/04/24 1048  WBC 7.9 7.4  HGB 14.2 14.0  HCT 42 41  PLT 247 257   Lab Results  Component Value Date   TSH 4.07 11/13/2022   No results found for: HGBA1C Lab Results  Component Value Date   TRIG 142 11/04/2013    Significant Diagnostic Results in last 30 days:  No results found.  Assessment/Plan 1. Acute non-recurrent frontal sinusitis (Primary) Ongoing cough and congestion for 3 weeks Neg CXR Try doxycycline Continue prn robitussin Add prednisone  if not improving.   - doxycycline (VIBRA-TABS) 100 MG tablet; Take 1 tablet (100 mg total) by mouth 2 (two) times daily for 7 days.

## 2024-10-26 ENCOUNTER — Non-Acute Institutional Stay (SKILLED_NURSING_FACILITY): Payer: Self-pay | Admitting: Orthopedic Surgery

## 2024-10-26 ENCOUNTER — Encounter: Payer: Self-pay | Admitting: Orthopedic Surgery

## 2024-10-26 ENCOUNTER — Other Ambulatory Visit: Payer: Self-pay

## 2024-10-26 DIAGNOSIS — R1111 Vomiting without nausea: Secondary | ICD-10-CM | POA: Diagnosis not present

## 2024-10-26 DIAGNOSIS — R051 Acute cough: Secondary | ICD-10-CM

## 2024-10-26 DIAGNOSIS — J0110 Acute frontal sinusitis, unspecified: Secondary | ICD-10-CM

## 2024-10-26 DIAGNOSIS — R5383 Other fatigue: Secondary | ICD-10-CM | POA: Diagnosis not present

## 2024-10-26 DIAGNOSIS — R1110 Vomiting, unspecified: Secondary | ICD-10-CM | POA: Diagnosis not present

## 2024-10-26 DIAGNOSIS — F0390 Unspecified dementia without behavioral disturbance: Secondary | ICD-10-CM | POA: Diagnosis not present

## 2024-10-26 LAB — CBC: RBC: 4.28 (ref 3.87–5.11)

## 2024-10-26 LAB — HEPATIC FUNCTION PANEL
ALT: 9 U/L (ref 7–35)
AST: 15 (ref 13–35)
Bilirubin, Total: 0.4

## 2024-10-26 LAB — COMPREHENSIVE METABOLIC PANEL WITH GFR
Albumin: 3.5 (ref 3.5–5.0)
Calcium: 9 (ref 8.7–10.7)
eGFR: 55

## 2024-10-26 LAB — CBC AND DIFFERENTIAL
HCT: 40 (ref 36–46)
Hemoglobin: 13.1 (ref 12.0–16.0)
WBC: 13.9

## 2024-10-26 LAB — BASIC METABOLIC PANEL WITH GFR
BUN: 23 — AB (ref 4–21)
CO2: 26 — AB (ref 13–22)
Chloride: 105 (ref 99–108)
Creatinine: 1 (ref 0.5–1.1)
Glucose: 110
Potassium: 3.9 meq/L (ref 3.5–5.1)
Sodium: 141 (ref 137–147)

## 2024-10-26 MED ORDER — DOXYCYCLINE HYCLATE 100 MG PO TABS: 100.0000 mg | ORAL_TABLET | Freq: Two times a day (BID) | ORAL | Status: AC

## 2024-10-26 NOTE — Progress Notes (Signed)
 Location:  [facility] Room Number: 131 P Place of Service:  SNF (31) Provider:  Greig Cluster, NP   Patient Care Team: Charlanne Fredia CROME, MD as PCP - General (Internal Medicine)  Extended Emergency Contact Information Primary Emergency Contact: Schaus,Wallace Address: [address]          [place], [place] [address] [place] Home Phone: [phone] Mobile Phone: [phone] Relation: Spouse Secondary Emergency Contact: freeman,stan Mobile Phone: [phone] Relation: Son  Code Status:  DNR Goals of care: Advanced Directive information    08/30/2024   11:49 AM  Advanced Directives  Does Patient Have a Medical Advance Directive? Yes  Type of Estate Agent of [place];Out of facility DNR (pink MOST or yellow form)  Does patient want to make changes to medical advance directive? No - Patient declined  Copy of Healthcare Power of Attorney in Chart? Yes - validated most recent copy scanned in chart (See row information)     Chief Complaint  Patient presents with   Routine Visit    HPI:  Pt is a 89 y.o. female seen today for acute visit due to vomiting.   She currently resides on the skilled nursing unit at [facility]. PMH: HTN, diverticulitis with pericolonic abscess, gangrenous gallbladder s/p abdominal surgery 2014, goiter, hypothyroidism, OAB, dementia, insomnia, and gait abnormality.   Poor historian due to dementia. 10/13 she had cough and runny nose. Covid/flu negative. Symptoms improved for a brief period with Zyrtec. 11/05 CXR noted low lung volumes. She was started on doxycycline x 7 days. Last night she had 3 episodes of vomiting in the middle of the night. She did not eat breakfast. She was able to eat lunch > 50 %. Drinking fluids well per nursing. Afebrile. Vitals stable.     Past Medical History:  Diagnosis Date   Anemia    Bowel obstruction (HCC)    Calculus of gallbladder with acute  cholecystitis, without mention of obstruction 10/01/2013   Open chole on 10/26/13. 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    COPD (chronic obstructive pulmonary disease) (HCC)    Depression    sold beach house 01/2012 (10/25/2013)   Gall stones    Goiter    H/O sebaceous cyst    Hepatic cyst 10/01/2013   CT   History of diverticular abscess 09/2013   Hyperlipemia    Hypertension    was taking RX; took me off it 09/2013 (10/25/2013)   Hypothyroidism    goiter   Insomnia    MCI (mild cognitive impairment)    Osteopenia    Protein calorie malnutrition    malabsorption   Seasonal allergies    Vitamin B12 deficiency    Past Surgical History:  Procedure Laterality Date   ABCESS DRAINAGE  2014   had drain put in & stayed for ~ 1 wk to 10 day; just came out ~ 10 days ago (10/25/2013)   CATARACT EXTRACTION W/ INTRAOCULAR LENS IMPLANT Left ~ 2010   CHOLECYSTECTOMY N/A 10/26/2013   Procedure: ATTEMPTED  LAPAROSCOPIC CONVERTED TO OPEN CHOLECYSTECTOMY WITH INTRAOPERATIVE CHOLANGIOGRAM; REPAIR OF CHOLECOLONIC FISTULA; cholecystoduodenal fistula repair; lysis of adhesions; drainage of pelvic abscess; omental patching of duodenal repair;  Surgeon: Debby LABOR. Cornett, MD;  Location: [facility] OR;  Service: General;  Laterality: N/A;   COLON RESECTION  2014   DILATION AND CURETTAGE OF UTERUS     EXCISION MASS NECK Left 10/31/2016   Procedure: EXCISION CYST LEFT NECK;  Surgeon: Debby Shipper, MD;  Location: [facility];  Service: General;  Laterality: Left;  EXCISION CYST LEFT NECK    Allergies  Allergen Reactions   Ambien  [Zolpidem  Tartrate] Other (See Comments)    Makes her unsteady   Cymbalta [Duloxetine Hcl] Other (See Comments)    kinda went nuts   Halcion [Triazolam] Other (See Comments)    kinda went nuts    Outpatient Encounter Medications as of 10/26/2024  Medication Sig   amLODipine (NORVASC) 2.5 MG  tablet Take 2.5 mg by mouth daily.   ascorbic acid (VITAMIN C) 500 MG tablet Take 500 mg by mouth daily.   cetirizine (ZYRTEC) 10 MG tablet Take 1 tablet (10 mg total) by mouth at bedtime for 14 days.   Cholecalciferol (VITAMIN D3) 50 MCG (2000 UT) TABS Take by mouth daily.   Cyanocobalamin  (VITAMIN B 12 PO) Take 2,000 mcg by mouth daily.   doxycycline (VIBRA-TABS) 100 MG tablet Take 1 tablet (100 mg total) by mouth 2 (two) times daily for 7 days.   Emollient (EUCERIN ADVANCED REPAIR) CREA Apply 1 Application topically daily as needed.   escitalopram (LEXAPRO) 5 MG tablet Take 15 mg by mouth daily.   ipratropium-albuterol (DUONEB) 0.5-2.5 (3) MG/3ML SOLN Inhale 3 mLs into the lungs in the morning and at bedtime.  1 application inhale orally two times a day for shortness of breath until 10/26/2024 18:00 Duoneb BID x 7 days   levothyroxine  (SYNTHROID ) 50 MCG tablet Take 50 mcg by mouth daily before breakfast.   losartan  (COZAAR ) 50 MG tablet Take 50 mg by mouth in the morning and at bedtime.   magnesium  hydroxide (MILK OF MAGNESIA) 400 MG/5ML suspension Take 30 mLs by mouth daily as needed for mild constipation.   Melatonin 5 MG CAPS Take 5 mg by mouth at bedtime.   memantine  (NAMENDA ) 5 MG tablet Take 2 tablets (10 mg total) by mouth 2 (two) times daily.   mirabegron ER (MYRBETRIQ) 25 MG TB24 tablet Take 25 mg by mouth daily.   Multiple Vitamins-Minerals (ABC COMPLETE SENIOR WOMENS 50+ PO) Take 1 tablet by mouth every morning.   ondansetron  (ZOFRAN ) 4 MG tablet Take 4 mg by mouth every 6 (six) hours as needed for nausea or vomiting.   rosuvastatin (CRESTOR) 10 MG tablet Take 10 mg by mouth daily.   triamcinolone cream (KENALOG) 0.1 % Apply 1 Application topically every 8 (eight) hours as needed.   No facility-administered encounter medications on file as of 10/26/2024.    Review of Systems  Unable to perform ROS: Dementia    Immunization History  Administered Date(s) Administered    Fluzone Influenza virus vaccine,trivalent (IIV3), split virus 11/22/2011   Influenza Inj Mdck Quad Pf 08/23/2016   Influenza Split 09/30/2013   Influenza, Mdck, Trivalent,PF 6+ MOS(egg free) 08/23/2016   Influenza, Quadrivalent, Recombinant, Inj, Pf 10/08/2019, 09/14/2020   Influenza,inj,Quad PF,6+ Mos 10/03/2013   Influenza-Unspecified 08/23/2016, 09/20/2022, 09/14/2024   MODERNA COVID-19 SARS-COV-2 PEDS BIVALENT BOOSTER 49yr-39yr 09/28/2021   Moderna SARS-COV2 Booster Vaccination 10/31/2020   Moderna Sars-Covid-2 Vaccination 12/28/2019, 01/26/2020, 10/21/2022   Pneumococcal Conjugate-13 01/09/2016   Pneumococcal Polysaccharide-23 08/12/2012, 10/03/2013   Td 08/16/2010   Td (Adult),5 Lf Tetanus Toxid, Preservative Free 08/16/2010   Tdap 01/16/2023   Unspecified SARS-COV-2 Vaccination 09/14/2024   Zoster Recombinant(Shingrix) 10/15/2022, 01/16/2023   Zoster, Live 10/07/2006   Pertinent  Health Maintenance Due  Topic Date Due   Influenza Vaccine  Completed   DEXA SCAN  Discontinued  03/19/2024   10:38 AM 04/06/2024    7:11 PM 08/03/2024    3:27 PM 09/22/2024   11:59 AM 10/12/2024    3:08 PM  Fall Risk  Falls in the past year? 1 1 0 0 0  Was there an injury with Fall? 0 0 0 0 0  Fall Risk Category Calculator 1 1 0 0 0  Patient at Risk for Falls Due to History of fall(s);Impaired balance/gait History of fall(s);Impaired balance/gait History of fall(s);Impaired balance/gait History of fall(s);Impaired balance/gait History of fall(s);Impaired balance/gait  Fall risk Follow up  Falls evaluation completed;Education provided Falls evaluation completed;Education provided Falls evaluation completed Falls evaluation completed   Functional Status Survey:    Vitals:   10/26/24 0902  BP: 130/76  Pulse: 99  Resp: 15  Temp: 98.1 F (36.7 C)  SpO2: 96%  Weight: 142 lb 6.4 oz (64.6 kg)  Height: 5' 4 (1.626 m)   Body mass index is 24.44 kg/m. Physical Exam Vitals reviewed.   Constitutional:      General: She is not in acute distress. HENT:     Head: Normocephalic.     Nose: Nose normal.     Mouth/Throat:     Mouth: Mucous membranes are moist.  Eyes:     General:        Right eye: No discharge.        Left eye: No discharge.  Cardiovascular:     Rate and Rhythm: Normal rate and regular rhythm.     Pulses: Normal pulses.     Heart sounds: Normal heart sounds.  Pulmonary:     Effort: Pulmonary effort is normal. No respiratory distress.     Breath sounds: Rhonchi and rales present. No wheezing.  Abdominal:     General: Bowel sounds are normal. There is no distension.     Palpations: Abdomen is soft.     Tenderness: There is no abdominal tenderness. There is no guarding.  Musculoskeletal:     Cervical back: Neck supple.     Right lower leg: Edema present.     Left lower leg: Edema present.     Comments: Non pitting  Lymphadenopathy:     Cervical: No cervical adenopathy.  Skin:    General: Skin is warm.     Capillary Refill: Capillary refill takes less than 2 seconds.  Neurological:     General: No focal deficit present.     Mental Status: She is alert. Mental status is at baseline.     Gait: Gait abnormal.  Psychiatric:        Mood and Affect: Mood normal.     Comments: Alert to self/familiar face, follows some commands, gives short responses     Labs reviewed: Recent Labs    12/19/23 0000 05/11/24 0000 10/04/24 0000 10/04/24 1047  NA 140 141 142  --   K 4.1 4.4 3.9  --   CL 104 105 107  --   CO2 24* 24* 21  --   BUN 14 18 16   --   CREATININE 0.8 0.9 0.8  --   CALCIUM 9.2 9.1  --  9.0   Recent Labs    05/11/24 0000 10/04/24 0000  AST 19 21  ALT 16 15  ALKPHOS 74 78  ALBUMIN  3.7  --    Recent Labs    05/11/24 0000 10/04/24 1048  WBC 7.9 7.4  HGB 14.2 14.0  HCT 42 41  PLT 247 257   Lab Results  Component Value  Date   TSH 4.07 11/13/2022   No results found for: HGBA1C Lab Results  Component Value Date   TRIG  142 11/04/2013    Significant Diagnostic Results in last 30 days:  No results found.  Assessment/Plan 1. Vomiting without nausea, unspecified vomiting type (Primary) - noted 11/11 x 3 episodes - able to eat lunch, drinking fluids  - abdominal exam unremarkable - concerns for aspiration> extend doxy x 5 days - KUB - CMP  2. Acute cough - ongoing - 11/5 CXR noted low lung volumes - 11/5 doxycycline x 7 days started for sinusitis - rales on exam, non pitting edema, weight up 4 lbs in 2 months - ? HF versus infection  - wean off oxygen for sats> 90% - cont incentive spirometer - start furosemide 40 mg po daily x 2 days - BMP/BMP  3. Dementia without behavioral disturbance (HCC) - no behaviors - dependent with ADLs except feeding - 1+ assist with transfers - cont Namenda  and Lexapro   4. Acute non-recurrent frontal sinusitis - doxycycline (VIBRA-TABS) 100 MG tablet; Take 1 tablet (100 mg total) by mouth 2 (two) times daily for 12 days.    Family/ staff Communication: plan discussed with patient and nurse  Labs/tests ordered: KUB, cmp, BMP/BNP

## 2024-10-28 ENCOUNTER — Non-Acute Institutional Stay (SKILLED_NURSING_FACILITY): Payer: Self-pay | Admitting: Adult Health

## 2024-10-28 DIAGNOSIS — R7989 Other specified abnormal findings of blood chemistry: Secondary | ICD-10-CM

## 2024-10-28 DIAGNOSIS — T17908D Unspecified foreign body in respiratory tract, part unspecified causing other injury, subsequent encounter: Secondary | ICD-10-CM | POA: Diagnosis not present

## 2024-10-29 ENCOUNTER — Encounter: Payer: Self-pay | Admitting: Adult Health

## 2024-10-29 NOTE — Progress Notes (Signed)
 Location:  [facility] Service:  SNF (31) Provider:   Bari America, ANP [facility] [phone]   Charlanne Fredia CROME, MD  Patient Care Team: Charlanne Fredia CROME, MD as PCP - General (Internal Medicine)  Extended Emergency Contact Information Primary Emergency Contact: Chalupa,Wallace Address: [address]          [place], [place] [address] [place] Home Phone: [phone] Mobile Phone: [phone] Relation: Spouse Secondary Emergency Contact: freeman,stan Mobile Phone: [phone] Relation: Son  Code Status:  DNR Goals of care: Advanced Directive information    08/30/2024   11:49 AM  Advanced Directives  Does Patient Have a Medical Advance Directive? Yes  Type of Estate Agent of [place];Out of facility DNR (pink MOST or yellow form)  Does patient want to make changes to medical advance directive? No - Patient declined  Copy of Healthcare Power of Attorney in Chart? Yes - validated most recent copy scanned in chart (See row information)     Chief Complaint  Patient presents with   Acute Visit    F/u labs    HPI:  Pt is a 89 y.o. female seen today for an acute visit for f/u labs  Resides in skilled care with underlying hx of dementia.  She had a cough and nasal congestion on and off for three weeks. Prescribed doxycycline on 10/21/24  CXR obtained 10/20/24 which was showed no acute process but low lung volumes Neg covid and flu Did have sick contacts on her unit   Seen on 10/26/24 for vomiting. Had been taking doxycycline and the nurse reports she had a large meal.  Afterwards she appeared ill and did not eat lunch. Sats was 89% and oxygen was applied. There was concern for aspiration and doxycycline course was extended for 5 days  KUB returned normal without obstruction or constipation   Now sats are normal on RA Eating and drinking has improved BNP was ordered and was 161 She  was given lasix No further sob at this time.  Bp stable   Past Medical History:  Diagnosis Date   Anemia    Bowel obstruction (HCC)    Calculus of gallbladder with acute cholecystitis, without mention of obstruction 10/01/2013   Open chole on 10/26/13. 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    COPD (chronic obstructive pulmonary disease) (HCC)    Depression    sold beach house 01/2012 (10/25/2013)   Gall stones    Goiter    H/O sebaceous cyst    Hepatic cyst 10/01/2013   CT   History of diverticular abscess 09/2013   Hyperlipemia    Hypertension    was taking RX; took me off it 09/2013 (10/25/2013)   Hypothyroidism    goiter   Insomnia    MCI (mild cognitive impairment)    Osteopenia    Protein calorie malnutrition    malabsorption   Seasonal allergies    Vitamin B12 deficiency    Past Surgical History:  Procedure Laterality Date   ABCESS DRAINAGE  2014   had drain put in & stayed for ~ 1 wk to 10 day; just came out ~ 10 days ago (10/25/2013)   CATARACT EXTRACTION W/ INTRAOCULAR LENS IMPLANT Left ~ 2010   CHOLECYSTECTOMY N/A 10/26/2013   Procedure: ATTEMPTED  LAPAROSCOPIC CONVERTED TO OPEN CHOLECYSTECTOMY WITH INTRAOPERATIVE CHOLANGIOGRAM; REPAIR OF CHOLECOLONIC FISTULA; cholecystoduodenal fistula repair; lysis of adhesions; drainage of pelvic abscess; omental patching  of duodenal repair;  Surgeon: Debby LABOR. Cornett, MD;  Location: [facility] OR;  Service: General;  Laterality: N/A;   COLON RESECTION  2014   DILATION AND CURETTAGE OF UTERUS     EXCISION MASS NECK Left 10/31/2016   Procedure: EXCISION CYST LEFT NECK;  Surgeon: Debby Shipper, MD;  Location: [facility];  Service: General;  Laterality: Left;  EXCISION CYST LEFT NECK    Allergies  Allergen Reactions   Ambien  [Zolpidem  Tartrate] Other (See Comments)    Makes her unsteady   Cymbalta [Duloxetine Hcl] Other (See Comments)    kinda went  nuts   Halcion [Triazolam] Other (See Comments)    kinda went nuts    Outpatient Encounter Medications as of 10/28/2024  Medication Sig   amLODipine (NORVASC) 2.5 MG tablet Take 2.5 mg by mouth daily.   ascorbic acid (VITAMIN C) 500 MG tablet Take 500 mg by mouth daily.   cetirizine (ZYRTEC) 10 MG tablet Take 1 tablet (10 mg total) by mouth at bedtime for 14 days.   Cholecalciferol (VITAMIN D3) 50 MCG (2000 UT) TABS Take by mouth daily.   Cyanocobalamin  (VITAMIN B 12 PO) Take 2,000 mcg by mouth daily.   doxycycline (VIBRA-TABS) 100 MG tablet Take 1 tablet (100 mg total) by mouth 2 (two) times daily for 12 days.   Emollient (EUCERIN ADVANCED REPAIR) CREA Apply 1 Application topically daily as needed.   escitalopram (LEXAPRO) 5 MG tablet Take 15 mg by mouth daily.   ipratropium-albuterol (DUONEB) 0.5-2.5 (3) MG/3ML SOLN Inhale 3 mLs into the lungs in the morning and at bedtime.  1 application inhale orally two times a day for shortness of breath until 10/26/2024 18:00 Duoneb BID x 7 days   levothyroxine  (SYNTHROID ) 50 MCG tablet Take 50 mcg by mouth daily before breakfast.   losartan  (COZAAR ) 50 MG tablet Take 50 mg by mouth in the morning and at bedtime.   magnesium  hydroxide (MILK OF MAGNESIA) 400 MG/5ML suspension Take 30 mLs by mouth daily as needed for mild constipation.   Melatonin 5 MG CAPS Take 5 mg by mouth at bedtime.   memantine  (NAMENDA ) 5 MG tablet Take 2 tablets (10 mg total) by mouth 2 (two) times daily.   mirabegron ER (MYRBETRIQ) 25 MG TB24 tablet Take 25 mg by mouth daily.   Multiple Vitamins-Minerals (ABC COMPLETE SENIOR WOMENS 50+ PO) Take 1 tablet by mouth every morning.   ondansetron  (ZOFRAN ) 4 MG tablet Take 4 mg by mouth every 6 (six) hours as needed for nausea or vomiting.   rosuvastatin (CRESTOR) 10 MG tablet Take 10 mg by mouth daily.   triamcinolone cream (KENALOG) 0.1 % Apply 1 Application topically every 8 (eight) hours as needed.   No facility-administered  encounter medications on file as of 10/28/2024.    Review of Systems  Constitutional:  Positive for activity change. Negative for appetite change, chills, diaphoresis, fatigue and fever.  HENT:  Positive for congestion.   Respiratory:  Positive for cough. Negative for shortness of breath and wheezing.   Cardiovascular:  Positive for leg swelling (trace). Negative for chest pain.  Gastrointestinal:  Negative for abdominal distention, abdominal pain, constipation, diarrhea, nausea and vomiting.  Genitourinary:  Negative for difficulty urinating, dysuria and urgency.  Musculoskeletal:  Positive for gait problem. Negative for back pain, myalgias and neck pain.  Skin:  Negative for rash.  Neurological:  Negative for dizziness and weakness.  Psychiatric/Behavioral:  Positive for confusion. Negative for behavioral problems.  Immunization History  Administered Date(s) Administered   Fluzone Influenza virus vaccine,trivalent (IIV3), split virus 11/22/2011   Influenza Inj Mdck Quad Pf 08/23/2016   Influenza Split 09/30/2013   Influenza, Mdck, Trivalent,PF 6+ MOS(egg free) 08/23/2016   Influenza, Quadrivalent, Recombinant, Inj, Pf 10/08/2019, 09/14/2020   Influenza,inj,Quad PF,6+ Mos 10/03/2013   Influenza-Unspecified 08/23/2016, 09/20/2022, 09/14/2024   MODERNA COVID-19 SARS-COV-2 PEDS BIVALENT BOOSTER 59yr-25yr 09/28/2021   Moderna SARS-COV2 Booster Vaccination 10/31/2020   Moderna Sars-Covid-2 Vaccination 12/28/2019, 01/26/2020, 10/21/2022   Pneumococcal Conjugate-13 01/09/2016   Pneumococcal Polysaccharide-23 08/12/2012, 10/03/2013   Td 08/16/2010   Td (Adult),5 Lf Tetanus Toxid, Preservative Free 08/16/2010   Tdap 01/16/2023   Unspecified SARS-COV-2 Vaccination 09/14/2024   Zoster Recombinant(Shingrix) 10/15/2022, 01/16/2023   Zoster, Live 10/07/2006   Pertinent  Health Maintenance Due  Topic Date Due   Influenza Vaccine  Completed   DEXA SCAN  Discontinued      03/19/2024    10:38 AM 04/06/2024    7:11 PM 08/03/2024    3:27 PM 09/22/2024   11:59 AM 10/12/2024    3:08 PM  Fall Risk  Falls in the past year? 1 1 0 0 0  Was there an injury with Fall? 0 0 0 0 0  Fall Risk Category Calculator 1 1 0 0 0  Patient at Risk for Falls Due to History of fall(s);Impaired balance/gait History of fall(s);Impaired balance/gait History of fall(s);Impaired balance/gait History of fall(s);Impaired balance/gait History of fall(s);Impaired balance/gait  Fall risk Follow up  Falls evaluation completed;Education provided Falls evaluation completed;Education provided Falls evaluation completed Falls evaluation completed   Functional Status Survey:    Vitals:   10/29/24 1312  BP: 124/67  Pulse: 70  Resp: 20  Temp: 98.1 F (36.7 C)  SpO2: 98%   There is no height or weight on file to calculate BMI. Physical Exam Vitals and nursing note reviewed.  Constitutional:      General: She is not in acute distress.    Appearance: She is not diaphoretic.  HENT:     Head: Normocephalic and atraumatic.     Nose: Nose normal.     Mouth/Throat:     Mouth: Mucous membranes are moist.     Pharynx: Oropharynx is clear.  Eyes:     Conjunctiva/sclera: Conjunctivae normal.     Pupils: Pupils are equal, round, and reactive to light.  Neck:     Vascular: No JVD.  Cardiovascular:     Rate and Rhythm: Normal rate and regular rhythm.     Heart sounds: No murmur heard. Pulmonary:     Effort: Pulmonary effort is normal. No respiratory distress.     Breath sounds: Rales present. No wheezing.  Abdominal:     General: Abdomen is flat. Bowel sounds are normal. There is no distension.     Palpations: Abdomen is soft.     Tenderness: There is no abdominal tenderness.  Musculoskeletal:     Comments: Trace edema bilat  Lymphadenopathy:     Cervical: Cervical adenopathy present.  Skin:    General: Skin is warm and dry.  Neurological:     Mental Status: She is alert.     Labs  reviewed: Recent Labs    05/11/24 0000 10/04/24 0000 10/04/24 1047 10/26/24 0000 10/26/24 1516  NA 141 142  --   --  141  K 4.4 3.9  --   --  3.9  CL 105 107  --   --  105  CO2 24* 21  --   --  26*  BUN 18 16  --   --  23*  CREATININE 0.9 0.8  --   --  1.0  CALCIUM 9.1  --  9.0 9.0  --    Recent Labs    05/11/24 0000 10/04/24 0000 10/26/24 0000 10/26/24 1516  AST 19 21  --  15  ALT 16 15  --  9  ALKPHOS 74 78  --   --   ALBUMIN  3.7  --  3.5  --    Recent Labs    05/11/24 0000 10/04/24 1048 10/26/24 1516  WBC 7.9 7.4 13.9  HGB 14.2 14.0 13.1  HCT 42 41 40  PLT 247 257  --    Lab Results  Component Value Date   TSH 4.07 11/13/2022   No results found for: HGBA1C Lab Results  Component Value Date   TRIG 142 11/04/2013    Significant Diagnostic Results in last 30 days:  No results found.  Assessment/Plan  1. Aspiration into airway, subsequent encounter (Primary) Possible aspiration of vomit into the airway On room air Mild crackles in bases of both lungs Doxycycline was extended for 5 more days to complete on 11/16 Reduced cough no fever Feels better  2. Elevated brain natriuretic peptide (BNP) level 161 with no hx of CHF Mild elevation in the setting of an acute episode Did receive lasix Noted, will monitor  3. Weakness Improving  Labs/tests ordered:  called and discussed labs results with her daughter Nirali Magouirk

## 2024-11-22 ENCOUNTER — Non-Acute Institutional Stay (SKILLED_NURSING_FACILITY): Payer: Self-pay | Admitting: Internal Medicine

## 2024-11-22 ENCOUNTER — Encounter: Payer: Self-pay | Admitting: Internal Medicine

## 2024-11-22 DIAGNOSIS — F5101 Primary insomnia: Secondary | ICD-10-CM

## 2024-11-22 DIAGNOSIS — E039 Hypothyroidism, unspecified: Secondary | ICD-10-CM

## 2024-11-22 DIAGNOSIS — E782 Mixed hyperlipidemia: Secondary | ICD-10-CM

## 2024-11-22 DIAGNOSIS — F32A Depression, unspecified: Secondary | ICD-10-CM | POA: Diagnosis not present

## 2024-11-22 DIAGNOSIS — N3281 Overactive bladder: Secondary | ICD-10-CM | POA: Diagnosis not present

## 2024-11-22 DIAGNOSIS — F01B3 Vascular dementia, moderate, with mood disturbance: Secondary | ICD-10-CM | POA: Diagnosis not present

## 2024-11-22 DIAGNOSIS — I10 Essential (primary) hypertension: Secondary | ICD-10-CM

## 2024-11-22 NOTE — Progress Notes (Unsigned)
 Location:  [facility] of Service:  SNF (31)  Provider:   Code Status: DNR Goals of Care:     08/30/2024   11:49 AM  Advanced Directives  Does Patient Have a Medical Advance Directive? Yes  Type of Estate Agent of [place];Out of facility DNR (pink MOST or yellow form)  Does patient want to make changes to medical advance directive? No - Patient declined  Copy of Healthcare Power of Attorney in Chart? Yes - validated most recent copy scanned in chart (See row information)     Chief Complaint  Patient presents with  . Care Management    HPI: Patient is a 89 y.o. female seen today for medical management of chronic diseases.    Lives in Skilled in [place]   Patient has a history of dementia  Recent MMSE score 11/30 in Neurology Office  MRI of the brain has showed generalized atrophy ventricular megaly  Depression, Hypothyroidism, HLD   Patient was treated with URI and Possible aspiration after Episode of Vomiting in Nov.  She has recovered from that No Cough or SOB  She is stable. No new Nursing issues. No Behavior issues Her weight is stable Walks with her walker No Falls Wt Readings from Last 3 Encounters:  11/22/24 138 lb 11.2 oz (62.9 kg)  10/26/24 142 lb 6.4 oz (64.6 kg)  10/12/24 141 lb 3.2 oz (64 kg)    Past Medical History:  Diagnosis Date  . Anemia   . Bowel obstruction (HCC)   . Calculus of gallbladder with acute cholecystitis, without mention of obstruction 10/01/2013   Open chole on 10/26/13. 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 COPD (chronic obstructive pulmonary disease) (HCC)   . Depression    sold beach house 01/2012 (10/25/2013)  . Gall stones   . Goiter   . H/O sebaceous cyst   . Hepatic cyst 10/01/2013   CT  . History of diverticular abscess 09/2013  . Hyperlipemia   . Hypertension    was taking RX; took me off it 09/2013  (10/25/2013)  . Hypothyroidism    goiter  . Insomnia   . MCI (mild cognitive impairment)   . Osteopenia   . Protein calorie malnutrition    malabsorption  . Seasonal allergies   . Vitamin B12 deficiency     Past Surgical History:  Procedure Laterality Date  . ABCESS DRAINAGE  2014   had drain put in & stayed for ~ 1 wk to 10 day; just came out ~ 10 days ago (10/25/2013)  . CATARACT EXTRACTION W/ INTRAOCULAR LENS IMPLANT Left ~ 2010  . CHOLECYSTECTOMY N/A 10/26/2013   Procedure: ATTEMPTED  LAPAROSCOPIC CONVERTED TO OPEN CHOLECYSTECTOMY WITH INTRAOPERATIVE CHOLANGIOGRAM; REPAIR OF CHOLECOLONIC FISTULA; cholecystoduodenal fistula repair; lysis of adhesions; drainage of pelvic abscess; omental patching of duodenal repair;  Surgeon: Debby LABOR. Cornett, MD;  Location: [facility] OR;  Service: General;  Laterality: N/A;  . COLON RESECTION  2014  . DILATION AND CURETTAGE OF UTERUS    . EXCISION MASS NECK Left 10/31/2016   Procedure: EXCISION CYST LEFT NECK;  Surgeon: Debby Shipper, MD;  Location: [facility];  Service: General;  Laterality: Left;  EXCISION CYST LEFT NECK    Allergies  Allergen Reactions  . Ambien  [Zolpidem  Tartrate] Other (See Comments)    Makes her unsteady  . Cymbalta [Duloxetine Hcl] Other (See Comments)    kinda went nuts  . Halcion [Triazolam]  Other (See Comments)    kinda went nuts    Outpatient Encounter Medications as of 11/22/2024  Medication Sig  . amLODipine (NORVASC) 2.5 MG tablet Take 2.5 mg by mouth daily.  SABRA ascorbic acid (VITAMIN C) 500 MG tablet Take 500 mg by mouth daily.  . cetirizine  (ZYRTEC ) 10 MG tablet Take 1 tablet (10 mg total) by mouth at bedtime for 14 days.  . Cholecalciferol (VITAMIN D3) 50 MCG (2000 UT) TABS Take by mouth daily.  . Cyanocobalamin  (VITAMIN B 12 PO) Take 2,000 mcg by mouth daily.  . Emollient (EUCERIN ADVANCED REPAIR) CREA Apply 1 Application topically daily as needed.  SABRA escitalopram (LEXAPRO) 5 MG tablet  Take 15 mg by mouth daily.  SABRA ipratropium-albuterol (DUONEB) 0.5-2.5 (3) MG/3ML SOLN Inhale 3 mLs into the lungs in the morning and at bedtime.  1 application inhale orally two times a day for shortness of breath until 10/26/2024 18:00 Duoneb BID x 7 days  . levothyroxine  (SYNTHROID ) 50 MCG tablet Take 50 mcg by mouth daily before breakfast.  . losartan  (COZAAR ) 50 MG tablet Take 50 mg by mouth in the morning and at bedtime.  . magnesium  hydroxide (MILK OF MAGNESIA) 400 MG/5ML suspension Take 30 mLs by mouth daily as needed for mild constipation.  . Melatonin 5 MG CAPS Take 5 mg by mouth at bedtime.  . memantine  (NAMENDA ) 5 MG tablet Take 2 tablets (10 mg total) by mouth 2 (two) times daily.  . mirabegron ER (MYRBETRIQ) 25 MG TB24 tablet Take 25 mg by mouth daily.  . Multiple Vitamins-Minerals (ABC COMPLETE SENIOR WOMENS 50+ PO) Take 1 tablet by mouth every morning.  . ondansetron  (ZOFRAN ) 4 MG tablet Take 4 mg by mouth every 6 (six) hours as needed for nausea or vomiting.  . rosuvastatin (CRESTOR) 10 MG tablet Take 10 mg by mouth daily.  SABRA triamcinolone cream (KENALOG) 0.1 % Apply 1 Application topically every 8 (eight) hours as needed.   No facility-administered encounter medications on file as of 11/22/2024.    Review of Systems:  Review of Systems  Unable to perform ROS: Dementia    Health Maintenance  Topic Date Due  . COVID-19 Vaccine (6 - Moderna risk 2025-26 season) 03/14/2025  . Medicare Annual Wellness (AWV)  03/19/2025  . DTaP/Tdap/Td (4 - Td or Tdap) 01/16/2033  . Pneumococcal Vaccine: 50+ Years  Completed  . Influenza Vaccine  Completed  . Zoster Vaccines- Shingrix  Completed  . Meningococcal B Vaccine  Aged Out  . Bone Density Scan  Discontinued    Physical Exam: Vitals:   11/22/24 1254  BP: 125/72  Pulse: 82  Resp: 18  Temp: (!) 97.5 F (36.4 C)  Weight: 138 lb 11.2 oz (62.9 kg)   Body mass index is 23.81 kg/m. Physical Exam Vitals reviewed.   Constitutional:      Appearance: Normal appearance.  HENT:     Head: Normocephalic.     Nose: Nose normal.     Mouth/Throat:     Mouth: Mucous membranes are moist.     Pharynx: Oropharynx is clear.  Eyes:     Pupils: Pupils are equal, round, and reactive to light.  Cardiovascular:     Rate and Rhythm: Normal rate and regular rhythm.     Pulses: Normal pulses.     Heart sounds: Normal heart sounds. No murmur heard. Pulmonary:     Effort: Pulmonary effort is normal.     Breath sounds: Normal breath sounds.  Abdominal:  General: Abdomen is flat. Bowel sounds are normal.     Palpations: Abdomen is soft.  Musculoskeletal:        General: No swelling.     Cervical back: Neck supple.  Skin:    General: Skin is warm.  Neurological:     General: No focal deficit present.     Mental Status: She is alert.  Psychiatric:        Mood and Affect: Mood normal.        Thought Content: Thought content normal.     Labs reviewed: Basic Metabolic Panel: Recent Labs    05/11/24 0000 10/04/24 0000 10/04/24 1047 10/26/24 0000 10/26/24 1516  NA 141 142  --   --  141  K 4.4 3.9  --   --  3.9  CL 105 107  --   --  105  CO2 24* 21  --   --  26*  BUN 18 16  --   --  23*  CREATININE 0.9 0.8  --   --  1.0  CALCIUM 9.1  --  9.0 9.0  --    Liver Function Tests: Recent Labs    05/11/24 0000 10/04/24 0000 10/26/24 0000 10/26/24 1516  AST 19 21  --  15  ALT 16 15  --  9  ALKPHOS 74 78  --   --   ALBUMIN  3.7  --  3.5  --    No results for input(s): LIPASE, AMYLASE in the last 8760 hours. No results for input(s): AMMONIA in the last 8760 hours. CBC: Recent Labs    05/11/24 0000 10/04/24 1048 10/26/24 1516  WBC 7.9 7.4 13.9  HGB 14.2 14.0 13.1  HCT 42 41 40  PLT 247 257  --    Lipid Panel: No results for input(s): CHOL, HDL, LDLCALC, TRIG, CHOLHDL, LDLDIRECT in the last 8760 hours. No results found for: HGBA1C  Procedures since last visit: No results  found.  Assessment/Plan 1. Essential hypertension, benign (Primary) Norvasc Losartan   2. Mixed hyperlipidemia On statin  3. Mild depression ***  4. OAB (overactive bladder) ***  5. Acquired hypothyroidism ***  6. Moderate vascular dementia with mood disturbance (HCC) ***    Labs/tests ordered:  * No order type specified * Next appt:  Visit date not found

## 2024-12-30 ENCOUNTER — Encounter: Payer: Self-pay | Admitting: Adult Health

## 2024-12-30 ENCOUNTER — Non-Acute Institutional Stay (SKILLED_NURSING_FACILITY): Payer: Self-pay | Admitting: Adult Health

## 2024-12-30 DIAGNOSIS — N3281 Overactive bladder: Secondary | ICD-10-CM | POA: Diagnosis not present

## 2024-12-30 DIAGNOSIS — F01B3 Vascular dementia, moderate, with mood disturbance: Secondary | ICD-10-CM | POA: Diagnosis not present

## 2024-12-30 DIAGNOSIS — I10 Essential (primary) hypertension: Secondary | ICD-10-CM

## 2024-12-30 DIAGNOSIS — E782 Mixed hyperlipidemia: Secondary | ICD-10-CM

## 2024-12-30 DIAGNOSIS — E559 Vitamin D deficiency, unspecified: Secondary | ICD-10-CM | POA: Diagnosis not present

## 2024-12-30 DIAGNOSIS — E039 Hypothyroidism, unspecified: Secondary | ICD-10-CM

## 2024-12-30 MED ORDER — CHLORHEXIDINE GLUCONATE 0.12 % MT SOLN: 15.0000 mL | Freq: Every day | OROMUCOSAL | Status: AC

## 2024-12-30 NOTE — Assessment & Plan Note (Signed)
 Doing well in skilled care

## 2024-12-30 NOTE — Assessment & Plan Note (Signed)
 On supplementation, need Vit D level when next blood draw done

## 2024-12-30 NOTE — Progress Notes (Addendum)
 " Location:  Medical Illustrator of Service:  SNF (31) Provider:   Bari [place], ANP [facility] [phone]   Charlanne Fredia CROME, MD  Patient Care Team: Charlanne Fredia CROME, MD as PCP - General (Internal Medicine)  Extended Emergency Contact Information Primary Emergency Contact: Kimura,Wallace Address: [address]          [place], [place] [address] [place] Home Phone: [phone] Mobile Phone: [phone] Relation: Spouse Secondary Emergency Contact: freeman,stan Mobile Phone: [phone] Relation: Son  Code Status:  DNR Goals of care: Advanced Directive information    08/30/2024   11:49 AM  Advanced Directives  Does Patient Have a Medical Advance Directive? Yes  Type of Estate Agent of [place];Out of facility DNR (pink MOST or yellow form)  Does patient want to make changes to medical advance directive? No - Patient declined  Copy of Healthcare Power of Attorney in Chart? Yes - validated most recent copy scanned in chart (See row information)     Chief Complaint  Patient presents with   Medical Management of Chronic Issues    HPI:  Pt is a 89 y.o. female seen today for medical management of chronic diseases.    Treated for pneumonia with doxycycline  in November which resolved.   Resides in skilled care with progressive dementia  MMSE 11/30 MRI of the brain showed generalized atrophy and ventricular enlargement Minimal speech Follows commands, short shuffling gait, mostly sedentary No falls has a walker.   Hypothyroidism TSH 2.44 11/24/24  HLD on crestor LDL 55 05/11/24  HTN Controlled   Hx of vit d def Hx of vit b12 def on supplementation for both  Depression on lexapro, flat affect chronically due to dementia. No signs of depression Weight stable Wt Readings from Last 3 Encounters:  12/30/24 142 lb 9.6 oz (64.7 kg)  11/22/24 138 lb 11.2 oz (62.9 kg)  10/26/24 142 lb 6.4 oz  (64.6 kg)    OAB on myrbetriq  Past Medical History:  Diagnosis Date   Anemia    Bowel obstruction (HCC)    Calculus of gallbladder with acute cholecystitis, without mention of obstruction 10/01/2013   Open chole on 10/26/13. 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    COPD (chronic obstructive pulmonary disease) (HCC)    Depression    sold beach house 01/2012 (10/25/2013)   Gall stones    Goiter    H/O sebaceous cyst    Hepatic cyst 10/01/2013   CT   History of diverticular abscess 09/2013   Hyperlipemia    Hypertension    was taking RX; took me off it 09/2013 (10/25/2013)   Hypothyroidism    goiter   Insomnia    MCI (mild cognitive impairment)    Osteopenia    Protein calorie malnutrition    malabsorption   Seasonal allergies    Vitamin B12 deficiency    Past Surgical History:  Procedure Laterality Date   ABCESS DRAINAGE  2014   had drain put in & stayed for ~ 1 wk to 10 day; just came out ~ 10 days ago (10/25/2013)   CATARACT EXTRACTION W/ INTRAOCULAR LENS IMPLANT Left ~ 2010   CHOLECYSTECTOMY N/A 10/26/2013   Procedure: ATTEMPTED  LAPAROSCOPIC CONVERTED TO OPEN CHOLECYSTECTOMY WITH INTRAOPERATIVE CHOLANGIOGRAM; REPAIR OF CHOLECOLONIC FISTULA; cholecystoduodenal fistula repair; lysis of adhesions; drainage of pelvic abscess; omental patching of duodenal repair;  Surgeon: Debby LABOR. Cornett, MD;  Location: [facility] OR;  Service: General;  Laterality: N/A;   COLON RESECTION  2014   DILATION AND CURETTAGE OF UTERUS     EXCISION MASS NECK Left 10/31/2016   Procedure: EXCISION CYST LEFT NECK;  Surgeon: Debby Shipper, MD;  Location: [facility];  Service: General;  Laterality: Left;  EXCISION CYST LEFT NECK    Allergies[1]  Outpatient Encounter Medications as of 12/30/2024  Medication Sig   amLODipine (NORVASC) 2.5 MG tablet Take 2.5 mg by mouth daily.   ascorbic acid (VITAMIN C) 500 MG tablet Take  500 mg by mouth daily.   chlorhexidine  (PERIDEX ) 0.12 % solution Use as directed 15 mLs in the mouth or throat at bedtime.   Cholecalciferol (VITAMIN D3) 50 MCG (2000 UT) TABS Take by mouth daily.   Cyanocobalamin  (VITAMIN B 12 PO) Take 2,000 mcg by mouth daily.   Emollient (EUCERIN ADVANCED REPAIR) CREA Apply 1 Application topically daily as needed.   escitalopram (LEXAPRO) 5 MG tablet Take 15 mg by mouth daily.   levothyroxine  (SYNTHROID ) 50 MCG tablet Take 50 mcg by mouth daily before breakfast.   losartan  (COZAAR ) 50 MG tablet Take 50 mg by mouth in the morning and at bedtime.   Melatonin 5 MG CAPS Take 5 mg by mouth at bedtime.   memantine  (NAMENDA ) 5 MG tablet Take 2 tablets (10 mg total) by mouth 2 (two) times daily.   mirabegron ER (MYRBETRIQ) 25 MG TB24 tablet Take 25 mg by mouth daily.   Multiple Vitamins-Minerals (ABC COMPLETE SENIOR WOMENS 50+ PO) Take 1 tablet by mouth every morning.   ondansetron  (ZOFRAN ) 4 MG tablet Take 4 mg by mouth every 6 (six) hours as needed for nausea or vomiting.   rosuvastatin (CRESTOR) 10 MG tablet Take 10 mg by mouth daily.   triamcinolone cream (KENALOG) 0.1 % Apply 1 Application topically every 8 (eight) hours as needed.   magnesium  hydroxide (MILK OF MAGNESIA) 400 MG/5ML suspension Take 30 mLs by mouth daily as needed for mild constipation.   [DISCONTINUED] cetirizine  (ZYRTEC ) 10 MG tablet Take 1 tablet (10 mg total) by mouth at bedtime for 14 days.   [DISCONTINUED] ipratropium-albuterol (DUONEB) 0.5-2.5 (3) MG/3ML SOLN Inhale 3 mLs into the lungs in the morning and at bedtime.  1 application inhale orally two times a day for shortness of breath until 10/26/2024 18:00 Duoneb BID x 7 days   No facility-administered encounter medications on file as of 12/30/2024.    Review of Systems  Unable to perform ROS: Dementia    Immunization History  Administered Date(s) Administered   Fluzone Influenza virus vaccine,trivalent (IIV3), split virus  11/22/2011   Influenza Inj Mdck Quad Pf 08/23/2016   Influenza Split 09/30/2013   Influenza, Mdck, Trivalent,PF 6+ MOS(egg free) 08/23/2016   Influenza, Quadrivalent, Recombinant, Inj, Pf 10/08/2019, 09/14/2020   Influenza,inj,Quad PF,6+ Mos 10/03/2013   Influenza-Unspecified 08/23/2016, 09/20/2022, 09/14/2024   MODERNA COVID-19 SARS-COV-2 PEDS BIVALENT BOOSTER 77yr-48yr 09/28/2021   Moderna SARS-COV2 Booster Vaccination 10/31/2020   Moderna Sars-Covid-2 Vaccination 12/28/2019, 01/26/2020, 10/21/2022   Pneumococcal Conjugate-13 01/09/2016   Pneumococcal Polysaccharide-23 08/12/2012, 10/03/2013   Td 08/16/2010   Td (Adult),5 Lf Tetanus Toxid, Preservative Free 08/16/2010   Tdap 01/16/2023   Unspecified SARS-COV-2 Vaccination 09/14/2024   Zoster Recombinant(Shingrix) 10/15/2022, 01/16/2023   Zoster, Live 10/07/2006   Pertinent  Health Maintenance Due  Topic Date Due   Influenza Vaccine  Completed   Bone Density Scan  Discontinued      03/19/2024   10:38 AM 04/06/2024  7:11 PM 08/03/2024    3:27 PM 09/22/2024   11:59 AM 10/12/2024    3:08 PM  Fall Risk  Falls in the past year? 1 1 0 0 0  Was there an injury with Fall? 0  0  0  0  0   Fall Risk Category Calculator 1 1 0 0 0  Patient at Risk for Falls Due to History of fall(s);Impaired balance/gait History of fall(s);Impaired balance/gait History of fall(s);Impaired balance/gait History of fall(s);Impaired balance/gait History of fall(s);Impaired balance/gait  Fall risk Follow up  Falls evaluation completed;Education provided Falls evaluation completed;Education provided Falls evaluation completed Falls evaluation completed     Data saved with a previous flowsheet row definition   Functional Status Survey:    Vitals:   12/30/24 1635  BP: 127/75  Pulse: 80  Resp: 16  Temp: 98.1 F (36.7 C)  SpO2: 90%  Weight: 142 lb 9.6 oz (64.7 kg)   Body mass index is 24.48 kg/m. Physical Exam Vitals and nursing note reviewed.   Constitutional:      General: She is not in acute distress.    Appearance: She is not diaphoretic.  HENT:     Head: Normocephalic and atraumatic.  Neck:     Vascular: No JVD.  Cardiovascular:     Rate and Rhythm: Normal rate and regular rhythm.     Heart sounds: No murmur heard. Pulmonary:     Effort: Pulmonary effort is normal. No respiratory distress.     Breath sounds: Normal breath sounds. No wheezing.  Abdominal:     General: Bowel sounds are normal. There is no distension.     Palpations: Abdomen is soft.     Tenderness: There is no abdominal tenderness.  Musculoskeletal:     Comments: Edema BLE non pitting pedal  Skin:    General: Skin is warm and dry.  Neurological:     General: No focal deficit present.     Mental Status: She is alert. Mental status is at baseline.  Psychiatric:        Mood and Affect: Mood normal.     Labs reviewed: Recent Labs    05/11/24 0000 10/04/24 0000 10/04/24 1047 10/26/24 0000 10/26/24 1516  NA 141 142  --   --  141  K 4.4 3.9  --   --  3.9  CL 105 107  --   --  105  CO2 24* 21  --   --  26*  BUN 18 16  --   --  23*  CREATININE 0.9 0.8  --   --  1.0  CALCIUM 9.1  --  9.0 9.0  --    Recent Labs    05/11/24 0000 10/04/24 0000 10/26/24 0000 10/26/24 1516  AST 19 21  --  15  ALT 16 15  --  9  ALKPHOS 74 78  --   --   ALBUMIN  3.7  --  3.5  --    Recent Labs    05/11/24 0000 10/04/24 1048 10/26/24 1516  WBC 7.9 7.4 13.9  HGB 14.2 14.0 13.1  HCT 42 41 40  PLT 247 257  --    Lab Results  Component Value Date   TSH 4.07 11/13/2022   No results found for: HGBA1C Lab Results  Component Value Date   TRIG 142 11/04/2013    Significant Diagnostic Results in last 30 days:  No results found.  Assessment/Plan  Moderate dementia (HCC) Doing well in skilled care  Overactive  bladder Continue myrbetriq  Essential hypertension, benign Controlled Continue losartan   Vitamin D deficiency On supplementation,  need Vit D level when next blood draw done  Hypothyroidism Continue levothyroxine   Mixed hyperlipidemia Continue crestor    Myrbetriq was discontinued due to cost per pharmacy notification but resumed to complete supply until exhausted.     [1]  Allergies Allergen Reactions   Ambien  [Zolpidem  Tartrate] Other (See Comments)    Makes her unsteady   Cymbalta [Duloxetine Hcl] Other (See Comments)    kinda went nuts   Halcion [Triazolam] Other (See Comments)    kinda went nuts   "

## 2024-12-30 NOTE — Assessment & Plan Note (Signed)
"  Controlled.  Continue losartan   "

## 2024-12-30 NOTE — Assessment & Plan Note (Signed)
 Continue crestor 

## 2024-12-30 NOTE — Assessment & Plan Note (Signed)
 Continue levothyroxine 

## 2024-12-30 NOTE — Assessment & Plan Note (Signed)
Continue myrbetriq 

## 2025-01-03 NOTE — Progress Notes (Signed)
 This encounter was created in error - please disregard.
